# Patient Record
Sex: Female | Born: 1980 | Race: White | Hispanic: No | Smoking: Former smoker
Health system: Southern US, Community
[De-identification: ages and names within clinical notes are randomized; demographics above are authoritative.]

## PROBLEM LIST (undated history)

## (undated) DIAGNOSIS — N289 Disorder of kidney and ureter, unspecified: Secondary | ICD-10-CM

## (undated) DIAGNOSIS — J189 Pneumonia, unspecified organism: Secondary | ICD-10-CM

## (undated) DIAGNOSIS — K219 Gastro-esophageal reflux disease without esophagitis: Secondary | ICD-10-CM

## (undated) DIAGNOSIS — F329 Major depressive disorder, single episode, unspecified: Secondary | ICD-10-CM

## (undated) DIAGNOSIS — F32A Depression, unspecified: Secondary | ICD-10-CM

## (undated) DIAGNOSIS — F410 Panic disorder [episodic paroxysmal anxiety] without agoraphobia: Secondary | ICD-10-CM

## (undated) DIAGNOSIS — K5900 Constipation, unspecified: Secondary | ICD-10-CM

## (undated) DIAGNOSIS — M1990 Unspecified osteoarthritis, unspecified site: Secondary | ICD-10-CM

## (undated) DIAGNOSIS — T7840XA Allergy, unspecified, initial encounter: Secondary | ICD-10-CM

## (undated) DIAGNOSIS — Z87442 Personal history of urinary calculi: Secondary | ICD-10-CM

## (undated) DIAGNOSIS — J302 Other seasonal allergic rhinitis: Secondary | ICD-10-CM

## (undated) HISTORY — DX: Major depressive disorder, single episode, unspecified: F32.9

## (undated) HISTORY — PX: UPPER GI ENDOSCOPY: SHX6162

## (undated) HISTORY — PX: ABDOMINAL HYSTERECTOMY: SHX81

## (undated) HISTORY — PX: OTHER SURGICAL HISTORY: SHX169

## (undated) HISTORY — PX: KNEE ARTHROSCOPY: SHX127

## (undated) HISTORY — DX: Allergy, unspecified, initial encounter: T78.40XA

## (undated) HISTORY — PX: KIDNEY STONE SURGERY: SHX686

## (undated) HISTORY — PX: TONSILLECTOMY: SUR1361

## (undated) HISTORY — PX: TONSILLECTOMY AND ADENOIDECTOMY: SHX28

## (undated) HISTORY — PX: DILATION AND CURETTAGE OF UTERUS: SHX78

## (undated) HISTORY — PX: PARTIAL HYSTERECTOMY: SHX80

## (undated) HISTORY — PX: BREAST CYST EXCISION: SHX579

## (undated) HISTORY — PX: COLONOSCOPY: SHX174

## (undated) HISTORY — DX: Constipation, unspecified: K59.00

## (undated) HISTORY — DX: Gastro-esophageal reflux disease without esophagitis: K21.9

## (undated) HISTORY — DX: Depression, unspecified: F32.A

## (undated) HISTORY — PX: TUBAL LIGATION: SHX77

---
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
## 2002-09-26 ENCOUNTER — Other Ambulatory Visit: Admission: RE | Admit: 2002-09-26 | Discharge: 2002-09-26 | Payer: Self-pay | Admitting: Obstetrics and Gynecology

## 2002-10-04 ENCOUNTER — Encounter: Payer: Self-pay | Admitting: Orthopedic Surgery

## 2002-10-04 ENCOUNTER — Ambulatory Visit (HOSPITAL_COMMUNITY): Admission: RE | Admit: 2002-10-04 | Discharge: 2002-10-04 | Payer: Self-pay | Admitting: Orthopedic Surgery

## 2002-10-21 ENCOUNTER — Ambulatory Visit (HOSPITAL_COMMUNITY): Admission: RE | Admit: 2002-10-21 | Discharge: 2002-10-21 | Payer: Self-pay | Admitting: Obstetrics and Gynecology

## 2002-10-21 ENCOUNTER — Encounter: Payer: Self-pay | Admitting: Obstetrics and Gynecology

## 2002-10-24 ENCOUNTER — Ambulatory Visit (HOSPITAL_COMMUNITY): Admission: RE | Admit: 2002-10-24 | Discharge: 2002-10-24 | Payer: Self-pay | Admitting: Obstetrics and Gynecology

## 2002-10-24 ENCOUNTER — Encounter: Payer: Self-pay | Admitting: Obstetrics and Gynecology

## 2002-11-10 ENCOUNTER — Ambulatory Visit (HOSPITAL_BASED_OUTPATIENT_CLINIC_OR_DEPARTMENT_OTHER): Admission: RE | Admit: 2002-11-10 | Discharge: 2002-11-10 | Payer: Self-pay | Admitting: Urology

## 2002-11-10 ENCOUNTER — Encounter: Payer: Self-pay | Admitting: Urology

## 2003-05-05 ENCOUNTER — Inpatient Hospital Stay (HOSPITAL_COMMUNITY): Admission: AD | Admit: 2003-05-05 | Discharge: 2003-05-05 | Payer: Self-pay | Admitting: Family Medicine

## 2003-05-08 ENCOUNTER — Inpatient Hospital Stay (HOSPITAL_COMMUNITY): Admission: AD | Admit: 2003-05-08 | Discharge: 2003-05-08 | Payer: Self-pay

## 2003-05-10 ENCOUNTER — Inpatient Hospital Stay (HOSPITAL_COMMUNITY): Admission: AD | Admit: 2003-05-10 | Discharge: 2003-05-10 | Payer: Self-pay | Admitting: Obstetrics and Gynecology

## 2003-06-29 ENCOUNTER — Ambulatory Visit (HOSPITAL_COMMUNITY): Admission: RE | Admit: 2003-06-29 | Discharge: 2003-06-29 | Payer: Self-pay | Admitting: Obstetrics and Gynecology

## 2003-08-10 ENCOUNTER — Ambulatory Visit (HOSPITAL_COMMUNITY): Admission: RE | Admit: 2003-08-10 | Discharge: 2003-08-10 | Payer: Self-pay | Admitting: Obstetrics and Gynecology

## 2003-10-06 ENCOUNTER — Ambulatory Visit (HOSPITAL_COMMUNITY): Admission: RE | Admit: 2003-10-06 | Discharge: 2003-10-06 | Payer: Self-pay | Admitting: Obstetrics and Gynecology

## 2003-10-09 ENCOUNTER — Inpatient Hospital Stay (HOSPITAL_COMMUNITY): Admission: AD | Admit: 2003-10-09 | Discharge: 2003-10-09 | Payer: Self-pay | Admitting: Obstetrics and Gynecology

## 2003-10-24 ENCOUNTER — Encounter: Admission: RE | Admit: 2003-10-24 | Discharge: 2003-10-24 | Payer: Self-pay | Admitting: Obstetrics and Gynecology

## 2003-11-08 ENCOUNTER — Ambulatory Visit (HOSPITAL_COMMUNITY): Admission: RE | Admit: 2003-11-08 | Discharge: 2003-11-08 | Payer: Self-pay | Admitting: Obstetrics and Gynecology

## 2003-11-20 ENCOUNTER — Ambulatory Visit (HOSPITAL_COMMUNITY): Admission: RE | Admit: 2003-11-20 | Discharge: 2003-11-20 | Payer: Self-pay | Admitting: Obstetrics and Gynecology

## 2003-12-02 ENCOUNTER — Observation Stay (HOSPITAL_COMMUNITY): Admission: AD | Admit: 2003-12-02 | Discharge: 2003-12-03 | Payer: Self-pay | Admitting: Obstetrics and Gynecology

## 2003-12-06 ENCOUNTER — Inpatient Hospital Stay (HOSPITAL_COMMUNITY): Admission: AD | Admit: 2003-12-06 | Discharge: 2003-12-08 | Payer: Self-pay | Admitting: Obstetrics and Gynecology

## 2004-01-19 ENCOUNTER — Other Ambulatory Visit: Admission: RE | Admit: 2004-01-19 | Discharge: 2004-01-19 | Payer: Self-pay | Admitting: Obstetrics and Gynecology

## 2004-02-22 ENCOUNTER — Emergency Department (HOSPITAL_COMMUNITY): Admission: EM | Admit: 2004-02-22 | Discharge: 2004-02-22 | Payer: Self-pay | Admitting: Emergency Medicine

## 2004-03-15 ENCOUNTER — Emergency Department (HOSPITAL_COMMUNITY): Admission: EM | Admit: 2004-03-15 | Discharge: 2004-03-15 | Payer: Self-pay | Admitting: Emergency Medicine

## 2004-08-27 ENCOUNTER — Inpatient Hospital Stay (HOSPITAL_COMMUNITY): Admission: AD | Admit: 2004-08-27 | Discharge: 2004-08-27 | Payer: Self-pay | Admitting: *Deleted

## 2004-08-28 ENCOUNTER — Emergency Department (HOSPITAL_COMMUNITY): Admission: EM | Admit: 2004-08-28 | Discharge: 2004-08-28 | Payer: Self-pay | Admitting: Emergency Medicine

## 2004-09-16 ENCOUNTER — Emergency Department (HOSPITAL_COMMUNITY): Admission: EM | Admit: 2004-09-16 | Discharge: 2004-09-16 | Payer: Self-pay | Admitting: Emergency Medicine

## 2004-10-03 ENCOUNTER — Emergency Department (HOSPITAL_COMMUNITY): Admission: EM | Admit: 2004-10-03 | Discharge: 2004-10-03 | Payer: Self-pay | Admitting: *Deleted

## 2004-12-21 ENCOUNTER — Emergency Department (HOSPITAL_COMMUNITY): Admission: EM | Admit: 2004-12-21 | Discharge: 2004-12-21 | Payer: Self-pay | Admitting: Emergency Medicine

## 2005-01-28 ENCOUNTER — Ambulatory Visit: Payer: Self-pay | Admitting: Internal Medicine

## 2005-05-14 ENCOUNTER — Emergency Department (HOSPITAL_COMMUNITY): Admission: EM | Admit: 2005-05-14 | Discharge: 2005-05-14 | Payer: Self-pay | Admitting: Emergency Medicine

## 2005-09-05 ENCOUNTER — Emergency Department (HOSPITAL_COMMUNITY): Admission: EM | Admit: 2005-09-05 | Discharge: 2005-09-05 | Payer: Self-pay | Admitting: Family Medicine

## 2006-03-02 ENCOUNTER — Emergency Department (HOSPITAL_COMMUNITY): Admission: EM | Admit: 2006-03-02 | Discharge: 2006-03-02 | Payer: Self-pay | Admitting: Family Medicine

## 2006-03-07 ENCOUNTER — Emergency Department (HOSPITAL_COMMUNITY): Admission: EM | Admit: 2006-03-07 | Discharge: 2006-03-07 | Payer: Self-pay | Admitting: Family Medicine

## 2006-12-31 ENCOUNTER — Emergency Department (HOSPITAL_COMMUNITY): Admission: EM | Admit: 2006-12-31 | Discharge: 2006-12-31 | Payer: Self-pay | Admitting: Family Medicine

## 2007-08-20 ENCOUNTER — Emergency Department (HOSPITAL_COMMUNITY): Admission: EM | Admit: 2007-08-20 | Discharge: 2007-08-20 | Payer: Self-pay | Admitting: Emergency Medicine

## 2008-02-08 ENCOUNTER — Emergency Department (HOSPITAL_COMMUNITY): Admission: EM | Admit: 2008-02-08 | Discharge: 2008-02-08 | Payer: Self-pay | Admitting: Family Medicine

## 2008-06-19 ENCOUNTER — Inpatient Hospital Stay (HOSPITAL_COMMUNITY): Admission: AD | Admit: 2008-06-19 | Discharge: 2008-06-20 | Payer: Self-pay | Admitting: Obstetrics & Gynecology

## 2008-09-09 ENCOUNTER — Emergency Department (HOSPITAL_COMMUNITY): Admission: EM | Admit: 2008-09-09 | Discharge: 2008-09-10 | Payer: Self-pay | Admitting: Emergency Medicine

## 2009-08-24 ENCOUNTER — Emergency Department (HOSPITAL_COMMUNITY): Admission: EM | Admit: 2009-08-24 | Discharge: 2009-08-24 | Payer: Self-pay | Admitting: Family Medicine

## 2010-03-05 ENCOUNTER — Emergency Department (HOSPITAL_COMMUNITY): Admission: EM | Admit: 2010-03-05 | Discharge: 2010-03-05 | Payer: Self-pay | Admitting: Family Medicine

## 2010-06-07 ENCOUNTER — Other Ambulatory Visit: Admission: RE | Admit: 2010-06-07 | Discharge: 2010-06-07 | Payer: Self-pay | Admitting: Family Medicine

## 2010-09-21 ENCOUNTER — Inpatient Hospital Stay (HOSPITAL_COMMUNITY)
Admission: AD | Admit: 2010-09-21 | Discharge: 2010-09-21 | Disposition: A | Discharge status: Home / Self Care | Payer: BC Managed Care – PPO | Source: Ambulatory Visit | Attending: Obstetrics & Gynecology | Admitting: Obstetrics & Gynecology

## 2010-09-21 ENCOUNTER — Inpatient Hospital Stay (HOSPITAL_COMMUNITY): Payer: BC Managed Care – PPO

## 2010-09-21 DIAGNOSIS — R109 Unspecified abdominal pain: Secondary | ICD-10-CM | POA: Insufficient documentation

## 2010-09-21 DIAGNOSIS — O200 Threatened abortion: Secondary | ICD-10-CM

## 2010-09-21 LAB — CBC
MCH: 30.7 pg (ref 26.0–34.0)
MCV: 92.8 fL (ref 78.0–100.0)
Platelets: 214 10*3/uL (ref 150–400)
RBC: 4.33 MIL/uL (ref 3.87–5.11)
RDW: 12.2 % (ref 11.5–15.5)
WBC: 7.5 10*3/uL (ref 4.0–10.5)

## 2010-09-21 LAB — URINALYSIS, ROUTINE W REFLEX MICROSCOPIC
Bilirubin Urine: NEGATIVE
Hgb urine dipstick: NEGATIVE
Nitrite: NEGATIVE
Specific Gravity, Urine: 1.025 (ref 1.005–1.030)
Urobilinogen, UA: 0.2 mg/dL (ref 0.0–1.0)
pH: 6 (ref 5.0–8.0)

## 2010-09-21 LAB — WET PREP, GENITAL
Clue Cells Wet Prep HPF POC: NONE SEEN
Trich, Wet Prep: NONE SEEN
Yeast Wet Prep HPF POC: NONE SEEN

## 2010-09-21 LAB — ABO/RH: ABO/RH(D): A POS

## 2010-09-23 ENCOUNTER — Inpatient Hospital Stay (HOSPITAL_COMMUNITY)
Admission: AD | Admit: 2010-09-23 | Discharge: 2010-09-23 | Disposition: A | Discharge status: Home / Self Care | Payer: BC Managed Care – PPO | Source: Ambulatory Visit | Attending: Family Medicine | Admitting: Family Medicine

## 2010-09-23 DIAGNOSIS — O209 Hemorrhage in early pregnancy, unspecified: Secondary | ICD-10-CM | POA: Insufficient documentation

## 2010-09-23 LAB — HCG, QUANTITATIVE, PREGNANCY: hCG, Beta Chain, Quant, S: 124 m[IU]/mL — ABNORMAL HIGH (ref <5)

## 2010-09-25 LAB — GC/CHLAMYDIA PROBE AMP, GENITAL: Chlamydia, DNA Probe: NEGATIVE

## 2010-09-25 LAB — POCT RAPID STREP A (OFFICE): Streptococcus, Group A Screen (Direct): NEGATIVE

## 2010-09-26 ENCOUNTER — Inpatient Hospital Stay (HOSPITAL_COMMUNITY)
Admission: AD | Admit: 2010-09-26 | Discharge: 2010-09-26 | Disposition: A | Discharge status: Home / Self Care | Payer: BC Managed Care – PPO | Source: Ambulatory Visit | Attending: Obstetrics and Gynecology | Admitting: Obstetrics and Gynecology

## 2010-09-26 DIAGNOSIS — O200 Threatened abortion: Secondary | ICD-10-CM | POA: Insufficient documentation

## 2010-09-26 LAB — CBC
HCT: 38.7 % (ref 36.0–46.0)
Hemoglobin: 12.6 g/dL (ref 12.0–15.0)
MCV: 93.5 fL (ref 78.0–100.0)
RBC: 4.14 MIL/uL (ref 3.87–5.11)
WBC: 6 10*3/uL (ref 4.0–10.5)

## 2010-10-04 ENCOUNTER — Inpatient Hospital Stay (HOSPITAL_COMMUNITY)
Admission: AD | Admit: 2010-10-04 | Discharge: 2010-10-04 | Disposition: A | Discharge status: Home / Self Care | Payer: BC Managed Care – PPO | Source: Ambulatory Visit | Attending: Obstetrics & Gynecology | Admitting: Obstetrics & Gynecology

## 2010-10-04 ENCOUNTER — Other Ambulatory Visit: Payer: Self-pay | Admitting: Obstetrics and Gynecology

## 2010-10-04 DIAGNOSIS — N61 Mastitis without abscess: Secondary | ICD-10-CM

## 2010-10-04 DIAGNOSIS — Z1231 Encounter for screening mammogram for malignant neoplasm of breast: Secondary | ICD-10-CM

## 2010-10-04 DIAGNOSIS — O039 Complete or unspecified spontaneous abortion without complication: Secondary | ICD-10-CM | POA: Insufficient documentation

## 2010-10-04 DIAGNOSIS — Z803 Family history of malignant neoplasm of breast: Secondary | ICD-10-CM

## 2010-10-04 LAB — HCG, QUANTITATIVE, PREGNANCY: hCG, Beta Chain, Quant, S: 64 m[IU]/mL — ABNORMAL HIGH (ref <5)

## 2010-10-18 ENCOUNTER — Ambulatory Visit
Admission: RE | Admit: 2010-10-18 | Discharge: 2010-10-18 | Disposition: A | Discharge status: Home / Self Care | Payer: BC Managed Care – PPO | Source: Ambulatory Visit | Attending: Obstetrics and Gynecology | Admitting: Obstetrics and Gynecology

## 2010-10-18 ENCOUNTER — Other Ambulatory Visit: Payer: Self-pay | Admitting: Obstetrics and Gynecology

## 2010-10-18 DIAGNOSIS — Z803 Family history of malignant neoplasm of breast: Secondary | ICD-10-CM

## 2010-10-18 DIAGNOSIS — N61 Mastitis without abscess: Secondary | ICD-10-CM

## 2010-11-07 ENCOUNTER — Ambulatory Visit (HOSPITAL_COMMUNITY)
Admission: RE | Admit: 2010-11-07 | Discharge: 2010-11-08 | Disposition: A | Discharge status: Home / Self Care | Payer: BC Managed Care – PPO | Source: Ambulatory Visit | Attending: Obstetrics and Gynecology | Admitting: Obstetrics and Gynecology

## 2010-11-07 ENCOUNTER — Other Ambulatory Visit: Payer: Self-pay | Admitting: Obstetrics and Gynecology

## 2010-11-07 DIAGNOSIS — N920 Excessive and frequent menstruation with regular cycle: Secondary | ICD-10-CM | POA: Insufficient documentation

## 2010-11-07 DIAGNOSIS — Y9210 Unspecified residential institution as the place of occurrence of the external cause: Secondary | ICD-10-CM | POA: Insufficient documentation

## 2010-11-07 DIAGNOSIS — IMO0002 Reserved for concepts with insufficient information to code with codable children: Secondary | ICD-10-CM | POA: Insufficient documentation

## 2010-11-07 DIAGNOSIS — Z302 Encounter for sterilization: Secondary | ICD-10-CM | POA: Insufficient documentation

## 2010-11-07 LAB — CBC
HCT: 40.8 % (ref 36.0–46.0)
Hemoglobin: 13.4 g/dL (ref 12.0–15.0)
MCV: 92.7 fL (ref 78.0–100.0)
RBC: 4.4 MIL/uL (ref 3.87–5.11)
RDW: 12.1 % (ref 11.5–15.5)
WBC: 6.6 10*3/uL (ref 4.0–10.5)

## 2010-11-08 LAB — COMPREHENSIVE METABOLIC PANEL
Albumin: 2.7 g/dL — ABNORMAL LOW (ref 3.5–5.2)
Alkaline Phosphatase: 55 U/L (ref 39–117)
BUN: 9 mg/dL (ref 6–23)
CO2: 25 mEq/L (ref 19–32)
Chloride: 104 mEq/L (ref 96–112)
Creatinine, Ser: 0.71 mg/dL (ref 0.4–1.2)
GFR calc non Af Amer: >60 mL/min (ref >60)
Potassium: 4 mEq/L (ref 3.5–5.1)
Total Bilirubin: 0.4 mg/dL (ref 0.3–1.2)

## 2010-11-08 LAB — CBC
HCT: 31.8 % — ABNORMAL LOW (ref 36.0–46.0)
Hemoglobin: 10.3 g/dL — ABNORMAL LOW (ref 12.0–15.0)
MCH: 30.4 pg (ref 26.0–34.0)
MCV: 93.8 fL (ref 78.0–100.0)
Platelets: 147 10*3/uL — ABNORMAL LOW (ref 150–400)
RBC: 3.39 MIL/uL — ABNORMAL LOW (ref 3.87–5.11)
WBC: 6.8 10*3/uL (ref 4.0–10.5)

## 2010-11-14 NOTE — Op Note (Signed)
NAMEJORDYAN, HARDIMAN    ACCOUNT NO.:  [id number]  MEDICAL RECORD NO.:  [id number]           PATIENT TYPE:  O  LOCATION:  9320                          FACILITY:  [facility]  PHYSICIAN:  Patsy Baltimore, MD     DATE OF BIRTH:  [birth year]  DATE OF PROCEDURE:  11/07/2010 DATE OF DISCHARGE:                              OPERATIVE REPORT   PREOPERATIVE DIAGNOSES: 1. Satisfied parity. 2. Menorrhagia.  POSTOPERATIVE DIAGNOSES: 1. Satisfied parity. 2. Menorrhagia. 3. Uterine perforation.  PROCEDURES PERFORMED: 1. Adiana permanent contraception. 2. NovaSure endometrial ablation aborted due to perforation of uterine     fundus. 3. Diagnostic laparoscopy.  SURGEON:  Patsy Baltimore, MD  ANESTHESIA:  General.  SPECIMENS SENT:  Endometrial curettings.  [name] is a 30 year old para 4 who presents for the Adiana permanent contraception and NovaSure endometrial ablation. She has four children and confirmed that she has satisfied parity and wanted a method of permanent sterilization.  The Adiana procedure had been reviewed with her.  The risks, benefits and alternatives of the procedure had been reviewed.  The patient verbalized understanding that she would need to have a HSG done 3 months after the procedure to verify tubal occlusion.  She also accepted the risk of pain, bleeding, infection, damage to the surrounding structures including her bladder and her bowel.  We reviewed also the NovaSure endometrial ablation and all the risks involved and she gave her informed consent.  She received cefotetan for antibiotic prophylaxis.  She had TEDs and SCDs.  She is allergic to MORPHINE and MACROBID.  She was taken to the operating room with IV fluids running.  She was put under general anesthesia and we began the procedure.  Her perineum and vagina were prepped and draped in the usual sterile fashion.  A time-out was called.  A speculum was inserted into the vagina.   A single-toothed tenaculum was used to grasp the anterior lip of the cervix.  The cervix was measured at 4.5 cm and the total uterine length was 10 cm giving a cavity length of 5.5 cm. The cervix was then dilated to accommodate the 5-mm hysteroscope.  Initial inspection revealed that the endometrial cavity was quite fluffy with a lot of tissue.  There was some debris, which was removed to improve visualization of the tubal ostia.  The distending medium used at this time was 1.5% glycine.  A blunt curette was then used to remove the tissue in the endometrium and this was sent off as endometrial curettings.  The hysteroscope was then reinserted and again both tubal ostia were visualized.  Beginning on the left side, the Adiana contraceptive device was fed through the feeding channel on the hysteroscope.  The device was inserted into the left tube.  The beeps were heard to verify contact with the four quadrants.  Following this, the device was engaged and I held in still with no motion for 60 seconds.  After that, the gray buttonon the device was pressed to withdrawal the device.  The same was repeated on the right side.  Once this was done successfully, the hysteroscope was then reinserted into the uterine cavity.  The 1.5% glycine bag was  then switched out to lactated Ringer's.  This was flushed out for 1.7minutes.  Then, the hysteroscope was removed.  The cervix was further dilated to accommodate the NovaSure device.  The uterine length was set at 5.5 and the width was determined to be 4.7.  The first cavity assessment was done and the device did not pass cavity assessment, the NovaSure device was then removed and repositioned a couple of times and we still did not pass cavity.  At this point, the Novasure device was removed completely.  The hysteroscope was reinserted and it was at this point of inspection that a fundal perforation was detected.  There was no herniation of bowel or other  intra-abdominal contents through the perforation.  It was seen as a hole in the uterine lining.  At this point, a decision was made to proceed with a diagnostic laparoscopy to inspect the lower bowel segments and make sure that there was no damage done proximally. I stepped out of the room to update her pastor and grandmother in the waiting room of the situation. The patient was repositioned, prepped, and draped.  We rescrubbed and another time-out was called for this diagnostic laparoscopy.  Two Allis clamps were placed on either side of the umbilicus and the skin incision was made enough to accommodate the 11-mm scope.  The trocar was then inserted under direct visualization.  Once entry inside the peritoneal cavity was confirmed, the CO2 gas tubing was connected, the opening pressure was 4 and then we had rapid insufflation of CO2 gas.  Initial inspection of the bowel and omentum revealed that there was no bleeding or evidence of perforation on entry.  The patient was then put in Trendelenburg position.  A 5-mm trocar was placed under direct visualization at the left lower quadrant.  There was no evidence of vessel or bowel injury as the trocar was inserted under direct visualization and there was no bleeding around the trocar insertion point.  A blunt-tipped grasper was then used to elevate the uterus.  The perforation was seen posteriorly towards the fundus.  There was some blood in the cul-de-sac and this was suctioned and irrigated.  The posterior fundal uterine perforation was grasped also with the Kleppingers and cauterized briefly, it was hemostatic thereafter.  I watched it for a few minutes just to ensure that the bleeding did not persist, then irrigated.  The bowel was inspected towards the sigmoid and rectum to ensure that there was no involvement or injury.  There was no evidence of bowel injury based on this inspection.  The laparoscopy was then concluded.  The gas was  removed.  The trocar on the left lower quadrant was removed under direct visualization.  The fascia was reapproximated in the umbilicus and the skin was closed in a subcuticular manner.  Dermabond was used to close the skin completely.  The tenaculum and speculum in the vagina were then removed.  Silver nitrate was used to cauterize the tenaculum puncture site.  We had excellent hemostasis. The patient was transferred to the PACU in stable condition.  She will be kept overnight for observation.  She has been appraised of the intraoperative complication of the uterine perforation and the failed NovaSure ablation, as well as her family in the waiting area. She will be kept overnight for observation. The sponge and instrument counts were correct x2.          ______________________________ Patsy Baltimore, MD     CO/MEDQ  D:  11/07/2010  T:  11/08/2010  Job:  [id number]  Electronically Signed by Patsy Baltimore MD on 11/14/2010 11:02:51 AM

## 2010-11-22 NOTE — Op Note (Signed)
NAMETAMMARA, MASSING                         ACCOUNT NO.:  [id number]   MEDICAL RECORD NO.:  [id number]                   PATIENT TYPE:  INP   LOCATION:  9172                                 [facility]ACILITY:  [facility]   PHYSICIAN:  [name] A. Dillard, M.D.              DATE OF BIRTH:  [birth year]   DATE OF PROCEDURE:  12/06/2003  DATE OF DISCHARGE:                                 OPERATIVE REPORT   DELIVERY NOTE:  It is currently 12:40 on December 06, 2003.  I was called to see  [name] [name] secondary to the baby having deep variables to the 60s and 90s  lasting about 40 seconds with good recovery.  The patient was examined and  found to be completely dilated and at +1 station.  The patient was given  oxygen and scalp stimulation and fetal heart tones returned to a normal  baseline.  The patient was then transported to the OR.  Fetal heart tones  remained at a normal baseline.  The patient then pushed and delivered a  female infant in vertex presentation with Apgars of 7 and 9 and a weight of  4 pounds 13 ounces at 11:57 a.m.  Head delivered to perineum, nuchal cord x1  was easily reduced.  The body delivered without difficulty.  Cord was  clamped and cut and handed to the waiting pediatricians.  Cord blood was  then obtained.  An ultrasound was placed on the abdomen and baby B was found  to be in frank breech presentation with a heart rate of 130s, and the  discussion before we even attempted delivery after delivery of baby A, the  patient was told that she has an option of external cephalic version, the  risks being but not limited to bleeding, infection, rupture of membranes,  and fetal distress and also fetal injury versus a breech delivery, which  could lead to infant's morbidity and head entrapment, versus a C-section, in  which the risks of bleeding, infection, and damage to internal organs such  as bowel, bladder, or major blood vessels, need for further C-sections in  other deliveries, and  hysterectomy.  This was reviewed in detail with the  mother.  The patient stated that she wanted and external cephalic version  attempt and if unsuccessful would attempt to have a vaginal delivery because  the second twin was noted to always be smaller than the first on routine  ultrasound growth scans.  During my attempt at external cephalic version,  the infant tolerated it well.  Fetal heart tones stayed at 130; however, the  membranes spontaneously ruptured and the infant is then delivered in frank  breech presentation with normal breech maneuvers spontaneously, the head  delivered without difficulty.  There was no nuchal cord or head entrapment,  and clear fluid.  The infant delivered at 12:06 with Apgars of 2 and 8 and a  weight of 3 pounds 12 ounces.  The infant went to the NICU secondary to  weight.  Placenta spontaneously delivered intact.  The cord of baby A was  very gelatinous and almost see-through, but had three vessels.  The cord of  baby B also had three vessels.  The placenta was sent to pathology.  Estimated blood loss was 300 mL.  There were no perineal lacerations noted.  The patient went to recovery in stable condition.                                               [name] [name], M.D.    NAD/MEDQ  D:  12/06/2003  T:  12/06/2003  Job:  [id number]

## 2010-11-22 NOTE — H&P (Signed)
NAMEZAKARA, PARKEY                         ACCOUNT NO.:  [id number]   MEDICAL RECORD NO.:  [id number]                   PATIENT TYPE:  INP   LOCATION:  9172                                 [facility]ILITY:  [facility]   PHYSICIAN:  Osborn Coho, M.D.                DATE OF BIRTH:  1981/03/22   DATE OF ADMISSION:  12/06/2003  DATE OF DISCHARGE:                                HISTORY & PHYSICAL   HISTORY OF PRESENT ILLNESS:  [name] is a 30 year old gravida 3, para 2-  0-0-2 at 34-5/7 weeks by six-week ultrasound and in agreement with  ultrasound subsequently done during the pregnancy;  twin gestation.  She  presented with spontaneous rupture of membranes at approximately 6 a.m. with  clear fluid.  She reports irregular mild contractions.  She has been 2 cm,  70% over the weekend.  She was seen in maternity admissions on Dec 03, 2003  for preterm contractions and was noted to have a positive fetal fibronectin,  a negative group B strep and cultures.  Last growth ultrasound was on Nov 08, 2003 with normal fluid and growth, and a vertex-vertex presentation.   PREGNANCY RISKS:  The pregnancy has been remarkable for:  1. Twin gestation.  2. Gestational diabetes, diet controlled.  3. History of postpartum depression.  4. History of kidney stones, with lithotripsy on April 2004.  5. Endometriosis.  6. Conception on OCT's.  7. Asthma.   PRENATAL LABS:  Blood type A positive.  Rh antibody negative.  VDRL  nonreactive.  Rubella titer positive.  Hepatitis B surface antigen negative.  HIV nonreactive.  GC and chlamydia cultures were negative at the first  visit, as well as negative at 35 weeks.  Cystic fibrosis testing was  negative.  Pap was done and was normal.  Hemoglobin upon entering into  practice was 13.2; it was 10.2 at 26 weeks.  Group B strep culture and GC  and chlamydia cultures were negative at 35 weeks.  Fetal fibronectin was  positive at 35 weeks.  EDC of January 12, 2004 was  established by six-week  ultrasound and with the ultrasound subsequently done during the pregnancy.  AFP was declined.   HISTORY OF PRESENT PREGNANCY:  The patient entered care at approximately  eight weeks.  Twin gestation had been identified at six weeks.  She had  another ultrasound at 12 weeks showing a membrane seen between.  She did  have some cramping at 14 weeks, for which she was evaluated and placed on  ibuprofen.  She also has had some difficulty with constipation.  She had a  viral syndrome at 14 weeks.  She had another ultrasound at 18 weeks showing  twin A breech and twin B vertex, with normal fluid and anatomy.  Cervix was  4 cm at that time.  She was evaluated at 20 weeks for possible kidney stone  versus UTI.  She was  placed on Septra that did resolve the issue.  She had  questionable leaking at 26 weeks, with no findings of leaking.  She did have  a negative fetal fibronectin on October 09, 2003.  She was placed on  terbutaline for contractions.  She had an ultrasound on October 06, 2003  showing normal growth and development, with both fetuses in the 50-75th  percentile, and cervix 4.1 cm.  She had an elevated Glucola of 203 at 27  weeks.  She was referred for diabetic diet teaching.  Her sugars have been  in good control, per patient's report.  At 30 weeks she had another  ultrasound showing growth at the 50-75th percentile for twin A and 25-50th  percentile on twin B.  Both were vertex and both with normal fluid.  Cervix  was 3.4 cm.  She was also placed on Keflex for UTI.   The rest of her pregnancy was essentially uncomplicated, although she was  seen in maternity admissions on Dec 03, 2003 for preterm contractions.  She  was given terbutaline and was noted to have a positive fetal fibronectin but  negative other cultures.   OBSTETRICAL HISTORY:  1. Vaginal birth of female, 84; weight 6 pounds 5 ounces at 36 weeks.  She     was in labor for 7 hours.  She had epidural  anesthesia.  That child was     [place]  The patient fell on the ice and her     water broke. The baby had no difficulties.  2. Vaginal birth of female infant, 65; weight 6 pounds 4 ounces at 37     weeks.  She was in labor 7 hours.  She had epidural anesthesia.  That     child was also born in [place], [place].  After that delivery     she did have postpartum depression and was on Zoloft.   PAST MEDICAL HISTORY:  1. Conceived on [place].  2. In 2002 she had a diagnostic laparoscopy and found endometriosis.  3. At age 89 she was treated for chlamydia.  4. Reports the usual childhood illnesses.  5. History of yeast infections.  6. History of asthma and uses an inhaler occasionally.  7. History of UTIs and one kidney stone, with lithotripsy done April 2004.   PAST SURGICAL HISTORY:  1. Wisdom teeth removed in 2003.  2. Molars removed as a teenager.  3. In 2002 diagnostic laparoscopy.  4. April 2004 kidney stones with lithotripsy.   ALLERGIES:  MACROBID (causes hives and breathing problems), MORPHINE (Causes  nausea and vomiting).   FAMILY HISTORY:  Her mother has hypertension.  Maternal grandmother has  diabetes.  Father had prostate cancer.  Father's side of the family smokes.   SOCIAL HISTORY:  The patient is single; father of the baby is involved and  supportive (name, [name] [name]).  This is a new partner.  The patient is  also in the process of a divorce from her previous partner.  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  Her partner also works in [employer], as does the patient.  She has been followed by the physician  services of [address].  She denies any alcohol, drug or tobacco use  during this pregnancy.   PHYSICAL EXAMINATION:  VITAL SIGNS:  Stable.  The patient is afebrile.  HEENT:  Within normal limits.  LUNGS:  Bilateral breath sounds are clear. HEART:  Regular  rate and rhythm without murmur  or rub.  BREASTS:  Soft and nontender.  ABDOMEN:  Fundal height is approximately 39 cm.  Bedside ultrasound shows  fetuses to be vertex-vertex. The patient is noted to be leaking copious  amounts of clear fluid.  FETAL MONITORING:  Fetal heart rate is reassuring x2, with no decelerations.  Uterine contractions are every 2-3 min; mild quality.  PELVIC:  Cervical examination -- Posterior, 2 cm, 70% vertex at a minus one  station.  EXTREMITIES:  Deep tendon reflexes are 2+ without clonus.  There is a trace  edema noted.   IMPRESSION:  1. Intrauterine pregnancy at 34-5/7 weeks, with twin gestation.  2. Spontaneous rupture of membranes.  3. Gestational diabetes, diet controlled.  4. Negative group B strep.   PLAN:  1. Admit to birthing suite for consult with Dr. Su Hilt as attending     physician.  The patient is currently holding in maternity admissions.  2. Routine physician orders.  3. Physicians will follow.  4. NICU will be updated.  5. Serum glucose will be done with new OB labs, with further diabetic     management per M.D. decisions.     Renaldo Reel Emilee Hero, C.N.M.                   Osborn Coho, M.D.    VLL/MEDQ  D:  12/06/2003  T:  12/06/2003  Job:  [id number]

## 2010-11-22 NOTE — H&P (Signed)
[name], [name]                         ACCOUNT NO.:  [id number]   MEDICAL RECORD NO.:  [id number]                   PATIENT TYPE:  INP   LOCATION:  9162                                 [facility]   PHYSICIAN:  [name] [name], M.D.              DATE OF BIRTH:  [birth year]   DATE OF ADMISSION:  12/02/2003  DATE OF DISCHARGE:                                HISTORY & PHYSICAL   HISTORY OF PRESENT ILLNESS:  This is a 30 year old gravida 3, para 2-0-0-2  at 34-2/7 weeks with a twin gestation, who presents with preterm  contractions while they which have become worse this evening.  She received  2 doses of terbutaline in the Maternity Admissions Unit and continued to  contract and she is therefore being admitted for 23-hour observation and  therapeutic rest to assess for labor.  She denies leaking or bleeding and  reports positive fetal movement.  Her pregnancy has been followed by the  M.D. service and remarkable for:  #1 - Twin gestation, #2 - unsure date, #3  - asthma, #4 - endometriosis, #5 - kidney stones, #6 - history of postpartum  depression, #7 - history of 36-week delivery, #8 - gestational diabetes.   OBSTETRICAL HISTORY:  OB history is remarkable for vaginal delivery in 2000  of a female at 40 weeks' gestation weighing 6 pounds 5 ounces, remarkable for  preterm premature rupture of membranes.  She had a vaginal delivery in 2001  of a female infant at 44 weeks' gestation weighing 6 pounds 4 ounces,  unremarkable.   MEDICAL HISTORY:  Medical history is remarkable for:  1. Postpartum depression after her second delivery.  2. History of endometriosis.  3. She had a case of Chlamydia at age 27 which was treated.  4. History of yeast infections.  5. Childhood varicella.  6. History of asthma for which she uses an inhaler.  7. History of kidney stone.   FAMILY HISTORY:  Family history is remarkable for mother with hypertension,  grandmother with diabetes, grandfather  with prostate cancer.   SURGICAL HISTORY:  Surgical history is remarkable for wisdom teeth, molars  removed, diagnostic laparoscopy and lithotripsy.   GENETIC HISTORY:  Genetic history is unremarkable.   SOCIAL HISTORY:  The patient is single but involved with [name] [name],  who is involved and supportive.  She works as a Child psychotherapist.  She does not  report a religious affiliation.  She denies any alcohol, tobacco or drug  use.   PRENATAL LABORATORIES:  Hemoglobin 13.2, platelets 228,000.  Blood type A-  positive, antibody screen negative.  RPR, hepatitis and HIV all negative.  Rubella immune.  Gonorrhea and Chlamydia negative.  Cystic fibrosis  negative.   OBJECTIVE DATA:  VITAL SIGNS:  Afebrile.  HEENT:  Within normal limits.  NECK:  Thyroid normal, not enlarged.  CHEST:  Chest clear to auscultation.  HEART:  Heart rate regular rate and  rhythm.  ABDOMEN:  Abdomen gravid with twin gestation.  EFM shows reactive fetal  heart rates on both twins. Uterine contractions are 2-6 minutes with  intermittent periods of every-10-minute contractions.  PELVIC:  Cervix is 2 cm, 75% effaced, -1 station, vertex presentation, which  is unchanged from Dr. Marlowe [name]. [name] [name]'s exam earlier this week.  She had  no pooling, no nitrazine.  Fetal fibronectin, gonorrhea, Chlamydia and group  B strep were all done and are pending.  EXTREMITIES:  Extremities within normal limits.   ASSESSMENT:  1. Intrauterine pregnancy at 34-2/7 weeks.  2. Twin gestation.  3. Preterm contractions.   PLAN:  1. Admit to antenatal for 23-hour observation.  2. Bedrest with bathroom privileges.  3. Terbutaline 2.5 mg p.o. q.4 h.  4. Nubain and Ambien for sleep.  5. Clear liquids.  6. Phenergan p.r.n.  7. Continuous fetal monitoring and further orders to follow.     [name] [name], C.N.M.                 [name] [name], M.D.    MLW/MEDQ  D:  12/03/2003  T:  12/03/2003  Job:  [id number]

## 2010-11-22 NOTE — Op Note (Signed)
[name] [name], [name] [name]    ACCOUNT NO.:  [id number]   MEDICAL RECORD NO.:  [id number]          PATIENT TYPE:  EMS   LOCATION:  ED                           FACILITY:  [facility]   PHYSICIAN:  Whitney Jamaica         DATE OF BIRTH:  1980-11-09   DATE OF PROCEDURE:  DATE OF DISCHARGE:                               OPERATIVE REPORT   INDICATION FOR OPERATION:  A 30 year old gravida 3, para 2, and AB 1,  with chronic pelvic pain and taken to the operating room for diagnostic  laparoscopy to rule out pelvic adhesions and endometriosis.   PREOPERATIVE DIAGNOSIS:  Chronic pelvic pain.   POSTOPERATIVE DIAGNOSES:  1. Chronic pelvic pain.  2. Pelvic adhesions.  3. Endometriosis.   PROCEDURE PERFORMED:  1. Diagnostic laparoscopy.  2. Lysis of pelvic adhesions.  3. Ablation of endometriotic implants.   DESCRIPTION OF OPERATION:  After the patient was adequately counseled,  she was taken to the operating room where she underwent a successful  general endotracheal anesthesia.  Due to her allergy to CEPHALOSPORIN,  she had been given 100 mg of clindamycin IV.  After general endotracheal  anesthesia have been obtained, she was placed on low lithotomy position.  The abdomen, vagina, and perineum were draped in usual sterile fashion.  The patient had Mirena IUD placed in the past, so I wadded and  reinforced with sponge was placed into vagina for additional traction  via a laparoscopic procedure.  After the placement, a small stab  incision was made underneath the umbilicus and with an 11-mm Optiview  trocar inserted into the peritoneal cavity.  After securing placement at  the peritoneal cavity, the pelvic cavity was insufflated with carbon  dioxide at approximately 3 L.  The laparoscope was inserted and second  port was made 2 cm above the symphysis pubis and 5-mm trocar was  inserted.  A systematic evaluation demonstrated normal appearing liver  and gallbladder was very much seen.  The  appendix was not seen.  She had  anterior abdominal wall adhesion which was cauterized with cuff  rongeurs.  The anterior cul-de-sac with no evidence of endometriosis or  pelvic adhesion.  The cul-de-sac both right and left uterosacral  ligament region was scattered, parted, burnt with hemosiderin deposits,  significant for endometriosis and also the under surface of the right  ovarian or ovary.  Both tubes otherwise had the large fibrinated ends,  no other abnormality was noted with the Kleppinger forceps.  These  endometriotic implants were cauterized after both right and left uterus  have been identified to be further away from this area that was to be  cauterized.  Once this was completed, pictures before and after the  procedure were obtained.  The pneumoperitoneum was removed with several  block of fashion.  Incision was closed with figure-of-eight 3-0 Vicryl  suture and the subcuticular stitch with 4-0 plain Tycron was used for  infraumbilical incision.  The edges were approximated as well as  suprapubic incision with Dermabond glue and  0.25% Marcaine was infiltrated both at the incision site for a total 8  cc.  The patient was extubated and  transferred to recovery room with  stable vital signs.  Blood loss was minimal.  Fluid resuscitation  consisted of 600 mL of lactated Ringer's.      Juan H. Lily Peer, M.D.  Electronically Signed     ______________________________  Memory Dance    JHF/MEDQ  D:  10/11/2007  T:  10/11/2007  Job:  [id number]

## 2010-11-22 NOTE — Discharge Summary (Signed)
NAMETONEE, SILVERSTEIN                         ACCOUNT NO.:  [id number]   MEDICAL RECORD NO.:  [id number]                   PATIENT TYPE:  INP   LOCATION:  9129                                 FACILITY:  [facility]   PHYSICIAN:  Crist Fat. Rivard, M.D.              DATE OF BIRTH:  06/06/1981   DATE OF ADMISSION:  12/06/2003  DATE OF DISCHARGE:  12/08/2003                                 DISCHARGE SUMMARY   ADMISSION DIAGNOSES:  1. Intrauterine twin pregnancy at 34-5/7 weeks.  2. Spontaneous rupture of membranes.  3. Gestational diabetes, diet controlled.   DISCHARGE DIAGNOSES:  1. Intrauterine twin gestation.  2. Premature rupture of membranes with labor.  3. Gestational diabetes, diet controlled.   PROCEDURE:  Spontaneous vaginal delivery of twin gestation with twin A  delivered from a vertex presentation and twin B delivered via breech  extraction.   HISTORY OF PRESENT ILLNESS:  Ms. Ronalee Red is a 30 year old gravida 3, para 2-  0-0-2 at 34-5/7 weeks who was admitted on the morning of December 06, 2003 with  spontaneous rupture of membranes at approximately 6 a.m. and clear fluid  noted. She was having irregular contractions initially. Cervix had been 2 cm  with fetuses in the vertex/vertex presentation. She had had a positive fetal  fibronectin on Dec 03, 2003 with a negative group B strep culture. Pregnancy  had been remarkable for the following:  1. Twin gestation.  2. Gestational diabetes, diet controlled.  3. History of postpartum depression.  4. History of kidney stones with lithotripsy in 2004.  5. Endometriosis.  6. Conception on OCP's.  7. Asthma.   HOSPITAL COURSE:  On admission the patient was noted to be still 2 cm, 70%,  vertex and minus 1. Fetuses were still vertex/vertex by bedside ultrasound.  The patient progressed rapidly to completely dilated at which time she began  pushing and delivered a female infant in vertex presentation with Apgar's of  7 and 9, weight 4  pounds 13 ounces at 11:57 a.m. Baby B was found to be in a  breech presentation. The patient desired an external version but would  understand if a vaginal delivery of a breech presentation would need to be  done, she would prefer that versus a cesarean section if possible. During  the attempt of external version, the spontaneous rupture of membranes  occurred. The infant was delivered in a frank breech presentation without  difficulty. Infant was delivered at 12:06 with Apgar's of 2 and 8 and a  weight of 3 pounds 12 ounces. Placenta was delivered without difficulty. No  lacerations were noted. Both babies were taken to the NICU due to  prematurity. The patient tolerated the procedure well and was taken to  recovery room  in good condition. By postpartum day 1, the patient was doing  well. She was up ad lib. She wanted to use Depo-Provera initially and then  may move to  the patch for contraception. Her hemoglobin was 10.3, down from  11.1. White blood cell count was 9.3. Platelets were 165,000. Fasting blood  sugar was 110 but patient had still had some IV fluid recently, prior to  that time. Social work was consulted due to premature twins. By postpartum  day 2, the patient was doing well. Infants were also doing well in the NICU.  The patient's fasting blood sugar was 75. Her physical examination was  within normal limits. She still wanted Depo-Provera. She was deemed to have  received full benefit of her hospital stay and was discharged home.   DISCHARGE INSTRUCTIONS:  Per [facility] Gynecologic  Services handout.   DISCHARGE MEDICATIONS:  1. Motrin 600 mg p.o. q. 6 hours p.r.n. pain.  2. Tylox 1 to 2 q. 3 to 4 hours p.r.n. pain.  3. Depo-Provera 150 mg IM was given before discharge.   FOLLOW UP:  Will occur in 6 weeks at the [facility] and  Gynecologic Services.   CONDITION ON DISCHARGE:  Stable.     Renaldo Reel Emilee Hero, C.N.M.                    Crist Fat Rivard, M.D.    Leeanne Mannan  D:  12/08/2003  T:  12/09/2003  Job:  [id number]

## 2010-12-17 ENCOUNTER — Other Ambulatory Visit (HOSPITAL_COMMUNITY): Payer: Self-pay | Admitting: Obstetrics and Gynecology

## 2010-12-17 DIAGNOSIS — N971 Female infertility of tubal origin: Secondary | ICD-10-CM

## 2011-02-10 ENCOUNTER — Ambulatory Visit (HOSPITAL_COMMUNITY)
Admission: RE | Admit: 2011-02-10 | Discharge: 2011-02-10 | Disposition: A | Discharge status: Home / Self Care | Payer: BC Managed Care – PPO | Source: Ambulatory Visit | Attending: Obstetrics and Gynecology | Admitting: Obstetrics and Gynecology

## 2011-02-10 DIAGNOSIS — Z3049 Encounter for surveillance of other contraceptives: Secondary | ICD-10-CM | POA: Insufficient documentation

## 2011-02-10 DIAGNOSIS — N971 Female infertility of tubal origin: Secondary | ICD-10-CM

## 2011-02-10 MED ORDER — IOHEXOL 300 MG/ML  SOLN
2.0000 mL | Freq: Once | INTRAMUSCULAR | Status: AC | PRN
  Administered 2011-02-10: 2 mL

## 2011-03-28 LAB — URINALYSIS, ROUTINE W REFLEX MICROSCOPIC
Bilirubin Urine: NEGATIVE
Ketones, ur: NEGATIVE
Nitrite: NEGATIVE
Protein, ur: NEGATIVE
Urobilinogen, UA: 0.2

## 2011-03-28 LAB — PREGNANCY, URINE: Preg Test, Ur: NEGATIVE

## 2011-04-04 LAB — POCT URINALYSIS DIP (DEVICE)
Glucose, UA: 100 — AB
Hgb urine dipstick: NEGATIVE
Protein, ur: NEGATIVE
Specific Gravity, Urine: 1.02
Urobilinogen, UA: 1
pH: 6.5

## 2011-04-11 LAB — URINALYSIS, ROUTINE W REFLEX MICROSCOPIC
Glucose, UA: NEGATIVE mg/dL
Hgb urine dipstick: NEGATIVE
Ketones, ur: 15 mg/dL — AB
Leukocytes, UA: NEGATIVE
pH: 7 (ref 5.0–8.0)

## 2011-04-11 LAB — URINE MICROSCOPIC-ADD ON: RBC / HPF: NONE SEEN RBC/hpf (ref <3)

## 2011-04-11 LAB — WET PREP, GENITAL

## 2011-04-11 LAB — GC/CHLAMYDIA PROBE AMP, GENITAL: Chlamydia, DNA Probe: NEGATIVE

## 2011-04-23 LAB — POCT RAPID STREP A: Streptococcus, Group A Screen (Direct): NEGATIVE

## 2011-06-05 ENCOUNTER — Other Ambulatory Visit: Payer: Self-pay | Admitting: Family Medicine

## 2011-06-05 ENCOUNTER — Other Ambulatory Visit: Payer: Self-pay | Admitting: Otolaryngology

## 2011-06-05 DIAGNOSIS — Z803 Family history of malignant neoplasm of breast: Secondary | ICD-10-CM

## 2011-06-05 DIAGNOSIS — R922 Inconclusive mammogram: Secondary | ICD-10-CM

## 2011-06-05 DIAGNOSIS — Z1231 Encounter for screening mammogram for malignant neoplasm of breast: Secondary | ICD-10-CM

## 2011-07-07 ENCOUNTER — Other Ambulatory Visit: Payer: BC Managed Care – PPO

## 2011-07-07 ENCOUNTER — Ambulatory Visit
Admission: RE | Admit: 2011-07-07 | Discharge: 2011-07-07 | Disposition: A | Discharge status: Home / Self Care | Payer: No Typology Code available for payment source | Source: Ambulatory Visit | Attending: Family Medicine | Admitting: Family Medicine

## 2011-07-07 ENCOUNTER — Other Ambulatory Visit: Payer: Self-pay | Admitting: Family Medicine

## 2011-07-07 ENCOUNTER — Ambulatory Visit
Admission: RE | Admit: 2011-07-07 | Discharge: 2011-07-07 | Disposition: A | Discharge status: Home / Self Care | Payer: BC Managed Care – PPO | Source: Ambulatory Visit | Attending: Family Medicine | Admitting: Family Medicine

## 2011-07-07 DIAGNOSIS — Z803 Family history of malignant neoplasm of breast: Secondary | ICD-10-CM

## 2011-07-07 DIAGNOSIS — N63 Unspecified lump in unspecified breast: Secondary | ICD-10-CM

## 2011-07-07 DIAGNOSIS — Z1231 Encounter for screening mammogram for malignant neoplasm of breast: Secondary | ICD-10-CM

## 2011-07-07 DIAGNOSIS — R9230 Dense breasts, unspecified: Secondary | ICD-10-CM

## 2011-07-07 DIAGNOSIS — R922 Inconclusive mammogram: Secondary | ICD-10-CM

## 2011-07-14 ENCOUNTER — Other Ambulatory Visit (HOSPITAL_COMMUNITY): Payer: Self-pay | Admitting: *Deleted

## 2011-07-18 ENCOUNTER — Ambulatory Visit (HOSPITAL_COMMUNITY)
Admission: RE | Admit: 2011-07-18 | Discharge: 2011-07-18 | Disposition: A | Discharge status: Home / Self Care | Payer: BC Managed Care – PPO | Source: Ambulatory Visit | Attending: Family Medicine | Admitting: Family Medicine

## 2011-07-18 ENCOUNTER — Ambulatory Visit
Admission: RE | Admit: 2011-07-18 | Discharge: 2011-07-18 | Disposition: A | Discharge status: Home / Self Care | Payer: No Typology Code available for payment source | Source: Ambulatory Visit | Attending: Family Medicine | Admitting: Family Medicine

## 2011-07-18 DIAGNOSIS — R928 Other abnormal and inconclusive findings on diagnostic imaging of breast: Secondary | ICD-10-CM | POA: Insufficient documentation

## 2011-07-18 DIAGNOSIS — N63 Unspecified lump in unspecified breast: Secondary | ICD-10-CM

## 2011-07-18 MED ORDER — GADOBENATE DIMEGLUMINE 529 MG/ML IV SOLN
13.0000 mL | Freq: Once | INTRAVENOUS | Status: AC | PRN
  Administered 2011-07-18: 13 mL via INTRAVENOUS

## 2011-08-05 ENCOUNTER — Ambulatory Visit (INDEPENDENT_AMBULATORY_CARE_PROVIDER_SITE_OTHER): Payer: BC Managed Care – PPO | Admitting: Internal Medicine

## 2011-08-05 VITALS — BP 118/74 | HR 68 | Temp 98.5°F | Resp 18 | Ht 62.25 in | Wt 149.0 lb

## 2011-08-05 DIAGNOSIS — J069 Acute upper respiratory infection, unspecified: Secondary | ICD-10-CM

## 2011-08-05 DIAGNOSIS — J45909 Unspecified asthma, uncomplicated: Secondary | ICD-10-CM

## 2011-08-05 DIAGNOSIS — J45901 Unspecified asthma with (acute) exacerbation: Secondary | ICD-10-CM

## 2011-08-05 MED ORDER — AZITHROMYCIN 250 MG PO TABS: ORAL_TABLET | ORAL | Status: AC

## 2011-08-05 MED ORDER — HYDROCODONE-HOMATROPINE 5-1.5 MG/5ML PO SYRP: 5.0000 mL | ORAL_SOLUTION | Freq: Three times a day (TID) | ORAL | Status: AC | PRN

## 2011-08-05 MED ORDER — ALBUTEROL SULFATE HFA 108 (90 BASE) MCG/ACT IN AERS: 2.0000 | INHALATION_SPRAY | Freq: Four times a day (QID) | RESPIRATORY_TRACT | Status: DC | PRN

## 2011-08-05 NOTE — Progress Notes (Signed)
Subjective:     [name] [name] is a 31 y.o. female who presents for evaluation of chills, fever, nasal congestion, nonproductive cough and wheezing. Symptoms began 1 day ago. Symptoms have been unchanged since that time. Past history is significant for asthma.  The following portions of the patient's history were reviewed and updated as appropriate: allergies, current medications, past family history, past medical history, past social history, past surgical history and problem list.  Review of Systems Constitutional: positive for chills, fatigue, fevers and night sweats Ears, nose, mouth, throat, and face: positive for hoarseness and nasal congestion Respiratory: positive for asthma, cough and pleurisy/chest pain    Objective:    General appearance: no distress Eyes: conjunctivae/corneas clear. PERRL, EOM's intact. Fundi benign. Ears: normal TM's and external ear canals both ears Nose: Nares normal. Septum midline. Mucosa normal. No drainage or sinus tenderness. Throat: lips, mucosa, and tongue normal; teeth and gums normal Lungs: wheezes bilaterally Lymph nodes: Cervical, supraclavicular, and axillary nodes normal.    Assessment:    Acute Bronchitis, Asthma and Reactive Airway Disease    Plan:    Worsening signs and symptoms discussed. Rest, fluids, acetaminophen, and humidification. Follow up as needed for persistent, worsening cough, or appearance of new symptoms.

## 2011-08-22 ENCOUNTER — Ambulatory Visit (INDEPENDENT_AMBULATORY_CARE_PROVIDER_SITE_OTHER): Payer: BC Managed Care – PPO | Admitting: Internal Medicine

## 2011-08-22 ENCOUNTER — Telehealth: Payer: Self-pay | Admitting: Family Medicine

## 2011-08-22 DIAGNOSIS — Z209 Contact with and (suspected) exposure to unspecified communicable disease: Secondary | ICD-10-CM

## 2011-08-22 DIAGNOSIS — J45909 Unspecified asthma, uncomplicated: Secondary | ICD-10-CM

## 2011-08-22 DIAGNOSIS — R05 Cough: Secondary | ICD-10-CM

## 2011-08-22 DIAGNOSIS — N760 Acute vaginitis: Secondary | ICD-10-CM

## 2011-08-22 DIAGNOSIS — Z202 Contact with and (suspected) exposure to infections with a predominantly sexual mode of transmission: Secondary | ICD-10-CM

## 2011-08-22 DIAGNOSIS — J40 Bronchitis, not specified as acute or chronic: Secondary | ICD-10-CM

## 2011-08-22 LAB — POCT CBC
HCT, POC: 41.7 % (ref 37.7–47.9)
Lymph, poc: 2 (ref 0.6–3.4)
MCHC: 32.1 g/dL (ref 31.8–35.4)
MCV: 94.3 fL (ref 80–97)
MID (cbc): 0.5 (ref 0–0.9)
POC LYMPH PERCENT: 31.6 %L (ref 10–50)
RDW, POC: 12.5 %

## 2011-08-22 LAB — HIV ANTIBODY (ROUTINE TESTING W REFLEX): HIV: NONREACTIVE

## 2011-08-22 LAB — POCT UA - MICROSCOPIC ONLY
Casts, Ur, LPF, POC: NEGATIVE
Mucus, UA: NEGATIVE
Yeast, UA: NEGATIVE

## 2011-08-22 LAB — POCT URINALYSIS DIPSTICK
Leukocytes, UA: NEGATIVE
Nitrite, UA: NEGATIVE
Protein, UA: NEGATIVE
Urobilinogen, UA: 0.2
pH, UA: 7.5

## 2011-08-22 LAB — POCT WET PREP WITH KOH: KOH Prep POC: POSITIVE

## 2011-08-22 MED ORDER — DOXYCYCLINE HYCLATE 100 MG PO TABS: 100.0000 mg | ORAL_TABLET | Freq: Two times a day (BID) | ORAL | Status: AC

## 2011-08-22 MED ORDER — METRONIDAZOLE 500 MG PO TABS: 500.0000 mg | ORAL_TABLET | Freq: Two times a day (BID) | ORAL | Status: AC

## 2011-08-22 MED ORDER — FLUCONAZOLE 150 MG PO TABS: 150.0000 mg | ORAL_TABLET | Freq: Once | ORAL | Status: AC

## 2011-08-22 NOTE — Progress Notes (Signed)
Subjective:    Patient ID: [name] [name], female    DOB: 07-16-1980, 31 y.o.   MRN: [id number]  HPI Cough Asthma Vaginal discharge STD exposure    Review of Systems CPE 10/12    Objective:   Physical Exam  Constitutional: She appears well-developed and well-nourished. No distress.  HENT:  Nose: Nose normal.  Mouth/Throat: Oropharynx is clear and moist. No oropharyngeal exudate.  Cardiovascular: Normal heart sounds.   Pulmonary/Chest: Effort normal. No respiratory distress. She has wheezes. She exhibits tenderness.  Genitourinary: Pelvic exam was performed with patient in the knee-chest position. There is no tenderness on the right labia. There is no tenderness on the left labia. There is erythema around the vagina. Vaginal discharge found.  Lymphadenopathy:       Right: No inguinal adenopathy present.       Left: No inguinal adenopathy present.      Results for orders placed in visit on 08/22/11  POCT CBC      Component Value Range   WBC 6.4  4.6 - 10.2 (K/uL)   Lymph, poc 2.0  0.6 - 3.4    POC LYMPH PERCENT 31.6  10 - 50 (%L)   MID (cbc) 0.5  0 - 0.9    POC MID % 7.1  0 - 12 (%M)   POC Granulocyte 3.9  2 - 6.9    Granulocyte percent 61.3  37 - 80 (%G)   RBC 4.42  4.04 - 5.48 (M/uL)   Hemoglobin 13.4  12.2 - 16.2 (g/dL)   HCT, POC 40.9  81.1 - 47.9 (%)   MCV 94.3  80 - 97 (fL)   MCH, POC 30.3  27 - 31.2 (pg)   MCHC 32.1  31.8 - 35.4 (g/dL)   RDW, POC 91.4     Platelet Count, POC 246  142 - 424 (K/uL)   MPV 9.6  0 - 99.8 (fL)  POCT WET PREP WITH KOH      Component Value Range   Trichomonas, UA Negative     Clue Cells Wet Prep HPF POC 100%     Epithelial Wet Prep HPF POC 8-10     Yeast Wet Prep HPF POC negative     Bacteria Wet Prep HPF POC 1+     RBC Wet Prep HPF POC 0-1     WBC Wet Prep HPF POC 2-6     KOH Prep POC Positive    POCT URINALYSIS DIPSTICK      Component Value Range   Color, UA yellow     Clarity, UA cloudy     Glucose, UA neg     Bilirubin, UA neg     Ketones, UA ne     Spec Grav, UA 1.020     Blood, UA neg     pH, UA 7.5     Protein, UA neg     Urobilinogen, UA 0.2     Nitrite, UA neg     Leukocytes, UA Negative    POCT UA - MICROSCOPIC ONLY      Component Value Range   WBC, Ur, HPF, POC 0-1     RBC, urine, microscopic 0-1     Bacteria, U Microscopic 1+     Mucus, UA neg     Epithelial cells, urine per micros 3-6     Crystals, Ur, HPF, POC neg     Casts, Ur, LPF, POC neg     Yeast, UA neg  Assessment & Plan:   Vaginitis BV Bronchitis STD exposure   Flagyl 500 BID Doxycycline 100mg  BID Diflucan 150mg

## 2011-08-22 NOTE — Telephone Encounter (Signed)
Rxs printed instead of going to pharmacy.  Meds were called in.

## 2011-08-23 LAB — GC/CHLAMYDIA PROBE AMP, URINE: Chlamydia, Swab/Urine, PCR: NEGATIVE

## 2011-10-06 ENCOUNTER — Other Ambulatory Visit: Payer: Self-pay | Admitting: Internal Medicine

## 2011-11-25 ENCOUNTER — Other Ambulatory Visit: Payer: Self-pay | Admitting: Physician Assistant

## 2011-12-25 ENCOUNTER — Other Ambulatory Visit: Payer: Self-pay | Admitting: Physician Assistant

## 2012-01-30 ENCOUNTER — Ambulatory Visit (INDEPENDENT_AMBULATORY_CARE_PROVIDER_SITE_OTHER): Payer: BC Managed Care – PPO | Admitting: Internal Medicine

## 2012-01-30 VITALS — BP 124/72 | HR 68 | Temp 98.0°F | Resp 17 | Ht 62.5 in | Wt 124.0 lb

## 2012-01-30 DIAGNOSIS — N760 Acute vaginitis: Secondary | ICD-10-CM

## 2012-01-30 DIAGNOSIS — R300 Dysuria: Secondary | ICD-10-CM

## 2012-01-30 DIAGNOSIS — B9689 Other specified bacterial agents as the cause of diseases classified elsewhere: Secondary | ICD-10-CM

## 2012-01-30 LAB — POCT UA - MICROSCOPIC ONLY
Casts, Ur, LPF, POC: NEGATIVE
Crystals, Ur, HPF, POC: NEGATIVE
Mucus, UA: POSITIVE
Yeast, UA: NEGATIVE

## 2012-01-30 LAB — POCT URINALYSIS DIPSTICK
Blood, UA: NEGATIVE
Glucose, UA: NEGATIVE
Nitrite, UA: NEGATIVE
Protein, UA: NEGATIVE
Urobilinogen, UA: 0.2
pH, UA: 7

## 2012-01-30 LAB — POCT WET PREP WITH KOH: KOH Prep POC: NEGATIVE

## 2012-01-30 MED ORDER — METRONIDAZOLE 500 MG PO TABS: 500.0000 mg | ORAL_TABLET | Freq: Two times a day (BID) | ORAL | Status: AC

## 2012-01-30 NOTE — Patient Instructions (Addendum)
Bacterial Vaginosis Bacterial vaginosis (BV) is a vaginal infection where the normal balance of bacteria in the vagina is disrupted. The normal balance is then replaced by an overgrowth of certain bacteria. There are several different kinds of bacteria that can cause BV. BV is the most common vaginal infection in women of childbearing age. CAUSES   The cause of BV is not fully understood. BV develops when there is an increase or imbalance of harmful bacteria.   Some activities or behaviors can upset the normal balance of bacteria in the vagina and put women at increased risk including:   Having a new sex partner or multiple sex partners.   Douching.   Using an intrauterine device (IUD) for contraception.   It is not clear what role sexual activity plays in the development of BV. However, women that have never had sexual intercourse are rarely infected with BV.  Women do not get BV from toilet seats, bedding, swimming pools or from touching objects around them.  SYMPTOMS   Grey vaginal discharge.   A fish-like odor with discharge, especially after sexual intercourse.   Itching or burning of the vagina and vulva.   Burning or pain with urination.   Some women have no signs or symptoms at all.  DIAGNOSIS  Your caregiver must examine the vagina for signs of BV. Your caregiver will perform lab tests and look at the sample of vaginal fluid through a microscope. They will look for bacteria and abnormal cells (clue cells), a pH test higher than 4.5, and a positive amine test all associated with BV.  RISKS AND COMPLICATIONS   Pelvic inflammatory disease (PID).   Infections following gynecology surgery.   Developing HIV.   Developing herpes virus.  TREATMENT  Sometimes BV will clear up without treatment. However, all women with symptoms of BV should be treated to avoid complications, especially if gynecology surgery is planned. Female partners generally do not need to be treated. However,  BV may spread between female sex partners so treatment is helpful in preventing a recurrence of BV.   BV may be treated with antibiotics. The antibiotics come in either pill or vaginal cream forms. Either can be used with nonpregnant or pregnant women, but the recommended dosages differ. These antibiotics are not harmful to the baby.   BV can recur after treatment. If this happens, a second round of antibiotics will often be prescribed.   Treatment is important for pregnant women. If not treated, BV can cause a premature delivery, especially for a pregnant woman who had a premature birth in the past. All pregnant women who have symptoms of BV should be checked and treated.   For chronic reoccurrence of BV, treatment with a type of prescribed gel vaginally twice a week is helpful.  HOME CARE INSTRUCTIONS   Finish all medication as directed by your caregiver.   Do not have sex until treatment is completed.   Tell your sexual partner that you have a vaginal infection. They should see their caregiver and be treated if they have problems, such as a mild rash or itching.   Practice safe sex. Use condoms. Only have 1 sex partner.  PREVENTION  Basic prevention steps can help reduce the risk of upsetting the natural balance of bacteria in the vagina and developing BV:  Do not have sexual intercourse (be abstinent).   Do not douche.   Use all of the medicine prescribed for treatment of BV, even if the signs and symptoms go away.     Tell your sex partner if you have BV. That way, they can be treated, if needed, to prevent reoccurrence.  SEEK MEDICAL CARE IF:   Your symptoms are not improving after 3 days of treatment.   You have increased discharge, pain, or fever.  MAKE SURE YOU:   Understand these instructions.   Will watch your condition.   Will get help right away if you are not doing well or get worse.  FOR MORE INFORMATION  Division of STD Prevention (DSTDP), Centers for Disease  Control and Prevention: www.cdc.gov/std American Social Health Association (ASHA): www.ashastd.org  Document Released: 06/23/2005 Document Revised: 06/12/2011 Document Reviewed: 12/14/2008 Exit[employer] Patient Information 2012 [employer]. 

## 2012-01-30 NOTE — Progress Notes (Signed)
  Subjective:    Patient ID: [name] [name], female    DOB: 17-Oct-1980, 31 y.o.   MRN: [id number]  HPI Has itching, burning in genital area. No fever, no rash seen. May have had an exposure. Discomfort is intra and extra vaginal.   Review of Systems     Objective:   Physical Exam No rash or erythema seen Discharge is yellow with odor Mucosa is tender but normal appearing  Results for orders placed in visit on 01/30/12  POCT UA - MICROSCOPIC ONLY      Component Value Range   WBC, Ur, HPF, POC 0-3     RBC, urine, microscopic 0-1     Bacteria, U Microscopic trace     Mucus, UA positive     Epithelial cells, urine per micros 1-4     Crystals, Ur, HPF, POC neg     Casts, Ur, LPF, POC neg     Yeast, UA neg    POCT URINALYSIS DIPSTICK      Component Value Range   Color, UA yellow     Clarity, UA clear     Glucose, UA neg     Bilirubin, UA neg     Ketones, UA neg     Spec Grav, UA 1.020     Blood, UA neg     pH, UA 7.0     Protein, UA neg     Urobilinogen, UA 0.2     Nitrite, UA neg     Leukocytes, UA Negative    POCT WET PREP WITH KOH      Component Value Range   Trichomonas, UA Negative     Clue Cells Wet Prep HPF POC 100%     Epithelial Wet Prep HPF POC 10-12     Yeast Wet Prep HPF POC neg     Bacteria Wet Prep HPF POC 1+     RBC Wet Prep HPF POC 0-1     WBC Wet Prep HPF POC 0-1     KOH Prep POC Negative          Assessment & Plan:  BV Flagyl

## 2012-05-19 ENCOUNTER — Ambulatory Visit (INDEPENDENT_AMBULATORY_CARE_PROVIDER_SITE_OTHER): Payer: BC Managed Care – PPO | Admitting: Family Medicine

## 2012-05-19 VITALS — BP 105/60 | HR 77 | Temp 98.5°F | Resp 18 | Wt 130.0 lb

## 2012-05-19 DIAGNOSIS — J45909 Unspecified asthma, uncomplicated: Secondary | ICD-10-CM

## 2012-05-19 DIAGNOSIS — Z2089 Contact with and (suspected) exposure to other communicable diseases: Secondary | ICD-10-CM

## 2012-05-19 DIAGNOSIS — J069 Acute upper respiratory infection, unspecified: Secondary | ICD-10-CM

## 2012-05-19 DIAGNOSIS — Z20818 Contact with and (suspected) exposure to other bacterial communicable diseases: Secondary | ICD-10-CM

## 2012-05-19 LAB — POCT RAPID STREP A (OFFICE): Rapid Strep A Screen: NEGATIVE

## 2012-05-19 MED ORDER — ALBUTEROL SULFATE HFA 108 (90 BASE) MCG/ACT IN AERS: 2.0000 | INHALATION_SPRAY | RESPIRATORY_TRACT | Status: DC | PRN

## 2012-05-19 MED ORDER — PREDNISONE 20 MG PO TABS: ORAL_TABLET | ORAL | Status: DC

## 2012-05-19 NOTE — Patient Instructions (Addendum)
Let me know if you are not better in the next couple of days- Sooner if worse.

## 2012-05-19 NOTE — Progress Notes (Signed)
[facility] [address] [place] [address] [phone]  Date:  05/19/2012   Name:  [name] [name]   DOB:  12-16-1980   MRN:  [id number]  PCP:  No primary provider on file.    Chief Complaint: URI   History of Present Illness:  [name] [name] is a 31 y.o. very pleasant female patient who presents with the following:  She is here with "a real bad cold."  She notes chest congestion and a cough.  She has been ill for 2 days.   Her cough is non- productive, but she has a runny/ stuffy nose and PND.  She has noted a slight wheeze.  Her right ear is a little bit sore- this just started today. Her throat is slightly sore.  She has had a T and A.   Her daughter recently had strep throat as well.   No GI symptoms.  She has not noted a fever but has had some chills.  She has pressure in her face, but no other body aches.   She has been using her ventolin more than usual.   She has not taken any antipyretics today, last used her albuterol inhaler about 2 hours ago.  She also uses flovent   Patient Active Problem List  Diagnosis  . Asthma    Past Medical History  Diagnosis Date  . Asthma   . Allergy     Past Surgical History  Procedure Date  . Tonsillectomy and adenoidectomy   . Adiana   . Dnc     History  Substance Use Topics  . Smoking status: Never Smoker   . Smokeless tobacco: Not on file  . Alcohol Use: Yes     Comment: couple times per month    Family History  Problem Relation Age of Onset  . Hypertension Mother   . Diabetes Mother   . Cancer Mother     Allergies  Allergen Reactions  . Nitrofurantoin Monohyd Macro Hives    Medication list has been reviewed and updated.  Current Outpatient Prescriptions on File Prior to Visit  Medication Sig Dispense Refill  . fluticasone (FLOVENT HFA) 110 MCG/ACT inhaler Inhale 1 puff into the lungs 2 (two) times daily.      . VENTOLIN HFA 108 (90 BASE) MCG/ACT inhaler INHALE  TWO PUFFS EVERY 6 HOURS AS NEEDED FOR WHEEZING  18 g  1  . Pseudoeph-Doxylamine-DM-APAP (NYQUIL MULTI-SYMPTOM PO) Take by mouth as needed.        Review of Systems:  As per HPI- otherwise negative.   Physical Examination: Filed Vitals:   05/19/12 0831  BP: 105/60  Pulse: 77  Temp: 98.5 F (36.9 C)  Resp: 18   Filed Vitals:   05/19/12 0831  Weight: 130 lb (58.968 kg)   There is no height on file to calculate BMI. Ideal Body Weight:    GEN: WDWN, NAD, Non-toxic, A & O x 3 HEENT: Atraumatic, Normocephalic. Neck supple. No masses, No LAD.  Bilateral TM wnl, oropharynx normal.  PEERL,EOMI.   Nasal cavity is normal- no congestion Ears and Nose: No external deformity. CV: RRR, No M/G/R. No JVD. No thrill. No extra heart sounds. PULM: CTA B, no wheezes, crackles, rhonchi. No retractions. No resp. distress. No accessory muscle use. EXTR: No c/c/e NEURO Normal gait.  PSYCH: Normally interactive. Conversant. Not depressed or anxious appearing.  Calm demeanor.   Results for orders placed in visit on 05/19/12  POCT RAPID STREP A (OFFICE)  Component Value Range   Rapid Strep A Screen Negative  Negative    Assessment and Plan: 1. URI (upper respiratory infection)    2. Asthma  predniSONE (DELTASONE) 20 MG tablet, albuterol (VENTOLIN HFA) 108 (90 BASE) MCG/ACT inhaler  3. Exposure to strep throat  POCT rapid strep A   Prednisone for exacerbation of asthma symptoms due to illness   Najir Roop, MD

## 2012-05-29 ENCOUNTER — Other Ambulatory Visit: Payer: Self-pay | Admitting: Family Medicine

## 2012-06-11 ENCOUNTER — Other Ambulatory Visit: Payer: Self-pay | Admitting: *Deleted

## 2012-06-11 DIAGNOSIS — Z1231 Encounter for screening mammogram for malignant neoplasm of breast: Secondary | ICD-10-CM

## 2012-06-26 ENCOUNTER — Ambulatory Visit (INDEPENDENT_AMBULATORY_CARE_PROVIDER_SITE_OTHER): Payer: BC Managed Care – PPO | Admitting: Family Medicine

## 2012-06-26 VITALS — BP 103/71 | HR 68 | Temp 98.4°F | Resp 16 | Ht 62.75 in | Wt 129.0 lb

## 2012-06-26 DIAGNOSIS — F329 Major depressive disorder, single episode, unspecified: Secondary | ICD-10-CM

## 2012-06-26 DIAGNOSIS — J45909 Unspecified asthma, uncomplicated: Secondary | ICD-10-CM

## 2012-06-26 DIAGNOSIS — Z0000 Encounter for general adult medical examination without abnormal findings: Secondary | ICD-10-CM

## 2012-06-26 DIAGNOSIS — N898 Other specified noninflammatory disorders of vagina: Secondary | ICD-10-CM

## 2012-06-26 LAB — COMPREHENSIVE METABOLIC PANEL
ALT: 24 U/L (ref 0–35)
AST: 18 U/L (ref 0–37)
Albumin: 4.2 g/dL (ref 3.5–5.2)
Calcium: 9.1 mg/dL (ref 8.4–10.5)
Chloride: 105 mEq/L (ref 96–112)
Creat: 0.77 mg/dL (ref 0.50–1.10)
Potassium: 4.4 mEq/L (ref 3.5–5.3)
Sodium: 138 mEq/L (ref 135–145)

## 2012-06-26 LAB — POCT CBC
Lymph, poc: 2 (ref 0.6–3.4)
MCH, POC: 30.5 pg (ref 27–31.2)
MCHC: 31.5 g/dL — AB (ref 31.8–35.4)
MID (cbc): 0.4 (ref 0–0.9)
MPV: 9.6 fL (ref 0–99.8)
POC MID %: 5.7 %M (ref 0–12)
Platelet Count, POC: 226 10*3/uL (ref 142–424)
RDW, POC: 13 %
WBC: 6.5 10*3/uL (ref 4.6–10.2)

## 2012-06-26 LAB — LIPID PANEL
HDL: 42 mg/dL (ref >39)
LDL Cholesterol: 73 mg/dL (ref 0–99)

## 2012-06-26 LAB — POCT WET PREP WITH KOH
KOH Prep POC: NEGATIVE
WBC Wet Prep HPF POC: 2.4

## 2012-06-26 MED ORDER — ALBUTEROL SULFATE HFA 108 (90 BASE) MCG/ACT IN AERS: 2.0000 | INHALATION_SPRAY | RESPIRATORY_TRACT | Status: DC | PRN

## 2012-06-26 MED ORDER — METRONIDAZOLE 500 MG PO TABS: 500.0000 mg | ORAL_TABLET | Freq: Two times a day (BID) | ORAL | Status: DC

## 2012-06-26 MED ORDER — FLUTICASONE PROPIONATE HFA 110 MCG/ACT IN AERO: 2.0000 | INHALATION_SPRAY | Freq: Two times a day (BID) | RESPIRATORY_TRACT | Status: DC

## 2012-06-26 MED ORDER — DESVENLAFAXINE SUCCINATE ER 50 MG PO TB24: 50.0000 mg | ORAL_TABLET | Freq: Every day | ORAL | Status: DC

## 2012-06-26 NOTE — Progress Notes (Signed)
[facility] [address], [place] [address] [phone]  Date:  06/26/2012   Name:  [name] [name]   DOB:  [birth year]   MRN:  [id number]  PCP:  No primary provider on file.    Chief Complaint: Annual Exam   History of Present Illness:  [name] [name] is a 31 y.o. very pleasant female patient who presents with the following:  Here today for a CPE and pap Due to family history she has started having mammograms already, she is followed by the breast center.  Her most recent mammogram was normal.    She has had a procedure to block her tubes for contraception.  She has 4 children ages 77, 71, and 68 year old twins.   She also has noted some vaginal odor- she has history of frequent BV and yeast infections.    Here last pap was 2 years ago- no history of abnormal pap Flu shot done, tetanus shot UTD  Her asthma is controlled, but she needs her albuterol refilled.  She uses Flovent more when the weather changes and is using it BID right now.  She is under some stress as she is getting divorced and working on finding a new home. She has some anxiety and also feels that she is depressed. She has some sadness, but does not have any SI.  She took effexor in the past- used about 2 years ago after a miscarriage.  She also tried paxil and zoloft but these did not seem to help her much.  She would be interested in trying another medication to help her feel less sad and to improve her energy level.    Patient Active Problem List  Diagnosis  . Asthma    Past Medical History  Diagnosis Date  . Asthma   . Allergy   . Depression     Past Surgical History  Procedure Date  . Tonsillectomy and adenoidectomy   . Adiana   . Dnc   . Tubal ligation     History  Substance Use Topics  . Smoking status: Never Smoker   . Smokeless tobacco: Not on file  . Alcohol Use: Yes     Comment: couple times per month    Family History  Problem Relation  Age of Onset  . Hypertension Mother   . Diabetes Mother   . Cancer Mother     Allergies  Allergen Reactions  . Nitrofurantoin Monohyd Macro Hives    Medication list has been reviewed and updated.  Current Outpatient Prescriptions on File Prior to Visit  Medication Sig Dispense Refill  . albuterol (VENTOLIN HFA) 108 (90 BASE) MCG/ACT inhaler Inhale 2 puffs into the lungs every 4 (four) hours as needed for wheezing.  18 g  3  . FLOVENT HFA 110 MCG/ACT inhaler INHALE ONE PUFF BY MOUTH TWICE DAILY  12 g  0  . predniSONE (DELTASONE) 20 MG tablet Take 2 pills a day for 3 days  6 tablet  0  . Pseudoeph-Doxylamine-DM-APAP (NYQUIL MULTI-SYMPTOM PO) Take by mouth as needed.        Review of Systems:  As per HPI- otherwise negative.   Physical Examination: Filed Vitals:   06/26/12 0807  BP: 103/71  Pulse: 68  Temp: 98.4 F (36.9 C)  Resp: 16   Filed Vitals:   06/26/12 0807  Height: 5' 2.75" (1.594 m)  Weight: 129 lb (58.514 kg)   Body mass index is 23.03 kg/(m^2). Ideal Body Weight: Weight  in (lb) to have BMI = 25: 139.7   GEN: WDWN, NAD, Non-toxic, A & O x 3 HEENT: Atraumatic, Normocephalic. Neck supple. No masses, No LAD. Bilateral TM wnl, oropharynx normal.  PEERL,EOMI.   Ears and Nose: No external deformity. CV: RRR, No M/G/R. No JVD. No thrill. No extra heart sounds. PULM: CTA B, no wheezes, crackles, rhonchi. No retractions. No resp. distress. No accessory muscle use. ABD: S, NT, ND, +BS. No rebound. No HSM. EXTR: No c/c/e NEURO Normal gait.  PSYCH: Normally interactive. Conversant. Not depressed or anxious appearing.  Calm demeanor.  Breasts: normal, no masses GU: normal, no lesions, no CMT, no adnexal tenderness or masses  Results for orders placed in visit on 06/26/12  POCT CBC      Component Value Range   WBC 6.5  4.6 - 10.2 K/uL   Lymph, poc 2.0  0.6 - 3.4   POC LYMPH PERCENT 30.4  10 - 50 %L   MID (cbc) 0.4  0 - 0.9   POC MID % 5.7  0 - 12 %M   POC  Granulocyte 4.2  2 - 6.9   Granulocyte percent 63.9  37 - 80 %G   RBC 4.62  4.04 - 5.48 M/uL   Hemoglobin 14.1  12.2 - 16.2 g/dL   HCT, POC 16.1  09.6 - 47.9 %   MCV 96.9  80 - 97 fL   MCH, POC 30.5  27 - 31.2 pg   MCHC 31.5 (*) 31.8 - 35.4 g/dL   RDW, POC 04.5     Platelet Count, POC 226  142 - 424 K/uL   MPV 9.6  0 - 99.8 fL  POCT WET PREP WITH KOH      Component Value Range   Trichomonas, UA Negative     Clue Cells Wet Prep HPF POC neg     Epithelial Wet Prep HPF POC 0-3     Yeast Wet Prep HPF POC neg     Bacteria Wet Prep HPF POC trace     RBC Wet Prep HPF POC neg     WBC Wet Prep HPF POC 2.4     KOH Prep POC Negative       Assessment and Plan: 1. Physical exam, annual  POCT CBC, Comprehensive metabolic panel, TSH, Lipid panel, Pap IG, CT/NG w/ reflex HPV when ASC-U  2. Vaginal discharge  POCT Wet Prep with KOH, metroNIDAZOLE (FLAGYL) 500 MG tablet  3. Asthma  albuterol (VENTOLIN HFA) 108 (90 BASE) MCG/ACT inhaler, fluticasone (FLOVENT HFA) 110 MCG/ACT inhaler  4. Depression  desvenlafaxine (PRISTIQ) 50 MG 24 hr tablet   Performed a pap and age- appropriate BW today, immunizations UTD.  Gave an rx for flagyl to use if her vaginal odor does not resolve.    Will try pristiq for her depression symptoms, she will let me know if not helpful  Refilled her medications for asthma, which she is using regularly right now.    Will plan further follow- up pending labs.   Abbe Amsterdam, MD

## 2012-06-26 NOTE — Patient Instructions (Addendum)
Try the pristiq for your mood.  Let me know how you are doing in a month or so. I will be in touch with your labs in the next week.  Take care and happy holidays!

## 2012-07-01 LAB — PAP IG, CT-NG, RFX HPV ASCU: GC Probe Amp: NEGATIVE

## 2012-07-08 ENCOUNTER — Encounter: Payer: Self-pay | Admitting: Family Medicine

## 2012-07-12 ENCOUNTER — Telehealth: Payer: Self-pay

## 2012-07-12 NOTE — Telephone Encounter (Signed)
Pt would like to know what should she do about her abnormal pap smear.  Best# [phone] (work) or [phone]

## 2012-07-13 NOTE — Telephone Encounter (Signed)
Patient advised. She has not gotten the letter yet and will keep an eye out for it. Reassurance provided.

## 2012-07-13 NOTE — Telephone Encounter (Signed)
Your blood labs including CBC, metabolic profile, cholesterol and TSH look fine.  Please see you complete pap report attached.  Your pap is slightly abnormal (called atypical cells of undetermined significance or ASCUS), but your HPV testing is normal which means we can repeat your pap in one year.  Please also see attached a copy of the recommendation for follow- up of "ASCUS" pap from the [facility].  It also appears that you do indeed have BV, so if you have not already you can take the metronidazole for this.    This was sent in the letter. I left message for her to call me back so I can explain.

## 2012-07-23 ENCOUNTER — Ambulatory Visit
Admission: RE | Admit: 2012-07-23 | Discharge: 2012-07-23 | Disposition: A | Discharge status: Home / Self Care | Payer: BC Managed Care – PPO | Source: Ambulatory Visit | Attending: *Deleted | Admitting: *Deleted

## 2012-07-23 DIAGNOSIS — Z1231 Encounter for screening mammogram for malignant neoplasm of breast: Secondary | ICD-10-CM

## 2012-08-02 ENCOUNTER — Telehealth: Payer: Self-pay

## 2012-08-02 NOTE — Telephone Encounter (Signed)
PATIENTS IS CALLING TO ASK ABOUT A PRIOR AUTHORIZATION FOR HER RX FOR ANTI-DEPRESSANTS. SHE SAYS SHE CANT MISS ANYMORE DAYS ON HER MEDICATION AND SHE IS DOWN ONE PILL. CALL BACK FOR ANY QUESTIONS AT [phone]. THANKS

## 2012-08-03 NOTE — Telephone Encounter (Signed)
Called pharmacy and requested that they send information for prior auth and was told they would fax it right away. Notified pt that I have requested this from pharmacy and will work on her PA as soon as I get it. She reported that she took her last tablet last night.

## 2012-08-03 NOTE — Telephone Encounter (Signed)
Completed prior auth form and faxed back to [facility] w/note for urgent review requested.

## 2012-08-04 ENCOUNTER — Telehealth: Payer: Self-pay

## 2012-08-04 NOTE — Telephone Encounter (Signed)
Pt is calling to talk with amy about the pre authorization and the coverage of her insurance for her medication   She states she has talked to her insurance company and her authorization number is [id number]  \ Please call [phone] -she was very upset that she has not gotten a call back today about this

## 2012-08-04 NOTE — Telephone Encounter (Signed)
I called to check on prior authorization, spoke to [place] and she can not locate patient in their computer. She had supervisor check and her [employer] account is not active. I have called patient to advise.

## 2012-08-05 NOTE — Telephone Encounter (Signed)
I received notice of approval for PA this morning and contacted pt to notify her. Also faxed approval notice to pharmacy for dates 08/04/2012 - 04/30/2015.

## 2012-08-05 NOTE — Telephone Encounter (Signed)
See previous phone message. 

## 2012-08-10 ENCOUNTER — Telehealth: Payer: Self-pay

## 2012-08-10 NOTE — Telephone Encounter (Signed)
PT WOULD LIKE TO TALK TO DR COPLAND OR NURSE REGARDING HER PRESTIQUE MEDICATION PLEASE CALL [phone]

## 2012-08-11 ENCOUNTER — Telehealth: Payer: Self-pay

## 2012-08-11 MED ORDER — BUPROPION HCL ER (SR) 150 MG PO TB12: 150.0000 mg | ORAL_TABLET | Freq: Two times a day (BID) | ORAL | Status: DC

## 2012-08-11 NOTE — Telephone Encounter (Signed)
Please see 06/26/12 office visit, she has tried multiple antidepressants without relief, please advise what is next step.

## 2012-08-11 NOTE — Telephone Encounter (Signed)
PATIENT WOULD LIKE FOR DR. Patsy Lager OR HER NURSE TO GIVE HER A CALL BACK.  SHE WAS PRESCRIBED MEDICATION FOR DEPRESSION AND IT IS "NOT WORKING FOR HER".  THE PATIENT STATES IT KEEPS HER FROM EATING, HAS DECREASED HER SEX DRIVE, AND SHE IS STILL HAVING CRYING EPISODES.  PLEASE CALL HER AT [phone].

## 2012-08-11 NOTE — Telephone Encounter (Addendum)
Called her back- the pristiq has not been a good fit, she is still tearful and now has problems with lack of sex drive which is straining her relationship iwht her BP.  No SI or other danger signs.  She would like to try something else and has been off of the pristiq for at least one week.  Will try wellbutrin, and she will let me know how she is doing in the next few weeks. No change of pregnancy- she has a BTL

## 2012-08-15 ENCOUNTER — Ambulatory Visit (INDEPENDENT_AMBULATORY_CARE_PROVIDER_SITE_OTHER): Payer: BC Managed Care – PPO | Admitting: Physician Assistant

## 2012-08-15 VITALS — BP 107/71 | HR 90 | Temp 98.0°F | Resp 16 | Ht 62.5 in | Wt 135.0 lb

## 2012-08-15 DIAGNOSIS — H5711 Ocular pain, right eye: Secondary | ICD-10-CM

## 2012-08-15 DIAGNOSIS — L0291 Cutaneous abscess, unspecified: Secondary | ICD-10-CM

## 2012-08-15 DIAGNOSIS — L0390 Cellulitis, unspecified: Secondary | ICD-10-CM

## 2012-08-15 DIAGNOSIS — H5710 Ocular pain, unspecified eye: Secondary | ICD-10-CM

## 2012-08-15 LAB — POCT CBC
Granulocyte percent: 65.7 %G (ref 37–80)
HCT, POC: 39 % (ref 37.7–47.9)
Hemoglobin: 13 g/dL (ref 12.2–16.2)
Lymph, poc: 1.8 (ref 0.6–3.4)
MCH, POC: 31.6 pg — AB (ref 27–31.2)
MCHC: 33.3 g/dL (ref 31.8–35.4)
MCV: 94.6 fL (ref 80–97)
MID (cbc): 0.4 (ref 0–0.9)
MPV: 9.8 fL (ref 0–99.8)
POC Granulocyte: 4.2 (ref 2–6.9)
POC LYMPH PERCENT: 27.9 %L (ref 10–50)
POC MID %: 6.4 %M (ref 0–12)
Platelet Count, POC: 213 10*3/uL (ref 142–424)
RBC: 4.12 M/uL (ref 4.04–5.48)
RDW, POC: 13.1 %
WBC: 6.4 10*3/uL (ref 4.6–10.2)

## 2012-08-15 MED ORDER — HYDROCODONE-ACETAMINOPHEN 5-325 MG PO TABS: 1.0000 | ORAL_TABLET | Freq: Four times a day (QID) | ORAL | Status: DC | PRN

## 2012-08-15 MED ORDER — DOXYCYCLINE HYCLATE 100 MG PO CAPS: 100.0000 mg | ORAL_CAPSULE | Freq: Two times a day (BID) | ORAL | Status: DC

## 2012-08-15 NOTE — Progress Notes (Signed)
Subjective:    Patient ID: [name] [name], female    DOB: 06-Nov-1980, 32 y.o.   MRN: [id number]  HPI 32 year old female presents with 3 day history of painful bump above right eye.  States symptoms started suddenly with no history of trauma.  She noticed a small bump 3 days ago that has since progressed to a larger, scabbed area with surrounding swelling and tenderness to touch.  Denies any vision changes, pain with ROM of eye, fever, chills, nausea, vomiting, URI sx's, or otalgia.  She does have pain around her eye associated with this, as well as pain to the touch.  She feels well with only known medical problem asthma. States it is fairly well controlled with her albuterol inhaler but she has a rx for flovent that she was unable to get filled due to cost. Is interested in another rx if possible that is cheaper since her asthma does seem to be better controlled with it.     Review of Systems  Constitutional: Negative for fever and chills.  HENT: Negative for ear pain, congestion, sore throat and rhinorrhea.   Eyes: Positive for pain (surrounding eye). Negative for photophobia, discharge, redness, itching and visual disturbance.  Respiratory: Negative for cough.   Gastrointestinal: Negative for nausea and vomiting.  Neurological: Positive for headaches (on right side ).  All other systems reviewed and are negative.       Objective:   Physical Exam  Constitutional: She is oriented to person, place, and time. She appears well-developed and well-nourished.  HENT:  Head: Normocephalic and atraumatic.  Right Ear: Hearing, tympanic membrane, external ear and ear canal normal.  Left Ear: Hearing, tympanic membrane, external ear and ear canal normal.  Mouth/Throat: Uvula is midline, oropharynx is clear and moist and mucous membranes are normal. No oropharyngeal exudate.  Eyes: Conjunctivae and EOM are normal. Pupils are equal, round, and reactive to light. Right eye exhibits no  discharge and no hordeolum. Left eye exhibits no discharge and no hordeolum.  Patient has full ROM of both eyes with NO pain during ROM. Right eyelid is swollen and tender to touch surrounding eye. There is a scabbed area with slight induration just below eyebrow. No fluctuance or spontaneous drainage. No warmth.   Neck: Normal range of motion.  Cardiovascular: Normal rate, regular rhythm and normal heart sounds.   Pulmonary/Chest: Effort normal and breath sounds normal.  Lymphadenopathy:    She has no cervical adenopathy.  Neurological: She is alert and oriented to person, place, and time.  Psychiatric: She has a normal mood and affect. Her behavior is normal. Judgment and thought content normal.      Results for orders placed in visit on 08/15/12  POCT CBC      Result Value Range   WBC 6.4  4.6 - 10.2 K/uL   Lymph, poc 1.8  0.6 - 3.4   POC LYMPH PERCENT 27.9  10 - 50 %L   MID (cbc) 0.4  0 - 0.9   POC MID % 6.4  0 - 12 %M   POC Granulocyte 4.2  2 - 6.9   Granulocyte percent 65.7  37 - 80 %G   RBC 4.12  4.04 - 5.48 M/uL   Hemoglobin 13.0  12.2 - 16.2 g/dL   HCT, POC 52.8  41.3 - 47.9 %   MCV 94.6  80 - 97 fL   MCH, POC 31.6 (*) 27 - 31.2 pg   MCHC 33.3  31.8 - 35.4 g/dL  RDW, POC 13.1     Platelet Count, POC 213  142 - 424 K/uL   MPV 9.8  0 - 99.8 fL       Assessment & Plan:   1. Cellulitis of skin  POCT CBC   POCT CBC   doxycycline (VIBRAMYCIN) 100 MG capsule  2. Pain around eye, right  HYDROcodone-acetaminophen (NORCO/VICODIN) 5-325 MG per tablet   HYDROcodone-acetaminophen (NORCO/VICODIN) 5-325 MG per tablet   Will treat as early abscess/cellulitis. This does not appear to be a periorbital cellulitis, but I have cautioned patient to return if she develops any fever, chills, nausea, vomiting, or pain with ROM of her eyes.  Start doxycycline 100 mg bid x 10 days Warm compresses 3-4 times daily  Follow up if symptoms have not improved in 24-48 hours, sooner if  worse.

## 2012-08-21 ENCOUNTER — Other Ambulatory Visit: Payer: Self-pay

## 2012-08-24 ENCOUNTER — Ambulatory Visit: Payer: BC Managed Care – PPO

## 2012-08-24 ENCOUNTER — Ambulatory Visit (INDEPENDENT_AMBULATORY_CARE_PROVIDER_SITE_OTHER): Payer: BC Managed Care – PPO | Admitting: Family Medicine

## 2012-08-24 VITALS — BP 112/74 | HR 85 | Temp 98.4°F | Resp 18 | Ht 62.5 in | Wt 137.2 lb

## 2012-08-24 DIAGNOSIS — M25569 Pain in unspecified knee: Secondary | ICD-10-CM

## 2012-08-24 MED ORDER — MELOXICAM 15 MG PO TABS: 15.0000 mg | ORAL_TABLET | Freq: Every day | ORAL | Status: DC

## 2012-08-24 NOTE — Progress Notes (Addendum)
[name] [name] is a 32 y.o. female who presents to [facility] today for right knee swelling. Started yesterday at work. Patient denies any specific injury but notes lots of squatting yesterday. She notes worsening onset of swelling associated with pain with squatting and climbing stairs. She denies any significant locking or catching. She denies any radiating pain fevers or chills. She's tried some over-the-counter pain medications which have been marginally helpful. She feels well otherwise.   PMH: Reviewed history of knee injury at the seventh grade. History  Substance Use Topics  . Smoking status: Never Smoker   . Smokeless tobacco: Not on file  . Alcohol Use: Yes     Comment: couple times per month   ROS as above  Medications reviewed. Current Outpatient Prescriptions  Medication Sig Dispense Refill  . albuterol (VENTOLIN HFA) 108 (90 BASE) MCG/ACT inhaler Inhale 2 puffs into the lungs every 4 (four) hours as needed for wheezing.  18 g  11  . buPROPion (WELLBUTRIN SR) 150 MG 12 hr tablet Take 1 tablet (150 mg total) by mouth 2 (two) times daily. For the first week take just once a day  60 tablet  6  . doxycycline (VIBRAMYCIN) 100 MG capsule Take 1 capsule (100 mg total) by mouth 2 (two) times daily.  20 capsule  0  . fluticasone (FLOVENT HFA) 110 MCG/ACT inhaler Inhale 2 puffs into the lungs 2 (two) times daily.  12 g  11  . HYDROcodone-acetaminophen (NORCO/VICODIN) 5-325 MG per tablet Take 1 tablet by mouth every 6 (six) hours as needed for pain.  30 tablet  0   No current facility-administered medications for this visit.    Exam:  BP 112/74  Pulse 85  Temp(Src) 98.4 F (36.9 C) (Oral)  Resp 18  Ht 5' 2.5" (1.588 m)  Wt 137 lb 3.2 oz (62.234 kg)  BMI 24.68 kg/m2  SpO2 100%  LMP 08/03/2012 Gen: Well NAD Right knee: Moderate effusion Range of motion zero 120 Tender palpation medial joint line Positive McMurray's negative Lachman's valgus stress As is capillary refill  and sensation are intact distally  4 view right knee preliminary results: Joint space narrowing of the medial compartment otherwise normal no fractures normal alignment.  No results found for this or any previous visit (from the past 72 hour(s)).  Assessment and Plan: 32 y.o. female with right knee pain and effusion.  Suspicious for medial meniscus injury. Discussed options. Offered aspiration and injection tonight. Patient declined. Will treat with meloxicam and refer to orthopedics as tolerated. Patient expresses understanding and agreement.  08/25/12 Reviewed and agree with above plan. Tle

## 2012-08-24 NOTE — Patient Instructions (Addendum)
Thank you for coming in today. Please make an appointment with either [facility] orthopedics or [facility] for [facility] orthopedic. Take the meloxicam daily as needed for pain Work as tolerated.

## 2012-09-13 ENCOUNTER — Ambulatory Visit (INDEPENDENT_AMBULATORY_CARE_PROVIDER_SITE_OTHER): Payer: BC Managed Care – PPO | Admitting: Physician Assistant

## 2012-09-13 VITALS — BP 106/64 | HR 88 | Temp 98.3°F | Resp 16 | Ht 64.0 in | Wt 137.0 lb

## 2012-09-13 DIAGNOSIS — J019 Acute sinusitis, unspecified: Secondary | ICD-10-CM

## 2012-09-13 DIAGNOSIS — R05 Cough: Secondary | ICD-10-CM

## 2012-09-13 DIAGNOSIS — N760 Acute vaginitis: Secondary | ICD-10-CM

## 2012-09-13 LAB — POCT WET PREP WITH KOH: RBC Wet Prep HPF POC: NEGATIVE

## 2012-09-13 MED ORDER — AMOXICILLIN 500 MG PO CAPS: ORAL_CAPSULE | ORAL | Status: DC

## 2012-09-13 MED ORDER — FLUCONAZOLE 150 MG PO TABS: ORAL_TABLET | ORAL | Status: DC

## 2012-09-13 MED ORDER — FLUTICASONE PROPIONATE 50 MCG/ACT NA SUSP: 2.0000 | Freq: Every day | NASAL | Status: DC

## 2012-09-13 MED ORDER — PROMETHAZINE-DM 6.25-15 MG/5ML PO SYRP: 5.0000 mL | ORAL_SOLUTION | Freq: Four times a day (QID) | ORAL | Status: DC | PRN

## 2012-09-13 NOTE — Progress Notes (Signed)
  Subjective:    Patient ID: [name] [name], female    DOB: 02/19/1981, 32 y.o.   MRN: [id number]  HPI 1 week h/o sinus congestion, sneezing, runny nose initially.  Now she is having sinus pain and pressure. The mucus she is blowing out is greenish/yellow. No f/c. She is also having a cough and wheezing that is worse at night.  Also ~1 week h/o vaginal discharge.  She is having itching and burning. She denies STD risk factors. Her last pap was in December and was ASCUS.  Review of Systems  All other systems reviewed and are negative.       Objective:   Physical Exam  Nursing note and vitals reviewed. Constitutional: She is oriented to person, place, and time. She appears well-developed and well-nourished.  HENT:  Head: Normocephalic and atraumatic.  Mouth/Throat: No oropharyngeal exudate.  B TM congested and with fluid.  Turbinates red and inflamed. Maxillary sinuses dull with illumination and TTP.  Neck: Normal range of motion. Neck supple.  Cardiovascular: Normal rate, regular rhythm and normal heart sounds.   Pulmonary/Chest: Effort normal and breath sounds normal.  Lymphadenopathy:    She has no cervical adenopathy.  Neurological: She is alert and oriented to person, place, and time.  Skin: Skin is warm and dry.  Psychiatric: She has a normal mood and affect. Her behavior is normal.   Results for orders placed in visit on 09/13/12  POCT WET PREP WITH KOH      Result Value Range   Trichomonas, UA Negative     Clue Cells Wet Prep HPF POC 0-2     Epithelial Wet Prep HPF POC 5-8     Yeast Wet Prep HPF POC neg     Bacteria Wet Prep HPF POC 1+     RBC Wet Prep HPF POC neg     WBC Wet Prep HPF POC 0-2     KOH Prep POC Negative          Assessment & Plan:  Sinusitis and cough from drainage. Vaginal irritation with essentially normal wet prep-consider semen allergy.  Discussed some things she can try to see if she is having a reaction to semen. Meds ordered this  encounter  Medications  . fluticasone (FLONASE) 50 MCG/ACT nasal spray    Sig: Place 2 sprays into the nose daily.    Dispense:  16 g    Refill:  6    Order Specific Question:  Supervising Provider    Answer:  DOOLITTLE, ROBERT P [[id number]]  . amoxicillin (AMOXIL) 500 MG capsule    Sig: Take 2 bid    Dispense:  40 capsule    Refill:  0    Order Specific Question:  Supervising Provider    Answer:  DOOLITTLE, ROBERT P [[id number]]  . promethazine-dextromethorphan (PROMETHAZINE-DM) 6.25-15 MG/5ML syrup    Sig: Take 5 mLs by mouth 4 (four) times daily as needed for cough.    Dispense:  118 mL    Refill:  0    Order Specific Question:  Supervising Provider    Answer:  DOOLITTLE, ROBERT P [[id number]]  . fluconazole (DIFLUCAN) 150 MG tablet    Sig: 1 tab po if needed for yeast infection    Dispense:  1 tablet    Refill:  0    Order Specific Question:  Supervising Provider    Answer:  DOOLITTLE, ROBERT P [[id number]]

## 2013-02-04 DIAGNOSIS — Z0271 Encounter for disability determination: Secondary | ICD-10-CM

## 2013-05-12 ENCOUNTER — Other Ambulatory Visit: Payer: Self-pay

## 2013-05-19 ENCOUNTER — Encounter (HOSPITAL_COMMUNITY): Payer: Self-pay | Admitting: Pharmacist

## 2013-05-23 ENCOUNTER — Encounter (HOSPITAL_COMMUNITY): Payer: Self-pay

## 2013-05-23 ENCOUNTER — Encounter (HOSPITAL_COMMUNITY)
Admission: RE | Admit: 2013-05-23 | Discharge: 2013-05-23 | Disposition: A | Discharge status: Home / Self Care | Payer: Medicaid Other | Source: Ambulatory Visit | Attending: Obstetrics and Gynecology | Admitting: Obstetrics and Gynecology

## 2013-05-23 DIAGNOSIS — Z01812 Encounter for preprocedural laboratory examination: Secondary | ICD-10-CM | POA: Insufficient documentation

## 2013-05-23 DIAGNOSIS — Z01818 Encounter for other preprocedural examination: Secondary | ICD-10-CM | POA: Insufficient documentation

## 2013-05-23 LAB — CBC
HCT: 37.9 % (ref 36.0–46.0)
MCH: 31.3 pg (ref 26.0–34.0)
MCHC: 34 g/dL (ref 30.0–36.0)
MCV: 92 fL (ref 78.0–100.0)
Platelets: 218 10*3/uL (ref 150–400)
RDW: 12.3 % (ref 11.5–15.5)
WBC: 7.1 10*3/uL (ref 4.0–10.5)

## 2013-05-23 NOTE — Patient Instructions (Signed)
Your procedure is scheduled on:05/31/13  Enter through the Main Entrance at :1030 am Pick up desk phone and dial [phone] and inform us of your arrival.  Please call [phone] if you have any problems the morning of surgery.  Remember: Do not eat food after midnight: Monday Clear liquids are ok until:8am on Tuesday   You may brush your teeth the morning of surgery.  Take these meds the morning of surgery with a sip of water: Bupropion, use Advair  DO NOT wear jewelry, eye make-up, lipstick,body lotion, or dark fingernail polish.  (Polished toes are ok) You may wear deodorant.  If you are to be admitted after surgery, leave suitcase in car until your room has been assigned. Patients discharged on the day of surgery will not be allowed to drive home. Wear loose fitting, comfortable clothes for your ride home.

## 2013-05-30 NOTE — H&P (Signed)
[name] [name] is an 32 y.o. female. 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  At that time she c/o regular, heavy, painful menses as well as dyspareunia.  She has a h/o endometriosis by laparoscopy.  Medical and surgical options were discussed, she wants definitive surgical therapy.  Pertinent Gynecological History: Last pap: abnormal: LGSIL Date: 04/2013 OB History: G4, P2114  SVD at term x 3, preterm delivery x 1, one twin delivery  Menstrual History: No LMP recorded.    Past Medical History  Diagnosis Date  . Asthma   . Allergy   . Depression     Past Surgical History  Procedure Laterality Date  . Tonsillectomy and adenoidectomy    . Adiana    . Dnc    . Tubal ligation    . Dilation and curettage of uterus      Family History  Problem Relation Age of Onset  . Hypertension Mother   . Diabetes Mother   . Cancer Mother     Social History:  reports that she has never smoked. She does not have any smokeless tobacco history on file. She reports that she drinks alcohol. She reports that she does not use illicit drugs.  Allergies:  Allergies  Allergen Reactions  . Morphine And Related Nausea And Vomiting  . Nitrofurantoin Monohyd Macro Hives    No prescriptions prior to admission    Review of Systems  Respiratory: Negative.   Cardiovascular: Negative.   Gastrointestinal: Negative.   Genitourinary: Negative.     Height 5\' 2"  (1.575 m), weight 67.132 kg (148 lb). Physical Exam  Constitutional: She appears well-developed and well-nourished.  Cardiovascular: Normal rate, regular rhythm and normal heart sounds.   No murmur heard. Respiratory: Effort normal. No respiratory distress. She has no wheezes.  GI: Soft. She exhibits no distension and no mass. There is no tenderness.  Genitourinary:  EGBUS normal Vagina normal but tender in all fornices Uterus normal size, NT No adnexal mass, mild tenderness bilaterally    No  results found for this or any previous visit (from the past 24 hour(s)).  No results found.  Assessment/Plan: Menorrhagia, dysmenorrhea, dyspareunia with h/o endometriosis.  She also has CIN I.  All medical and surgical options were discussed, she wants to proceed with definitive surgical therapy.  Hysterectomy procedure, risks, alternatives, chances of relieving menstrual symptoms and dyspareunia have all been discussed.  Will admit and proceed with LAVH/bilateral salpingectomy and treat any visible endometriosis.  [name] [name] D 05/30/2013, 6:32 PM

## 2013-05-31 ENCOUNTER — Encounter (HOSPITAL_COMMUNITY): Payer: Self-pay

## 2013-05-31 ENCOUNTER — Encounter (HOSPITAL_COMMUNITY): Payer: Medicaid Other | Admitting: Anesthesiology

## 2013-05-31 ENCOUNTER — Ambulatory Visit (HOSPITAL_COMMUNITY)
Admission: RE | Admit: 2013-05-31 | Discharge: 2013-06-01 | Disposition: A | Discharge status: Home / Self Care | Payer: Medicaid Other | Source: Ambulatory Visit | Attending: Obstetrics and Gynecology | Admitting: Obstetrics and Gynecology

## 2013-05-31 ENCOUNTER — Encounter (HOSPITAL_COMMUNITY)
Admission: RE | Disposition: A | Discharge status: Home / Self Care | Payer: Self-pay | Source: Ambulatory Visit | Attending: Obstetrics and Gynecology

## 2013-05-31 ENCOUNTER — Ambulatory Visit (HOSPITAL_COMMUNITY): Payer: Medicaid Other | Admitting: Anesthesiology

## 2013-05-31 DIAGNOSIS — N801 Endometriosis of ovary: Secondary | ICD-10-CM | POA: Insufficient documentation

## 2013-05-31 DIAGNOSIS — N803 Endometriosis of pelvic peritoneum, unspecified: Secondary | ICD-10-CM | POA: Insufficient documentation

## 2013-05-31 DIAGNOSIS — B977 Papillomavirus as the cause of diseases classified elsewhere: Secondary | ICD-10-CM | POA: Insufficient documentation

## 2013-05-31 DIAGNOSIS — N920 Excessive and frequent menstruation with regular cycle: Secondary | ICD-10-CM | POA: Diagnosis present

## 2013-05-31 DIAGNOSIS — N80109 Endometriosis of ovary, unspecified side, unspecified depth: Secondary | ICD-10-CM | POA: Insufficient documentation

## 2013-05-31 DIAGNOSIS — IMO0002 Reserved for concepts with insufficient information to code with codable children: Secondary | ICD-10-CM | POA: Diagnosis present

## 2013-05-31 DIAGNOSIS — N946 Dysmenorrhea, unspecified: Secondary | ICD-10-CM | POA: Diagnosis present

## 2013-05-31 DIAGNOSIS — N8000 Endometriosis of the uterus, unspecified: Secondary | ICD-10-CM | POA: Insufficient documentation

## 2013-05-31 HISTORY — PX: BILATERAL SALPINGECTOMY: SHX5743

## 2013-05-31 HISTORY — PX: LAPAROSCOPIC ASSISTED VAGINAL HYSTERECTOMY: SHX5398

## 2013-05-31 SURGERY — HYSTERECTOMY, VAGINAL, LAPAROSCOPY-ASSISTED
Anesthesia: General | Site: Vagina | Wound class: Clean Contaminated

## 2013-05-31 MED ORDER — ACETAMINOPHEN 160 MG/5ML PO SOLN
ORAL | Status: AC
  Filled 2013-05-31: qty 40.6

## 2013-05-31 MED ORDER — ONDANSETRON HCL 4 MG/2ML IJ SOLN
INTRAMUSCULAR | Status: DC | PRN
  Administered 2013-05-31: 4 mg via INTRAVENOUS

## 2013-05-31 MED ORDER — BUPIVACAINE HCL (PF) 0.25 % IJ SOLN
INTRAMUSCULAR | Status: DC | PRN
  Administered 2013-05-31: 10 mL

## 2013-05-31 MED ORDER — ONDANSETRON HCL 4 MG/2ML IJ SOLN
INTRAMUSCULAR | Status: AC
  Filled 2013-05-31: qty 2

## 2013-05-31 MED ORDER — SODIUM CHLORIDE 0.9 % IJ SOLN: 9.0000 mL | INTRAMUSCULAR | Status: DC | PRN

## 2013-05-31 MED ORDER — LACTATED RINGERS IR SOLN
Status: DC | PRN
  Administered 2013-05-31: 3000 mL

## 2013-05-31 MED ORDER — PHENYLEPHRINE HCL 10 MG/ML IJ SOLN
INTRAMUSCULAR | Status: DC | PRN
  Administered 2013-05-31: 80 ug via INTRAVENOUS

## 2013-05-31 MED ORDER — PHENYLEPHRINE 40 MCG/ML (10ML) SYRINGE FOR IV PUSH (FOR BLOOD PRESSURE SUPPORT)
PREFILLED_SYRINGE | INTRAVENOUS | Status: AC
  Filled 2013-05-31: qty 5

## 2013-05-31 MED ORDER — DEXAMETHASONE SODIUM PHOSPHATE 10 MG/ML IJ SOLN
INTRAMUSCULAR | Status: AC
  Filled 2013-05-31: qty 1

## 2013-05-31 MED ORDER — ALUM & MAG HYDROXIDE-SIMETH 200-200-20 MG/5ML PO SUSP: 30.0000 mL | ORAL | Status: DC | PRN

## 2013-05-31 MED ORDER — ACETAMINOPHEN 160 MG/5ML PO SOLN
975.0000 mg | Freq: Once | ORAL | Status: AC
  Administered 2013-05-31: 975 mg via ORAL

## 2013-05-31 MED ORDER — SODIUM CHLORIDE 0.9 % IJ SOLN
INTRAMUSCULAR | Status: DC | PRN
  Administered 2013-05-31: 10 mL

## 2013-05-31 MED ORDER — MEPERIDINE HCL 25 MG/ML IJ SOLN: 6.2500 mg | INTRAMUSCULAR | Status: DC | PRN

## 2013-05-31 MED ORDER — DEXAMETHASONE SODIUM PHOSPHATE 10 MG/ML IJ SOLN
INTRAMUSCULAR | Status: DC | PRN
  Administered 2013-05-31: 10 mg via INTRAVENOUS

## 2013-05-31 MED ORDER — VASOPRESSIN 20 UNIT/ML IJ SOLN
INTRAMUSCULAR | Status: AC
  Filled 2013-05-31: qty 1

## 2013-05-31 MED ORDER — MENTHOL 3 MG MT LOZG: 1.0000 | LOZENGE | OROMUCOSAL | Status: DC | PRN

## 2013-05-31 MED ORDER — FENTANYL CITRATE 0.05 MG/ML IJ SOLN
INTRAMUSCULAR | Status: AC
  Filled 2013-05-31: qty 5

## 2013-05-31 MED ORDER — HEPARIN SODIUM (PORCINE) 5000 UNIT/ML IJ SOLN
INTRAMUSCULAR | Status: AC
  Filled 2013-05-31: qty 1

## 2013-05-31 MED ORDER — LACTATED RINGERS IV SOLN: INTRAVENOUS | Status: DC

## 2013-05-31 MED ORDER — PNEUMOCOCCAL VAC POLYVALENT 25 MCG/0.5ML IJ INJ
0.5000 mL | INJECTION | INTRAMUSCULAR | Status: DC
  Filled 2013-05-31: qty 0.5

## 2013-05-31 MED ORDER — INFLUENZA VAC SPLIT QUAD 0.5 ML IM SUSP
0.5000 mL | INTRAMUSCULAR | Status: AC
  Administered 2013-06-01: 0.5 mL via INTRAMUSCULAR

## 2013-05-31 MED ORDER — CEFAZOLIN SODIUM-DEXTROSE 2-3 GM-% IV SOLR
2.0000 g | INTRAVENOUS | Status: AC
  Administered 2013-05-31: 2 g via INTRAVENOUS

## 2013-05-31 MED ORDER — ROCURONIUM BROMIDE 100 MG/10ML IV SOLN
INTRAVENOUS | Status: DC | PRN
  Administered 2013-05-31: 50 mg via INTRAVENOUS

## 2013-05-31 MED ORDER — FENTANYL CITRATE 0.05 MG/ML IJ SOLN
INTRAMUSCULAR | Status: DC | PRN
  Administered 2013-05-31 (×5): 50 ug via INTRAVENOUS

## 2013-05-31 MED ORDER — VASOPRESSIN 20 UNIT/ML IJ SOLN
INTRAVENOUS | Status: DC | PRN
  Administered 2013-05-31: 14:00:00 via INTRAMUSCULAR

## 2013-05-31 MED ORDER — HYDROMORPHONE HCL PF 1 MG/ML IJ SOLN
INTRAMUSCULAR | Status: AC
  Filled 2013-05-31: qty 1

## 2013-05-31 MED ORDER — OXYCODONE-ACETAMINOPHEN 5-325 MG PO TABS
1.0000 | ORAL_TABLET | ORAL | Status: DC | PRN
  Administered 2013-06-01 (×2): 1 via ORAL
  Filled 2013-05-31 (×2): qty 1

## 2013-05-31 MED ORDER — CEFAZOLIN SODIUM-DEXTROSE 2-3 GM-% IV SOLR
INTRAVENOUS | Status: AC
  Filled 2013-05-31: qty 50

## 2013-05-31 MED ORDER — GLYCOPYRROLATE 0.2 MG/ML IJ SOLN
INTRAMUSCULAR | Status: DC | PRN
  Administered 2013-05-31: .45 mg via INTRAVENOUS

## 2013-05-31 MED ORDER — ZOLPIDEM TARTRATE 5 MG PO TABS: 5.0000 mg | ORAL_TABLET | Freq: Every evening | ORAL | Status: DC | PRN

## 2013-05-31 MED ORDER — MIDAZOLAM HCL 2 MG/2ML IJ SOLN
INTRAMUSCULAR | Status: AC
  Filled 2013-05-31: qty 2

## 2013-05-31 MED ORDER — KETOROLAC TROMETHAMINE 30 MG/ML IJ SOLN: 15.0000 mg | Freq: Once | INTRAMUSCULAR | Status: DC | PRN

## 2013-05-31 MED ORDER — DIPHENHYDRAMINE HCL 12.5 MG/5ML PO ELIX: 12.5000 mg | ORAL_SOLUTION | Freq: Four times a day (QID) | ORAL | Status: DC | PRN

## 2013-05-31 MED ORDER — HYDROMORPHONE 0.3 MG/ML IV SOLN
INTRAVENOUS | Status: DC
  Administered 2013-05-31: 18:00:00 via INTRAVENOUS
  Administered 2013-05-31: 1.79 mg via INTRAVENOUS
  Administered 2013-06-01: 0.799 mg via INTRAVENOUS
  Administered 2013-06-01: 1.39 mg via INTRAVENOUS
  Filled 2013-05-31: qty 25

## 2013-05-31 MED ORDER — KETOROLAC TROMETHAMINE 30 MG/ML IJ SOLN
INTRAMUSCULAR | Status: DC | PRN
  Administered 2013-05-31: 30 mg via INTRAVENOUS

## 2013-05-31 MED ORDER — LACTATED RINGERS IV SOLN
INTRAVENOUS | Status: DC
  Administered 2013-05-31: 13:00:00 via INTRAVENOUS
  Administered 2013-05-31: 50 mL/h via INTRAVENOUS

## 2013-05-31 MED ORDER — BUPROPION HCL ER (SR) 150 MG PO TB12
150.0000 mg | ORAL_TABLET | Freq: Two times a day (BID) | ORAL | Status: DC
  Administered 2013-06-01: 150 mg via ORAL
  Filled 2013-05-31 (×2): qty 1

## 2013-05-31 MED ORDER — SCOPOLAMINE 1 MG/3DAYS TD PT72
1.0000 | MEDICATED_PATCH | TRANSDERMAL | Status: DC
Start: 2013-05-31 — End: 2013-05-31
  Administered 2013-05-31: 1.5 mg via TRANSDERMAL

## 2013-05-31 MED ORDER — GLYCOPYRROLATE 0.2 MG/ML IJ SOLN
INTRAMUSCULAR | Status: AC
  Filled 2013-05-31: qty 3

## 2013-05-31 MED ORDER — MOMETASONE FURO-FORMOTEROL FUM 100-5 MCG/ACT IN AERO
2.0000 | INHALATION_SPRAY | Freq: Two times a day (BID) | RESPIRATORY_TRACT | Status: DC
  Administered 2013-05-31 – 2013-06-01 (×2): 2 via RESPIRATORY_TRACT
  Filled 2013-05-31: qty 8.8

## 2013-05-31 MED ORDER — DEXTROSE-NACL 5-0.45 % IV SOLN
INTRAVENOUS | Status: DC
  Administered 2013-05-31 – 2013-06-01 (×2): via INTRAVENOUS

## 2013-05-31 MED ORDER — ROCURONIUM BROMIDE 100 MG/10ML IV SOLN
INTRAVENOUS | Status: AC
  Filled 2013-05-31: qty 1

## 2013-05-31 MED ORDER — SODIUM CHLORIDE 0.9 % IJ SOLN
INTRAMUSCULAR | Status: AC
  Filled 2013-05-31: qty 10

## 2013-05-31 MED ORDER — ONDANSETRON HCL 4 MG/2ML IJ SOLN
4.0000 mg | Freq: Four times a day (QID) | INTRAMUSCULAR | Status: DC | PRN
  Administered 2013-05-31 – 2013-06-01 (×2): 4 mg via INTRAVENOUS
  Filled 2013-05-31 (×2): qty 2

## 2013-05-31 MED ORDER — METOCLOPRAMIDE HCL 5 MG/ML IJ SOLN: 10.0000 mg | Freq: Once | INTRAMUSCULAR | Status: DC | PRN

## 2013-05-31 MED ORDER — NEOSTIGMINE METHYLSULFATE 1 MG/ML IJ SOLN
INTRAMUSCULAR | Status: AC
  Filled 2013-05-31: qty 1

## 2013-05-31 MED ORDER — NEOSTIGMINE METHYLSULFATE 1 MG/ML IJ SOLN
INTRAMUSCULAR | Status: DC | PRN
  Administered 2013-05-31: 3 mg via INTRAVENOUS

## 2013-05-31 MED ORDER — BUPIVACAINE HCL (PF) 0.25 % IJ SOLN
INTRAMUSCULAR | Status: AC
  Filled 2013-05-31: qty 30

## 2013-05-31 MED ORDER — SCOPOLAMINE 1 MG/3DAYS TD PT72
MEDICATED_PATCH | TRANSDERMAL | Status: AC
  Administered 2013-05-31: 1.5 mg via TRANSDERMAL
  Filled 2013-05-31: qty 1

## 2013-05-31 MED ORDER — LIDOCAINE HCL (CARDIAC) 20 MG/ML IV SOLN
INTRAVENOUS | Status: AC
  Filled 2013-05-31: qty 5

## 2013-05-31 MED ORDER — KETOROLAC TROMETHAMINE 30 MG/ML IJ SOLN: 30.0000 mg | Freq: Once | INTRAMUSCULAR | Status: DC

## 2013-05-31 MED ORDER — MIDAZOLAM HCL 2 MG/2ML IJ SOLN
INTRAMUSCULAR | Status: DC | PRN
  Administered 2013-05-31: 2 mg via INTRAVENOUS

## 2013-05-31 MED ORDER — SIMETHICONE 80 MG PO CHEW: 80.0000 mg | CHEWABLE_TABLET | Freq: Four times a day (QID) | ORAL | Status: DC | PRN

## 2013-05-31 MED ORDER — HYDROMORPHONE HCL PF 1 MG/ML IJ SOLN
0.2500 mg | INTRAMUSCULAR | Status: DC | PRN
  Administered 2013-05-31 (×6): 0.5 mg via INTRAVENOUS

## 2013-05-31 MED ORDER — DIPHENHYDRAMINE HCL 50 MG/ML IJ SOLN
12.5000 mg | Freq: Four times a day (QID) | INTRAMUSCULAR | Status: DC | PRN
  Administered 2013-05-31 – 2013-06-01 (×2): 12.5 mg via INTRAVENOUS
  Filled 2013-05-31 (×2): qty 1

## 2013-05-31 MED ORDER — PROPOFOL 10 MG/ML IV BOLUS
INTRAVENOUS | Status: DC | PRN
  Administered 2013-05-31: 170 mg via INTRAVENOUS

## 2013-05-31 MED ORDER — LIDOCAINE HCL (CARDIAC) 20 MG/ML IV SOLN
INTRAVENOUS | Status: DC | PRN
  Administered 2013-05-31: 30 mg via INTRAVENOUS
  Administered 2013-05-31: 70 mg via INTRAVENOUS

## 2013-05-31 MED ORDER — PROPOFOL 10 MG/ML IV EMUL
INTRAVENOUS | Status: AC
  Filled 2013-05-31: qty 20

## 2013-05-31 MED ORDER — SODIUM CHLORIDE 0.9 % IJ SOLN
INTRAMUSCULAR | Status: AC
  Filled 2013-05-31: qty 50

## 2013-05-31 MED ORDER — KETOROLAC TROMETHAMINE 30 MG/ML IJ SOLN
INTRAMUSCULAR | Status: AC
  Filled 2013-05-31: qty 1

## 2013-05-31 MED ORDER — NALOXONE HCL 0.4 MG/ML IJ SOLN: 0.4000 mg | INTRAMUSCULAR | Status: DC | PRN

## 2013-05-31 SURGICAL SUPPLY — 36 items
CANISTER SUCT 3000ML (MISCELLANEOUS) ×4 IMPLANT
CATH ROBINSON RED A/P 16FR (CATHETERS) ×4 IMPLANT
CHLORAPREP W/TINT 26ML (MISCELLANEOUS) ×4 IMPLANT
CLOTH BEACON ORANGE TIMEOUT ST (SAFETY) ×4 IMPLANT
CONT PATH 16OZ SNAP LID 3702 (MISCELLANEOUS) ×4 IMPLANT
COVER TABLE BACK 60X90 (DRAPES) ×4 IMPLANT
DECANTER SPIKE VIAL GLASS SM (MISCELLANEOUS) ×12 IMPLANT
DERMABOND ADVANCED (GAUZE/BANDAGES/DRESSINGS) ×1
DERMABOND ADVANCED .7 DNX12 (GAUZE/BANDAGES/DRESSINGS) ×3 IMPLANT
DRAPE HYSTEROSCOPY (DRAPE) ×4 IMPLANT
ELECT REM PT RETURN 9FT ADLT (ELECTROSURGICAL) ×4
ELECTRODE REM PT RTRN 9FT ADLT (ELECTROSURGICAL) ×3 IMPLANT
GLOVE BIO SURGEON STRL SZ8 (GLOVE) ×8 IMPLANT
GLOVE BIOGEL PI IND STRL 6.5 (GLOVE) ×9 IMPLANT
GLOVE BIOGEL PI INDICATOR 6.5 (GLOVE) ×3
GLOVE ORTHO TXT STRL SZ7.5 (GLOVE) ×12 IMPLANT
GOWN PREVENTION PLUS LG XLONG (DISPOSABLE) ×4 IMPLANT
GOWN STRL REIN XL XLG (GOWN DISPOSABLE) ×16 IMPLANT
NEEDLE INSUFFLATION 120MM (ENDOMECHANICALS) ×4 IMPLANT
NS IRRIG 1000ML POUR BTL (IV SOLUTION) ×4 IMPLANT
PACK LAVH (CUSTOM PROCEDURE TRAY) ×4 IMPLANT
PACK VAGINAL MINOR WOMEN LF (CUSTOM PROCEDURE TRAY) IMPLANT
PROTECTOR NERVE ULNAR (MISCELLANEOUS) ×8 IMPLANT
SCALPEL HARMONIC ACE (MISCELLANEOUS) ×4 IMPLANT
SET CYSTO W/LG BORE CLAMP LF (SET/KITS/TRAYS/PACK) IMPLANT
SET IRRIG TUBING LAPAROSCOPIC (IRRIGATION / IRRIGATOR) ×4 IMPLANT
SUT CHROMIC 1MO 4 18 CR8 (SUTURE) ×8 IMPLANT
SUT CHROMIC GUT AB #0 18 (SUTURE) ×4 IMPLANT
SUT SILK 2 0 SH (SUTURE) ×4 IMPLANT
SUT VIC AB 2-0 CT1 (SUTURE) ×4 IMPLANT
SUT VICRYL 4-0 PS2 18IN ABS (SUTURE) ×8 IMPLANT
TOWEL OR 17X24 6PK STRL BLUE (TOWEL DISPOSABLE) ×8 IMPLANT
TRAY FOLEY CATH 14FR (SET/KITS/TRAYS/PACK) ×4 IMPLANT
TROCAR XCEL NON-BLD 5MMX100MML (ENDOMECHANICALS) ×12 IMPLANT
WARMER LAPAROSCOPE (MISCELLANEOUS) ×4 IMPLANT
WATER STERILE IRR 1000ML POUR (IV SOLUTION) IMPLANT

## 2013-05-31 NOTE — Transfer of Care (Signed)
Immediate Anesthesia Transfer of Care Note  Patient: [name] [name]  Procedure(s) Performed: Procedure(s): LAPAROSCOPIC ASSISTED VAGINAL HYSTERECTOMY (N/A) BILATERAL SALPINGECTOMY (Bilateral)  Patient Location: PACU  Anesthesia Type:General  Level of Consciousness: awake, oriented and patient cooperative  Airway & Oxygen Therapy: Patient Spontanous Breathing and Patient connected to nasal cannula oxygen  Post-op Assessment: Report given to PACU RN and Post -op Vital signs reviewed and stable  Post vital signs: Reviewed and stable  Complications: No apparent anesthesia complications

## 2013-05-31 NOTE — Op Note (Signed)
Preoperative diagnosis: Menorrhagia, dysmenorrhea, dyspareunia Postoperative diagnosis:  Same and endometriosis  Procedure: Laparoscopic-assisted vaginal hysterectomy, bilateral salpingectomy, fulguration of endometriosis Surgeon: Lavina Hamman M.D. Assistant: Sherron Monday, MD Anesthesia: Gen. Endotracheal tube Findings: She had a normal upper abdomen. She had a normal uterus, tubes and ovaries. There was endometriosis on both uterosacral ligaments and the left ovarian fossa over the ureter. Estimated blood loss: 200 cc Specimens: Uterus sent for routine pathology Complications: None  Procedure in detail: The patient was taken to the operating room and placed in the dorsosupine position. General anesthesia was induced and legs were placed in mobile stirrups and arms tucked to her sides. Abdomen and perineum were then prepped and draped in usual sterile fashion, bladder drained with a red Robinson catheter, Hulka tenaculum applied to the cervix for uterine manipulation. Infraumbilical skin was then infiltrated with quarter percent Marcaine and a 1 cm vertical incision was made. Veress needle was inserted into the peritoneal cavity and placement confirmed by the water drop test an opening pressure of 6 mm mercury. CO2 was insufflated to a pressure of 13 mm mercury and the Veress needle was removed. A 5 mm trocar was introduced with direct visualization. A 5 mm port was then placed on the left side also under direct visualization. Inspection revealed the above-mentioned findings. A third 5 mm port was placed on the right side also under direct visualization. The Harmonic scalpel Ace was used to fulgurate the areas of endometriosis except for the area that was overlying the left ureter. The distal right uterine fallopian tube was grasped with a 5 mm grasper. The Harmonic Scalpel ACE was then used to take down the right mesosalpinx to isolate the right tube.  The uterine cornu was then grasped with a 5 mm  tenaculum, and we came through the right utero-ovarian ligament, round ligament, right broad ligament and incised the anterior peritoneum across the anterior lower part of the uterus. A similar procedure was then performed on the left side taking down the mesosalpinx, utero-ovarian pedicle, round ligament, broad ligament. There was some bleeding from the left uterine artery on this side which was controlled to Harmonic Scalpel ACE. Anterior peritoneum was incised across the anterior part of the uterus to meet the incision coming from the right side. At this point the uterus was fairly free and there is adequate hemostasis to proceed vaginally.  The legs were elevated in stirrups. A weighted speculum was inserted in the vagina. The cervix was grasped with Christella Hartigan tenaculums. A dilute solution of Pitressin was infiltrated around the cervicovaginal junction which was then incised circumferentially with electrocautery. Sharp dissection was then used to further free the vagina from the cervix. Anterior peritoneum was identified and entered sharply. A Deaver retractor was then to retract the bladder anterior. Posterior cul-de-sac was identified and entered sharply. A Bonnano speculum was placed into the posterior cul-de-sac. Uterosacral ligaments were clamped transected and ligated with #1 chromic and tagged for later use. Cardinal ligaments and uterine arteries were likewise clamped transected and ligated with #1 chromic. The remaining pedicles were clamped transected and ligated and the uterus was removed.  Bleeding from each side was controlled with #1 Chromic for adequate hemostasis. The uterosacral ligaments were plicated in the midline with 0 silk and the previously tagged uterosacral pedicles were also tied in the midline. The vaginal cuff was then closed in a vertical fashion with running locking 2-0 Vicryl with adequate closure and adequate hemostasis. A Foley catheter was then placed.  Attention was  turned  back to the abdomen. Dr. Ellyn Hack and the scrub tech and myself all changed gloves. The abdomen was reinsufflated. Laparoscope was reinserted and good visualization was achieved. The pelvis was copiously irrigated, no significant bleeding was identified. Both ureters were identified and found to be below incision lines. The 5 mm ports on the each side were removed under direct visualization. The laparoscope was then removed and all gas was allowed to deflate from the abdomen. The umbilical trocar was then removed. Skin incisions were closed with interrupted subcuticular sutures of 4-0 Vicryl followed by Dermabond. The patient was awakened in the operating room and taken to the recovery room in stable condition after tolerating the procedure well. Counts were correct x2, she received Ancef 2 g IV the beginning of the procedure, she had PAS hose on throughout the procedure.

## 2013-05-31 NOTE — Anesthesia Preprocedure Evaluation (Signed)
Anesthesia Evaluation  Patient identified by MRN, date of birth, ID band Patient awake    Reviewed: Allergy & Precautions, H&P , NPO status , Patient's Chart, lab work & pertinent test results, reviewed documented beta blocker date and time   History of Anesthesia Complications Negative for: history of anesthetic complications  Airway Mallampati: I TM Distance: >3 FB Neck ROM: full    Dental  (+) Teeth Intact   Pulmonary asthma (last rescue inhaler use 2 weeks ago wtih a cold) ,  breath sounds clear to auscultation  Pulmonary exam normal       Cardiovascular negative cardio ROS  Rhythm:regular Rate:Normal     Neuro/Psych PSYCHIATRIC DISORDERS (depression, PTSD, panic d/o) negative neurological ROS     GI/Hepatic Neg liver ROS, GERD- (zantac)  Medicated,  Endo/Other  negative endocrine ROS  Renal/GU Renal disease (kidney stones)  Female GU complaint     Musculoskeletal   Abdominal   Peds  Hematology negative hematology ROS (+)   Anesthesia Other Findings   Reproductive/Obstetrics negative OB ROS                           Anesthesia Physical Anesthesia Plan  ASA: II  Anesthesia Plan: General ETT   Post-op Pain Management:    Induction:   Airway Management Planned:   Additional Equipment:   Intra-op Plan:   Post-operative Plan:   Informed Consent: I have reviewed the patients History and Physical, chart, labs and discussed the procedure including the risks, benefits and alternatives for the proposed anesthesia with the patient or authorized representative who has indicated his/her understanding and acceptance.   Dental Advisory Given  Plan Discussed with: CRNA and Surgeon  Anesthesia Plan Comments:         Anesthesia Quick Evaluation

## 2013-05-31 NOTE — Interval H&P Note (Signed)
History and Physical Interval Note:  05/31/2013 12:06 PM  [name] [name]  has presented today for surgery, with the diagnosis of Dysmenorrhea and Menorrhagia  The various methods of treatment have been discussed with the patient and family. After consideration of risks, benefits and other options for treatment, the patient has consented to  Procedure(s): LAPAROSCOPIC ASSISTED VAGINAL HYSTERECTOMY (N/A) BILATERAL SALPINGECTOMY (Bilateral) CYSTOSCOPY (N/A) as a surgical intervention .  The patient's history has been reviewed, patient examined, no change in status, stable for surgery.  I have reviewed the patient's chart and labs.  Questions were answered to the patient's satisfaction.     Clyde Zarrella D

## 2013-05-31 NOTE — Anesthesia Postprocedure Evaluation (Signed)
  Anesthesia Post Note  Patient: [name] [name]  Procedure(s) Performed: Procedure(s) (LRB): LAPAROSCOPIC ASSISTED VAGINAL HYSTERECTOMY (N/A) BILATERAL SALPINGECTOMY (Bilateral)  Anesthesia type: GA  Patient location: PACU  Post pain: Pain level controlled  Post assessment: Post-op Vital signs reviewed  Last Vitals:  Filed Vitals:   05/31/13 1445  BP: 114/68  Pulse: 97  Temp:   Resp: 14    Post vital signs: Reviewed  Level of consciousness: sedated  Complications: No apparent anesthesia complications

## 2013-05-31 NOTE — Progress Notes (Signed)
Patient ID: [name] [name], female   DOB: 01-03-1981, 32 y.o.   MRN: [id number] Pt is awake and alert. VS are stable. Urine is yellow and clear but amount is low. Output 90 cc's over the last 100 minutes.

## 2013-06-01 ENCOUNTER — Encounter (HOSPITAL_COMMUNITY): Payer: Self-pay | Admitting: Obstetrics and Gynecology

## 2013-06-01 LAB — CBC
HCT: 31.3 % — ABNORMAL LOW (ref 36.0–46.0)
Hemoglobin: 10.6 g/dL — ABNORMAL LOW (ref 12.0–15.0)
MCH: 30.7 pg (ref 26.0–34.0)
MCHC: 33.9 g/dL (ref 30.0–36.0)
MCV: 90.7 fL (ref 78.0–100.0)
Platelets: 169 10*3/uL (ref 150–400)
RDW: 12.2 % (ref 11.5–15.5)
WBC: 10.5 10*3/uL (ref 4.0–10.5)

## 2013-06-01 MED ORDER — HYDROXYZINE HCL 10 MG PO TABS: 10.0000 mg | ORAL_TABLET | Freq: Three times a day (TID) | ORAL | Status: DC | PRN

## 2013-06-01 MED ORDER — OXYCODONE-ACETAMINOPHEN 5-325 MG PO TABS: 1.0000 | ORAL_TABLET | ORAL | Status: DC | PRN

## 2013-06-01 MED ORDER — HYDROXYZINE HCL 10 MG PO TABS
10.0000 mg | ORAL_TABLET | Freq: Three times a day (TID) | ORAL | Status: DC | PRN
  Administered 2013-06-01: 10 mg via ORAL
  Filled 2013-06-01: qty 1

## 2013-06-01 NOTE — Discharge Summary (Signed)
Physician Discharge Summary  Patient ID: [name] [name] MRN: [id number] DOB/AGE: Oct 04, 1980 32 y.o.  Admit date: 05/31/2013 Discharge date: 06/01/2013  Admission Diagnoses:  Menorrhagia, dysmenorrhea, dyspareunia  Discharge Diagnoses:  Same and endometriosis Active Problems:   Menorrhagia   Dysmenorrhea   Dyspareunia   Discharged Condition: good  Hospital Course: Admitted for LAVH/bilateral salpingectomy, endometriosis found and fulgurated as well.  No complications after surgery.  Treatments: surgery: LAVH/bil salpingectomy, fulguration of endometriosis  Discharge Exam: Blood pressure 95/51, pulse 88, temperature 98.9 F (37.2 C), temperature source Oral, resp. rate 18, height 5\' 2"  (1.575 m), weight 68.04 kg (150 lb), SpO2 100.00%. General appearance: alert  Disposition: 01-Home or Self Care  Discharge Orders   Future Orders Complete By Expires   Diet - low sodium heart healthy  As directed    Increase activity slowly  As directed    Lifting restrictions  As directed    Comments:     10 lbs   Sexual Activity Restrictions  As directed    Comments:     Pelvic rest       Medication List         buPROPion 150 MG 12 hr tablet  Commonly known as:  WELLBUTRIN SR  Take 1 tablet (150 mg total) by mouth 2 (two) times daily. For the first week take just once a day     DAYQUIL PO  Take 2 capsules by mouth every morning.     Fluticasone-Salmeterol 250-50 MCG/DOSE Aepb  Commonly known as:  ADVAIR  Inhale 2 puffs into the lungs 2 (two) times daily.     hydrOXYzine 10 MG tablet  Commonly known as:  ATARAX/VISTARIL  Take 1 tablet (10 mg total) by mouth 3 (three) times daily as needed for itching.     NYQUIL PO  Take 2 capsules by mouth at bedtime.     oxyCODONE-acetaminophen 5-325 MG per tablet  Commonly known as:  PERCOCET/ROXICET  Take 1-2 tablets by mouth every 4 (four) hours as needed for severe pain (moderate to severe pain (when tolerating fluids)).            Follow-up Information   Follow up with Kalliopi Coupland D, MD. Schedule an appointment as soon as possible for a visit in 2 weeks.   Specialty:  Obstetrics and Gynecology   Contact information:   [address] [place] [address] [phone]       Signed: Zenaida Niece 06/01/2013, 8:37 AM

## 2013-06-01 NOTE — Anesthesia Postprocedure Evaluation (Signed)
Anesthesia Post Note  Patient: [name] [name]  Procedure(s) Performed: Procedure(s) (LRB): LAPAROSCOPIC ASSISTED VAGINAL HYSTERECTOMY (N/A) BILATERAL SALPINGECTOMY (Bilateral)  Anesthesia type: General  Patient location: [facility]  Post pain: Pain level controlled  Post assessment: Post-op Vital signs reviewed  Last Vitals:  Filed Vitals:   06/01/13 0523  BP: 95/51  Pulse: 88  Temp: 37.2 C  Resp: 18    Post vital signs: Reviewed  Level of consciousness: sedated  Complications: No apparent anesthesia complications

## 2013-06-01 NOTE — Progress Notes (Signed)
POD #1 Doing well, pain ok, no n/v, foley is out Afeb, VSS Abd- soft, incisions intact Appropriate drop in Hgb Ambulate, d/c home later today if no problems

## 2013-07-07 HISTORY — PX: BREAST CYST ASPIRATION: SHX578

## 2013-09-26 ENCOUNTER — Other Ambulatory Visit: Payer: Self-pay

## 2013-09-26 DIAGNOSIS — Z1231 Encounter for screening mammogram for malignant neoplasm of breast: Secondary | ICD-10-CM

## 2013-09-26 DIAGNOSIS — Z803 Family history of malignant neoplasm of breast: Secondary | ICD-10-CM

## 2013-10-03 ENCOUNTER — Ambulatory Visit: Payer: BC Managed Care – PPO

## 2013-10-03 ENCOUNTER — Ambulatory Visit
Admission: RE | Admit: 2013-10-03 | Discharge: 2013-10-03 | Disposition: A | Discharge status: Home / Self Care | Payer: Medicaid Other | Source: Ambulatory Visit

## 2013-10-03 DIAGNOSIS — Z1231 Encounter for screening mammogram for malignant neoplasm of breast: Secondary | ICD-10-CM

## 2013-10-03 DIAGNOSIS — Z803 Family history of malignant neoplasm of breast: Secondary | ICD-10-CM

## 2013-10-06 ENCOUNTER — Other Ambulatory Visit: Payer: Self-pay | Admitting: Physician Assistant

## 2013-10-06 DIAGNOSIS — R928 Other abnormal and inconclusive findings on diagnostic imaging of breast: Secondary | ICD-10-CM

## 2013-10-14 ENCOUNTER — Ambulatory Visit
Admission: RE | Admit: 2013-10-14 | Discharge: 2013-10-14 | Disposition: A | Discharge status: Home / Self Care | Payer: Medicaid Other | Source: Ambulatory Visit | Attending: Physician Assistant | Admitting: Physician Assistant

## 2013-10-14 ENCOUNTER — Other Ambulatory Visit: Payer: Self-pay | Admitting: Physician Assistant

## 2013-10-14 DIAGNOSIS — R928 Other abnormal and inconclusive findings on diagnostic imaging of breast: Secondary | ICD-10-CM

## 2013-10-14 DIAGNOSIS — N6001 Solitary cyst of right breast: Secondary | ICD-10-CM

## 2013-10-21 ENCOUNTER — Ambulatory Visit
Admission: RE | Admit: 2013-10-21 | Discharge: 2013-10-21 | Disposition: A | Discharge status: Home / Self Care | Payer: Medicaid Other | Source: Ambulatory Visit | Attending: Physician Assistant | Admitting: Physician Assistant

## 2013-10-21 DIAGNOSIS — N6001 Solitary cyst of right breast: Secondary | ICD-10-CM

## 2013-12-11 ENCOUNTER — Emergency Department (HOSPITAL_COMMUNITY)
Admission: EM | Admit: 2013-12-11 | Discharge: 2013-12-11 | Disposition: A | Discharge status: Home / Self Care | Payer: Medicaid Other | Source: Home / Self Care | Attending: Family Medicine | Admitting: Family Medicine

## 2013-12-11 ENCOUNTER — Encounter (HOSPITAL_COMMUNITY): Payer: Self-pay | Admitting: Emergency Medicine

## 2013-12-11 DIAGNOSIS — R05 Cough: Secondary | ICD-10-CM

## 2013-12-11 DIAGNOSIS — J45901 Unspecified asthma with (acute) exacerbation: Secondary | ICD-10-CM

## 2013-12-11 DIAGNOSIS — R21 Rash and other nonspecific skin eruption: Secondary | ICD-10-CM

## 2013-12-11 DIAGNOSIS — R059 Cough, unspecified: Secondary | ICD-10-CM

## 2013-12-11 DIAGNOSIS — J988 Other specified respiratory disorders: Secondary | ICD-10-CM

## 2013-12-11 HISTORY — DX: Panic disorder (episodic paroxysmal anxiety): F41.0

## 2013-12-11 MED ORDER — IPRATROPIUM-ALBUTEROL 0.5-2.5 (3) MG/3ML IN SOLN
3.0000 mL | Freq: Once | RESPIRATORY_TRACT | Status: AC
  Administered 2013-12-11: 3 mL via RESPIRATORY_TRACT

## 2013-12-11 MED ORDER — METHYLPREDNISOLONE ACETATE 80 MG/ML IJ SUSP: 80.0000 mg | Freq: Once | INTRAMUSCULAR | Status: DC

## 2013-12-11 MED ORDER — ALBUTEROL SULFATE HFA 108 (90 BASE) MCG/ACT IN AERS: 1.0000 | INHALATION_SPRAY | Freq: Four times a day (QID) | RESPIRATORY_TRACT | Status: DC | PRN

## 2013-12-11 MED ORDER — HYDROCODONE-HOMATROPINE 5-1.5 MG/5ML PO SYRP: 5.0000 mL | ORAL_SOLUTION | Freq: Four times a day (QID) | ORAL | Status: DC | PRN

## 2013-12-11 MED ORDER — METHYLPREDNISOLONE ACETATE 80 MG/ML IJ SUSP
80.0000 mg | Freq: Once | INTRAMUSCULAR | Status: AC
  Administered 2013-12-11: 80 mg via INTRAMUSCULAR

## 2013-12-11 MED ORDER — METHYLPREDNISOLONE ACETATE 80 MG/ML IJ SUSP
INTRAMUSCULAR | Status: AC
  Filled 2013-12-11: qty 1

## 2013-12-11 MED ORDER — AZITHROMYCIN 250 MG PO TABS: 250.0000 mg | ORAL_TABLET | Freq: Every day | ORAL | Status: DC

## 2013-12-11 MED ORDER — IPRATROPIUM-ALBUTEROL 0.5-2.5 (3) MG/3ML IN SOLN
RESPIRATORY_TRACT | Status: AC
  Filled 2013-12-11: qty 3

## 2013-12-11 MED ORDER — SODIUM CHLORIDE 0.9 % IN NEBU
INHALATION_SOLUTION | RESPIRATORY_TRACT | Status: AC
  Filled 2013-12-11: qty 3

## 2013-12-11 NOTE — ED Provider Notes (Signed)
Medical screening examination/treatment/procedure(s) were performed by a resident physician or non-physician practitioner and as the supervising physician I was immediately available for consultation/collaboration.  Evan Corey, MD    Evan S Corey, MD 12/11/13 1848 

## 2013-12-11 NOTE — ED Notes (Signed)
C/O cold sxs x 7 days; nasal congestion, runny nose, sore throat, post-nasal drainage, bilat eye pain, bilat ear popping, SOB, difficulty sleeping, intermittent cough.  Has been using Advair, Claritin, Flonase without relief.  2 days ago started with pruritic rash localized to left inner elbow with bug-bite-like lesions.  Has been taking Benadryl, applying hydrocortisone and Calamine lotion without relief.  BBS clear.

## 2013-12-11 NOTE — ED Provider Notes (Signed)
CSN: [id number]     Arrival date & time 12/11/13  1028 History   First MD Initiated Contact with Patient 12/11/13 1123     Chief Complaint  Patient presents with  . URI  . Rash   (Consider location/radiation/quality/duration/timing/severity/associated sxs/prior Treatment) HPI Comments: Patient is a 33 yo Hispanic female with a history of asthma who presents with mild sob, wheezing, coughing (non-productive), and now malaise for the last 2 days. This began over 7 days without noted improvement. She has been using Adv air without good relief. Also noted to have a pruritic rash to left upper arm. No known exposures.   Patient is a 33 y.o. female presenting with URI and rash. The history is provided by the patient.  URI Presenting symptoms: congestion, fever and sore throat   Presenting symptoms: no cough and no fatigue   Associated symptoms: wheezing   Rash Associated symptoms: fever, shortness of breath, sore throat and wheezing   Associated symptoms: no fatigue     Past Medical History  Diagnosis Date  . Asthma   . Allergy   . Depression   . Panic disorder   . Kidney stone    Past Surgical History  Procedure Laterality Date  . Tonsillectomy and adenoidectomy    . Adiana    . Dnc    . Tubal ligation    . Dilation and curettage of uterus    . Laparoscopic assisted vaginal hysterectomy N/A 05/31/2013    Procedure: LAPAROSCOPIC ASSISTED VAGINAL HYSTERECTOMY;  Surgeon: Cheri Fowler, MD;  Location: [facility];  Service: Gynecology;  Laterality: N/A;  . Bilateral salpingectomy Bilateral 05/31/2013    Procedure: BILATERAL SALPINGECTOMY;  Surgeon: Cheri Fowler, MD;  Location: [facility];  Service: Gynecology;  Laterality: Bilateral;  . Partial hysterectomy    . Kidney stone surgery     Family History  Problem Relation Age of Onset  . Hypertension Mother   . Diabetes Mother   . Cancer Mother    History  Substance Use Topics  . Smoking status: Never Smoker   . Smokeless tobacco:  Not on file  . Alcohol Use: Yes     Comment: couple times per month   OB History   Grav Para Term Preterm Abortions TAB SAB Ect Mult Living                 Review of Systems  Constitutional: Positive for fever. Negative for chills and fatigue.  HENT: Positive for congestion and sore throat.   Eyes: Positive for discharge and itching.  Respiratory: Positive for shortness of breath and wheezing. Negative for cough.   Genitourinary: Negative.   Skin: Positive for rash.  Allergic/Immunologic: Positive for environmental allergies.  Neurological: Negative.   Psychiatric/Behavioral: Negative.     Allergies  Morphine and related and Nitrofurantoin monohyd macro  Home Medications   Prior to Admission medications   Medication Sig Start Date End Date Taking? Authorizing Provider  Fluticasone-Salmeterol (ADVAIR) 250-50 MCG/DOSE AEPB Inhale 2 puffs into the lungs 2 (two) times daily.   Yes Historical Provider, MD  Loratadine (CLARITIN PO) Take 10 mg by mouth daily.   Yes Historical Provider, MD  oxyCODONE-acetaminophen (PERCOCET/ROXICET) 5-325 MG per tablet Take 1-2 tablets by mouth every 4 (four) hours as needed for severe pain (moderate to severe pain (when tolerating fluids)). 06/01/13  Yes Cheri Fowler, MD  albuterol (PROVENTIL HFA;VENTOLIN HFA) 108 (90 BASE) MCG/ACT inhaler Inhale 1-2 puffs into the lungs every 6 (six) hours as needed for wheezing or  shortness of breath. 12/11/13   Bjorn Pippin, PA-C  azithromycin (ZITHROMAX) 250 MG tablet Take 1 tablet (250 mg total) by mouth daily. Take first 2 tablets together, then 1 every day until finished. 12/11/13   Bjorn Pippin, PA-C  buPROPion (WELLBUTRIN SR) 150 MG 12 hr tablet Take 1 tablet (150 mg total) by mouth 2 (two) times daily. For the first week take just once a day 08/11/12   Darreld Mclean, MD  HYDROcodone-homatropine (HYCODAN) 5-1.5 MG/5ML syrup Take 5 mLs by mouth every 6 (six) hours as needed for cough. 12/11/13   Bjorn Pippin, PA-C  hydrOXYzine (ATARAX/VISTARIL) 10 MG tablet Take 1 tablet (10 mg total) by mouth 3 (three) times daily as needed for itching. 06/01/13   Cheri Fowler, MD  Pseudoeph-Doxylamine-DM-APAP (NYQUIL PO) Take 2 capsules by mouth at bedtime.    Historical Provider, MD  Pseudoephedrine-APAP-DM (DAYQUIL PO) Take 2 capsules by mouth every morning.    Historical Provider, MD   BP 113/73  Pulse 67  Temp(Src) 98.9 F (37.2 C) (Oral)  Resp 18  SpO2 100%  LMP 05/09/2013 Physical Exam  Nursing note and vitals reviewed. Constitutional: She is oriented to person, place, and time. She appears well-developed and well-nourished. No distress.  HENT:  Head: Normocephalic and atraumatic.  Eyes: Pupils are equal, round, and reactive to light. Right eye exhibits no discharge. Left eye exhibits no discharge.  Neck: Normal range of motion. Neck supple.  Cardiovascular: Normal rate, regular rhythm and normal heart sounds.   Pulmonary/Chest: Effort normal. No respiratory distress. She has wheezes. She exhibits no tenderness.  Expiratory wheeze and course BS bilateral bases. No definite crackles  Lymphadenopathy:    She has no cervical adenopathy.  Neurological: She is alert and oriented to person, place, and time. A cranial nerve deficit is present.  Skin: Skin is warm and dry. Rash noted. She is not diaphoretic.  Papular rash to left lateral elbow    ED Course  Procedures (including critical care time) Labs Review Labs Reviewed - No data to display  Imaging Review No results found.   MDM   1. Respiratory infection   2. Asthma flare   3. Cough   4. Rash and nonspecific skin eruption    Given duration cover with abx. Treat symptomatically with Depo injection, Duo neb here. Refill MDI as well. Continue asthma preventative medications. Rash should improved with Depo. F/U if worsens.     Bjorn Pippin, PA-C 12/11/13 1152

## 2013-12-11 NOTE — Discharge Instructions (Signed)

## 2014-04-23 ENCOUNTER — Encounter (HOSPITAL_COMMUNITY): Payer: Self-pay | Admitting: Emergency Medicine

## 2014-04-23 ENCOUNTER — Emergency Department (HOSPITAL_COMMUNITY)
Admission: EM | Admit: 2014-04-23 | Discharge: 2014-04-23 | Disposition: A | Discharge status: Home / Self Care | Payer: Medicaid Other | Source: Home / Self Care | Attending: Family Medicine | Admitting: Family Medicine

## 2014-04-23 DIAGNOSIS — J45901 Unspecified asthma with (acute) exacerbation: Secondary | ICD-10-CM

## 2014-04-23 MED ORDER — IPRATROPIUM-ALBUTEROL 0.5-2.5 (3) MG/3ML IN SOLN
RESPIRATORY_TRACT | Status: AC
  Filled 2014-04-23: qty 3

## 2014-04-23 MED ORDER — SODIUM CHLORIDE 0.9 % IJ SOLN
INTRAMUSCULAR | Status: AC
  Filled 2014-04-23: qty 3

## 2014-04-23 MED ORDER — FLUTICASONE-SALMETEROL 250-50 MCG/DOSE IN AEPB: 2.0000 | INHALATION_SPRAY | Freq: Two times a day (BID) | RESPIRATORY_TRACT | Status: DC

## 2014-04-23 MED ORDER — TRAMADOL HCL 50 MG PO TABS: 50.0000 mg | ORAL_TABLET | Freq: Every evening | ORAL | Status: DC | PRN

## 2014-04-23 MED ORDER — PREDNISONE 50 MG PO TABS: 50.0000 mg | ORAL_TABLET | Freq: Every day | ORAL | Status: DC

## 2014-04-23 MED ORDER — IPRATROPIUM-ALBUTEROL 0.5-2.5 (3) MG/3ML IN SOLN
3.0000 mL | Freq: Once | RESPIRATORY_TRACT | Status: AC
  Administered 2014-04-23: 3 mL via RESPIRATORY_TRACT

## 2014-04-23 NOTE — Discharge Instructions (Signed)
Thank you for coming in today. Use albuterol as needed. Take prednisone daily for 5 days. Use tramadol for cough as needed. Restart Advair. Call or go to the emergency room if you get worse, have trouble breathing, have chest pains, or palpitations.    Asthma, Acute Bronchospasm Acute bronchospasm caused by asthma is also referred to as an asthma attack. Bronchospasm means your air passages become narrowed. The narrowing is caused by inflammation and tightening of the muscles in the air tubes (bronchi) in your lungs. This can make it hard to breathe or cause you to wheeze and cough. CAUSES Possible triggers are:  Animal dander from the skin, hair, or feathers of animals.  Dust mites contained in house dust.  Cockroaches.  Pollen from trees or grass.  Mold.  Cigarette or tobacco smoke.  Air pollutants such as dust, household cleaners, hair sprays, aerosol sprays, paint fumes, strong chemicals, or strong odors.  Cold air or weather changes. Cold air may trigger inflammation. Winds increase molds and pollens in the air.  Strong emotions such as crying or laughing hard.  Stress.  Certain medicines such as aspirin or beta-blockers.  Sulfites in foods and drinks, such as dried fruits and wine.  Infections or inflammatory conditions, such as a flu, cold, or inflammation of the nasal membranes (rhinitis).  Gastroesophageal reflux disease (GERD). GERD is a condition where stomach acid backs up into your esophagus.  Exercise or strenuous activity. SIGNS AND SYMPTOMS   Wheezing.  Excessive coughing, particularly at night.  Chest tightness.  Shortness of breath. DIAGNOSIS  Your health care provider will ask you about your medical history and perform a physical exam. A chest X-ray or blood testing may be performed to look for other causes of your symptoms or other conditions that may have triggered your asthma attack. TREATMENT  Treatment is aimed at reducing inflammation  and opening up the airways in your lungs. Most asthma attacks are treated with inhaled medicines. These include quick relief or rescue medicines (such as bronchodilators) and controller medicines (such as inhaled corticosteroids). These medicines are sometimes given through an inhaler or a nebulizer. Systemic steroid medicine taken by mouth or given through an IV tube also can be used to reduce the inflammation when an attack is moderate or severe. Antibiotic medicines are only used if a bacterial infection is present.  HOME CARE INSTRUCTIONS   Rest.  Drink plenty of liquids. This helps the mucus to remain thin and be easily coughed up. Only use caffeine in moderation and do not use alcohol until you have recovered from your illness.  Do not smoke. Avoid being exposed to secondhand smoke.  You play a critical role in keeping yourself in good health. Avoid exposure to things that cause you to wheeze or to have breathing problems.  Keep your medicines up-to-date and available. Carefully follow your health care provider's treatment plan.  Take your medicine exactly as prescribed.  When pollen or pollution is bad, keep windows closed and use an air conditioner or go to places with air conditioning.  Asthma requires careful medical care. See your health care provider for a follow-up as advised. If you are more than [redacted] weeks pregnant and you were prescribed any new medicines, let your obstetrician know about the visit and how you are doing. Follow up with your health care provider as directed.  After you have recovered from your asthma attack, make an appointment with your outpatient doctor to talk about ways to reduce the likelihood of  future attacks. If you do not have a doctor who manages your asthma, make an appointment with a primary care doctor to discuss your asthma. SEEK IMMEDIATE MEDICAL CARE IF:   You are getting worse.  You have trouble breathing. If severe, call your local emergency  services (911 in the [place]  You develop chest pain or discomfort.  You are vomiting.  You are not able to keep fluids down.  You are coughing up yellow, green, brown, or bloody sputum.  You have a fever and your symptoms suddenly get worse.  You have trouble swallowing. MAKE SURE YOU:   Understand these instructions.  Will watch your condition.  Will get help right away if you are not doing well or get worse. Document Released: 10/08/2006 Document Revised: 06/28/2013 Document Reviewed: 12/29/2012 [facility] Patient Information 2015 [place], [place]. This information is not intended to replace advice given to you by your health care provider. Make sure you discuss any questions you have with your health care provider.

## 2014-04-23 NOTE — ED Provider Notes (Signed)
[name] [name] is a 33 y.o. female who presents to Urgent Care today for cough. Patient is a one week history of cough associated with chest tightness and shortness of breath. She does 2 weeks of running nose and congestion. She additionally has a mild sore throat. Her symptoms are consistent with previous episodes of asthma exacerbation. She is using her albuterol which has not worked very well. She ran out of Advair sometime ago. No nausea vomiting or diarrhea.   Past Medical History  Diagnosis Date  . Asthma   . Allergy   . Depression   . Panic disorder   . Kidney stone    History  Substance Use Topics  . Smoking status: Never Smoker   . Smokeless tobacco: Not on file  . Alcohol Use: Yes     Comment: couple times per month   ROS as above Medications: No current facility-administered medications for this encounter.   Current Outpatient Prescriptions  Medication Sig Dispense Refill  . albuterol (PROVENTIL HFA;VENTOLIN HFA) 108 (90 BASE) MCG/ACT inhaler Inhale 1-2 puffs into the lungs every 6 (six) hours as needed for wheezing or shortness of breath.  1 Inhaler  0  . buPROPion (WELLBUTRIN SR) 150 MG 12 hr tablet Take 1 tablet (150 mg total) by mouth 2 (two) times daily. For the first week take just once a day  60 tablet  6  . Fluticasone-Salmeterol (ADVAIR) 250-50 MCG/DOSE AEPB Inhale 2 puffs into the lungs 2 (two) times daily.  60 each  1  . hydrOXYzine (ATARAX/VISTARIL) 10 MG tablet Take 1 tablet (10 mg total) by mouth 3 (three) times daily as needed for itching.  30 tablet  0  . Loratadine (CLARITIN PO) Take 10 mg by mouth daily.      . predniSONE (DELTASONE) 50 MG tablet Take 1 tablet (50 mg total) by mouth daily.  5 tablet  0  . traMADol (ULTRAM) 50 MG tablet Take 1 tablet (50 mg total) by mouth at bedtime as needed (cough).  10 tablet  0    Exam:  BP 117/75  Pulse 88  Temp(Src) 98.3 F (36.8 C) (Oral)  Resp 16  SpO2 97%  LMP 05/09/2013 Gen: Well NAD HEENT:  EOMI,  MMM posterior pharynx with cobblestoning. Normal tympanic membranes bilaterally. Clear nasal discharge. Lungs: Normal work of breathing. Prolonged expiratory phase. Heart: RRR no MRG Abd: NABS, Soft. Nondistended, Nontender Exts: Brisk capillary refill, warm and well perfused.   Patient was given a 2.5/0.5 mg DuoNeb nebulizer treatment and felt that her breathing improved.  No results found for this or any previous visit (from the past 24 hour(s)). No results found.  Assessment and Plan: 33 y.o. female with asthma exacerbation likely caused by viral URI. Treatment with prednisone albuterol tramadol for cough suppression and refill of Advair. Followup with PCP.  Discussed warning signs or symptoms. Please see discharge instructions. Patient expresses understanding.     Gregor Hams, MD 04/23/14 [phone]

## 2014-04-23 NOTE — ED Notes (Signed)
Patient c/o cold sx including cough, runny nose and chest congestion x 2 weeks. Patient reports she has used Mucinex DM, Nyquil and Advair Inhaler with no relief. Patient reports cough exacerbates her asthma. Patient is alert and oriented and in no acute distress. Patient reports she also needs a refill on her Advair inhaler.

## 2014-04-29 ENCOUNTER — Encounter (HOSPITAL_COMMUNITY): Payer: Self-pay | Admitting: Emergency Medicine

## 2014-04-29 ENCOUNTER — Emergency Department (INDEPENDENT_AMBULATORY_CARE_PROVIDER_SITE_OTHER)
Admission: EM | Admit: 2014-04-29 | Discharge: 2014-04-29 | Disposition: A | Discharge status: Home / Self Care | Payer: Medicaid Other | Source: Home / Self Care | Attending: Family Medicine | Admitting: Family Medicine

## 2014-04-29 DIAGNOSIS — J019 Acute sinusitis, unspecified: Secondary | ICD-10-CM

## 2014-04-29 MED ORDER — HYDROCOD POLST-CHLORPHEN POLST 10-8 MG/5ML PO LQCR: 5.0000 mL | Freq: Two times a day (BID) | ORAL | Status: DC | PRN

## 2014-04-29 MED ORDER — FLUTICASONE PROPIONATE 50 MCG/ACT NA SUSP: 2.0000 | Freq: Two times a day (BID) | NASAL | Status: DC

## 2014-04-29 MED ORDER — PREDNISONE 10 MG PO TABS: ORAL_TABLET | ORAL | Status: DC

## 2014-04-29 MED ORDER — DOXYCYCLINE HYCLATE 100 MG PO CAPS: 100.0000 mg | ORAL_CAPSULE | Freq: Two times a day (BID) | ORAL | Status: DC

## 2014-04-29 NOTE — Discharge Instructions (Signed)

## 2014-04-29 NOTE — ED Notes (Signed)
C/o persistent cold sx onset 3 weeks; seen here last week Given prednisone and tramadol; no relief Sx today include: productive cough,congestion, swelling of bilateral eyes, SOB, wheezing Denies f/v/n/d Alert, no signs of acute distress.

## 2014-04-29 NOTE — ED Provider Notes (Signed)
Medical screening examination/treatment/procedure(s) were performed by a resident physician or non-physician practitioner and as the supervising physician I was immediately available for consultation/collaboration.  Lynne Leader, MD    Gregor Hams, MD 04/29/14 [phone]

## 2014-04-29 NOTE — ED Provider Notes (Signed)
CSN: [id number]     Arrival date & time 04/29/14  0902 History   First MD Initiated Contact with Patient 04/29/14 [phone]     Chief Complaint  Patient presents with  . URI   (Consider location/radiation/quality/duration/timing/severity/associated sxs/prior Treatment) HPI        33 year old [name] presents for evaluation of being sick for the past 3 weeks with cough, congestion, chest tightness with coughing, sinus pressure, drainage from her eyes, purulent nasal drainage, popping in her ears. This started out as a mild cold that caused her asthma to flare up. She was seen here after that started and was prescribed prednisone which helped for a while but then her symptoms started to worsen again. She is worried that she may be developing pneumonia because she has had that before, most recently about 4 months ago. She denies fever, chills, NVD, shortness of breath. She is taking over-the-counter medications now without relief.  Past Medical History  Diagnosis Date  . Asthma   . Allergy   . Depression   . Panic disorder   . Kidney stone    Past Surgical History  Procedure Laterality Date  . Tonsillectomy and adenoidectomy    . Adiana    . Dnc    . Tubal ligation    . Dilation and curettage of uterus    . Laparoscopic assisted vaginal hysterectomy N/A 05/31/2013    Procedure: LAPAROSCOPIC ASSISTED VAGINAL HYSTERECTOMY;  Surgeon: Cheri Fowler, MD;  Location: [facility];  Service: Gynecology;  Laterality: N/A;  . Bilateral salpingectomy Bilateral 05/31/2013    Procedure: BILATERAL SALPINGECTOMY;  Surgeon: Cheri Fowler, MD;  Location: [facility];  Service: Gynecology;  Laterality: Bilateral;  . Partial hysterectomy    . Kidney stone surgery     Family History  Problem Relation Age of Onset  . Hypertension Mother   . Diabetes Mother   . Cancer Mother    History  Substance Use Topics  . Smoking status: Never Smoker   . Smokeless tobacco: Not on file  . Alcohol Use: Yes     Comment: couple  times per month   OB History   Grav Para Term Preterm Abortions TAB SAB Ect Mult Living                 Review of Systems  Constitutional: Positive for fatigue. Negative for fever and chills.  HENT: Positive for congestion, ear pain, postnasal drip, rhinorrhea, sinus pressure and sore throat. Negative for ear discharge and nosebleeds.   Respiratory: Positive for cough, chest tightness and wheezing. Negative for shortness of breath.   Cardiovascular: Negative for chest pain and leg swelling.  Gastrointestinal: Negative for nausea, vomiting, diarrhea and constipation.  Endocrine: Negative for polydipsia and polyuria.  Genitourinary: Negative for dysuria, urgency, frequency and flank pain.  Skin: Negative for rash.  Neurological: Positive for headaches. Negative for dizziness, weakness and light-headedness.  All other systems reviewed and are negative.   Allergies  Morphine and related and Nitrofurantoin monohyd macro  Home Medications   Prior to Admission medications   Medication Sig Start Date End Date Taking? Authorizing Provider  albuterol (PROVENTIL HFA;VENTOLIN HFA) 108 (90 BASE) MCG/ACT inhaler Inhale 1-2 puffs into the lungs every 6 (six) hours as needed for wheezing or shortness of breath. 12/11/13  Yes Bjorn Pippin, PA-C  Fluticasone-Salmeterol (ADVAIR) 250-50 MCG/DOSE AEPB Inhale 2 puffs into the lungs 2 (two) times daily. 04/23/14  Yes Gregor Hams, MD  predniSONE (DELTASONE) 50 MG tablet Take 1 tablet (  50 mg total) by mouth daily. 04/23/14  Yes Gregor Hams, MD  traMADol (ULTRAM) 50 MG tablet Take 1 tablet (50 mg total) by mouth at bedtime as needed (cough). 04/23/14  Yes Gregor Hams, MD  buPROPion (WELLBUTRIN SR) 150 MG 12 hr tablet Take 1 tablet (150 mg total) by mouth 2 (two) times daily. For the first week take just once a day 08/11/12   Darreld Mclean, MD  chlorpheniramine-HYDROcodone (TUSSIONEX PENNKINETIC ER) 10-8 MG/5ML LQCR Take 5 mLs by mouth every 12 (twelve)  hours as needed for cough. 04/29/14   Liam Graham, PA-C  doxycycline (VIBRAMYCIN) 100 MG capsule Take 1 capsule (100 mg total) by mouth 2 (two) times daily. 04/29/14   Freeman Caldron Filemon Breton, PA-C  fluticasone (FLONASE) 50 MCG/ACT nasal spray Place 2 sprays into both nostrils 2 (two) times daily. Decrease to 2 sprays/nostril daily after 5 days 04/29/14   Liam Graham, PA-C  hydrOXYzine (ATARAX/VISTARIL) 10 MG tablet Take 1 tablet (10 mg total) by mouth 3 (three) times daily as needed for itching. 06/01/13   Cheri Fowler, MD  Loratadine (CLARITIN PO) Take 10 mg by mouth daily.    Historical Provider, MD  predniSONE (DELTASONE) 10 MG tablet 4 tabs PO QD for 4 days; 3 tabs PO QD for 3 days; 2 tabs PO QD for 2 days; 1 tab PO QD for 1 day 04/29/14   Liam Graham, PA-C   BP 130/80  Pulse 78  Temp(Src) 98.6 F (37 C) (Oral)  Resp 18  SpO2 97%  LMP 05/09/2013 Physical Exam  Nursing note and vitals reviewed. Constitutional: She is oriented to person, place, and time. Vital signs are normal. She appears well-developed and well-nourished. No distress.  HENT:  Head: Normocephalic and atraumatic.  Right Ear: Tympanic membrane, external ear and ear canal normal.  Left Ear: Tympanic membrane, external ear and ear canal normal.  Nose: Mucosal edema and rhinorrhea present. Right sinus exhibits maxillary sinus tenderness and frontal sinus tenderness. Left sinus exhibits maxillary sinus tenderness and frontal sinus tenderness.  Mouth/Throat: Uvula is midline, oropharynx is clear and moist and mucous membranes are normal.  Eyes: Conjunctivae are normal. Right eye exhibits no discharge. Left eye exhibits no discharge.  Neck: Normal range of motion. Neck supple.  Cardiovascular: Normal rate, regular rhythm and normal heart sounds.   Pulmonary/Chest: Effort normal. No respiratory distress. She has wheezes (slight, scattered, diffuse).  Abdominal: Soft. There is no tenderness.  Lymphadenopathy:    She has  no cervical adenopathy.  Neurological: She is alert and oriented to person, place, and time. She has normal strength. Coordination normal.  Skin: Skin is warm and dry. No rash noted. She is not diaphoretic.  Psychiatric: She has a normal mood and affect. Judgment normal.    ED Course  Procedures (including critical care time) Labs Review Labs Reviewed - No data to display  Imaging Review No results found.   MDM   1. Acute sinusitis, recurrence not specified, unspecified location    Given duration of illness, treat with antibiotics as well as symptomatic management. Advil sinus as needed for symptoms. Followup when necessary   Meds ordered this encounter  Medications  . doxycycline (VIBRAMYCIN) 100 MG capsule    Sig: Take 1 capsule (100 mg total) by mouth 2 (two) times daily.    Dispense:  20 capsule    Refill:  0    Order Specific Question:  Supervising Provider    Answer:  Lynne Leader,  S [[id number]]  . predniSONE (DELTASONE) 10 MG tablet    Sig: 4 tabs PO QD for 4 days; 3 tabs PO QD for 3 days; 2 tabs PO QD for 2 days; 1 tab PO QD for 1 day    Dispense:  30 tablet    Refill:  0    Order Specific Question:  Supervising Provider    Answer:  Lynne Leader, [place]  . fluticasone (FLONASE) 50 MCG/ACT nasal spray    Sig: Place 2 sprays into both nostrils 2 (two) times daily. Decrease to 2 sprays/nostril daily after 5 days    Dispense:  16 g    Refill:  2    Order Specific Question:  Supervising Provider    Answer:  Lynne Leader, [place]  . chlorpheniramine-HYDROcodone (TUSSIONEX PENNKINETIC ER) 10-8 MG/5ML LQCR    Sig: Take 5 mLs by mouth every 12 (twelve) hours as needed for cough.    Dispense:  115 mL    Refill:  0    Order Specific Question:  Supervising Provider    Answer:  Lynne Leader, [place]       Liam Graham, PA-C 04/29/14 [phone]

## 2014-07-12 ENCOUNTER — Other Ambulatory Visit: Payer: Self-pay | Admitting: Orthopaedic Surgery

## 2014-07-12 DIAGNOSIS — M25561 Pain in right knee: Secondary | ICD-10-CM

## 2014-07-13 ENCOUNTER — Ambulatory Visit
Admission: RE | Admit: 2014-07-13 | Discharge: 2014-07-13 | Disposition: A | Discharge status: Home / Self Care | Payer: Medicaid Other | Source: Ambulatory Visit | Attending: Orthopaedic Surgery | Admitting: Orthopaedic Surgery

## 2014-07-13 DIAGNOSIS — M25561 Pain in right knee: Secondary | ICD-10-CM

## 2014-07-19 ENCOUNTER — Encounter (HOSPITAL_BASED_OUTPATIENT_CLINIC_OR_DEPARTMENT_OTHER): Payer: Self-pay | Admitting: *Deleted

## 2014-07-20 ENCOUNTER — Other Ambulatory Visit (HOSPITAL_BASED_OUTPATIENT_CLINIC_OR_DEPARTMENT_OTHER): Payer: Self-pay | Admitting: Orthopaedic Surgery

## 2014-07-26 ENCOUNTER — Encounter (HOSPITAL_BASED_OUTPATIENT_CLINIC_OR_DEPARTMENT_OTHER): Payer: Self-pay | Admitting: *Deleted

## 2014-07-26 ENCOUNTER — Encounter (HOSPITAL_BASED_OUTPATIENT_CLINIC_OR_DEPARTMENT_OTHER)
Admission: RE | Disposition: A | Discharge status: Home / Self Care | Payer: Self-pay | Source: Ambulatory Visit | Attending: Orthopaedic Surgery

## 2014-07-26 ENCOUNTER — Ambulatory Visit (HOSPITAL_BASED_OUTPATIENT_CLINIC_OR_DEPARTMENT_OTHER)
Admission: RE | Admit: 2014-07-26 | Discharge: 2014-07-26 | Disposition: A | Discharge status: Home / Self Care | Payer: Medicaid Other | Source: Ambulatory Visit | Attending: Orthopaedic Surgery | Admitting: Orthopaedic Surgery

## 2014-07-26 ENCOUNTER — Ambulatory Visit (HOSPITAL_BASED_OUTPATIENT_CLINIC_OR_DEPARTMENT_OTHER): Payer: Medicaid Other | Admitting: Anesthesiology

## 2014-07-26 DIAGNOSIS — Y999 Unspecified external cause status: Secondary | ICD-10-CM | POA: Insufficient documentation

## 2014-07-26 DIAGNOSIS — M659 Synovitis and tenosynovitis, unspecified: Secondary | ICD-10-CM | POA: Diagnosis not present

## 2014-07-26 DIAGNOSIS — Y939 Activity, unspecified: Secondary | ICD-10-CM | POA: Diagnosis not present

## 2014-07-26 DIAGNOSIS — S83241A Other tear of medial meniscus, current injury, right knee, initial encounter: Secondary | ICD-10-CM | POA: Insufficient documentation

## 2014-07-26 DIAGNOSIS — Z79899 Other long term (current) drug therapy: Secondary | ICD-10-CM | POA: Diagnosis not present

## 2014-07-26 DIAGNOSIS — Z87442 Personal history of urinary calculi: Secondary | ICD-10-CM | POA: Diagnosis not present

## 2014-07-26 DIAGNOSIS — M94261 Chondromalacia, right knee: Secondary | ICD-10-CM | POA: Diagnosis present

## 2014-07-26 DIAGNOSIS — Y929 Unspecified place or not applicable: Secondary | ICD-10-CM | POA: Diagnosis not present

## 2014-07-26 DIAGNOSIS — J45909 Unspecified asthma, uncomplicated: Secondary | ICD-10-CM | POA: Insufficient documentation

## 2014-07-26 DIAGNOSIS — X58XXXA Exposure to other specified factors, initial encounter: Secondary | ICD-10-CM | POA: Insufficient documentation

## 2014-07-26 DIAGNOSIS — F329 Major depressive disorder, single episode, unspecified: Secondary | ICD-10-CM | POA: Diagnosis not present

## 2014-07-26 DIAGNOSIS — M25561 Pain in right knee: Secondary | ICD-10-CM

## 2014-07-26 HISTORY — PX: KNEE ARTHROSCOPY WITH DRILLING/MICROFRACTURE: SHX6425

## 2014-07-26 HISTORY — PX: KNEE ARTHROSCOPY WITH MEDIAL MENISECTOMY: SHX5651

## 2014-07-26 LAB — POCT HEMOGLOBIN-HEMACUE: HEMOGLOBIN: 12.9 g/dL (ref 12.0–15.0)

## 2014-07-26 SURGERY — ARTHROSCOPY, KNEE, WITH ABRASION ARTHROPLASTY OR MICROFRACTURE
Anesthesia: General | Site: Knee | Laterality: Right

## 2014-07-26 MED ORDER — CEFAZOLIN SODIUM-DEXTROSE 2-3 GM-% IV SOLR
2.0000 g | INTRAVENOUS | Status: AC
Start: 2014-07-26 — End: 2014-07-26
  Administered 2014-07-26: 2 g via INTRAVENOUS

## 2014-07-26 MED ORDER — FENTANYL CITRATE 0.05 MG/ML IJ SOLN
INTRAMUSCULAR | Status: AC
  Filled 2014-07-26: qty 4

## 2014-07-26 MED ORDER — MIDAZOLAM HCL 5 MG/5ML IJ SOLN
INTRAMUSCULAR | Status: DC | PRN
  Administered 2014-07-26: 2 mg via INTRAVENOUS

## 2014-07-26 MED ORDER — OXYCODONE HCL 5 MG PO TABS
ORAL_TABLET | ORAL | Status: AC
  Filled 2014-07-26: qty 1

## 2014-07-26 MED ORDER — FENTANYL CITRATE 0.05 MG/ML IJ SOLN: 50.0000 ug | INTRAMUSCULAR | Status: DC | PRN

## 2014-07-26 MED ORDER — SCOPOLAMINE 1 MG/3DAYS TD PT72
MEDICATED_PATCH | TRANSDERMAL | Status: AC
  Filled 2014-07-26: qty 1

## 2014-07-26 MED ORDER — ONDANSETRON HCL 4 MG/2ML IJ SOLN: 4.0000 mg | Freq: Once | INTRAMUSCULAR | Status: DC | PRN

## 2014-07-26 MED ORDER — MIDAZOLAM HCL 2 MG/2ML IJ SOLN: 1.0000 mg | INTRAMUSCULAR | Status: DC | PRN

## 2014-07-26 MED ORDER — OXYCODONE HCL 5 MG PO TABS
5.0000 mg | ORAL_TABLET | Freq: Once | ORAL | Status: AC | PRN
  Administered 2014-07-26: 5 mg via ORAL

## 2014-07-26 MED ORDER — KETOROLAC TROMETHAMINE 30 MG/ML IJ SOLN
INTRAMUSCULAR | Status: DC | PRN
  Administered 2014-07-26: 30 mg via INTRAVENOUS

## 2014-07-26 MED ORDER — PROPOFOL 10 MG/ML IV BOLUS
INTRAVENOUS | Status: DC | PRN
  Administered 2014-07-26: 200 mg via INTRAVENOUS

## 2014-07-26 MED ORDER — DEXAMETHASONE SODIUM PHOSPHATE 10 MG/ML IJ SOLN
INTRAMUSCULAR | Status: DC | PRN
  Administered 2014-07-26: 10 mg via INTRAVENOUS

## 2014-07-26 MED ORDER — BUPIVACAINE HCL (PF) 0.25 % IJ SOLN
INTRAMUSCULAR | Status: AC
  Filled 2014-07-26: qty 30

## 2014-07-26 MED ORDER — PROPOFOL 10 MG/ML IV BOLUS
INTRAVENOUS | Status: AC
  Filled 2014-07-26: qty 20

## 2014-07-26 MED ORDER — MIDAZOLAM HCL 2 MG/2ML IJ SOLN
INTRAMUSCULAR | Status: AC
  Filled 2014-07-26: qty 2

## 2014-07-26 MED ORDER — CEFAZOLIN SODIUM-DEXTROSE 2-3 GM-% IV SOLR
INTRAVENOUS | Status: AC
  Filled 2014-07-26: qty 50

## 2014-07-26 MED ORDER — ONDANSETRON HCL 4 MG/2ML IJ SOLN
INTRAMUSCULAR | Status: DC | PRN
  Administered 2014-07-26: 4 mg via INTRAVENOUS

## 2014-07-26 MED ORDER — SODIUM CHLORIDE 0.9 % IR SOLN
Status: DC | PRN
Start: 2014-07-26 — End: 2014-07-26
  Administered 2014-07-26: 5000 mL

## 2014-07-26 MED ORDER — HYDROMORPHONE HCL 1 MG/ML IJ SOLN
0.2500 mg | INTRAMUSCULAR | Status: DC | PRN
  Administered 2014-07-26 (×2): 0.5 mg via INTRAVENOUS

## 2014-07-26 MED ORDER — BUPIVACAINE HCL (PF) 0.25 % IJ SOLN
INTRAMUSCULAR | Status: DC | PRN
  Administered 2014-07-26: 8 mL via INTRA_ARTICULAR

## 2014-07-26 MED ORDER — OXYCODONE HCL 5 MG/5ML PO SOLN: 5.0000 mg | Freq: Once | ORAL | Status: AC | PRN

## 2014-07-26 MED ORDER — LACTATED RINGERS IV SOLN
INTRAVENOUS | Status: DC
  Administered 2014-07-26 (×3): via INTRAVENOUS

## 2014-07-26 MED ORDER — SCOPOLAMINE 1 MG/3DAYS TD PT72
1.0000 | MEDICATED_PATCH | TRANSDERMAL | Status: DC
  Administered 2014-07-26 (×2): 1.5 mg via TRANSDERMAL

## 2014-07-26 MED ORDER — HYDROMORPHONE HCL 1 MG/ML IJ SOLN
INTRAMUSCULAR | Status: AC
  Filled 2014-07-26: qty 1

## 2014-07-26 MED ORDER — OXYCODONE HCL 5 MG PO TABS: 5.0000 mg | ORAL_TABLET | ORAL | Status: DC | PRN

## 2014-07-26 MED ORDER — LIDOCAINE HCL (CARDIAC) 20 MG/ML IV SOLN
INTRAVENOUS | Status: DC | PRN
Start: 2014-07-26 — End: 2014-07-26
  Administered 2014-07-26: 80 mg via INTRAVENOUS

## 2014-07-26 MED ORDER — FENTANYL CITRATE 0.05 MG/ML IJ SOLN
INTRAMUSCULAR | Status: DC | PRN
  Administered 2014-07-26: 100 ug via INTRAVENOUS
  Administered 2014-07-26 (×2): 50 ug via INTRAVENOUS

## 2014-07-26 SURGICAL SUPPLY — 46 items
BANDAGE ELASTIC 6 VELCRO ST LF (GAUZE/BANDAGES/DRESSINGS) ×3 IMPLANT
BANDAGE ESMARK 6X9 LF (GAUZE/BANDAGES/DRESSINGS) ×1 IMPLANT
BLADE 4.2CUDA (BLADE) ×3 IMPLANT
BLADE CUDA GRT WHITE 3.5 (BLADE) IMPLANT
BLADE CUDA SHAVER 3.5 (BLADE) IMPLANT
BLADE CUTTER GATOR 3.5 (BLADE) IMPLANT
BNDG ESMARK 6X9 LF (GAUZE/BANDAGES/DRESSINGS) ×3
CANISTER SUCT 3000ML (MISCELLANEOUS) IMPLANT
CUFF TOURNIQUET SINGLE 24IN (TOURNIQUET CUFF) ×3 IMPLANT
CUFF TOURNIQUET SINGLE 34IN LL (TOURNIQUET CUFF) ×3 IMPLANT
DRAPE ARTHROSCOPY W/POUCH 90 (DRAPES) ×3 IMPLANT
DRAPE U-SHAPE 47X51 STRL (DRAPES) ×3 IMPLANT
DURAPREP 26ML APPLICATOR (WOUND CARE) ×3 IMPLANT
ELECT MENISCUS 165MM 90D (ELECTRODE) IMPLANT
ELECT REM PT RETURN 9FT ADLT (ELECTROSURGICAL)
ELECTRODE REM PT RTRN 9FT ADLT (ELECTROSURGICAL) IMPLANT
GAUZE SPONGE 4X4 12PLY STRL (GAUZE/BANDAGES/DRESSINGS) ×6 IMPLANT
GAUZE XEROFORM 1X8 LF (GAUZE/BANDAGES/DRESSINGS) ×3 IMPLANT
GLOVE BIOGEL PI IND STRL 7.0 (GLOVE) ×1 IMPLANT
GLOVE BIOGEL PI INDICATOR 7.0 (GLOVE) ×2
GLOVE ECLIPSE 6.5 STRL STRAW (GLOVE) ×3 IMPLANT
GLOVE EXAM NITRILE MD LF STRL (GLOVE) ×3 IMPLANT
GLOVE EXAM NITRILE PF MED BLUE (GLOVE) ×3 IMPLANT
GLOVE NEODERM STRL 7.5 LF PF (GLOVE) ×1 IMPLANT
GLOVE SURG NEODERM 7.5  LF PF (GLOVE) ×2
GLOVE SURG SYN 7.5  E (GLOVE) ×2
GLOVE SURG SYN 7.5 E (GLOVE) ×1 IMPLANT
GOWN STRL REIN XL XLG (GOWN DISPOSABLE) ×3 IMPLANT
GOWN STRL REUS W/ TWL LRG LVL3 (GOWN DISPOSABLE) ×1 IMPLANT
GOWN STRL REUS W/TWL LRG LVL3 (GOWN DISPOSABLE) ×2
KNEE WRAP E Z 3 GEL PACK (MISCELLANEOUS) ×3 IMPLANT
MANIFOLD NEPTUNE II (INSTRUMENTS) ×3 IMPLANT
PACK ARTHROSCOPY DSU (CUSTOM PROCEDURE TRAY) ×3 IMPLANT
PACK BASIN DAY SURGERY FS (CUSTOM PROCEDURE TRAY) ×3 IMPLANT
SET ARTHROSCOPY TUBING (MISCELLANEOUS) ×2
SET ARTHROSCOPY TUBING LN (MISCELLANEOUS) ×1 IMPLANT
SLEEVE SCD COMPRESS KNEE MED (MISCELLANEOUS) ×3 IMPLANT
SUT ETHILON 2 0 FS 18 (SUTURE) ×3 IMPLANT
SUT ETHILON 3 0 PS 1 (SUTURE) IMPLANT
SUT ETHILON 4 0 PS 2 18 (SUTURE) IMPLANT
SUT VIC AB 0 CT1 27 (SUTURE)
SUT VIC AB 0 CT1 27XBRD ANBCTR (SUTURE) IMPLANT
TOWEL OR 17X24 6PK STRL BLUE (TOWEL DISPOSABLE) ×3 IMPLANT
TOWEL OR NON WOVEN STRL DISP B (DISPOSABLE) ×3 IMPLANT
WATER STERILE IRR 1000ML POUR (IV SOLUTION) ×3 IMPLANT
YANKAUER SUCT BULB TIP NO VENT (SUCTIONS) IMPLANT

## 2014-07-26 NOTE — Transfer of Care (Signed)
Immediate Anesthesia Transfer of Care Note  Patient: [name] [name]  Procedure(s) Performed: Procedure(s): RIGHT KNEE ARTHROSCOPY WITH CHONDROPLASTY, MICROFRACTURE (Right) KNEE ARTHROSCOPY WITH MEDIAL MENISECTOMY (Right)  Patient Location: PACU  Anesthesia Type:General  Level of Consciousness: awake, alert  and oriented  Airway & Oxygen Therapy: Patient Spontanous Breathing and Patient connected to face mask oxygen  Post-op Assessment: Report given to PACU RN and Post -op Vital signs reviewed and stable  Post vital signs: Reviewed and stable  Complications: No apparent anesthesia complications

## 2014-07-26 NOTE — Anesthesia Postprocedure Evaluation (Signed)
  Anesthesia Post-op Note  Patient: [name] [name]  Procedure(s) Performed: Procedure(s): RIGHT KNEE ARTHROSCOPY WITH CHONDROPLASTY, MICROFRACTURE (Right) KNEE ARTHROSCOPY WITH MEDIAL MENISECTOMY (Right)  Patient Location: PACU  Anesthesia Type: General   Level of Consciousness: awake, alert  and oriented  Airway and Oxygen Therapy: Patient Spontanous Breathing  Post-op Pain: moderate  Post-op Assessment: Post-op Vital signs reviewed  Post-op Vital Signs: Reviewed  Last Vitals:  Filed Vitals:   07/26/14 1115  BP: 121/76  Pulse: 85  Temp:   Resp: 17    Complications: No apparent anesthesia complications

## 2014-07-26 NOTE — Discharge Instructions (Signed)
1. Remove surgical dressings in 2 days and place band aids on the incision. 2. Increase activity slowly and as tolerated 3. Ice the knee as much as possible  Post Anesthesia Home Care Instructions  Activity: Get plenty of rest for the remainder of the day. A responsible adult should stay with you for 24 hours following the procedure.  For the next 24 hours, DO NOT: -Drive a car -Paediatric nurse -Drink alcoholic beverages -Take any medication unless instructed by your physician -Make any legal decisions or sign important papers.  Meals: Start with liquid foods such as gelatin or soup. Progress to regular foods as tolerated. Avoid greasy, spicy, heavy foods. If nausea and/or vomiting occur, drink only clear liquids until the nausea and/or vomiting subsides. Call your physician if vomiting continues.  Special Instructions/Symptoms: Your throat may feel dry or sore from the anesthesia or the breathing tube placed in your throat during surgery. If this causes discomfort, gargle with warm salt water. The discomfort should disappear within 24 hours.

## 2014-07-26 NOTE — Anesthesia Preprocedure Evaluation (Signed)
Anesthesia Evaluation  Patient identified by MRN, date of birth, ID band Patient awake    Reviewed: Allergy & Precautions, NPO status , Patient's Chart, lab work & pertinent test results  Airway Mallampati: I  TM Distance: >3 FB Neck ROM: Full    Dental  (+) Teeth Intact, Dental Advisory Given   Pulmonary  breath sounds clear to auscultation        Cardiovascular Rhythm:Regular Rate:Normal     Neuro/Psych    GI/Hepatic   Endo/Other    Renal/GU Renal diseaseStones     Musculoskeletal   Abdominal   Peds  Hematology   Anesthesia Other Findings   Reproductive/Obstetrics                             Anesthesia Physical Anesthesia Plan  ASA: II  Anesthesia Plan: General   Post-op Pain Management:    Induction: Intravenous  Airway Management Planned: LMA  Additional Equipment:   Intra-op Plan:   Post-operative Plan: Extubation in OR  Informed Consent: I have reviewed the patients History and Physical, chart, labs and discussed the procedure including the risks, benefits and alternatives for the proposed anesthesia with the patient or authorized representative who has indicated his/her understanding and acceptance.   Dental advisory given  Plan Discussed with: CRNA, Anesthesiologist and Surgeon  Anesthesia Plan Comments:         Anesthesia Quick Evaluation

## 2014-07-26 NOTE — H&P (Signed)
PREOPERATIVE H&P  Chief Complaint: Right knee chondromalacia  HPI: [name] [name] is a 34 y.o. female who presents for surgical treatment of Right knee chondromalacia.  She denies any changes in medical history.  Past Medical History  Diagnosis Date  . Asthma   . Allergy   . Depression   . Panic disorder   . Kidney stone    Past Surgical History  Procedure Laterality Date  . Tonsillectomy and adenoidectomy    . Adiana    . Dnc    . Tubal ligation    . Dilation and curettage of uterus    . Laparoscopic assisted vaginal hysterectomy N/A 05/31/2013    Procedure: LAPAROSCOPIC ASSISTED VAGINAL HYSTERECTOMY;  Surgeon: Cheri Fowler, MD;  Location: [facility];  Service: Gynecology;  Laterality: N/A;  . Bilateral salpingectomy Bilateral 05/31/2013    Procedure: BILATERAL SALPINGECTOMY;  Surgeon: Cheri Fowler, MD;  Location: [facility];  Service: Gynecology;  Laterality: Bilateral;  . Partial hysterectomy    . Kidney stone surgery    . Tonsillectomy    . Abdominal hysterectomy    . Knee arthroscopy Right    History   Social History  . Marital Status: Married    Spouse [name]: N/A    Number of Children: N/A  . Years of Education: N/A   Social History Main Topics  . Smoking status: Never Smoker   . Smokeless tobacco: None  . Alcohol Use: Yes     Comment: couple times per month  . Drug Use: No  . Sexual Activity: None   Other Topics Concern  . None   Social History Narrative   Family History  Problem Relation Age of Onset  . Hypertension Mother   . Diabetes Mother   . Cancer Mother    Allergies  Allergen Reactions  . Morphine And Related Nausea And Vomiting  . Nitrofurantoin Monohyd Macro Hives   Prior to Admission medications   Medication Sig Start Date End Date Taking? Authorizing Provider  albuterol (PROVENTIL HFA;VENTOLIN HFA) 108 (90 BASE) MCG/ACT inhaler Inhale 1-2 puffs into the lungs every 6 (six) hours as needed for wheezing or shortness of breath.  12/11/13  Yes Bjorn Pippin, PA-C  busPIRone (BUSPAR) 5 MG tablet Take 5 mg by mouth 3 (three) times daily.   Yes Historical Provider, MD  fluticasone (FLONASE) 50 MCG/ACT nasal spray Place 2 sprays into both nostrils 2 (two) times daily. Decrease to 2 sprays/nostril daily after 5 days 04/29/14  Yes Liam Graham, PA-C  Fluticasone-Salmeterol (ADVAIR) 250-50 MCG/DOSE AEPB Inhale 2 puffs into the lungs 2 (two) times daily. 04/23/14  Yes Gregor Hams, MD  oxyCODONE-acetaminophen (PERCOCET/ROXICET) 5-325 MG per tablet Take by mouth every 4 (four) hours as needed for severe pain.   Yes Historical Provider, MD  traMADol (ULTRAM) 50 MG tablet Take 1 tablet (50 mg total) by mouth at bedtime as needed (cough). 04/23/14  Yes Gregor Hams, MD     Positive ROS: All other systems have been reviewed and were otherwise negative with the exception of those mentioned in the HPI and as above.  Physical Exam: General: Alert, no acute distress Cardiovascular: No pedal edema Respiratory: No cyanosis, no use of accessory musculature GI: abdomen soft Skin: No lesions in the area of chief complaint Neurologic: Sensation intact distally Psychiatric: Patient is competent for consent with normal mood and affect Lymphatic: no lymphedema  MUSCULOSKELETAL: exam stable  Assessment: Right knee chondromalacia  Plan: Plan for Procedure(s): RIGHT KNEE ARTHROSCOPY WITH CHONDROPLASTY,  MICROFRACTURE  The risks benefits and alternatives were discussed with the patient including but not limited to the risks of nonoperative treatment, versus surgical intervention including infection, bleeding, nerve injury,  blood clots, cardiopulmonary complications, morbidity, mortality, among others, and they were willing to proceed.   Marianna Payment, MD   07/26/2014 7:30 AM

## 2014-07-26 NOTE — Anesthesia Procedure Notes (Signed)
Procedure Name: LMA Insertion Date/Time: 07/26/2014 9:48 AM Performed by: Maryella Shivers Pre-anesthesia Checklist: Patient identified, Emergency Drugs available, Suction available and Patient being monitored Patient Re-evaluated:Patient Re-evaluated prior to inductionOxygen Delivery Method: Circle System Utilized Preoxygenation: Pre-oxygenation with 100% oxygen Intubation Type: IV induction Ventilation: Mask ventilation without difficulty LMA: LMA inserted LMA Size: 4.0 Number of attempts: 1 Airway Equipment and Method: Bite block Placement Confirmation: positive ETCO2 Tube secured with: Tape Dental Injury: Teeth and Oropharynx as per pre-operative assessment

## 2014-07-26 NOTE — Op Note (Signed)
   Surgery Date: 07/26/2014  Surgeon(s): Clatie Kessen Eduard Roux, MD  ANESTHESIA:  general  FLUIDS: Per anesthesia record.   ESTIMATED BLOOD LOSS: minimal  PREOPERATIVE DIAGNOSES:  1. Right knee medial meniscus tear 2. Right knee synovitis 3. Right knee trochlear chondromalacia  POSTOPERATIVE DIAGNOSES:  1. Right knee medial meniscus tear 2. Right knee synovitis 3. 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 Right knee arthroscopy with extensive synovectomy 2. Right knee arthroscopy with arthroscopic partial medial meniscectomy 3. Right knee arthroscopy with arthroscopic chondroplasty medial femoral condyle and medial tibial plateau. 4. Right knee arthroscopy with microfracture of femoral trochlea  DESCRIPTION OF PROCEDURE: [name] is a 34 y.o.-year-old female with Right knee medial meniscus tear and trochlear and medial compartment chondromalacia. Plans are to proceed with partial medial meniscectomy, microfracture and diagnostic arthroscopy with debridement as indicated. Full discussion held regarding risks benefits alternatives and complications related surgical intervention. Conservative care options reviewed. All questions answered.  The patient was identified in the preoperative holding area and the operative extremity was marked. The patient was brought to the operating room and transferred to operating table in a supine position. Satisfactory general anesthesia was induced by Anesthesiology. The patient's Right knee was examined under anesthesia. Range of motion without limitation symmetric to contralateral side. Lachman exam, grade 1,  grade 1 pivot shift. No significant varus or valgus instability at 0 and 30 degrees. Negative posterior drawer, negative posterolateral drawer.   Standard anterolateral, anteromedial arthroscopy  portals were obtained. The anteromedial portal was obtained with a spinal needle for localization under direct visualization with subsequent diagnostic findings.   Anteromedial and anterolateral chambers: moderate synovitis. The synovitis was debrided with a 4.5 mm full radius shaver through both the anteromedial and lateral portals.   Suprapatellar pouch and gutters: moderate synovitis or debris. Patella chondral surface: Grade 0 Trochlear chondral surface: Grade 4 Patellofemoral tracking: good Medial meniscus: degenerative tearing of body and posterior horn.  Medial femoral condyle flexion bearing surface: Grade 3 Medial femoral condyle extension bearing surface: Grade 3 Medial tibial plateau: Grade 3 Anterior cruciate ligament:stable Posterior cruciate ligament:stable Lateral meniscus: stable.   Lateral femoral condyle flexion bearing surface: Grade 0 Lateral femoral condyle extension bearing surface: Grade 0 Lateral tibial plateau: Grade 0   After debridement of the chondromalacia of the femoral trochlea and the partial medial meniscectomy the decision was made to perform microfracture of the femoral trochlear. There was grade 4 changes with  Exposed bone measuring approximately 2 cm wide and 1 cm long. A  Microfracture pick was used to performed procedure and we placed 4 holes in the area of the defect. This exhibited good bleeding once the tourniquet was deflated.  After completion of synovectomy, diagnostic exam, and debridements as described, all compartments were checked and no residual debris remained. The portals were approximated with interrupted nylon suture. All excess fluid was expressed from the joint. The portals were approximated with interrupted nylon suture. Xeroform sterile gauze dressings were applied followed by Ace bandage and ice pack.   DISPOSITION: The patient was awakened from general anesthetic, extubated, taken to the recovery room in medically stable condition,  no apparent complications. The patient may be weightbearing as tolerated in Right Right lower extremity.  Range of motion of right knee as tolerated.  Azucena Cecil, MD [place] 10:46 AM

## 2014-07-27 ENCOUNTER — Encounter (HOSPITAL_BASED_OUTPATIENT_CLINIC_OR_DEPARTMENT_OTHER): Payer: Self-pay | Admitting: Orthopaedic Surgery

## 2014-08-10 ENCOUNTER — Other Ambulatory Visit: Payer: Self-pay | Admitting: Physician Assistant

## 2014-08-10 DIAGNOSIS — N644 Mastodynia: Secondary | ICD-10-CM

## 2014-09-20 ENCOUNTER — Other Ambulatory Visit: Payer: Self-pay | Admitting: Specialist

## 2014-09-20 DIAGNOSIS — N6001 Solitary cyst of right breast: Secondary | ICD-10-CM

## 2014-09-26 ENCOUNTER — Ambulatory Visit
Admission: RE | Admit: 2014-09-26 | Discharge: 2014-09-26 | Disposition: A | Discharge status: Home / Self Care | Payer: Medicaid Other | Source: Ambulatory Visit | Attending: Specialist | Admitting: Specialist

## 2014-09-26 DIAGNOSIS — N6001 Solitary cyst of right breast: Secondary | ICD-10-CM

## 2014-10-09 ENCOUNTER — Other Ambulatory Visit: Payer: Self-pay | Admitting: Specialist

## 2014-10-09 DIAGNOSIS — Z803 Family history of malignant neoplasm of breast: Secondary | ICD-10-CM

## 2014-10-25 ENCOUNTER — Other Ambulatory Visit: Payer: Medicaid Other

## 2015-04-29 ENCOUNTER — Encounter (HOSPITAL_COMMUNITY): Payer: Self-pay | Admitting: Emergency Medicine

## 2015-04-29 ENCOUNTER — Emergency Department (HOSPITAL_COMMUNITY)
Admission: EM | Admit: 2015-04-29 | Discharge: 2015-04-29 | Disposition: A | Discharge status: Home / Self Care | Payer: Medicaid Other | Source: Home / Self Care | Attending: Family Medicine | Admitting: Family Medicine

## 2015-04-29 DIAGNOSIS — J40 Bronchitis, not specified as acute or chronic: Secondary | ICD-10-CM

## 2015-04-29 MED ORDER — AZITHROMYCIN 250 MG PO TABS: 250.0000 mg | ORAL_TABLET | Freq: Every day | ORAL | Status: DC

## 2015-04-29 NOTE — ED Provider Notes (Signed)
CSN: [id number]     Arrival date & time 04/29/15  1434 History   First MD Initiated Contact with Patient 04/29/15 1626     Chief Complaint  Patient presents with  . Nasal Congestion  . Asthma   (Consider location/radiation/quality/duration/timing/severity/associated sxs/prior Treatment) Patient is a 34 y.o. female presenting with URI. The history is provided by the patient. No language interpreter was used.  URI Presenting symptoms: congestion, cough, ear pain, rhinorrhea and sore throat   Presenting symptoms: no fever   Severity:  Moderate Onset quality:  Gradual Duration:  2 weeks Timing:  Constant Progression:  Worsening Chronicity:  New Relieved by:  Rest (Breathing better when she lay down and take her bra off) Worsened by:  Certain positions Ineffective treatments: Nyquil, dayquil. Associated symptoms: headaches, sneezing and wheezing   Patient has been using her inhaler medicine more often in the last few weeks with no improvement with her cough as well.  Past Medical History  Diagnosis Date  . Asthma   . Allergy   . Depression   . Panic disorder   . Kidney stone    Past Surgical History  Procedure Laterality Date  . Tonsillectomy and adenoidectomy    . Adiana    . Dnc    . Tubal ligation    . Dilation and curettage of uterus    . Laparoscopic assisted vaginal hysterectomy N/A 05/31/2013    Procedure: LAPAROSCOPIC ASSISTED VAGINAL HYSTERECTOMY;  Surgeon: Cheri Fowler, MD;  Location: [facility];  Service: Gynecology;  Laterality: N/A;  . Bilateral salpingectomy Bilateral 05/31/2013    Procedure: BILATERAL SALPINGECTOMY;  Surgeon: Cheri Fowler, MD;  Location: [facility];  Service: Gynecology;  Laterality: Bilateral;  . Partial hysterectomy    . Kidney stone surgery    . Tonsillectomy    . Abdominal hysterectomy    . Knee arthroscopy Right   . Knee arthroscopy with drilling/microfracture Right 07/26/2014    Procedure: RIGHT KNEE ARTHROSCOPY WITH CHONDROPLASTY,  MICROFRACTURE;  Surgeon: Marianna Payment, MD;  Location: [place];  Service: Orthopedics;  Laterality: Right;  . Knee arthroscopy with medial menisectomy Right 07/26/2014    Procedure: KNEE ARTHROSCOPY WITH MEDIAL MENISECTOMY;  Surgeon: Marianna Payment, MD;  Location: [place];  Service: Orthopedics;  Laterality: Right;   Family History  Problem Relation Age of Onset  . Hypertension Mother   . Diabetes Mother   . Cancer Mother    Social History  Substance Use Topics  . Smoking status: Never Smoker   . Smokeless tobacco: None  . Alcohol Use: Yes     Comment: couple times per month   OB History    No data available     Review of Systems  Constitutional: Negative for fever.  HENT: Positive for congestion, ear pain, rhinorrhea, sneezing and sore throat.   Respiratory: Positive for cough and wheezing.   Cardiovascular: Negative.   Gastrointestinal: Negative.   Genitourinary: Negative.   Neurological: Positive for headaches.  All other systems reviewed and are negative.   Allergies  Morphine and related and Nitrofurantoin monohyd macro  Home Medications   Prior to Admission medications   Medication Sig Start Date End Date Taking? Authorizing Provider  albuterol (PROVENTIL HFA;VENTOLIN HFA) 108 (90 BASE) MCG/ACT inhaler Inhale 1-2 puffs into the lungs every 6 (six) hours as needed for wheezing or shortness of breath. 12/11/13   Bjorn Pippin, PA-C  busPIRone (BUSPAR) 5 MG tablet Take 5 mg by mouth 3 (three) times  daily.    Historical Provider, MD  fluticasone (FLONASE) 50 MCG/ACT nasal spray Place 2 sprays into both nostrils 2 (two) times daily. Decrease to 2 sprays/nostril daily after 5 days 04/29/14   Liam Graham, PA-C  Fluticasone-Salmeterol (ADVAIR) 250-50 MCG/DOSE AEPB Inhale 2 puffs into the lungs 2 (two) times daily. 04/23/14   Gregor Hams, MD  oxyCODONE (OXY IR/ROXICODONE) 5 MG immediate release tablet Take 1-3 tablets (5-15 mg  total) by mouth every 4 (four) hours as needed. 07/26/14   Leandrew Koyanagi, MD  oxyCODONE-acetaminophen (PERCOCET/ROXICET) 5-325 MG per tablet Take by mouth every 4 (four) hours as needed for severe pain.    Historical Provider, MD  traMADol (ULTRAM) 50 MG tablet Take 1 tablet (50 mg total) by mouth at bedtime as needed (cough). 04/23/14   Gregor Hams, MD   Meds Ordered and Administered this Visit  Medications - No data to display  BP 110/75 mmHg  Pulse 72  Temp(Src) 99 F (37.2 C) (Oral)  Resp 16  SpO2 100%  LMP 05/09/2013 No data found.   Physical Exam  Constitutional: She is oriented to person, place, and time. She appears well-developed. No distress.  HENT:  Head: Normocephalic and atraumatic.  Right Ear: Tympanic membrane, external ear and ear canal normal.  Left Ear: Tympanic membrane, external ear and ear canal normal.  Eyes: Conjunctivae are normal. Pupils are equal, round, and reactive to light. Right eye exhibits no discharge. Left eye exhibits no discharge.  Neck: Neck supple.  Cardiovascular: Normal rate, regular rhythm and normal heart sounds.   No murmur heard. Pulmonary/Chest: Effort normal and breath sounds normal. No respiratory distress. She has no wheezes. She has no rales.  Musculoskeletal: Normal range of motion.  Lymphadenopathy:    She has no cervical adenopathy.  Neurological: She is alert and oriented to person, place, and time.  Nursing note and vitals reviewed.   ED Course  Procedures (including critical care time)  Labs Review Labs Reviewed - No data to display  Imaging Review No results found.   Visual Acuity Review  Right Eye Distance:   Left Eye Distance:   Bilateral Distance:    Right Eye Near:   Left Eye Near:    Bilateral Near:         MDM  No diagnosis found.  Cough: Bronchitis  Her respiratory exam completely benign today with no wheezing and O2 sat is normal as well. This has been on going for about 3 wks. Patient  reassured this is likely due to viral illness. May continue OTC cough syrup. Patient was given Zpak script. She may pick it up if no improvement of her symptoms in few days.  Return precaution discussed.   Kinnie Feil, MD 04/29/15 1640

## 2015-04-29 NOTE — ED Notes (Signed)
The patient presented to the [facility] with a complaint of nasal congestion, chest congestion, headache that has been going on for 2 weeks. The patient stated that she has tried OTC meds with no relief and has started to use her rescue inhaler for asthma relief more frequently.

## 2015-04-29 NOTE — Discharge Instructions (Signed)

## 2015-05-22 ENCOUNTER — Emergency Department (HOSPITAL_COMMUNITY): Payer: Medicaid Other

## 2015-05-22 ENCOUNTER — Emergency Department (HOSPITAL_COMMUNITY)
Admission: EM | Admit: 2015-05-22 | Discharge: 2015-05-22 | Disposition: A | Discharge status: Home / Self Care | Payer: Medicaid Other | Attending: Emergency Medicine | Admitting: Emergency Medicine

## 2015-05-22 ENCOUNTER — Encounter (HOSPITAL_COMMUNITY): Payer: Self-pay | Admitting: Emergency Medicine

## 2015-05-22 DIAGNOSIS — F329 Major depressive disorder, single episode, unspecified: Secondary | ICD-10-CM | POA: Diagnosis not present

## 2015-05-22 DIAGNOSIS — Z7951 Long term (current) use of inhaled steroids: Secondary | ICD-10-CM | POA: Insufficient documentation

## 2015-05-22 DIAGNOSIS — Z87442 Personal history of urinary calculi: Secondary | ICD-10-CM | POA: Insufficient documentation

## 2015-05-22 DIAGNOSIS — J45909 Unspecified asthma, uncomplicated: Secondary | ICD-10-CM | POA: Diagnosis not present

## 2015-05-22 DIAGNOSIS — M545 Low back pain: Secondary | ICD-10-CM | POA: Insufficient documentation

## 2015-05-22 DIAGNOSIS — Z792 Long term (current) use of antibiotics: Secondary | ICD-10-CM | POA: Diagnosis not present

## 2015-05-22 DIAGNOSIS — F410 Panic disorder [episodic paroxysmal anxiety] without agoraphobia: Secondary | ICD-10-CM | POA: Diagnosis not present

## 2015-05-22 DIAGNOSIS — R000 Tachycardia, unspecified: Secondary | ICD-10-CM | POA: Insufficient documentation

## 2015-05-22 DIAGNOSIS — Z79899 Other long term (current) drug therapy: Secondary | ICD-10-CM | POA: Insufficient documentation

## 2015-05-22 DIAGNOSIS — K2970 Gastritis, unspecified, without bleeding: Secondary | ICD-10-CM | POA: Diagnosis not present

## 2015-05-22 DIAGNOSIS — R110 Nausea: Secondary | ICD-10-CM

## 2015-05-22 DIAGNOSIS — R1010 Upper abdominal pain, unspecified: Secondary | ICD-10-CM

## 2015-05-22 DIAGNOSIS — R1031 Right lower quadrant pain: Secondary | ICD-10-CM | POA: Diagnosis present

## 2015-05-22 DIAGNOSIS — R109 Unspecified abdominal pain: Secondary | ICD-10-CM

## 2015-05-22 LAB — CBC
HCT: 40 % (ref 36.0–46.0)
HEMOGLOBIN: 13.3 g/dL (ref 12.0–15.0)
MCH: 31.1 pg (ref 26.0–34.0)
MCHC: 33.3 g/dL (ref 30.0–36.0)
MCV: 93.5 fL (ref 78.0–100.0)
Platelets: 243 10*3/uL (ref 150–400)
RBC: 4.28 MIL/uL (ref 3.87–5.11)
RDW: 12.4 % (ref 11.5–15.5)
WBC: 11.6 10*3/uL — ABNORMAL HIGH (ref 4.0–10.5)

## 2015-05-22 LAB — URINALYSIS, ROUTINE W REFLEX MICROSCOPIC
BILIRUBIN URINE: NEGATIVE
Glucose, UA: NEGATIVE mg/dL
HGB URINE DIPSTICK: NEGATIVE
Ketones, ur: 15 mg/dL — AB
Leukocytes, UA: NEGATIVE
Nitrite: NEGATIVE
Protein, ur: NEGATIVE mg/dL
SPECIFIC GRAVITY, URINE: 1.028 (ref 1.005–1.030)
pH: 6 (ref 5.0–8.0)

## 2015-05-22 LAB — I-STAT TROPONIN, ED: Troponin i, poc: 0 ng/mL (ref 0.00–0.08)

## 2015-05-22 LAB — COMPREHENSIVE METABOLIC PANEL
ALK PHOS: 56 U/L (ref 38–126)
ALT: 22 U/L (ref 14–54)
ANION GAP: 11 (ref 5–15)
AST: 23 U/L (ref 15–41)
Albumin: 4.2 g/dL (ref 3.5–5.0)
BILIRUBIN TOTAL: 0.5 mg/dL (ref 0.3–1.2)
BUN: 17 mg/dL (ref 6–20)
CALCIUM: 9.7 mg/dL (ref 8.9–10.3)
CO2: 22 mmol/L (ref 22–32)
CREATININE: 0.78 mg/dL (ref 0.44–1.00)
Chloride: 105 mmol/L (ref 101–111)
Glucose, Bld: 127 mg/dL — ABNORMAL HIGH (ref 65–99)
Potassium: 4.4 mmol/L (ref 3.5–5.1)
Sodium: 138 mmol/L (ref 135–145)
Total Protein: 6.6 g/dL (ref 6.5–8.1)

## 2015-05-22 LAB — I-STAT BETA HCG BLOOD, ED (MC, WL, AP ONLY)

## 2015-05-22 LAB — LIPASE, BLOOD: Lipase: 26 U/L (ref 11–51)

## 2015-05-22 MED ORDER — ONDANSETRON HCL 8 MG PO TABS: 8.0000 mg | ORAL_TABLET | Freq: Three times a day (TID) | ORAL | Status: DC | PRN

## 2015-05-22 MED ORDER — GI COCKTAIL ~~LOC~~
30.0000 mL | Freq: Once | ORAL | Status: AC
  Administered 2015-05-22: 30 mL via ORAL
  Filled 2015-05-22: qty 30

## 2015-05-22 MED ORDER — HYDROMORPHONE HCL 1 MG/ML IJ SOLN
0.5000 mg | Freq: Once | INTRAMUSCULAR | Status: AC
  Administered 2015-05-22: 0.5 mg via INTRAVENOUS
  Filled 2015-05-22: qty 1

## 2015-05-22 MED ORDER — PANTOPRAZOLE SODIUM 40 MG IV SOLR
40.0000 mg | Freq: Once | INTRAVENOUS | Status: AC
  Administered 2015-05-22: 40 mg via INTRAVENOUS
  Filled 2015-05-22: qty 40

## 2015-05-22 MED ORDER — ONDANSETRON HCL 4 MG/2ML IJ SOLN
4.0000 mg | Freq: Once | INTRAMUSCULAR | Status: AC
  Administered 2015-05-22: 4 mg via INTRAVENOUS
  Filled 2015-05-22: qty 2

## 2015-05-22 MED ORDER — SODIUM CHLORIDE 0.9 % IV BOLUS (SEPSIS)
1000.0000 mL | Freq: Once | INTRAVENOUS | Status: AC
  Administered 2015-05-22: 1000 mL via INTRAVENOUS

## 2015-05-22 MED ORDER — OMEPRAZOLE 20 MG PO CPDR: 20.0000 mg | DELAYED_RELEASE_CAPSULE | Freq: Every day | ORAL | Status: DC

## 2015-05-22 NOTE — Discharge Instructions (Signed)
Your abdominal pain is likely from gastritis or an ulcer. You will need to take prilosec as directed, and avoid spicy/fatty/acidic foods. Avoid laying down flat within 30 minutes of eating. Avoid NSAIDs like ibuprofen on an empty stomach. Use zofran as needed for nausea. Use your home norco as needed for pain but don't drive or operate machinery while taking this medication. Follow up with the gastroenterologist in one week for ongoing evaluation of your abdominal pain. Return to the ER for changes or worsening symptoms.  Abdominal (belly) pain can be caused by many things. Your caregiver performed an examination and possibly ordered blood/urine tests and imaging (CT scan, x-rays, ultrasound). Many cases can be observed and treated at home after initial evaluation in the emergency department. Even though you are being discharged home, abdominal pain can be unpredictable. Therefore, you need a repeated exam if your pain does not resolve, returns, or worsens. Most patients with abdominal pain don't have to be admitted to the hospital or have surgery, but serious problems like appendicitis and gallbladder attacks can start out as nonspecific pain. Many abdominal conditions cannot be diagnosed in one visit, so follow-up evaluations are very important. SEEK IMMEDIATE MEDICAL ATTENTION IF YOU DEVELOP ANY OF THE FOLLOWING SYMPTOMS:  The pain does not go away or becomes severe.   A temperature above 101 develops.   Repeated vomiting occurs (multiple episodes).   The pain becomes localized to portions of the abdomen. The right side could possibly be appendicitis. In an adult, the left lower portion of the abdomen could be colitis or diverticulitis.   Blood is being passed in stools or vomit (bright red or black tarry stools).   Return also if you develop chest pain, difficulty breathing, dizziness or fainting, or become confused, poorly responsive, or inconsolable (young children).  The constipation stays  for more than 4 days.   There is belly (abdominal) or rectal pain.   You do not seem to be getting better.      Abdominal Pain, Adult Many things can cause belly (abdominal) pain. Most times, the belly pain is not dangerous. Many cases of belly pain can be watched and treated at home. HOME CARE   Do not take medicines that help you go poop (laxatives) unless told to by your doctor.  Only take medicine as told by your doctor.  Eat or drink as told by your doctor. Your doctor will tell you if you should be on a special diet. GET HELP IF:  You do not know what is causing your belly pain.  You have belly pain while you are sick to your stomach (nauseous) or have runny poop (diarrhea).  You have pain while you pee or poop.  Your belly pain wakes you up at night.  You have belly pain that gets worse or better when you eat.  You have belly pain that gets worse when you eat fatty foods.  You have a fever. GET HELP RIGHT AWAY IF:   The pain does not go away within 2 hours.  You keep throwing up (vomiting).  The pain changes and is only in the right or left part of the belly.  You have bloody or tarry looking poop. MAKE SURE YOU:   Understand these instructions.  Will watch your condition.  Will get help right away if you are not doing well or get worse.   This information is not intended to replace advice given to you by your health care provider. Make sure you  discuss any questions you have with your health care provider.   Document Released: 12/10/2007 Document Revised: 07/14/2014 Document Reviewed: 03/02/2013 Elsevier Interactive Patient Education 2016 [employer]  Gastritis, Adult Gastritis is soreness and puffiness (inflammation) of the lining of the stomach. If you do not get help, gastritis can cause bleeding and sores (ulcers) in the stomach. HOME CARE   Only take medicine as told by your doctor.  If you were given antibiotic medicines, take them as told.  Finish the medicines even if you start to feel better.  Drink enough fluids to keep your pee (urine) clear or pale yellow.  Avoid foods and drinks that make your problems worse. Foods you may want to avoid include:  Caffeine or alcohol.  Chocolate.  Mint.  Garlic and onions.  Spicy foods.  Citrus fruits, including oranges, lemons, or limes.  Food containing tomatoes, including sauce, chili, salsa, and pizza.  Fried and fatty foods.  Eat small meals throughout the day instead of large meals. GET HELP RIGHT AWAY IF:   You have black or dark red poop (stools).  You throw up (vomit) blood. It may look like coffee grounds.  You cannot keep fluids down.  Your belly (abdominal) pain gets worse.  You have a fever.  You do not feel better after 1 week.  You have any other questions or concerns. MAKE SURE YOU:   Understand these instructions.  Will watch your condition.  Will get help right away if you are not doing well or get worse.   This information is not intended to replace advice given to you by your health care provider. Make sure you discuss any questions you have with your health care provider.   Document Released: 12/10/2007 Document Revised: 09/15/2011 Document Reviewed: 08/06/2011 Elsevier Interactive Patient Education 2016 Else[employer] Inc.  Nausea, Adult Nausea means you feel sick to your stomach or need to throw up (vomit). It may be a sign of a more serious problem. If nausea gets worse, you may throw up. If you throw up a lot, you may lose too much body fluid (dehydration). HOME CARE   Get plenty of rest.  Ask your doctor how to replace body fluid losses (rehydrate).  Eat small amounts of food. Sip liquids more often.  Take all medicines as told by your doctor. GET HELP RIGHT AWAY IF:  You have a fever.  You pass out (faint).  You keep throwing up or have blood in your throw up.  You are very weak, have dry lips or a dry mouth, or you are  very thirsty (dehydrated).  You have dark or bloody poop (stool).  You have very bad chest or belly (abdominal) pain.  You do not get better after 2 days, or you get worse.  You have a headache. MAKE SURE YOU:  Understand these instructions.  Will watch your condition.  Will get help right away if you are not doing well or get worse.   This information is not intended to replace advice given to you by your health care provider. Make sure you discuss any questions you have with your health care provider.   Document Released: 06/12/2011 Document Revised: 09/15/2011 Document Reviewed: 06/12/2011 Elsevier Interactive Patient Education [employer].

## 2015-05-22 NOTE — ED Notes (Signed)
Patient transported to Ultrasound with transporter  

## 2015-05-22 NOTE — ED Provider Notes (Signed)
CSN: [id number]     Arrival date & time 05/22/15  1708 History   First MD Initiated Contact with Patient 05/22/15 1757     Chief Complaint  Patient presents with  . Abdominal Pain  . Back Pain     (Consider location/radiation/quality/duration/timing/severity/associated sxs/prior Treatment) HPI Comments: [name] [name] is a 34 y.o. female with a PMHx of asthma, depression, panic disorder, and nephrolithiasis with a PSHx of complete hysterectomy and prior kidney stone surgery, who presents to the ED with complaints of right lower back/flank pain with gradual onset 2 days ago. Patient reports her pain is 10/10 constant stabbing right flank/lower back pain radiating to the epigastrium and right upper quadrant, worse with sitting and eating, and unrelieved with Gas-X, laxatives at home, and Toradol given by her primary care doctor yesterday. Associated symptoms include heartburn and nausea. She states this is different than when she has had any stones in the past. Initially she thought it was because of constipation, and after the laxative she had some loose stool, but this has resolved. She initially had vomiting, but this also resolved. She denies any fevers, chills, chest pain, shortness breath, ongoing vomiting, ongoing diarrhea, constipation, obstipation, melena, hematochezia, dysuria, hematuria, vaginal bleeding or discharge, numbness, tingling, weakness, cauda equina symptoms, or incontinence of urine or stool. No recent injury or heavy lifting.  Patient is a 34 y.o. female presenting with abdominal pain, back pain, and flank pain. The history is provided by the patient. No language interpreter was used.  Abdominal Pain Pain quality: stabbing   Associated symptoms: nausea   Associated symptoms: no chest pain, no chills, no constipation, no diarrhea, no dysuria, no fever, no hematuria, no shortness of breath, no vaginal bleeding, no vaginal discharge and no vomiting (none ongoing)     Back Pain Location:  Lumbar spine Quality:  Stabbing Radiates to: epigastrum/RUQ. Pain severity:  Severe Pain is:  Same all the time Onset quality:  Gradual Duration:  2 days Timing:  Constant Progression:  Unchanged Chronicity:  New Context: not lifting heavy objects and not recent injury   Relieved by:  Nothing Worsened by:  Sitting (and eating) Ineffective treatments:  Bowel activity, OTC medications and NSAIDs Associated symptoms: abdominal pain   Associated symptoms: no chest pain, no dysuria, no fever, no numbness and no weakness   Flank Pain This is a new problem. The current episode started in the past 7 days. The problem occurs constantly. The problem has been unchanged. Associated symptoms include abdominal pain and nausea. Pertinent negatives include no arthralgias, chest pain, chills, fever, myalgias, numbness, urinary symptoms, vomiting (none ongoing) or weakness. Exacerbated by: sitting or eating. Treatments tried: toradol shot, laxatives, and gas X. The treatment provided no relief.    Past Medical History  Diagnosis Date  . Asthma   . Allergy   . Depression   . Panic disorder   . Kidney stone    Past Surgical History  Procedure Laterality Date  . Tonsillectomy and adenoidectomy    . Adiana    . Dnc    . Tubal ligation    . Dilation and curettage of uterus    . Laparoscopic assisted vaginal hysterectomy N/A 05/31/2013    Procedure: LAPAROSCOPIC ASSISTED VAGINAL HYSTERECTOMY;  Surgeon: Cheri Fowler, MD;  Location: [facility];  Service: Gynecology;  Laterality: N/A;  . Bilateral salpingectomy Bilateral 05/31/2013    Procedure: BILATERAL SALPINGECTOMY;  Surgeon: Cheri Fowler, MD;  Location: [facility];  Service: Gynecology;  Laterality: Bilateral;  . Partial  hysterectomy    . Kidney stone surgery    . Tonsillectomy    . Abdominal hysterectomy    . Knee arthroscopy Right   . Knee arthroscopy with drilling/microfracture Right 07/26/2014    Procedure: RIGHT KNEE  ARTHROSCOPY WITH CHONDROPLASTY, MICROFRACTURE;  Surgeon: Marianna Payment, MD;  Location: [facility];  Service: Orthopedics;  Laterality: Right;  . Knee arthroscopy with medial menisectomy Right 07/26/2014    Procedure: KNEE ARTHROSCOPY WITH MEDIAL MENISECTOMY;  Surgeon: Marianna Payment, MD;  Location: [facility];  Service: Orthopedics;  Laterality: Right;   Family History  Problem Relation Age of Onset  . Hypertension Mother   . Diabetes Mother   . Cancer Mother    Social History  Substance Use Topics  . Smoking status: Never Smoker   . Smokeless tobacco: None  . Alcohol Use: Yes     Comment: couple times per month   OB History    No data available     Review of Systems  Constitutional: Negative for fever and chills.  Respiratory: Negative for shortness of breath.   Cardiovascular: Negative for chest pain.  Gastrointestinal: Positive for nausea and abdominal pain. Negative for vomiting (none ongoing), diarrhea, constipation and blood in stool.  Genitourinary: Positive for flank pain. Negative for dysuria, hematuria, vaginal bleeding and vaginal discharge.  Musculoskeletal: Positive for back pain. Negative for myalgias and arthralgias.  Skin: Negative for color change.  Allergic/Immunologic: Negative for immunocompromised state.  Neurological: Negative for weakness and numbness.  Psychiatric/Behavioral: Negative for confusion.   10 Systems reviewed and are negative for acute change except as noted in the HPI.    Allergies  Morphine and related and Nitrofurantoin monohyd macro  Home Medications   Prior to Admission medications   Medication Sig Start Date End Date Taking? Authorizing Provider  albuterol (PROVENTIL HFA;VENTOLIN HFA) 108 (90 BASE) MCG/ACT inhaler Inhale 1-2 puffs into the lungs every 6 (six) hours as needed for wheezing or shortness of breath. 12/11/13   Bjorn Pippin, PA-C  azithromycin (ZITHROMAX) 250 MG tablet Take 1  tablet (250 mg total) by mouth daily. Take first 2 tablets together, then 1 every day until finished.  Start if cough does not get better in few more days 04/29/15   Kinnie Feil, MD  busPIRone (BUSPAR) 5 MG tablet Take 5 mg by mouth 3 (three) times daily.    Historical Provider, MD  fluticasone (FLONASE) 50 MCG/ACT nasal spray Place 2 sprays into both nostrils 2 (two) times daily. Decrease to 2 sprays/nostril daily after 5 days 04/29/14   Liam Graham, PA-C  Fluticasone-Salmeterol (ADVAIR) 250-50 MCG/DOSE AEPB Inhale 2 puffs into the lungs 2 (two) times daily. 04/23/14   Gregor Hams, MD  oxyCODONE (OXY IR/ROXICODONE) 5 MG immediate release tablet Take 1-3 tablets (5-15 mg total) by mouth every 4 (four) hours as needed. 07/26/14   Leandrew Koyanagi, MD  oxyCODONE-acetaminophen (PERCOCET/ROXICET) 5-325 MG per tablet Take by mouth every 4 (four) hours as needed for severe pain.    Historical Provider, MD  traMADol (ULTRAM) 50 MG tablet Take 1 tablet (50 mg total) by mouth at bedtime as needed (cough). 04/23/14   Gregor Hams, MD   BP 130/86 mmHg  Pulse 101  Temp(Src) 98.4 F (36.9 C) (Oral)  Resp 18  SpO2 98%  LMP 05/09/2013 Physical Exam  Constitutional: She is oriented to person, place, and time. Vital signs are normal. She appears well-developed and well-nourished.  Non-toxic  appearance. No distress.  Afebrile, nontoxic, appears uncomfortable but in NAD  HENT:  Head: Normocephalic and atraumatic.  Mouth/Throat: Oropharynx is clear and moist and mucous membranes are normal.  Eyes: Conjunctivae and EOM are normal. Right eye exhibits no discharge. Left eye exhibits no discharge.  Neck: Normal range of motion. Neck supple.  Cardiovascular: Normal rate, regular rhythm, normal heart sounds and intact distal pulses.  Exam reveals no gallop and no friction rub.   No murmur heard. Tachycardic initially which resolved during exam  Pulmonary/Chest: Effort normal and breath sounds normal. No  respiratory distress. She has no decreased breath sounds. She has no wheezes. She has no rhonchi. She has no rales.  Abdominal: Soft. Normal appearance and bowel sounds are normal. She exhibits no distension. There is tenderness in the right upper quadrant and epigastric area. There is CVA tenderness (R sided) and positive Murphy's sign. There is no rigidity, no rebound, no guarding and no tenderness at McBurney's point.    Soft, nondistended, +BS throughout, with RUQ/epigastric TTP, no r/g/r, +murphy's, neg mcburney's, +R sided CVA TTP   Musculoskeletal: Normal range of motion.       Lumbar back: She exhibits tenderness. She exhibits normal range of motion, no bony tenderness and no spasm.       Back:  Lumbar spine with FROM intact without spinous process TTP, no bony stepoffs or deformities, mild b/l paraspinous muscle TTP without muscle spasms. Strength 5/5 in all extremities, sensation grossly intact in all extremities, gait steady and nonantalgic. No overlying skin changes.   Neurological: She is alert and oriented to person, place, and time. She has normal strength. No sensory deficit.  Skin: Skin is warm, dry and intact. No rash noted.  Psychiatric: She has a normal mood and affect.  Nursing note and vitals reviewed.   ED Course  Procedures (including critical care time) Labs Review Labs Reviewed  COMPREHENSIVE METABOLIC PANEL - Abnormal; Notable for the following:    Glucose, Bld 127 (*)    All other components within normal limits  CBC - Abnormal; Notable for the following:    WBC 11.6 (*)    All other components within normal limits  URINALYSIS, ROUTINE W REFLEX MICROSCOPIC (NOT AT [facility]) - Abnormal; Notable for the following:    Ketones, ur 15 (*)    All other components within normal limits  LIPASE, BLOOD  I-STAT BETA HCG BLOOD, ED ([facility], [facility], [facility] ONLY)  I-STAT TROPOININ, ED    Imaging Review US Abdomen Complete  05/22/2015  CLINICAL DATA:  Acute onset of right flank and  right upper quadrant abdominal tenderness. Nausea and vomiting. Initial encounter. EXAM: ULTRASOUND ABDOMEN COMPLETE COMPARISON:  CT of the abdomen and pelvis performed 08/28/2004 FINDINGS: Gallbladder: Mildly contracted and grossly unremarkable in appearance. No gallbladder wall thickening or pericholecystic fluid is seen. No ultrasonographic Murphy's sign is elicited. Common bile duct: Diameter: 0.2 cm, within normal limits in caliber. Liver: No focal lesion identified. Within normal limits in parenchymal echogenicity. IVC: No abnormality visualized. Pancreas: Visualized portion unremarkable. Spleen: Size and appearance within normal limits. Right Kidney: Length: 10.6 cm. Echogenicity within normal limits. No mass or hydronephrosis visualized. Left Kidney: Length: 11.1 cm. Echogenicity within normal limits. There is apparent minimal chronic fullness of the renal calyces at the lower pole of the left kidney, thought to remain within normal limits. No mass or significant hydronephrosis visualized. Abdominal aorta: No aneurysm visualized. Other findings: None. IMPRESSION: Unremarkable abdominal ultrasound. Electronically Signed   By: Jacqulynn Cadet  Chang M.D.   On: 05/22/2015 20:06   I have personally reviewed and evaluated these images and lab results as part of my medical decision-making.   EKG Interpretation None      MDM   Final diagnoses:  Flank pain  Pain of upper abdomen  Gastritis  Nausea    34 y.o. female here with right flank/low back pain radiating to the epigastric along with nausea and heartburn 2 days. She was seen by her regular doctor who gave her shot of Toradol which did not help. On exam, extreme tenderness in the right upper quadrant and epigastrium, no lower abdominal tenderness, patient has had a hysterectomy and has no vaginal complaints, doubt need for a pelvic exam at this time. Will obtain lab work and abdominal ultrasound, give fluids, Dilaudid, Zofran, and Protonix, and then  reassess.  8:37 PM HCG and trop neg. CMP WNL. CBC with mildly elevated WBC but could be from hemoconcentration since H/H slightly up from prior. U/A with ketones but otherwise WNL. U/S unremarkable. Pain initially improved but now worsened after U/S. Nausea improved. I believe this is likely gastritis. Will give GI cocktail and reassess.  10:00 PM Somewhat improved after GI cocktail, tolerating PO well. Will start on PPI and give zofran. Discussed diet modifications for GERD/PUD/gastritis. PRN tums/maalox discussed. Home pain meds for pain, will not rx any narcotics since she has chronic narcotics already. Heat to her back discussed. Will have her f/up with GI in 1 wk. I explained the diagnosis and have given explicit precautions to return to the ER including for any other new or worsening symptoms. The patient understands and accepts the medical plan as it's been dictated and I have answered their questions. Discharge instructions concerning home care and prescriptions have been given. The patient is STABLE and is discharged to home in good condition.  BP 117/68 mmHg  Pulse 78  Temp(Src) 98.4 F (36.9 C) (Oral)  Resp 16  SpO2 100%  LMP 05/09/2013  Meds ordered this encounter  Medications  . sodium chloride 0.9 % bolus 1,000 mL    Sig:   . ondansetron (ZOFRAN) injection 4 mg    Sig:   . HYDROmorphone (DILAUDID) injection 0.5 mg    Sig:   . pantoprazole (PROTONIX) injection 40 mg    Sig:   . gi cocktail (Maalox,Lidocaine,Donnatal)    Sig:   . omeprazole (PRILOSEC) 20 MG capsule    Sig: Take 1 capsule (20 mg total) by mouth daily.    Dispense:  30 capsule    Refill:  0    Order Specific Question:  Supervising Provider    Answer:  MILLER, BRIAN [[id number]]  . ondansetron (ZOFRAN) 8 MG tablet    Sig: Take 1 tablet (8 mg total) by mouth every 8 (eight) hours as needed for nausea or vomiting.    Dispense:  10 tablet    Refill:  0    Order Specific Question:  Supervising Provider     Answer:  Noemi Chapel [[id number]]     [name] Lavalle Camprubi-Soms, PA-C 05/22/15 [id number]  Nat Christen, MD 05/25/15 [phone]

## 2015-05-22 NOTE — ED Notes (Signed)
Pt c/o back pain around to right flank pain and abd pain x 2 days

## 2015-05-23 ENCOUNTER — Encounter: Payer: Self-pay | Admitting: Gastroenterology

## 2015-05-23 ENCOUNTER — Telehealth: Payer: Self-pay | Admitting: Gastroenterology

## 2015-05-23 ENCOUNTER — Telehealth (HOSPITAL_BASED_OUTPATIENT_CLINIC_OR_DEPARTMENT_OTHER): Payer: Self-pay | Admitting: Emergency Medicine

## 2015-05-23 NOTE — Telephone Encounter (Signed)
Pt scheduled to see Amy Esterwood PA 05/28/15@2pm . Pt aware of appt.

## 2015-05-28 ENCOUNTER — Ambulatory Visit: Payer: Medicaid Other | Admitting: Physician Assistant

## 2015-05-28 ENCOUNTER — Telehealth: Payer: Self-pay | Admitting: Gastroenterology

## 2015-05-29 NOTE — Telephone Encounter (Signed)
Dr Ardis Hughs please review, I spoke with the pt and she is upset that she was not seen and insisted I ask you about a direct endo colon.  She has abd pain and says she knows what she needs

## 2015-05-30 NOTE — Telephone Encounter (Signed)
I don't think it is a good idea that she have procedures for abdominal pain without office visit first.  I have an opening today in the office or am willing to double book sometime next week in the office if she prefers.  If not, perhaps an appt with an extender.

## 2015-05-30 NOTE — Telephone Encounter (Signed)
Left message for pt to call back.  Pt cannot come in today. She is aware of Dr. Ardis Hughs recommendations and scheduled to see Dr. Ardis Hughs 06/05/15@2 :45pm. Pt aware of appt.

## 2015-06-05 ENCOUNTER — Ambulatory Visit (INDEPENDENT_AMBULATORY_CARE_PROVIDER_SITE_OTHER): Payer: Medicaid Other | Admitting: Gastroenterology

## 2015-06-05 ENCOUNTER — Encounter: Payer: Self-pay | Admitting: Gastroenterology

## 2015-06-05 VITALS — BP 105/70 | HR 82 | Ht 62.0 in | Wt 132.0 lb

## 2015-06-05 DIAGNOSIS — R1084 Generalized abdominal pain: Secondary | ICD-10-CM | POA: Diagnosis not present

## 2015-06-05 DIAGNOSIS — K5909 Other constipation: Secondary | ICD-10-CM

## 2015-06-05 MED ORDER — LINACLOTIDE 145 MCG PO CAPS: 145.0000 ug | ORAL_CAPSULE | Freq: Every day | ORAL | Status: DC

## 2015-06-05 MED ORDER — NA SULFATE-K SULFATE-MG SULF 17.5-3.13-1.6 GM/177ML PO SOLN: ORAL | Status: DC

## 2015-06-05 NOTE — Progress Notes (Signed)
HPI: This is a    very pleasant 34 year old woman   who was referred to me by [facility]  to evaluate  abdominal pain, constipation .    Chief complaint is abdominal pain, constipation   Abdominal US 05/2015; normal. Labs 05/2015 WBC 11.6 otherwise cbc was normal, CMET normal, lipase normal.  abd pains, back pains, epigastric pains.  The pains in her back.  Constipation for 14 years.  Will go once a week, lots of pushing and straining.  Sometimes not for 2-3 weeks.  Takes sennakot PRN, likes to take it every day.  Can go two weeks. Has rescued with enemas at times.  Tried fiber supplement daily for many days without improvement.  Takes miralax periodically.  Has noticed blood at times.  No colon cancer in the family.  Overall her weight has been stable.  Has very foul smelling gas.  Can drink sodas but water makes her nauseas.  Has epigastric pains intermittently.  Radiating to her back.  Hydrocodone 5mg  tid, for her knees and back.  Has been on these for 1-2 years.  Has been taking omeprazole for 2 weeks,    Review of systems: Pertinent positive and negative review of systems were noted in the above HPI section. Complete review of systems was performed and was otherwise normal.   Past Medical History  Diagnosis Date  . Asthma   . Allergy   . Depression   . Panic disorder   . Kidney stone     Past Surgical History  Procedure Laterality Date  . Tonsillectomy and adenoidectomy    . Adiana    . Dnc    . Tubal ligation    . Dilation and curettage of uterus    . Laparoscopic assisted vaginal hysterectomy N/A 05/31/2013    Procedure: LAPAROSCOPIC ASSISTED VAGINAL HYSTERECTOMY;  Surgeon: Cheri Fowler, MD;  Location: [facility];  Service: Gynecology;  Laterality: N/A;  . Bilateral salpingectomy Bilateral 05/31/2013    Procedure: BILATERAL SALPINGECTOMY;  Surgeon: Cheri Fowler, MD;  Location: [facility];  Service: Gynecology;  Laterality: Bilateral;  .  Partial hysterectomy    . Kidney stone surgery    . Tonsillectomy    . Abdominal hysterectomy    . Knee arthroscopy Right   . Knee arthroscopy with drilling/microfracture Right 07/26/2014    Procedure: RIGHT KNEE ARTHROSCOPY WITH CHONDROPLASTY, MICROFRACTURE;  Surgeon: Marianna Payment, MD;  Location: [facility];  Service: Orthopedics;  Laterality: Right;  . Knee arthroscopy with medial menisectomy Right 07/26/2014    Procedure: KNEE ARTHROSCOPY WITH MEDIAL MENISECTOMY;  Surgeon: Marianna Payment, MD;  Location: [facility];  Service: Orthopedics;  Laterality: Right;    Current Outpatient Prescriptions  Medication Sig Dispense Refill  . albuterol (PROVENTIL HFA;VENTOLIN HFA) 108 (90 BASE) MCG/ACT inhaler Inhale 1-2 puffs into the lungs every 6 (six) hours as needed for wheezing or shortness of breath. 1 Inhaler 0  . busPIRone (BUSPAR) 5 MG tablet Take 5 mg by mouth 3 (three) times daily.    . Fluticasone-Salmeterol (ADVAIR) 250-50 MCG/DOSE AEPB Inhale 2 puffs into the lungs 2 (two) times daily. 60 each 1  . HYDROcodone-acetaminophen (NORCO/VICODIN) 5-325 MG tablet Take 1 tablet by mouth every 6 (six) hours as needed for moderate pain.    Marland Kitchen ibuprofen (ADVIL,MOTRIN) 200 MG tablet Take 400 mg by mouth every 6 (six) hours as needed for fever.    Marland Kitchen omeprazole (PRILOSEC) 20 MG capsule Take 1 capsule (20 mg total) by  mouth daily. 30 capsule 0  . ondansetron (ZOFRAN) 8 MG tablet Take 1 tablet (8 mg total) by mouth every 8 (eight) hours as needed for nausea or vomiting. 10 tablet 0  . predniSONE (DELTASONE) 10 MG tablet Take 20 mg by mouth daily with breakfast.     No current facility-administered medications for this visit.    Allergies as of 06/05/2015 - Review Complete 06/05/2015  Allergen Reaction Noted  . Morphine and related Nausea And Vomiting 05/19/2013  . Nitrofurantoin monohyd macro Hives 07/18/2011    Family History  Problem Relation Age of Onset  .  Hypertension Mother   . Diabetes Mother   . Cancer Mother     Social History   Social History  . Marital Status: Divorced    Spouse Name: N/A  . Number of Children: N/A  . Years of Education: N/A   Occupational History  . Not on file.   Social History Main Topics  . Smoking status: Never Smoker   . Smokeless tobacco: Not on file  . Alcohol Use: Yes     Comment: couple times per month  . Drug Use: No  . Sexual Activity: Not on file   Other Topics Concern  . Not on file   Social History Narrative     Physical Exam: Ht 5\' 2"  (1.575 m)  Wt 132 lb (59.875 kg)  BMI 24.14 kg/m2  LMP 05/09/2013 Constitutional: generally well-appearing Psychiatric: alert and oriented x3 Eyes: extraocular movements intact Mouth: oral pharynx moist, no lesions Neck: supple no lymphadenopathy Cardiovascular: heart regular rate and rhythm Lungs: clear to auscultation bilaterally Abdomen: soft, nontender, nondistended, no obvious ascites, no peritoneal signs, normal bowel sounds Extremities: no lower extremity edema bilaterally Skin: no lesions on visible extremities   Assessment and plan: 34 y.o. female with  abdominal pains, chronic constipation, intermittent nausea  I think her significant constipation is her driving problem here. She has a BM once a week at most and only with the aid of laxatives. Chronic narcotic pain medicines certainly play a role but might not be the only factor. I recommended colonoscopy check for anatomic issues. She wondered if endometriosis is causing this and I explained to her that that would be pretty severe and an uncommon situation for endometriosis. I recommended she try to cut back on her pain medicines which she has already decided to do after visiting with her orthopedic doctor today. She is going to start taking MiraLAX on a daily basis instead of just once in a while. I have given her prescription for linzess   to be taken once daily. I also recommended she  change the way she is taking her proton pump inhibitor a bit.    Owens Loffler, MD [facility] 06/05/2015, 1:57 PM  Cc: [facility]

## 2015-06-05 NOTE — Patient Instructions (Signed)
You will be set up for a colonoscopy for chronic constipation (in 3-4 weeks) You should change the way you are taking your antiacid medicine (omeprazole) so that you are taking it 20-30 minutes prior to a decent meal as that is the way the pill is designed to work most effectively. Change the miralax, take one dose every single day. Start linzess, one pill once daily. Cutting back on pain meds will help your constipation.

## 2015-06-15 ENCOUNTER — Ambulatory Visit: Payer: Medicaid Other | Attending: Orthopaedic Surgery | Admitting: Physical Therapy

## 2015-06-15 DIAGNOSIS — M62830 Muscle spasm of back: Secondary | ICD-10-CM | POA: Insufficient documentation

## 2015-06-15 DIAGNOSIS — M545 Low back pain, unspecified: Secondary | ICD-10-CM

## 2015-06-15 DIAGNOSIS — R293 Abnormal posture: Secondary | ICD-10-CM | POA: Insufficient documentation

## 2015-06-15 NOTE — Patient Instructions (Signed)

## 2015-06-15 NOTE — Therapy (Signed)
[place], [place], [address] Phone: [phone]   Fax:  [phone]  Physical Therapy Evaluation  Patient Details  Name: [name] [name] MRN: [id number] Date of Birth: 09-Aug-1980 Referring Provider: Rodell Perna Md  Encounter Date: 06/15/2015      PT End of Session - 06/15/15 1219    Visit Number 1   Number of Visits 1   Date for PT Re-Evaluation 06/16/15   Authorization Type Medicaid   PT Start Time 0930   PT Stop Time [id number]   PT Time Calculation (min) 45 min   Activity Tolerance Patient tolerated treatment well   Behavior During Therapy [facility] for tasks assessed/performed      Past Medical History  Diagnosis Date  . Asthma   . Allergy   . Depression   . Panic disorder   . Kidney stone     Past Surgical History  Procedure Laterality Date  . Tonsillectomy and adenoidectomy    . Adiana    . Dnc    . Tubal ligation    . Dilation and curettage of uterus    . Laparoscopic assisted vaginal hysterectomy N/A 05/31/2013    Procedure: LAPAROSCOPIC ASSISTED VAGINAL HYSTERECTOMY;  Surgeon: Cheri Fowler, MD;  Location: [facility];  Service: Gynecology;  Laterality: N/A;  . Bilateral salpingectomy Bilateral 05/31/2013    Procedure: BILATERAL SALPINGECTOMY;  Surgeon: Cheri Fowler, MD;  Location: [facility];  Service: Gynecology;  Laterality: Bilateral;  . Partial hysterectomy    . Kidney stone surgery    . Tonsillectomy    . Abdominal hysterectomy    . Knee arthroscopy Right   . Knee arthroscopy with drilling/microfracture Right 07/26/2014    Procedure: RIGHT KNEE ARTHROSCOPY WITH CHONDROPLASTY, MICROFRACTURE;  Surgeon: Marianna Payment, MD;  Location: [facility];  Service: Orthopedics;  Laterality: Right;  . Knee arthroscopy with medial menisectomy Right 07/26/2014    Procedure: KNEE ARTHROSCOPY WITH MEDIAL MENISECTOMY;  Surgeon: Marianna Payment, MD;  Location: [facility];  Service: Orthopedics;  Laterality: Right;    There were no vitals filed for this visit.  Visit Diagnosis:  Bilateral low back pain without sciatica - Plan: PT plan of care cert/re-cert  Abnormal posture - Plan: PT plan of care cert/re-cert  Back muscle spasm - Plan: PT plan of care cert/re-cert      Subjective Assessment - 06/15/15 0934    Subjective pt is a 34 y.o F with CC low back pain that has been present for over 2 years which she reports from where she used to work. she report sthe pain getting worse over the last month. She reports intermittent N&T down to the toes of the RLE. she denies any hx of low back pain.    Limitations Lifting;Standing;Walking;House hold activities;Sitting   How long can you sit comfortably? 5-10 min   How long can you stand comfortably? 20 min   How long can you walk comfortably? 20 min   Diagnostic tests MRI per pt report lumbar L4 bulging    Patient Stated Goals to get the back stronger, decreaed the pain and spasm    Currently in Pain? Yes   Pain Score 4    Pain Location Back   Pain Orientation Mid;Lower   Pain Descriptors / Indicators Aching;Dull   Pain Type Chronic pain   Pain Onset More than a month ago   Pain Frequency Intermittent   Aggravating Factors  prolonged sitting, laying down propped up,  twisting.    Pain Relieving Factors heating pad, stretch            [facility] PT Assessment - 06/15/15 0942    Assessment   Medical Diagnosis low back pain   Referring Provider Rodell Perna Md   Onset Date/Surgical Date --  over 2 years   Hand Dominance Right   Next MD Visit January 2017   Prior Therapy yes bil knees   Precautions   Precautions None   Restrictions   Weight Bearing Restrictions No   Balance Screen   Has the patient fallen in the past 6 months No   Has the patient had a decrease in activity level because of a fear of falling?  No   Is the patient reluctant to leave their home because of a fear of falling?  No    Home Environment   Living Environment Private residence   Living Arrangements Children;Parent   Available Help at Discharge Available PRN/intermittently;Available 24 hours/day   Type of Home House   Home Access Stairs to enter   Entrance Stairs-Number of Steps 7   Entrance Stairs-Rails Can reach both   Home Layout One level   Prior Function   Level of Independence Independent;Independent with basic ADLs   Vocation Unemployed   Leisure sports, reading   Cognition   Overall Cognitive Status Within Functional Limits for tasks assessed   Posture/Postural Control   Posture/Postural Control Postural limitations   Postural Limitations Rounded Shoulders;Forward head;Anterior pelvic tilt;Increased lumbar lordosis   ROM / Strength   AROM / PROM / Strength AROM;Strength   AROM   AROM Assessment Site Lumbar   Lumbar Flexion 30   Lumbar Extension 8   Lumbar - Right Side Bend 30   Lumbar - Left Side Bend 20   Lumbar - Right Rotation 30%   Lumbar - Left Rotation 30%   Strength   Overall Strength Within functional limits for tasks performed   Palpation   Spinal mobility passive spinal intervertebral mobility limited at L1-L5 with increased tenderness located at L3/L4/L5   Palpation comment tendnerness located at bil parapsinals with R>L   Special Tests    Special Tests Lumbar   Lumbar Tests Slump Test;Prone Knee Bend Test;Straight Leg Raise   Slump test   Findings Negative   Prone Knee Bend Test   Findings Positive   Side --  bil for hip flexor tightness   Straight Leg Raise   Findings Negative                           PT Education - 06/15/15 1211    Education provided Yes   Education Details evaluation findings, HEP, lumbar spine anatomy and disc anatomy   Person(s) Educated Patient   Methods Explanation   Comprehension Verbalized understanding                    Plan - 06/15/15 1222    Clinical Impression Statement [name] [name] repors to OPPT with  CC of low back pain that has been present for the over 2 years with no traumatic injury. She demonstrates limited trunk mobility in all planes with tightness and pain noted during and at end ROM. LE strength WFL. she demonstrated increased lumbar lordosis with anterior pelvic. palpation revealed tendernes noted at the paraspinals.  Went over her HEP and answered all questions and provided handout for [facility] [place].    Pt will benefit from  skilled therapeutic intervention in order to improve on the following deficits Pain;Improper body mechanics;Postural dysfunction;Hypomobility;Decreased mobility;Decreased range of motion;Decreased endurance;Decreased activity tolerance   PT Frequency 1x / week  only 1 visit.    PT Next Visit Plan 1 visit only Medicaid   PT Home Exercise Plan see HEP handout   Consulted and Agree with Plan of Care Patient         Problem List Patient Active Problem List   Diagnosis Date Noted  . Menorrhagia 05/31/2013  . Dysmenorrhea 05/31/2013  . Dyspareunia 05/31/2013  . Knee pain 08/24/2012  . Asthma 08/05/2011   Starr Lake PT, DPT, LAT, ATC  06/15/2015  12:40 PM     [facility] [address] [place], [place], [address] Phone: [phone]   Fax:  [phone]  Name: [name] [name] MRN: [id number] Date of Birth: 1980/08/19

## 2015-07-10 ENCOUNTER — Ambulatory Visit (AMBULATORY_SURGERY_CENTER): Payer: Medicaid Other | Admitting: Gastroenterology

## 2015-07-10 ENCOUNTER — Encounter: Payer: Self-pay | Admitting: Gastroenterology

## 2015-07-10 VITALS — BP 99/51 | HR 70 | Temp 98.0°F | Resp 23 | Ht 62.0 in | Wt 132.0 lb

## 2015-07-10 DIAGNOSIS — K635 Polyp of colon: Secondary | ICD-10-CM

## 2015-07-10 DIAGNOSIS — D127 Benign neoplasm of rectosigmoid junction: Secondary | ICD-10-CM

## 2015-07-10 DIAGNOSIS — K5900 Constipation, unspecified: Secondary | ICD-10-CM | POA: Diagnosis not present

## 2015-07-10 MED ORDER — LUBIPROSTONE 8 MCG PO CAPS: 8.0000 ug | ORAL_CAPSULE | Freq: Every day | ORAL | Status: DC

## 2015-07-10 MED ORDER — OMEPRAZOLE 20 MG PO CPDR: 20.0000 mg | DELAYED_RELEASE_CAPSULE | Freq: Every day | ORAL | Status: DC

## 2015-07-10 MED ORDER — SODIUM CHLORIDE 0.9 % IV SOLN: 500.0000 mL | INTRAVENOUS | Status: DC

## 2015-07-10 NOTE — Progress Notes (Signed)
Called to room to assist during endoscopic procedure.  Patient ID and intended procedure confirmed with present staff. Received instructions for my participation in the procedure from the performing physician.  

## 2015-07-10 NOTE — Op Note (Signed)
[place]  [address] [place], [address]   COLONOSCOPY PROCEDURE REPORT  PATIENT: [name], [name]  MR#: [id number] BIRTHDATE: [birth year] , 34  yrs. old GENDER: female ENDOSCOPIST: Milus Banister, MD PROCEDURE DATE:  07/10/2015 PROCEDURE:   Colonoscopy, diagnostic and Colonoscopy with biopsy First Screening Colonoscopy - Avg.  risk and is 50 yrs.  old or older - No.  Prior Negative Screening - Now for repeat screening. N/A  History of Adenoma - Now for follow-up colonoscopy & has been > or = to 3 yrs.  N/A  Polyps removed today? Yes ASA CLASS:   Class II INDICATIONS:Chronic constipation. MEDICATIONS: Monitored anesthesia care and Propofol 200 mg IV  DESCRIPTION OF PROCEDURE:   After the risks benefits and alternatives of the procedure were thoroughly explained, informed consent was obtained.  The digital rectal exam revealed no abnormalities of the rectum.   The LB [id number]  endoscope was introduced through the anus and advanced to the cecum, which was identified by both the appendix and ileocecal valve. No adverse events experienced.   The quality of the prep was good.  The instrument was then slowly withdrawn as the colon was fully examined. Estimated blood loss is zero unless otherwise noted in this procedure report.    COLON FINDINGS: There were a few small hyperplastic appearing recto-sigmoid polyps (sessile, 3-56mm across).  These were sampled with biopsy and sent to pathology.  The examination was otherwise normal.  Retroflexed views revealed no abnormalities. The time to cecum = 3.4 Withdrawal time = 8.1   The scope was withdrawn and the procedure completed. COMPLICATIONS: There were no immediate complications.  ENDOSCOPIC IMPRESSION: There were a few small hyperplastic appearing recto-sigmoid polyps (sessile, 3-47mm across).  These were sampled with biopsy and sent to pathology.  The examination was otherwise  normal  RECOMMENDATIONS: Await pathology results Please stop linzess and try [place] (called in today) for your constipation. Call in 3-4 weeks to report on your response. My office will get in touch to schedule upper endoscopy for your nausea that did not improve with treatment of constipation.  eSigned:  Milus Banister, MD 07/10/2015 9:52 AM

## 2015-07-10 NOTE — Patient Instructions (Addendum)
YOU HAD AN ENDOSCOPIC PROCEDURE TODAY AT [facility]:   Refer to the procedure report that was given to you for any specific questions about what was found during the examination.  If the procedure report does not answer your questions, please call your gastroenterologist to clarify.  If you requested that your care partner not be given the details of your procedure findings, then the procedure report has been included in a sealed envelope for you to review at your convenience later.  YOU SHOULD EXPECT: Some feelings of bloating in the abdomen. Passage of more gas than usual.  Walking can help get rid of the air that was put into your GI tract during the procedure and reduce the bloating. If you had a lower endoscopy (such as a colonoscopy or flexible sigmoidoscopy) you may notice spotting of blood in your stool or on the toilet paper. If you underwent a bowel prep for your procedure, you may not have a normal bowel movement for a few days.  Please Note:  You might notice some irritation and congestion in your nose or some drainage.  This is from the oxygen used during your procedure.  There is no need for concern and it should clear up in a day or so.  SYMPTOMS TO REPORT IMMEDIATELY:   Following lower endoscopy (colonoscopy or flexible sigmoidoscopy):  Excessive amounts of blood in the stool  Significant tenderness or worsening of abdominal pains  Swelling of the abdomen that is new, acute  Fever of 100F or higher  For urgent or emergent issues, a gastroenterologist can be reached at any hour by calling [phone].   DIET: Your first meal following the procedure should be a small meal and then it is ok to progress to your normal diet. Heavy or fried foods are harder to digest and may make you feel nauseous or bloated.  Likewise, meals heavy in dairy and vegetables can increase bloating.  Drink plenty of fluids but you should avoid alcoholic beverages for 24  hours.  ACTIVITY:  You should plan to take it easy for the rest of today and you should NOT DRIVE or use heavy machinery until tomorrow (because of the sedation medicines used during the test).    FOLLOW UP: Our staff will call the number listed on your records the next business day following your procedure to check on you and address any questions or concerns that you may have regarding the information given to you following your procedure. If we do not reach you, we will leave a message.  However, if you are feeling well and you are not experiencing any problems, there is no need to return our call.  We will assume that you have returned to your regular daily activities without incident.  If any biopsies were taken you will be contacted by phone or by letter within the next 1-3 weeks.  Please call us at [phone] if you have not heard about the biopsies in 3 weeks.    SIGNATURES/CONFIDENTIALITY: You and/or your care partner have signed paperwork which will be entered into your electronic medical record.  These signatures attest to the fact that that the information above on your After Visit Summary has been reviewed and is understood.  Full responsibility of the confidentiality of this discharge information lies with you and/or your care-partner.  Polyp information given.  Reduce amitiza to once daily.   Call in 3-4 weeks to let Dr. Ardis Hughs know how you are doing.  Dr.Jacob's office to schedule upper endoscopy.

## 2015-07-10 NOTE — Progress Notes (Signed)
To recovery, report to Scott, RN, VSS 

## 2015-07-11 ENCOUNTER — Telehealth: Payer: Self-pay | Admitting: *Deleted

## 2015-07-11 NOTE — Telephone Encounter (Signed)
No answer, left message to call if questions or concerns. 

## 2015-07-17 ENCOUNTER — Encounter: Payer: Self-pay | Admitting: Gastroenterology

## 2015-07-17 ENCOUNTER — Telehealth: Payer: Self-pay

## 2015-07-17 NOTE — Telephone Encounter (Signed)
My office will get in touch to schedule upper endoscopy for your nausea that did not improve with treatment of constipation.

## 2015-07-18 NOTE — Telephone Encounter (Signed)
Left message on machine to call back  

## 2015-07-18 NOTE — Telephone Encounter (Signed)
Pt has been scheduled for previsit and EGD

## 2015-07-23 ENCOUNTER — Ambulatory Visit: Payer: Medicaid Other | Admitting: Gastroenterology

## 2015-08-31 ENCOUNTER — Ambulatory Visit (AMBULATORY_SURGERY_CENTER): Payer: Self-pay | Admitting: *Deleted

## 2015-08-31 VITALS — Ht 62.0 in | Wt 130.8 lb

## 2015-08-31 DIAGNOSIS — R110 Nausea: Secondary | ICD-10-CM

## 2015-08-31 NOTE — Progress Notes (Signed)
No egg or soy allergy known to patient  No issues with past sedation with any surgeries  or procedures, no intubation problems -had propofol with colon did fine  No diet pills per patient No home 02 use per patient  No blood thinners per patient

## 2015-09-07 ENCOUNTER — Encounter: Payer: Self-pay | Admitting: Gastroenterology

## 2015-09-07 ENCOUNTER — Ambulatory Visit (AMBULATORY_SURGERY_CENTER): Payer: Medicaid Other | Admitting: Gastroenterology

## 2015-09-07 VITALS — BP 115/75 | HR 71 | Temp 98.0°F | Resp 29 | Ht 62.0 in | Wt 130.0 lb

## 2015-09-07 DIAGNOSIS — B9681 Helicobacter pylori [H. pylori] as the cause of diseases classified elsewhere: Secondary | ICD-10-CM | POA: Diagnosis not present

## 2015-09-07 DIAGNOSIS — K2990 Gastroduodenitis, unspecified, without bleeding: Secondary | ICD-10-CM | POA: Diagnosis not present

## 2015-09-07 DIAGNOSIS — K2970 Gastritis, unspecified, without bleeding: Secondary | ICD-10-CM

## 2015-09-07 DIAGNOSIS — R110 Nausea: Secondary | ICD-10-CM

## 2015-09-07 DIAGNOSIS — R112 Nausea with vomiting, unspecified: Secondary | ICD-10-CM

## 2015-09-07 DIAGNOSIS — K2950 Unspecified chronic gastritis without bleeding: Secondary | ICD-10-CM | POA: Diagnosis not present

## 2015-09-07 MED ORDER — ONDANSETRON HCL 4 MG PO TABS: 4.0000 mg | ORAL_TABLET | Freq: Three times a day (TID) | ORAL | Status: DC | PRN

## 2015-09-07 MED ORDER — SODIUM CHLORIDE 0.9 % IV SOLN: 500.0000 mL | INTRAVENOUS | Status: DC

## 2015-09-07 MED ORDER — OMEPRAZOLE 40 MG PO CPDR: 40.0000 mg | DELAYED_RELEASE_CAPSULE | Freq: Every day | ORAL | Status: DC

## 2015-09-07 NOTE — Progress Notes (Signed)
Report to PACU, RN, vss, BBS= Clear.  

## 2015-09-07 NOTE — Progress Notes (Signed)
Called to room to assist during endoscopic procedure.  Patient ID and intended procedure confirmed with present staff. Received instructions for my participation in the procedure from the performing physician.  

## 2015-09-07 NOTE — Patient Instructions (Signed)
Discharge instructions given. Biopsies taken. Resume previous medications. YOU HAD AN ENDOSCOPIC PROCEDURE TODAY AT [facility]:   Refer to the procedure report that was given to you for any specific questions about what was found during the examination.  If the procedure report does not answer your questions, please call your gastroenterologist to clarify.  If you requested that your care partner not be given the details of your procedure findings, then the procedure report has been included in a sealed envelope for you to review at your convenience later.  YOU SHOULD EXPECT: Some feelings of bloating in the abdomen. Passage of more gas than usual.  Walking can help get rid of the air that was put into your GI tract during the procedure and reduce the bloating. If you had a lower endoscopy (such as a colonoscopy or flexible sigmoidoscopy) you may notice spotting of blood in your stool or on the toilet paper. If you underwent a bowel prep for your procedure, you may not have a normal bowel movement for a few days.  Please Note:  You might notice some irritation and congestion in your nose or some drainage.  This is from the oxygen used during your procedure.  There is no need for concern and it should clear up in a day or so.  SYMPTOMS TO REPORT IMMEDIATELY:   Following upper endoscopy (EGD)  Vomiting of blood or coffee ground material  New chest pain or pain under the shoulder blades  Painful or persistently difficult swallowing  New shortness of breath  Fever of 100F or higher  Black, tarry-looking stools  For urgent or emergent issues, a gastroenterologist can be reached at any hour by calling [phone].   DIET: Your first meal following the procedure should be a small meal and then it is ok to progress to your normal diet. Heavy or fried foods are harder to digest and may make you feel nauseous or bloated.  Likewise, meals heavy in dairy and vegetables can increase  bloating.  Drink plenty of fluids but you should avoid alcoholic beverages for 24 hours.  ACTIVITY:  You should plan to take it easy for the rest of today and you should NOT DRIVE or use heavy machinery until tomorrow (because of the sedation medicines used during the test).    FOLLOW UP: Our staff will call the number listed on your records the next business day following your procedure to check on you and address any questions or concerns that you may have regarding the information given to you following your procedure. If we do not reach you, we will leave a message.  However, if you are feeling well and you are not experiencing any problems, there is no need to return our call.  We will assume that you have returned to your regular daily activities without incident.  If any biopsies were taken you will be contacted by phone or by letter within the next 1-3 weeks.  Please call us at [phone] if you have not heard about the biopsies in 3 weeks.    SIGNATURES/CONFIDENTIALITY: You and/or your care partner have signed paperwork which will be entered into your electronic medical record.  These signatures attest to the fact that that the information above on your After Visit Summary has been reviewed and is understood.  Full responsibility of the confidentiality of this discharge information lies with you and/or your care-partner. 

## 2015-09-07 NOTE — Op Note (Signed)
[place]  [address] [place], [address]   ENDOSCOPY PROCEDURE REPORT  PATIENT: [name] [name], [name] [name]  MR#: [id number] BIRTHDATE: May 27, 1981 , 34  yrs. old GENDER: female ENDOSCOPIST: Milus Banister, MD PROCEDURE DATE:  09/07/2015 PROCEDURE:  EGD w/ biopsy ASA CLASS:     Class II INDICATIONS:  chronic nausea, GERD like symptoms. MEDICATIONS: Monitored anesthesia care and Propofol 140 mg IV TOPICAL ANESTHETIC: none  DESCRIPTION OF PROCEDURE: After the risks benefits and alternatives of the procedure were thoroughly explained, informed consent was obtained.  The LB [id number] endoscope was introduced through the mouth and advanced to the second portion of the duodenum , Without limitations.  The instrument was slowly withdrawn as the mucosa was fully examined.  There was mild to moderate non-specific pangastritis.  The stomach was biopsied (antrum and body) and sent to pathology.  The examination was otherwise normal.  Retroflexed views revealed no abnormalities.     The scope was then withdrawn from the patient and the procedure completed. COMPLICATIONS: There were no immediate complications.  ENDOSCOPIC IMPRESSION: There was mild to moderate non-specific pangastritis.  The stomach was biopsied (antrum and body) and sent to pathology.  The examination was otherwise normal  RECOMMENDATIONS: Await biopsy results For now, will call in increased strength antiacid medicine (omeprazole 40mg  once daily) and empiric anti-nausea medicine (zofran).  eSigned:  Milus Banister, MD 09/07/2015 9:21 AM

## 2015-09-10 ENCOUNTER — Telehealth: Payer: Self-pay | Admitting: *Deleted

## 2015-09-10 NOTE — Telephone Encounter (Signed)
  Follow up Call-  Call back number 09/07/2015 07/10/2015  Post procedure Call Back phone  # [phone]  Permission to leave phone message Yes Yes     Patient questions:  Do you have a fever, pain , or abdominal swelling? No. Pain Score  0 *  Have you tolerated food without any problems? Yes.    Have you been able to return to your normal activities? Yes.    Do you have any questions about your discharge instructions: Diet   No. Medications  No. Follow up visit  No.  Do you have questions or concerns about your Care? No.  Actions: * If pain score is 4 or above: No action needed, pain <4.

## 2015-09-17 ENCOUNTER — Other Ambulatory Visit: Payer: Self-pay | Admitting: Obstetrics and Gynecology

## 2015-09-17 DIAGNOSIS — N6001 Solitary cyst of right breast: Secondary | ICD-10-CM

## 2015-09-17 DIAGNOSIS — Z803 Family history of malignant neoplasm of breast: Secondary | ICD-10-CM

## 2015-09-18 ENCOUNTER — Other Ambulatory Visit: Payer: Self-pay

## 2015-09-18 MED ORDER — BIS SUBCIT-METRONID-TETRACYC 140-125-125 MG PO CAPS: 3.0000 | ORAL_CAPSULE | Freq: Three times a day (TID) | ORAL | Status: DC

## 2015-09-18 NOTE — Telephone Encounter (Signed)
Pt has been notified and allergies reviewed.

## 2015-09-19 ENCOUNTER — Telehealth: Payer: Self-pay | Admitting: Gastroenterology

## 2015-09-20 NOTE — Telephone Encounter (Signed)
Left message on machine to call back  

## 2015-09-20 NOTE — Telephone Encounter (Signed)
Pt wanted to confirm that her PPI is to be taken twice daily while on Pylera.  She was advised that it is twice daily and will call with any further concerns

## 2015-09-28 ENCOUNTER — Ambulatory Visit
Admission: RE | Admit: 2015-09-28 | Discharge: 2015-09-28 | Disposition: A | Discharge status: Home / Self Care | Payer: Medicaid Other | Source: Ambulatory Visit | Attending: Obstetrics and Gynecology | Admitting: Obstetrics and Gynecology

## 2015-09-28 DIAGNOSIS — Z803 Family history of malignant neoplasm of breast: Secondary | ICD-10-CM

## 2015-09-28 DIAGNOSIS — N6001 Solitary cyst of right breast: Secondary | ICD-10-CM

## 2015-11-07 ENCOUNTER — Encounter: Payer: Self-pay | Admitting: Genetic Counselor

## 2015-11-21 ENCOUNTER — Encounter (HOSPITAL_COMMUNITY): Payer: Self-pay | Admitting: Nurse Practitioner

## 2015-11-21 ENCOUNTER — Ambulatory Visit (HOSPITAL_COMMUNITY)
Admission: EM | Admit: 2015-11-21 | Discharge: 2015-11-21 | Disposition: A | Discharge status: Home / Self Care | Payer: Medicaid Other | Attending: Family Medicine | Admitting: Family Medicine

## 2015-11-21 DIAGNOSIS — J4530 Mild persistent asthma, uncomplicated: Secondary | ICD-10-CM

## 2015-11-21 DIAGNOSIS — J302 Other seasonal allergic rhinitis: Secondary | ICD-10-CM

## 2015-11-21 MED ORDER — PREDNISONE 20 MG PO TABS: ORAL_TABLET | ORAL | Status: DC

## 2015-11-21 MED ORDER — TRIAMCINOLONE ACETONIDE 40 MG/ML IJ SUSP
INTRAMUSCULAR | Status: AC
  Filled 2015-11-21: qty 1

## 2015-11-21 MED ORDER — IPRATROPIUM-ALBUTEROL 0.5-2.5 (3) MG/3ML IN SOLN
RESPIRATORY_TRACT | Status: AC
  Filled 2015-11-21: qty 3

## 2015-11-21 MED ORDER — IPRATROPIUM BROMIDE 0.06 % NA SOLN: 2.0000 | Freq: Four times a day (QID) | NASAL | Status: DC

## 2015-11-21 MED ORDER — ALBUTEROL SULFATE HFA 108 (90 BASE) MCG/ACT IN AERS
2.0000 | INHALATION_SPRAY | Freq: Once | RESPIRATORY_TRACT | Status: AC
  Administered 2015-11-21: 2 via RESPIRATORY_TRACT

## 2015-11-21 MED ORDER — TRIAMCINOLONE ACETONIDE 40 MG/ML IJ SUSP
40.0000 mg | Freq: Once | INTRAMUSCULAR | Status: AC
  Administered 2015-11-21: 40 mg via INTRAMUSCULAR

## 2015-11-21 MED ORDER — IPRATROPIUM-ALBUTEROL 0.5-2.5 (3) MG/3ML IN SOLN
3.0000 mL | Freq: Once | RESPIRATORY_TRACT | Status: AC
  Administered 2015-11-21: 3 mL via RESPIRATORY_TRACT

## 2015-11-21 MED ORDER — ALBUTEROL SULFATE HFA 108 (90 BASE) MCG/ACT IN AERS
INHALATION_SPRAY | RESPIRATORY_TRACT | Status: AC
  Filled 2015-11-21: qty 6.7

## 2015-11-21 NOTE — ED Notes (Signed)
Pt c/o 1 day history of cough, congestion, body aches, headaches. She reports a fever this morning. She tried over the counter cold medications and her asthma inhalers but continues to feel worse. She is alert and able to breathe easily.

## 2015-11-21 NOTE — Discharge Instructions (Signed)
Allergic Rhinitis For nasal and head congestion may take Sudafed PE 10 mg every 4 hours as needed. Saline nasal spray used frequently. For drainage may use Allegra, Claritin or Zyrtec. If you need stronger medicine to stop drainage may take Chlor-Trimeton 2-4 mg every 4 hours. This may cause drowsiness. Ibuprofen 600 mg every 6 hours as needed for pain, discomfort or fever. Drink plenty of fluids and stay well-hydrated. May also use Rhinocort Flonase nasal spray to help with nasal stuffiness and allergies. Allergic rhinitis is when the mucous membranes in the nose respond to allergens. Allergens are particles in the air that cause your body to have an allergic reaction. This causes you to release allergic antibodies. Through a chain of events, these eventually cause you to release histamine into the blood stream. Although meant to protect the body, it is this release of histamine that causes your discomfort, such as frequent sneezing, congestion, and an itchy, runny nose.  CAUSES Seasonal allergic rhinitis (hay fever) is caused by pollen allergens that may come from grasses, trees, and weeds. Year-round allergic rhinitis (perennial allergic rhinitis) is caused by allergens such as house dust mites, pet dander, and mold spores. SYMPTOMS  Nasal stuffiness (congestion).  Itchy, runny nose with sneezing and tearing of the eyes. DIAGNOSIS Your health care provider can help you determine the allergen or allergens that trigger your symptoms. If you and your health care provider are unable to determine the allergen, skin or blood testing may be used. Your health care provider will diagnose your condition after taking your health history and performing a physical exam. Your health care provider may assess you for other related conditions, such as asthma, pink eye, or an ear infection. TREATMENT Allergic rhinitis does not have a cure, but it can be controlled by:  Medicines that block allergy symptoms.  These may include allergy shots, nasal sprays, and oral antihistamines.  Avoiding the allergen. Hay fever may often be treated with antihistamines in pill or nasal spray forms. Antihistamines block the effects of histamine. There are over-the-counter medicines that may help with nasal congestion and swelling around the eyes. Check with your health care provider before taking or giving this medicine. If avoiding the allergen or the medicine prescribed do not work, there are many new medicines your health care provider can prescribe. Stronger medicine may be used if initial measures are ineffective. Desensitizing injections can be used if medicine and avoidance does not work. Desensitization is when a patient is given ongoing shots until the body becomes less sensitive to the allergen. Make sure you follow up with your health care provider if problems continue. HOME CARE INSTRUCTIONS It is not possible to completely avoid allergens, but you can reduce your symptoms by taking steps to limit your exposure to them. It helps to know exactly what you are allergic to so that you can avoid your specific triggers. SEEK MEDICAL CARE IF:  You have a fever.  You develop a cough that does not stop easily (persistent).  You have shortness of breath.  You start wheezing.  Symptoms interfere with normal daily activities.   This information is not intended to replace advice given to you by your health care provider. Make sure you discuss any questions you have with your health care provider.   Document Released: 03/18/2001 Document Revised: 07/14/2014 Document Reviewed: 02/28/2013 Elsevier Interactive Patient Education 2016 [place].  Asthma, Acute Bronchospasm Acute bronchospasm caused by asthma is also referred to as an asthma attack. Bronchospasm means your  air passages become narrowed. The narrowing is caused by inflammation and tightening of the muscles in the air tubes (bronchi) in your lungs. This  can make it hard to breathe or cause you to wheeze and cough. CAUSES Possible triggers are:  Animal dander from the skin, hair, or feathers of animals.  Dust mites contained in house dust.  Cockroaches.  Pollen from trees or grass.  Mold.  Cigarette or tobacco smoke.  Air pollutants such as dust, household cleaners, hair sprays, aerosol sprays, paint fumes, strong chemicals, or strong odors.  Cold air or weather changes. Cold air may trigger inflammation. Winds increase molds and pollens in the air.  Strong emotions such as crying or laughing hard.  Stress.  Certain medicines such as aspirin or beta-blockers.  Sulfites in foods and drinks, such as dried fruits and wine.  Infections or inflammatory conditions, such as a flu, cold, or inflammation of the nasal membranes (rhinitis).  Gastroesophageal reflux disease (GERD). GERD is a condition where stomach acid backs up into your esophagus.  Exercise or strenuous activity. SIGNS AND SYMPTOMS   Wheezing.  Excessive coughing, particularly at night.  Chest tightness.  Shortness of breath. DIAGNOSIS  Your health care provider will ask you about your medical history and perform a physical exam. A chest X-ray or blood testing may be performed to look for other causes of your symptoms or other conditions that may have triggered your asthma attack. TREATMENT  Treatment is aimed at reducing inflammation and opening up the airways in your lungs. Most asthma attacks are treated with inhaled medicines. These include quick relief or rescue medicines (such as bronchodilators) and controller medicines (such as inhaled corticosteroids). These medicines are sometimes given through an inhaler or a nebulizer. Systemic steroid medicine taken by mouth or given through an IV tube also can be used to reduce the inflammation when an attack is moderate or severe. Antibiotic medicines are only used if a bacterial infection is present.  HOME CARE  INSTRUCTIONS   Rest.  Drink plenty of liquids. This helps the mucus to remain thin and be easily coughed up. Only use caffeine in moderation and do not use alcohol until you have recovered from your illness.  Do not smoke. Avoid being exposed to secondhand smoke.  You play a critical role in keeping yourself in good health. Avoid exposure to things that cause you to wheeze or to have breathing problems.  Keep your medicines up-to-date and available. Carefully follow your health care provider's treatment plan.  Take your medicine exactly as prescribed.  When pollen or pollution is bad, keep windows closed and use an air conditioner or go to places with air conditioning.  Asthma requires careful medical care. See your health care provider for a follow-up as advised. If you are more than [redacted] weeks pregnant and you were prescribed any new medicines, let your obstetrician know about the visit and how you are doing. Follow up with your health care provider as directed.  After you have recovered from your asthma attack, make an appointment with your outpatient doctor to talk about ways to reduce the likelihood of future attacks. If you do not have a doctor who manages your asthma, make an appointment with a primary care doctor to discuss your asthma. SEEK IMMEDIATE MEDICAL CARE IF:   You are getting worse.  You have trouble breathing. If severe, call your local emergency services (911 in the [place]  You develop chest pain or discomfort.  You are vomiting.  You are not able to keep fluids down.  You are coughing up yellow, green, brown, or bloody sputum.  You have a fever and your symptoms suddenly get worse.  You have trouble swallowing. MAKE SURE YOU:   Understand these instructions.  Will watch your condition.  Will get help right away if you are not doing well or get worse.   This information is not intended to replace advice given to you by your health care provider. Make sure  you discuss any questions you have with your health care provider.   Document Released: 10/08/2006 Document Revised: 06/28/2013 Document Reviewed: 12/29/2012 Elsevier Interactive Patient Education [employer].

## 2015-11-21 NOTE — ED Provider Notes (Signed)
CSN: [id number]     Arrival date & time 11/21/15  1255 History   First MD Initiated Contact with Patient 11/21/15 1305     Chief Complaint  Patient presents with  . URI   (Consider location/radiation/quality/duration/timing/severity/associated sxs/prior Treatment) HPI Comments: 35 year old female with history of asthma is complaining of increased cough, tightness in the upper chest, chest pain associated with cough and taking a deep breath, upper respiratory congestion, nasal congestion and facial pain for 2 days. Denies fever or chills. She also complains of pain to the right side of her neck where there is tenderness over the SCM. Her current medications are albuterol HFA and Advair twice a day   Past Medical History  Diagnosis Date  . Asthma   . Allergy   . Depression   . Panic disorder   . Kidney stone   . GERD (gastroesophageal reflux disease)   . Constipation    Past Surgical History  Procedure Laterality Date  . Tonsillectomy and adenoidectomy    . Adiana    . Dnc    . Tubal ligation    . Dilation and curettage of uterus    . Laparoscopic assisted vaginal hysterectomy N/A 05/31/2013    Procedure: LAPAROSCOPIC ASSISTED VAGINAL HYSTERECTOMY;  Surgeon: Cheri Fowler, MD;  Location: [facility];  Service: Gynecology;  Laterality: N/A;  . Bilateral salpingectomy Bilateral 05/31/2013    Procedure: BILATERAL SALPINGECTOMY;  Surgeon: Cheri Fowler, MD;  Location: [facility];  Service: Gynecology;  Laterality: Bilateral;  . Partial hysterectomy    . Kidney stone surgery    . Tonsillectomy    . Abdominal hysterectomy    . Knee arthroscopy Right   . Knee arthroscopy with drilling/microfracture Right 07/26/2014    Procedure: RIGHT KNEE ARTHROSCOPY WITH CHONDROPLASTY, MICROFRACTURE;  Surgeon: Marianna Payment, MD;  Location: [facility];  Service: Orthopedics;  Laterality: Right;  . Knee arthroscopy with medial menisectomy Right 07/26/2014    Procedure: KNEE ARTHROSCOPY WITH  MEDIAL MENISECTOMY;  Surgeon: Marianna Payment, MD;  Location: [facility];  Service: Orthopedics;  Laterality: Right;  . Colonoscopy     Family History  Problem Relation Age of Onset  . Hypertension Mother   . Diabetes Mother   . Cancer Mother   . Breast cancer Mother   . Cancer Paternal Grandfather   . Prostate cancer Paternal Grandfather   . Colon cancer Neg Hx   . Colon polyps Neg Hx   . Rectal cancer Neg Hx   . Stomach cancer Neg Hx   . Esophageal cancer Neg Hx    Social History  Substance Use Topics  . Smoking status: Never Smoker   . Smokeless tobacco: Never Used  . Alcohol Use: 0.0 oz/week    0 Standard drinks or equivalent per week     Comment: couple times per month   OB History    No data available     Review of Systems  Constitutional: Positive for activity change. Negative for fever, chills, appetite change and fatigue.  HENT: Positive for congestion, postnasal drip, rhinorrhea, sinus pressure and sore throat. Negative for facial swelling.   Eyes: Negative.   Respiratory: Positive for cough and chest tightness.   Cardiovascular: Positive for chest pain. Negative for palpitations.  Gastrointestinal: Negative.   Musculoskeletal: Negative for neck pain and neck stiffness.  Skin: Negative for pallor and rash.  Neurological: Negative.   Psychiatric/Behavioral: Negative.     Allergies  Morphine and related and Nitrofurantoin monohyd macro  Home Medications   Prior to Admission medications   Medication Sig Start Date End Date Taking? Authorizing Provider  albuterol (PROVENTIL HFA;VENTOLIN HFA) 108 (90 BASE) MCG/ACT inhaler Inhale 1-2 puffs into the lungs every 6 (six) hours as needed for wheezing or shortness of breath. 12/11/13   Bjorn Pippin, PA-C  Fluticasone-Salmeterol (ADVAIR) 250-50 MCG/DOSE AEPB Inhale 2 puffs into the lungs 2 (two) times daily. 04/23/14   Gregor Hams, MD  ipratropium (ATROVENT) 0.06 % nasal spray Place 2 sprays into  both nostrils 4 (four) times daily. 11/21/15   Janne Napoleon, NP  predniSONE (DELTASONE) 20 MG tablet 3 Tabs PO Days 1-3, then 2 tabs PO Days 4-6, then 1 tab PO Day 7-9, then Half Tab PO Day 10-12 11/21/15   Janne Napoleon, NP   Meds Ordered and Administered this Visit   Medications  ipratropium-albuterol (DUONEB) 0.5-2.5 (3) MG/3ML nebulizer solution 3 mL (3 mLs Nebulization Given 11/21/15 1347)  albuterol (PROVENTIL HFA;VENTOLIN HFA) 108 (90 Base) MCG/ACT inhaler 2 puff (2 puffs Inhalation Given 11/21/15 1348)  triamcinolone acetonide (KENALOG-40) injection 40 mg (40 mg Intramuscular Given 11/21/15 1348)    BP 111/72 mmHg  Pulse 89  Temp(Src) 98.5 F (36.9 C) (Oral)  Resp 16  SpO2 98%  LMP 05/09/2013 No data found.   Physical Exam  Constitutional: She is oriented to person, place, and time. She appears well-developed and well-nourished. No distress.  HENT:  Mouth/Throat: No oropharyngeal exudate.  Bilateral TMs are clear with minor retraction.  Oropharynx with erythema, clear PND. No exudates. No swelling.  Red boggy turbinates  Eyes: Conjunctivae and EOM are normal.  Neck: Normal range of motion. Neck supple.  Cardiovascular: Normal rate, regular rhythm and normal heart sounds.   Pulmonary/Chest: Effort normal. No respiratory distress. She has wheezes. She has no rales.  Mildly prolonged expiratory phase. Collateral.use but mild wheezing. Forced cough produces coarseness and wheezing across the upper posterior chest. Forced deep inspiration produces call spasms.  Musculoskeletal: Normal range of motion. She exhibits no edema.  Lymphadenopathy:    She has no cervical adenopathy.  Neurological: She is alert and oriented to person, place, and time.  Skin: Skin is warm and dry. No rash noted.  Psychiatric: She has a normal mood and affect.  Nursing note and vitals reviewed.   ED Course  Procedures (including critical care time)  Labs Review Labs Reviewed - No data to  display  Imaging Review No results found.   Visual Acuity Review  Right Eye Distance:   Left Eye Distance:   Bilateral Distance:    Right Eye Near:   Left Eye Near:    Bilateral Near:         MDM   1. Other seasonal allergic rhinitis   2. Asthma, mild persistent, uncomplicated    Allergic Rhinitis For nasal and head congestion may take Sudafed PE 10 mg every 4 hours as needed. Saline nasal spray used frequently. For drainage may use Allegra, Claritin or Zyrtec. If you need stronger medicine to stop drainage may take Chlor-Trimeton 2-4 mg every 4 hours. This may cause drowsiness. Ibuprofen 600 mg every 6 hours as needed for pain, discomfort or fever. Drink plenty of fluids and stay well-hydrated. May also use Rhinocort Flonase nasal spray to help with nasal stuffiness and allergies. Meds ordered this encounter  Medications  . ipratropium-albuterol (DUONEB) 0.5-2.5 (3) MG/3ML nebulizer solution 3 mL    Sig:   . albuterol (PROVENTIL HFA;VENTOLIN HFA) 108 (90 Base) MCG/ACT inhaler  2 puff    Sig:   . triamcinolone acetonide (KENALOG-40) injection 40 mg    Sig:   . predniSONE (DELTASONE) 20 MG tablet    Sig: 3 Tabs PO Days 1-3, then 2 tabs PO Days 4-6, then 1 tab PO Day 7-9, then Half Tab PO Day 10-12    Dispense:  20 tablet    Refill:  0    Order Specific Question:  Supervising Provider    Answer:  Melony Overly [id number]  . ipratropium (ATROVENT) 0.06 % nasal spray    Sig: Place 2 sprays into both nostrils 4 (four) times daily.    Dispense:  15 mL    Refill:  12    Order Specific Question:  Supervising Provider    Answer:  Melony Overly [id number]   Post DuoNeb the patient states she feels a little better. Auscultation reveals the absence of expiratory wheeze, improved air movement. With cough there is minor and improved coarseness. Follow-up with her primary care doctor as needed.    Janne Napoleon, NP 11/21/15 1423

## 2015-12-06 ENCOUNTER — Encounter: Payer: Self-pay | Admitting: Genetic Counselor

## 2015-12-06 ENCOUNTER — Other Ambulatory Visit: Payer: Medicaid Other

## 2015-12-06 ENCOUNTER — Ambulatory Visit (HOSPITAL_BASED_OUTPATIENT_CLINIC_OR_DEPARTMENT_OTHER): Payer: Medicaid Other | Admitting: Genetic Counselor

## 2015-12-06 DIAGNOSIS — Z803 Family history of malignant neoplasm of breast: Secondary | ICD-10-CM | POA: Diagnosis not present

## 2015-12-06 DIAGNOSIS — Z8042 Family history of malignant neoplasm of prostate: Secondary | ICD-10-CM | POA: Diagnosis not present

## 2015-12-06 DIAGNOSIS — Z808 Family history of malignant neoplasm of other organs or systems: Secondary | ICD-10-CM | POA: Diagnosis not present

## 2015-12-06 DIAGNOSIS — Z800 Family history of malignant neoplasm of digestive organs: Secondary | ICD-10-CM

## 2015-12-06 DIAGNOSIS — Z809 Family history of malignant neoplasm, unspecified: Secondary | ICD-10-CM

## 2015-12-06 DIAGNOSIS — Z315 Encounter for genetic counseling: Secondary | ICD-10-CM

## 2015-12-06 NOTE — Progress Notes (Signed)
REFERRING PROVIDER: MEISINGER, TODD, MD  PRIMARY PROVIDER:  [facility]  PRIMARY REASON FOR VISIT:  1. Family history of breast cancer in mother   2. Family history of prostate cancer   3. Family history of pancreatic cancer   4. Family history of thyroid cancer   5. Family history of cancer      HISTORY OF PRESENT ILLNESS:   [name] [name], a 34 y.o. female, was seen for a [facility] cancer genetics consultation at the request of Dr. Meisinger due to a family history of breast, prostate, and other cancers.  [name] [name] presents to clinic today with her mother, [name] [name], to discuss the possibility of a hereditary predisposition to cancer, genetic testing, and to further clarify her future cancer risks, as well as potential cancer risks for family members.    [name] [name] is a 34 y.o. female with no personal history of cancer.    HORMONAL RISK FACTORS:  Menarche was at age 11.  First live birth at age 17.  OCP use for approximately 5 years.  Ovaries intact: yes.  Hysterectomy: yes, hysterectomy and bilateral salpingectomy in 2014 for endometriosis.  Menopausal status: unknown.  HRT use: 0 years. Colonoscopy: yes; first was in January 2017 - found 3 hyperplastic colon polyps. Mammogram within the last year: yes; reports issues with dense breast tissue Number of breast biopsies: 0, but reports history of cyst aspiration Up to date with pelvic exams:  yes. 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 Asthma   . Allergy   . Depression   . Panic disorder   . Kidney stone   . GERD (gastroesophageal reflux disease)   . Constipation     Past Surgical History  Procedure Laterality Date  . Tonsillectomy and adenoidectomy    . Adiana    .  Dnc    . Tubal ligation    . Dilation and curettage of uterus    . Laparoscopic assisted vaginal hysterectomy N/A 05/31/2013    Procedure: LAPAROSCOPIC ASSISTED VAGINAL HYSTERECTOMY;  Surgeon: Todd Meisinger, MD;  Location: [facility];  Service: Gynecology;  Laterality: N/A;  . Bilateral salpingectomy Bilateral 05/31/2013    Procedure: BILATERAL SALPINGECTOMY;  Surgeon: Todd Meisinger, MD;  Location: [facility];  Service: Gynecology;  Laterality: Bilateral;  . Partial hysterectomy    . Kidney stone surgery    . Tonsillectomy    . Abdominal hysterectomy    . Knee arthroscopy Right   . Knee arthroscopy with drilling/microfracture Right 07/26/2014    Procedure: RIGHT KNEE ARTHROSCOPY WITH CHONDROPLASTY, MICROFRACTURE;  Surgeon: Naiping Michael Xu, MD;  Location: [facility];  Service: Orthopedics;  Laterality: Right;  . Knee arthroscopy with medial menisectomy Right 07/26/2014    Procedure: KNEE ARTHROSCOPY WITH MEDIAL MENISECTOMY;  Surgeon: Naiping Michael Xu, MD;  Location: [facility];  Service: Orthopedics;  Laterality: Right;  . Colonoscopy      Social History   Social History  . Marital Status: Divorced    Spouse Name: N/A  . Number of Children: N/A  . Years of Education: N/A   Social History Main Topics  . Smoking status: Never Smoker   . Smokeless tobacco: Never Used  . Alcohol Use: 0.0 oz/week    0 Standard drinks or equivalent   per week     Comment: couple times per month  . Drug Use: No  . Sexual Activity: Not on file   Other Topics Concern  . Not on file   Social History Narrative     FAMILY HISTORY:  We obtained a detailed, 4-generation family history.  Significant diagnoses are listed below: Family History  Problem Relation Age of Onset  . Hypertension Mother   . Diabetes Mother   . Breast cancer Mother 43    IDC; s/p lumpectomy, radiation, and arimidex  . Prostate cancer Paternal Grandfather     dx. 50s-60s  . Colon cancer Neg Hx   .  Rectal cancer Neg Hx   . Stomach cancer Neg Hx   . Esophageal cancer Neg Hx   . Other Sister     both full sisters also have dense breast tissue  . Breast cancer Maternal Grandmother 83    IDC  . Colon polyps Maternal Grandmother     approx 4-5 colon polyps  . Pancreatic cancer Other     maternal great aunt (MGF's sister) d. pancreatic cancer  . Cancer Other     maternal great aunt (MGF's sister) dx NOS cancer  . Cancer Other     maternal great grandmother (MGF's mother) d. NOS cancer in her 41s  . Cancer Cousin 25    paternal 1st cousin dx. w/ either throat or thyroid cancer; not a smoker  . Breast cancer Other     paternal great aunt (PGF's sister) dx. breast cancer, 100s    [name] [name] has one son and three daughers, ages 45-17.  She has two full sisters, both of whom are in their early 20s.  These sisters also have high breast density.  One of [name]'s nieces has [place] syndrome.  [name] also has one maternal half-sister and one maternal half-brother, ages 84 and 36, respectively.    [name] mother is currently 14 and was diagnosed with breast cancer at age 45.  She had genetic testing in December 2016 and allowed [place] permission to review her test result.  She had genetic testing via the Breast/Ovarian Cancer Panel through [employer].  Her result was negative, save for two variants of uncertain significance (VUSes)--one of which was found in one copy of the ATM gene, the other of which was found in one copy of the MSH6 gene.  [name] mother has one full brother and one full sister.  Her sister died at the age of 31 from an unspecified cause, but which may have been an accidental overdose.  Her brother is 65 and is cancer-free.  [name] and her mother report no cancer history for any of the maternal first cousins.  [name] maternal grandmother was diagnosed with breast cancer at 50  and died shortly after.  She had a history of approximately 4-5 colon polyps.  She had two full sisters who are in their 52s-80s and have never had cancer.  [name] maternal grandfather is currently 53 and has never had cancer.  He had five sisters and four brothers.  One sister died of pancreatic cancer; another died of an unspecified type of cancer.  His mother died of an unspecified type of cancer in her 23s.    [name] father is currently 47 and cancer-free.  He has two full brothers, two full sisters, and one paternal half-brother and half-sister.  None of his siblings have ever been diagnosed with cancer; all are  still living.  One older brother has two daughters and one son, and one of these daughters was diagnosed with either a throat or thyroid cancer at the age of 25 (she was not a smoker).  [name] [name]'s paternal grandmother is in her late 80s and cancer-free.  Her grandfather was diagnosed with prostate cancer in his 50s-60s; he is currently in his 80s.  He had approximately 16 siblings (10 sisters and 6 brothers).  One sister was diagnosed with breast cancer in her 50s.  [name] [name] also reports that several other of these great aunts/uncles also had cancer, but she is not sure what type or at what ages they were diagnosed.    [name] [name] and her mother are unaware of any additional family history of genetic testing for hereditary cancer.  Patient's maternal ancestors are of Native American - [place] and Caucasian/Irish descent, and paternal ancestors are of Caucasian descent. There is not reported Ashkenazi Jewish ancestry. There is no known consanguinity.  GENETIC COUNSELING ASSESSMENT: [name] [name] is a 34 y.o. female with a family history of breast and other cancers. We, therefore, discussed and recommended the following at today's visit.   DISCUSSION: We reviewed the characteristics, features and inheritance  patterns of hereditary cancer syndromes. We also discussed genetic testing, including the appropriate family members to test.  We discussed [name] [name]'s mother's previous negative genetic test result.  She was the best person in the family to have genetic testing, since she was diagnosed with breast cancer younger than age 50.  She recently had genetic testing in December 2016, and the testing ordered was a comprehensive-type breast panel.  No new breast-cancer related genes have been added since the time of her testing.  She had a negative genetic test other than two uncertain changes or variants of uncertain significance (VUSes) found in one copy each of the ATM and MSH6 genes.  We discussed that most of these get reclassified to benign changes once they are finally updated by the lab.  I will call [facility], however, to see if there have been updates to these.  Discussed that, since her mom has had an essentially negative genetic test (we do not test relatives for uncertain changes) and there is no additional paternal family history of cancer that would qualify her for genetic testing for hereditary cancer, that there is likely no reason to test [name] [name] at this time.  I also discussed with her that self-pay genetic testing is still available to her (upfront cost of $260 paid with credit card), as she does not need to meet any specific criteria for this test and that it would look at the same genes.  She is not interested in self-pay testing at this time.  Thus, we did not recommend any genetic testing, at this time, and recommended [name] [name] continue to follow the cancer screening guidelines given by her primary healthcare provider.  [name] [name] still has a family history of breast cancer in her mother and her grandmother that has not yet been explained.  Based on the patient's personal and family history, statistical models (Tyrer-Cuzick/IBIS and Gail models)  and  literature data were used to estimate her risk of developing breast cancer. These estimate her lifetime risk of developing breast cancer to be approximately 18.2% to 19.3%. This estimation does not take into account any breast density information.  The patient's lifetime breast cancer risk is a preliminary estimate based on available information using one of several models endorsed by the   American Cancer Society (ACS). The ACS recommends consideration of breast MRI screening as an adjunct to mammography for patients at high risk (defined as 20% or greater lifetime risk). A more detailed breast cancer risk assessment can be considered, if clinically indicated.  Once factoring in information on high breast density, it is likely that this risk will be greater than 20% and breast MRIs should be considered as an option.  PLAN: There was no indication to perform additional genetic testing for [name] [name], since her mother has already had negative genetic testing recently and it is highly unlikely we would get a positive test result for her (since her mother has already tested negative).  We discussed the option for self-pay genetic testing, but [name] [name] did not wish to pursue this genetic testing at today's visit. We understand this decision, and remain available to coordinate genetic testing at any time in the future. We, therefore, recommend [name] [name] continue to follow the cancer screening guidelines given by her primary healthcare provider.  Thank you for the referral and allowing us to share in the care of your patient.   Kayla Boggs, MS, CGC Certified Genetic Counselor [email or web address] Phone: [phone]  The patient was seen for a total of 60 minutes in face-to-face genetic counseling.  This patient was discussed with Drs. Magrinat, Gudena and/or Feng who agrees with the above.    _______________________________________________________________________ For  Office Staff:  Number of people involved in session: 2 Was an Intern/ student involved with case: no    

## 2016-01-14 ENCOUNTER — Emergency Department (HOSPITAL_COMMUNITY)
Admission: EM | Admit: 2016-01-14 | Discharge: 2016-01-14 | Disposition: A | Discharge status: Home / Self Care | Payer: Medicaid Other | Attending: Emergency Medicine | Admitting: Emergency Medicine

## 2016-01-14 ENCOUNTER — Emergency Department (HOSPITAL_COMMUNITY): Payer: Medicaid Other

## 2016-01-14 ENCOUNTER — Encounter (HOSPITAL_COMMUNITY): Payer: Self-pay

## 2016-01-14 ENCOUNTER — Other Ambulatory Visit: Payer: Self-pay

## 2016-01-14 DIAGNOSIS — R0789 Other chest pain: Secondary | ICD-10-CM | POA: Insufficient documentation

## 2016-01-14 DIAGNOSIS — F329 Major depressive disorder, single episode, unspecified: Secondary | ICD-10-CM | POA: Insufficient documentation

## 2016-01-14 DIAGNOSIS — J45909 Unspecified asthma, uncomplicated: Secondary | ICD-10-CM | POA: Diagnosis not present

## 2016-01-14 LAB — BASIC METABOLIC PANEL
Anion gap: 7 (ref 5–15)
BUN: 13 mg/dL (ref 6–20)
CALCIUM: 9.3 mg/dL (ref 8.9–10.3)
CHLORIDE: 109 mmol/L (ref 101–111)
CO2: 23 mmol/L (ref 22–32)
Creatinine, Ser: 0.89 mg/dL (ref 0.44–1.00)
GLUCOSE: 96 mg/dL (ref 65–99)
POTASSIUM: 4 mmol/L (ref 3.5–5.1)
SODIUM: 139 mmol/L (ref 135–145)

## 2016-01-14 LAB — I-STAT TROPONIN, ED: TROPONIN I, POC: 0 ng/mL (ref 0.00–0.08)

## 2016-01-14 LAB — CBC
HCT: 42.3 % (ref 36.0–46.0)
HEMOGLOBIN: 13.9 g/dL (ref 12.0–15.0)
MCH: 31.6 pg (ref 26.0–34.0)
MCHC: 32.9 g/dL (ref 30.0–36.0)
MCV: 96.1 fL (ref 78.0–100.0)
Platelets: 196 10*3/uL (ref 150–400)
RBC: 4.4 MIL/uL (ref 3.87–5.11)
RDW: 12.2 % (ref 11.5–15.5)
WBC: 5.4 10*3/uL (ref 4.0–10.5)

## 2016-01-14 LAB — I-STAT BETA HCG BLOOD, ED (MC, WL, AP ONLY): I-stat hCG, quantitative: <5 m[IU]/mL (ref <5)

## 2016-01-14 LAB — D-DIMER, QUANTITATIVE (NOT AT ARMC): D DIMER QUANT: 0.29 ug{FEU}/mL (ref 0.00–0.50)

## 2016-01-14 MED ORDER — KETOROLAC TROMETHAMINE 60 MG/2ML IM SOLN
30.0000 mg | Freq: Once | INTRAMUSCULAR | Status: AC
  Administered 2016-01-14: 30 mg via INTRAMUSCULAR
  Filled 2016-01-14: qty 2

## 2016-01-14 NOTE — ED Notes (Signed)
Pt verbalized understanding of d/c instructions and follow-up care. No further questions/concerns, VSS, ambulatory w/ steady gait (refused wheelchair) 

## 2016-01-14 NOTE — ED Notes (Signed)
Gave Pt [place] sandwich per [employer]

## 2016-01-14 NOTE — ED Notes (Signed)
Pt ambulatory w/ steady gait to restroom. 

## 2016-01-14 NOTE — ED Notes (Signed)
Patient complains of sharp/tingling chest pain and neck pain x 1 day. Not sure if her asthma or panic attack. Took her anxiety meds with no relief. Appears anxious. NAD

## 2016-01-14 NOTE — ED Provider Notes (Signed)
CSN: [id number]     Arrival date & time 01/14/16  [id number] History   First MD Initiated Contact with Patient 01/14/16 [id number]     Chief Complaint  Patient presents with  . Chest Pain     Patient is a 35 y.o. female presenting with chest pain. The history is provided by the patient. No language interpreter was used.  Chest Pain  [name] [name] is a 35 y.o. female who presents to the Emergency Department complaining of chest pain. She reports 1 day of constant chest pain. Pain is described as a tightness with intermittent sharp sensation across her central and left chest. She also has pain in her left upper back. Pain is worse with deep breaths, movement. She has associated shortness of breath. She was sweating last night. No fever, cough, abdominal pain, leg swelling or pain. She has a history of panic attacks but this feels different than her prior panic attacks. No hx/o DVT/PE. She does not take any oral contraceptives.  Past Medical History  Diagnosis Date  . Asthma   . Allergy   . Depression   . Panic disorder   . Kidney stone   . GERD (gastroesophageal reflux disease)   . Constipation    Past Surgical History  Procedure Laterality Date  . Tonsillectomy and adenoidectomy    . Adiana    . Dnc    . Tubal ligation    . Dilation and curettage of uterus    . Laparoscopic assisted vaginal hysterectomy N/A 05/31/2013    Procedure: LAPAROSCOPIC ASSISTED VAGINAL HYSTERECTOMY;  Surgeon: Cheri Fowler, MD;  Location: [facility];  Service: Gynecology;  Laterality: N/A;  . Bilateral salpingectomy Bilateral 05/31/2013    Procedure: BILATERAL SALPINGECTOMY;  Surgeon: Cheri Fowler, MD;  Location: [facility];  Service: Gynecology;  Laterality: Bilateral;  . Partial hysterectomy    . Kidney stone surgery    . Tonsillectomy    . Abdominal hysterectomy    . Knee arthroscopy Right   . Knee arthroscopy with drilling/microfracture Right 07/26/2014    Procedure: RIGHT KNEE ARTHROSCOPY WITH  CHONDROPLASTY, MICROFRACTURE;  Surgeon: Marianna Payment, MD;  Location: [facility];  Service: Orthopedics;  Laterality: Right;  . Knee arthroscopy with medial menisectomy Right 07/26/2014    Procedure: KNEE ARTHROSCOPY WITH MEDIAL MENISECTOMY;  Surgeon: Marianna Payment, MD;  Location: [facility];  Service: Orthopedics;  Laterality: Right;  . Colonoscopy     Family History  Problem Relation Age of Onset  . Hypertension Mother   . Diabetes Mother   . Breast cancer Mother 38    IDC; s/p lumpectomy, radiation, and arimidex  . Prostate cancer Paternal Grandfather     dx. 50s-60s  . Colon cancer Neg Hx   . Rectal cancer Neg Hx   . Stomach cancer Neg Hx   . Esophageal cancer Neg Hx   . Other Sister     both full sisters also have dense breast tissue  . Breast cancer Maternal Grandmother 83    IDC  . Colon polyps Maternal Grandmother     approx 4-5 colon polyps  . Pancreatic cancer Other     maternal great aunt (MGF's sister) d. pancreatic cancer  . Cancer Other     maternal great aunt (MGF's sister) dx NOS cancer  . Cancer Other     maternal great grandmother (MGF's mother) d. NOS cancer in her 1s  . Cancer Cousin 25    paternal 1st cousin dx. w/ either  throat or thyroid cancer; not a smoker  . Breast cancer Other     paternal great aunt (PGF's sister) dx. breast cancer, 37s   Social History  Substance Use Topics  . Smoking status: Never Smoker   . Smokeless tobacco: Never Used  . Alcohol Use: 0.0 oz/week    0 Standard drinks or equivalent per week     Comment: couple times per month   OB History    No data available     Review of Systems  Cardiovascular: Positive for chest pain.  All other systems reviewed and are negative.     Allergies  Morphine and related and Nitrofurantoin monohyd macro  Home Medications   Prior to Admission medications   Medication Sig Start Date End Date Taking? Authorizing Provider  albuterol (PROVENTIL  HFA;VENTOLIN HFA) 108 (90 BASE) MCG/ACT inhaler Inhale 1-2 puffs into the lungs every 6 (six) hours as needed for wheezing or shortness of breath. 12/11/13  Yes Bjorn Pippin, PA-C  busPIRone (BUSPAR) 5 MG tablet Take 5 mg by mouth 3 (three) times daily.   Yes Historical Provider, MD  clonazePAM (KLONOPIN) 0.5 MG tablet Take 0.5 mg by mouth 2 (two) times daily as needed for anxiety.   Yes Historical Provider, MD  Fluticasone-Salmeterol (ADVAIR) 250-50 MCG/DOSE AEPB Inhale 2 puffs into the lungs 2 (two) times daily. 04/23/14  Yes Gregor Hams, MD  HYDROcodone-acetaminophen (NORCO/VICODIN) 5-325 MG tablet Take 1 tablet by mouth 3 (three) times daily as needed for moderate pain.   Yes Historical Provider, MD  loratadine (CLARITIN) 10 MG tablet Take 10 mg by mouth daily.   Yes Historical Provider, MD  ipratropium (ATROVENT) 0.06 % nasal spray Place 2 sprays into both nostrils 4 (four) times daily. Patient not taking: Reported on 01/14/2016 11/21/15   Janne Napoleon, NP   BP 112/82 mmHg  Pulse 62  Temp(Src) 98.4 F (36.9 C) (Oral)  Resp 15  Ht 5\' 3"  (1.6 m)  Wt 135 lb (61.236 kg)  BMI 23.92 kg/m2  SpO2 98%  LMP 05/09/2013 Physical Exam  Constitutional: She is oriented to person, place, and time. She appears well-developed and well-nourished.  HENT:  Head: Normocephalic and atraumatic.  Cardiovascular: Normal rate and regular rhythm.   No murmur heard. Pulmonary/Chest: Effort normal and breath sounds normal. No respiratory distress. She exhibits no tenderness.  Abdominal: Soft. There is no tenderness. There is no rebound and no guarding.  Musculoskeletal: She exhibits no edema or tenderness.  Neurological: She is alert and oriented to person, place, and time.  Skin: Skin is warm and dry.  Psychiatric: She has a normal mood and affect. Her behavior is normal.  Nursing note and vitals reviewed.   ED Course  Procedures (including critical care time) Labs Review Labs Reviewed  BASIC METABOLIC  PANEL  CBC  D-DIMER, QUANTITATIVE (NOT AT [facility])  I-STAT TROPOININ, ED  I-STAT BETA HCG BLOOD, ED ([facility], [facility], [facility] ONLY)    Imaging Review Dg Chest 2 View  01/14/2016  CLINICAL DATA:  Sharp mid chest pain and neck pain for 1 day. EXAM: CHEST  2 VIEW COMPARISON:  09/09/2008 FINDINGS: The heart size and mediastinal contours are within normal limits. Both lungs are clear. The visualized skeletal structures are unremarkable. IMPRESSION: No active cardiopulmonary disease. Electronically Signed   By: Rolm Baptise M.D.   On: 01/14/2016 09:04   I have personally reviewed and evaluated these images and lab results as part of my medical decision-making.   EKG Interpretation  Date/Time:  Monday January 14 2016 07:40:41 EDT Ventricular Rate:  83 PR Interval:  112 QRS Duration: 82 QT Interval:  370 QTC Calculation: 434 R Axis:   87 Text Interpretation:  Normal sinus rhythm Normal ECG Confirmed by Hazle Coca [phone]) on 01/14/2016 10:44:27 AM      MDM   Final diagnoses:  Chest wall pain    Patient here for evaluation of chest pain that is pleuritic in nature and worse with movement. Presentation is not consistent with ACS, dissection, pneumonia. She is low risk for PE and d-dimer is negative. Treating for musculoskeletal chest pain with Toradol. Discussed home care, continue NSAIDs, outpatient follow-up, return precautions.    Quintella Reichert, MD 01/14/16 1045

## 2016-01-14 NOTE — Discharge Instructions (Signed)

## 2016-04-29 IMAGING — MR MR KNEE*R* W/O CM
4 of 6 series · 18 of 40 positions shown · non-contrast
Comparison: 09/11/2012

CLINICAL DATA: Right medial posterior knee pain for 5 days. Medial
meniscal tear surgery September 2011.

EXAM:
MRI OF THE RIGHT KNEE WITHOUT CONTRAST
TECHNIQUE: Multiplanar, multisequence MR imaging of the knee was performed. No
intravenous contrast was administered.

[Series 3: PD fat-sat · axial · 4.0mm · 0.27mm/px · z∈[-62,+48]mm · 7 of 23 slices shown (1 of 4)]
[im 1/23]
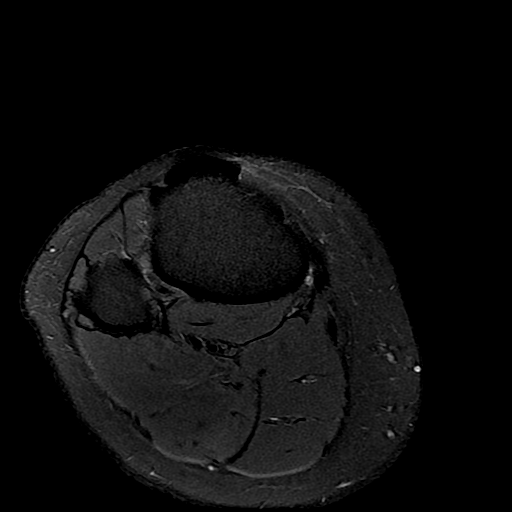
[im 4/23]
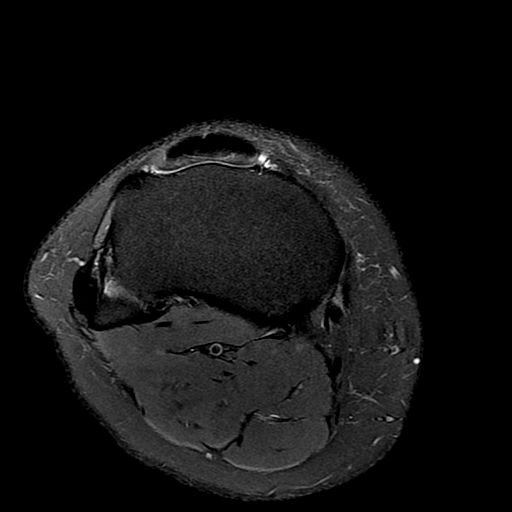
[im 8/23]
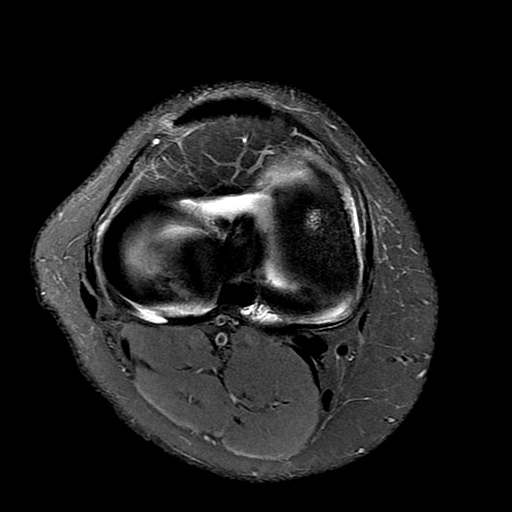
[im 12/23]
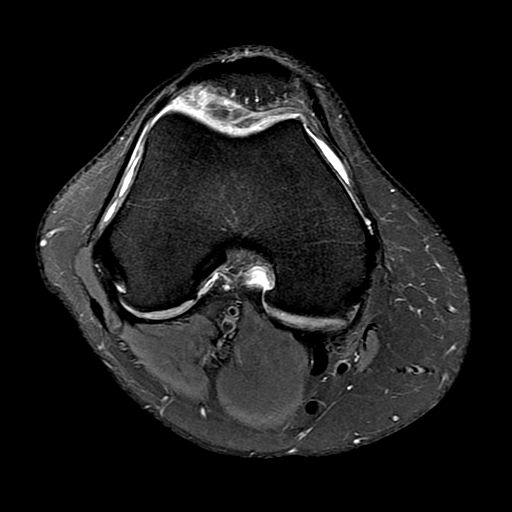
[im 15/23]
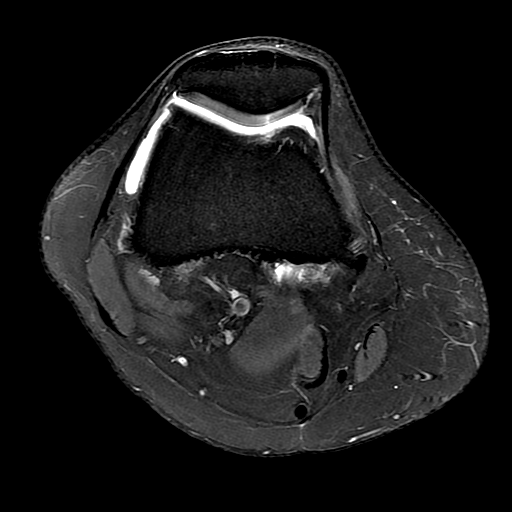
[im 19/23]
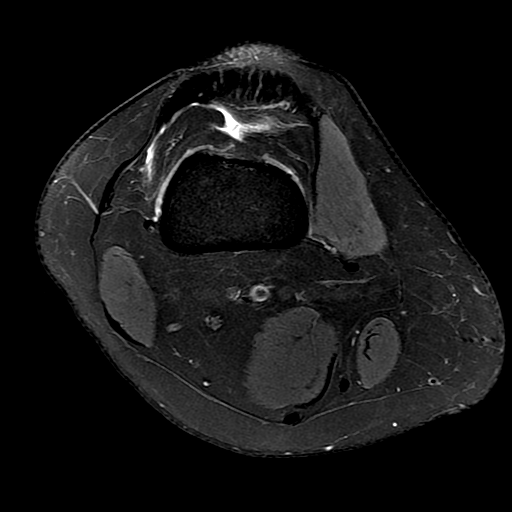
[im 23/23]
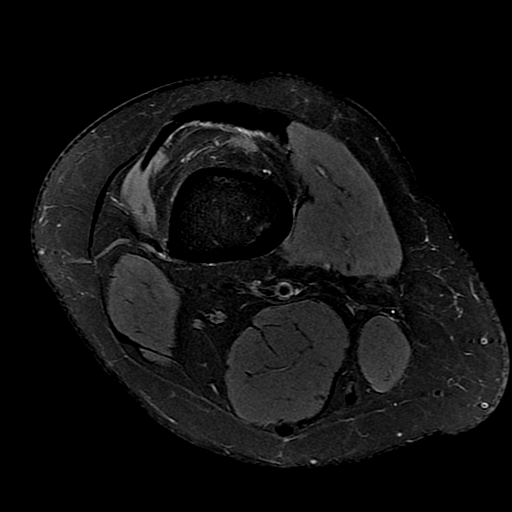

[Series 4: PD fat-sat · sagittal · 3.0mm · 0.27mm/px · 5 of 25 slices shown (2 of 4)]
[im 1/25]
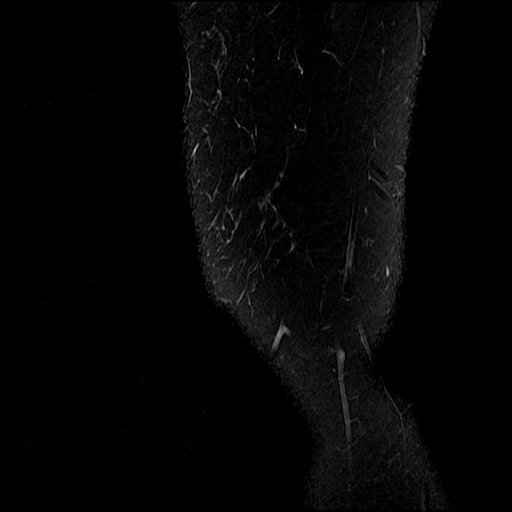
[im 4/25]
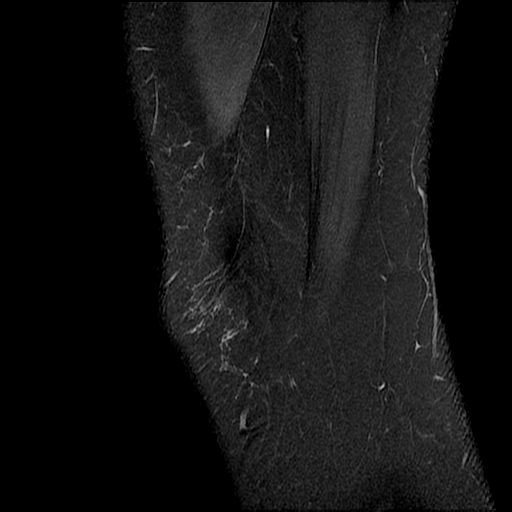
[im 7/25]
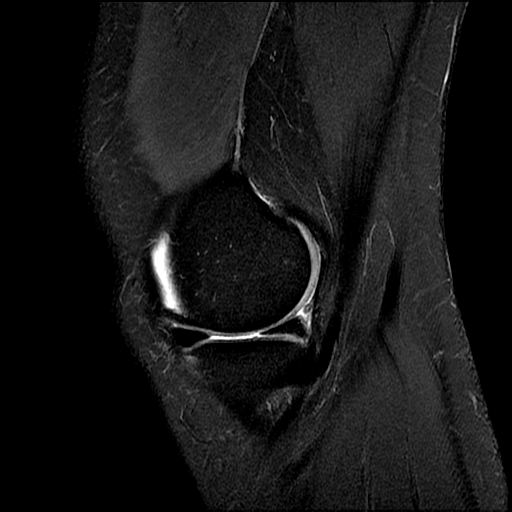
[im 14/25]
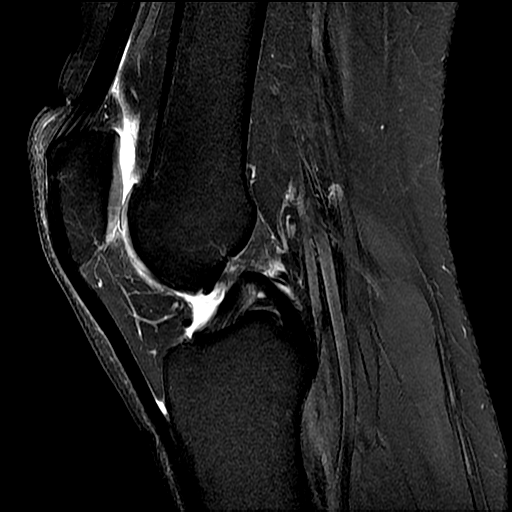
[im 21/25]
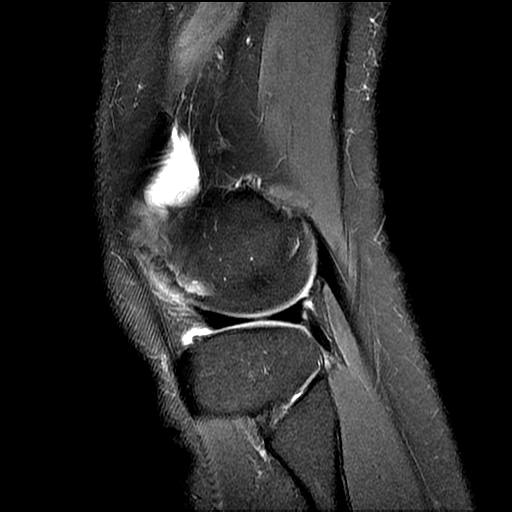

[Series 7: PD fat-sat · coronal · 4.0mm · 0.31mm/px · 3 of 21 slices shown (3 of 4)]
[im 4/21]
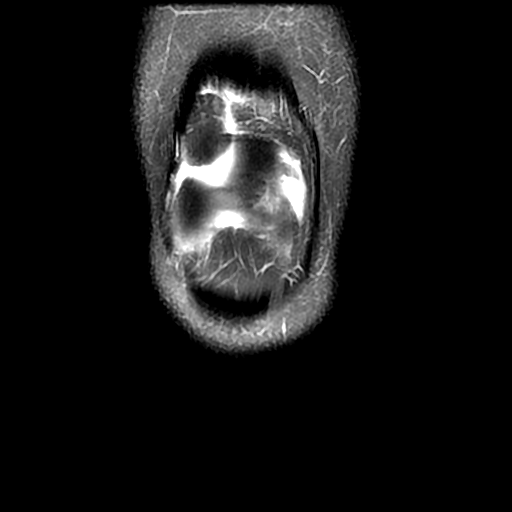
[im 11/21]
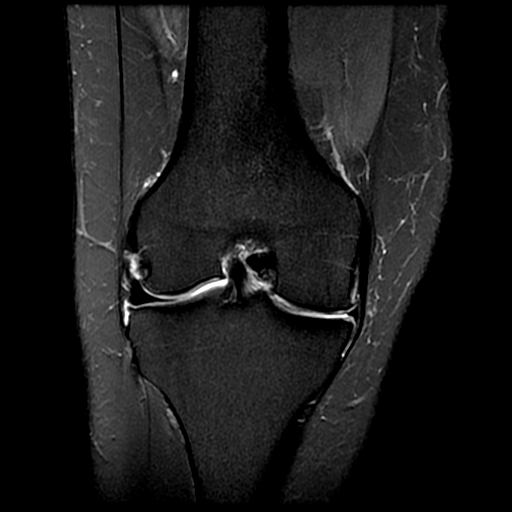
[im 17/21]
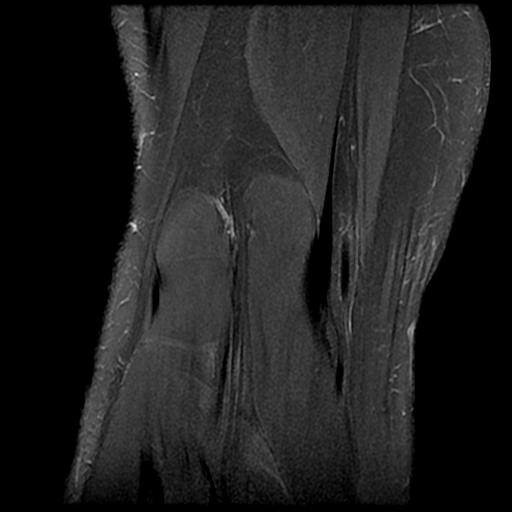

[Series 8: PD fat-sat · coronal · 2.0mm · 0.31mm/px · 3 of 13 slices shown (4 of 4)]
[im 1/13]
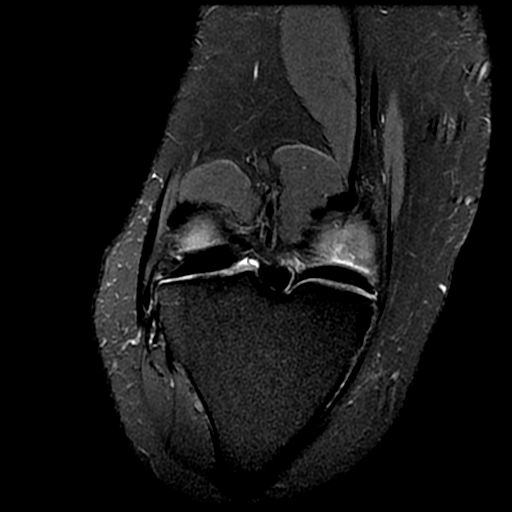
[im 9/13]
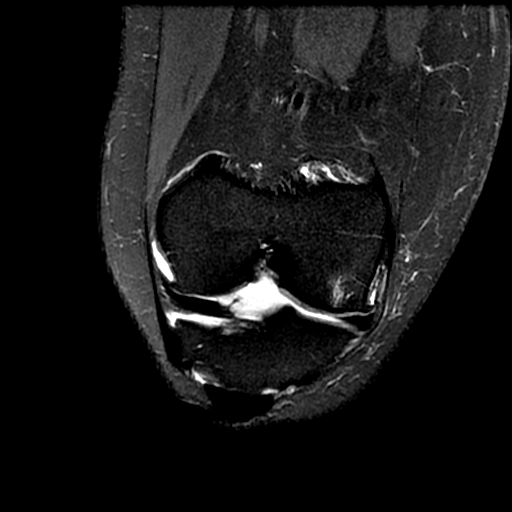
[im 13/13]
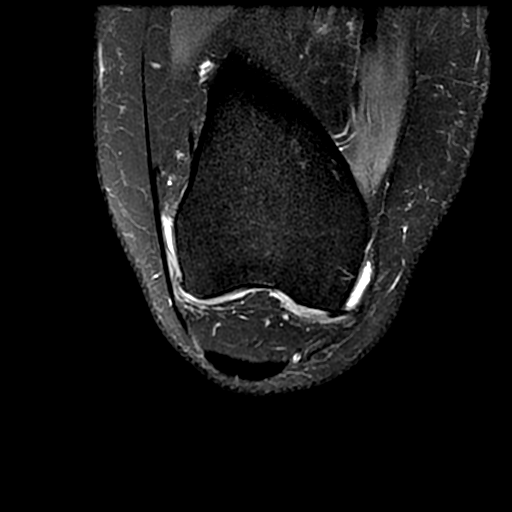

[18 of 40 positions shown; findings below may reference images not displayed]

FINDINGS: MENISCI

Medial meniscus: There is severe attenuation of the body of the
medial meniscus which likely reflects prior meniscectomy. There is
irregularity along the inferior surface of the posterior horn-body
junction of the medial meniscus which may be related to prior
debridement. Mild blunting of the free edge of the posterior horn of
the medial meniscus likely reflecting a small radial tear.

Lateral meniscus:  Intact.

LIGAMENTS

Cruciates:  Intact ACL and PCL.

Collaterals: Medial collateral ligament is intact. Lateral
collateral ligament complex is intact.

CARTILAGE

Patellofemoral: Small chondral defect involving the patellar apex
measuring 4 mm with mild delamination of the medial patellar facet
cartilage measuring 3 mm.

Medial: High-grade partial thickness cartilage loss of the medial
femoral condyle and medial tibial plateau with subchondral reactive
marrow edema in the medial femoral condyle.

Lateral: Small focal full-thickness cartilage defect measuring 2 mm
3 mm involving the lateral femoral condyle.

Joint: Edema in superolateral Hoffa's fat. No significant joint
effusion. No plical thickening.

Popliteal Fossa:  Intact popliteus tendon.  No Baker cyst.

Extensor Mechanism:  Intact.

Bones: No other focal marrow signal abnormality. No fracture or
dislocation.
IMPRESSION: 1. Severe attenuation of the body of the medial meniscus which
likely reflects prior meniscectomy. Irregularity along the inferior
surface of the posterior horn-body junction of the medial meniscus
which may be related to prior debridement. Mild blunting of the free
edge of the posterior horn of the medial meniscus likely reflecting
a small radial tear.
2. Tricompartmental cartilage abnormalities as described above.
3. Edema in superolateral Hoffa's fat as can be seen in
patellofemoral impingement syndrome.

## 2016-09-19 ENCOUNTER — Other Ambulatory Visit: Payer: Self-pay | Admitting: Internal Medicine

## 2016-09-19 DIAGNOSIS — Z1231 Encounter for screening mammogram for malignant neoplasm of breast: Secondary | ICD-10-CM

## 2016-09-22 ENCOUNTER — Encounter (INDEPENDENT_AMBULATORY_CARE_PROVIDER_SITE_OTHER): Payer: Self-pay | Admitting: Orthopaedic Surgery

## 2016-09-22 ENCOUNTER — Ambulatory Visit (INDEPENDENT_AMBULATORY_CARE_PROVIDER_SITE_OTHER): Payer: Medicaid Other | Admitting: Orthopaedic Surgery

## 2016-09-22 DIAGNOSIS — M25561 Pain in right knee: Secondary | ICD-10-CM | POA: Diagnosis not present

## 2016-09-22 DIAGNOSIS — M25562 Pain in left knee: Secondary | ICD-10-CM

## 2016-09-22 DIAGNOSIS — G8929 Other chronic pain: Secondary | ICD-10-CM

## 2016-09-22 MED ORDER — BUPIVACAINE HCL 0.5 % IJ SOLN
2.0000 mL | INTRAMUSCULAR | Status: AC | PRN
  Administered 2016-09-22: 2 mL via INTRA_ARTICULAR

## 2016-09-22 MED ORDER — METHYLPREDNISOLONE ACETATE 40 MG/ML IJ SUSP
40.0000 mg | INTRAMUSCULAR | Status: AC | PRN
  Administered 2016-09-22: 40 mg via INTRA_ARTICULAR

## 2016-09-22 MED ORDER — LIDOCAINE HCL 1 % IJ SOLN
2.0000 mL | INTRAMUSCULAR | Status: AC | PRN
  Administered 2016-09-22: 2 mL

## 2016-09-22 NOTE — Progress Notes (Signed)
Office Visit Note   Patient: [name] [name]           Date of Birth: 07/17/1980           MRN: [id number] Visit Date: 09/22/2016              Requested by: [facility] [address] [place], [place] [address] PCP: [facility]   Assessment & Plan: Visit Diagnoses:  1. Chronic pain of both knees     Plan: Bilateral knee injections were performed today without immediate complications. Follow-up as needed.  Follow-Up Instructions: Return if symptoms worsen or fail to improve.   Orders:  No orders of the defined types were placed in this encounter.  No orders of the defined types were placed in this encounter.     Procedures: Large Joint Inj Date/Time: 09/22/2016 4:49 PM Performed by: Leandrew Koyanagi Authorized by: Leandrew Koyanagi   Consent Given by:  Patient Timeout: prior to procedure the correct patient, procedure, and site was verified   Indications:  Pain Location:  Knee Site:  R knee Prep: patient was prepped and draped in usual sterile fashion   Needle Size:  22 G Ultrasound Guidance: No   Fluoroscopic Guidance: No   Arthrogram: No   Patient tolerance:  Patient tolerated the procedure well with no immediate complications Large Joint Inj Date/Time: 09/22/2016 4:49 PM Performed by: Leandrew Koyanagi Authorized by: Leandrew Koyanagi   Consent Given by:  Patient Timeout: prior to procedure the correct patient, procedure, and site was verified   Indications:  Pain Location:  Knee Site:  R knee Prep: patient was prepped and draped in usual sterile fashion   Needle Size:  22 G Ultrasound Guidance: No   Fluoroscopic Guidance: No   Arthrogram: No   Medications:  2 mL lidocaine 1 %; 2 mL bupivacaine 0.5 %; 40 mg methylPREDNISolone acetate 40 MG/ML Patient tolerance:  Patient tolerated the procedure well with no immediate complications     Clinical Data: No additional findings.   Subjective: Chief Complaint  Patient presents with  .  Right Knee - Pain  . Left Knee - Pain    [name] comes in today for bilateral knee pain requesting injections. She is also complaining of bilateral knee effusions. She denies any numbness or tingling.    Review of Systems  Constitutional: Negative.   HENT: Negative.   Eyes: Negative.   Respiratory: Negative.   Cardiovascular: Negative.   Endocrine: Negative.   Musculoskeletal: Negative.   Neurological: Negative.   Hematological: Negative.   Psychiatric/Behavioral: Negative.   All other systems reviewed and are negative.    Objective: Vital Signs: LMP 05/09/2013   Physical Exam  Constitutional: She is oriented to person, place, and time. She appears well-developed and well-nourished.  Pulmonary/Chest: Effort normal.  Neurological: She is alert and oriented to person, place, and time.  Skin: Skin is warm. Capillary refill takes less than 2 seconds.  Psychiatric: She has a normal mood and affect. Her behavior is normal. Judgment and thought content normal.  Nursing note and vitals reviewed.   Ortho Exam Bilateral knee exam shows small effusions. She has painful range of motion. Otherwise exam is stable. Specialty Comments:  No specialty comments available.  Imaging: No results found.   PMFS History: Patient Active Problem List   Diagnosis Date Noted  . Family history of breast cancer in mother 12/06/2015  . Menorrhagia 05/31/2013  . Dysmenorrhea 05/31/2013  . Dyspareunia 05/31/2013  .  Knee pain 08/24/2012  . Asthma 08/05/2011   Past Medical History:  Diagnosis Date  . Allergy   . Asthma   . Constipation   . Depression   . GERD (gastroesophageal reflux disease)   . Kidney stone   . Panic disorder     Family History  Problem Relation Age of Onset  . Hypertension Mother   . Diabetes Mother   . Breast cancer Mother 58    IDC; s/p lumpectomy, radiation, and arimidex  . Prostate cancer Paternal Grandfather     dx. 50s-60s  . Colon cancer Neg Hx   .  Rectal cancer Neg Hx   . Stomach cancer Neg Hx   . Esophageal cancer Neg Hx   . Other Sister     both full sisters also have dense breast tissue  . Breast cancer Maternal Grandmother 83    IDC  . Colon polyps Maternal Grandmother     approx 4-5 colon polyps  . Pancreatic cancer Other     maternal great aunt (MGF's sister) d. pancreatic cancer  . Cancer Other     maternal great aunt (MGF's sister) dx NOS cancer  . Cancer Other     maternal great grandmother (MGF's mother) d. NOS cancer in her 38s  . Cancer Cousin 25    paternal 1st cousin dx. w/ either throat or thyroid cancer; not a smoker  . Breast cancer Other     paternal great aunt (PGF's sister) dx. breast cancer, 75s    Past Surgical History:  Procedure Laterality Date  . ABDOMINAL HYSTERECTOMY    . adiana    . BILATERAL SALPINGECTOMY Bilateral 05/31/2013   Procedure: BILATERAL SALPINGECTOMY;  Surgeon: Cheri Fowler, MD;  Location: [facility];  Service: Gynecology;  Laterality: Bilateral;  . COLONOSCOPY    . DILATION AND CURETTAGE OF UTERUS    . DNC    . KIDNEY STONE SURGERY    . KNEE ARTHROSCOPY Right   . KNEE ARTHROSCOPY WITH DRILLING/MICROFRACTURE Right 07/26/2014   Procedure: RIGHT KNEE ARTHROSCOPY WITH CHONDROPLASTY, MICROFRACTURE;  Surgeon: Marianna Payment, MD;  Location: [facility];  Service: Orthopedics;  Laterality: Right;  . KNEE ARTHROSCOPY WITH MEDIAL MENISECTOMY Right 07/26/2014   Procedure: KNEE ARTHROSCOPY WITH MEDIAL MENISECTOMY;  Surgeon: Marianna Payment, MD;  Location: [facility];  Service: Orthopedics;  Laterality: Right;  . LAPAROSCOPIC ASSISTED VAGINAL HYSTERECTOMY N/A 05/31/2013   Procedure: LAPAROSCOPIC ASSISTED VAGINAL HYSTERECTOMY;  Surgeon: Cheri Fowler, MD;  Location: [facility];  Service: Gynecology;  Laterality: N/A;  . PARTIAL HYSTERECTOMY    . TONSILLECTOMY    . TONSILLECTOMY AND ADENOIDECTOMY    . TUBAL LIGATION     Social History   Occupational History  .  Not on file.   Social History Main Topics  . Smoking status: Never Smoker  . Smokeless tobacco: Never Used  . Alcohol use 0.0 oz/week     Comment: couple times per month  . Drug use: No  . Sexual activity: Not on file

## 2016-10-13 ENCOUNTER — Ambulatory Visit
Admission: RE | Admit: 2016-10-13 | Discharge: 2016-10-13 | Disposition: A | Discharge status: Home / Self Care | Payer: Medicaid Other | Source: Ambulatory Visit | Attending: Internal Medicine | Admitting: Internal Medicine

## 2016-10-13 DIAGNOSIS — Z1231 Encounter for screening mammogram for malignant neoplasm of breast: Secondary | ICD-10-CM

## 2016-10-15 ENCOUNTER — Other Ambulatory Visit: Payer: Self-pay | Admitting: Internal Medicine

## 2016-10-15 DIAGNOSIS — R928 Other abnormal and inconclusive findings on diagnostic imaging of breast: Secondary | ICD-10-CM

## 2016-10-20 ENCOUNTER — Other Ambulatory Visit: Payer: Self-pay | Admitting: Internal Medicine

## 2016-10-20 ENCOUNTER — Ambulatory Visit
Admission: RE | Admit: 2016-10-20 | Discharge: 2016-10-20 | Disposition: A | Discharge status: Home / Self Care | Payer: Medicaid Other | Source: Ambulatory Visit | Attending: Internal Medicine | Admitting: Internal Medicine

## 2016-10-20 DIAGNOSIS — R928 Other abnormal and inconclusive findings on diagnostic imaging of breast: Secondary | ICD-10-CM

## 2017-06-09 ENCOUNTER — Ambulatory Visit (INDEPENDENT_AMBULATORY_CARE_PROVIDER_SITE_OTHER): Payer: Medicaid Other | Admitting: Orthopaedic Surgery

## 2017-06-09 ENCOUNTER — Encounter (INDEPENDENT_AMBULATORY_CARE_PROVIDER_SITE_OTHER): Payer: Self-pay | Admitting: Orthopaedic Surgery

## 2017-06-09 VITALS — Ht 63.0 in | Wt 135.0 lb

## 2017-06-09 DIAGNOSIS — M1712 Unilateral primary osteoarthritis, left knee: Secondary | ICD-10-CM | POA: Diagnosis not present

## 2017-06-09 DIAGNOSIS — M1711 Unilateral primary osteoarthritis, right knee: Secondary | ICD-10-CM | POA: Diagnosis not present

## 2017-06-09 MED ORDER — LIDOCAINE HCL 1 % IJ SOLN
2.0000 mL | INTRAMUSCULAR | Status: AC | PRN
  Administered 2017-06-09: 2 mL

## 2017-06-09 MED ORDER — BUPIVACAINE HCL 0.5 % IJ SOLN
2.0000 mL | INTRAMUSCULAR | Status: AC | PRN
  Administered 2017-06-09: 2 mL via INTRA_ARTICULAR

## 2017-06-09 MED ORDER — METHYLPREDNISOLONE ACETATE 40 MG/ML IJ SUSP
40.0000 mg | INTRAMUSCULAR | Status: AC | PRN
  Administered 2017-06-09: 40 mg via INTRA_ARTICULAR

## 2017-06-09 NOTE — Progress Notes (Signed)
Office Visit Note   Patient: [name] [name]           Date of Birth: Sep 15, 1980           MRN: [id number] Visit Date: 06/09/2017              Requested by: [facility], [place] [address], [place] [address] PCP: Clinic, General Medical   Assessment & Plan: Visit Diagnoses:  1. Unilateral primary osteoarthritis, right knee   2. Unilateral primary osteoarthritis, left knee     Plan: Impression is stable bilateral knee degenerative joint disease.  Bilateral knee injections were performed today.  May need to consider x-rays or MRI if patient does not get as much relief from these injections to monitor for progression of her disease.  Follow-Up Instructions: Return if symptoms worsen or fail to improve.   Orders:  No orders of the defined types were placed in this encounter.  No orders of the defined types were placed in this encounter.     Procedures: Large Joint Inj: bilateral knee on 06/09/2017 8:03 PM Indications: pain Details: 22 G needle  Arthrogram: No  Medications (Right): 2 mL lidocaine 1 %; 2 mL bupivacaine 0.5 %; 40 mg methylPREDNISolone acetate 40 MG/ML Medications (Left): 2 mL lidocaine 1 %; 2 mL bupivacaine 0.5 %; 40 mg methylPREDNISolone acetate 40 MG/ML Outcome: tolerated well, no immediate complications Patient was prepped and draped in the usual sterile fashion.       Clinical Data: No additional findings.   Subjective: Chief Complaint  Patient presents with  . Right Knee - Pain  . Left Knee - Pain    Patient comes in today for bilateral knee pain.  She has age advanced degenerative joint disease of her knees based on MRI.  She is requesting another injection.  Her last injection was in March.    Review of Systems   Objective: Vital Signs: Ht 5\' 3"  (1.6 m)   Wt 135 lb (61.2 kg)   LMP 05/09/2013   BMI 23.91 kg/m   Physical Exam  Ortho Exam Bilateral knee exam shows no joint effusion.  Stable exam. Specialty Comments:    No specialty comments available.  Imaging: No results found.   PMFS History: Patient Active Problem List   Diagnosis Date Noted  . Unilateral primary osteoarthritis, right knee 06/09/2017  . Unilateral primary osteoarthritis, left knee 06/09/2017  . Family history of breast cancer in mother 12/06/2015  . Menorrhagia 05/31/2013  . Dysmenorrhea 05/31/2013  . Dyspareunia 05/31/2013  . Knee pain 08/24/2012  . Asthma 08/05/2011   Past Medical History:  Diagnosis Date  . Allergy   . Asthma   . Constipation   . Depression   . GERD (gastroesophageal reflux disease)   . Kidney stone   . Panic disorder     Family History  Problem Relation Age of Onset  . Hypertension Mother   . Diabetes Mother   . Breast cancer Mother 88       IDC; s/p lumpectomy, radiation, and arimidex  . Prostate cancer Paternal Grandfather        dx. 50s-60s  . Other Sister        both full sisters also have dense breast tissue  . Breast cancer Maternal Grandmother 83       IDC  . Colon polyps Maternal Grandmother        approx 4-5 colon polyps  . Pancreatic cancer Other        maternal  great aunt (MGF's sister) d. pancreatic cancer  . Cancer Other        maternal great aunt (MGF's sister) dx NOS cancer  . Cancer Other        maternal great grandmother (MGF's mother) d. NOS cancer in her 6s  . Cancer Cousin 25       paternal 1st cousin dx. w/ either throat or thyroid cancer; not a smoker  . Breast cancer Other        paternal great aunt (PGF's sister) dx. breast cancer, 78s  . Colon cancer Neg Hx   . Rectal cancer Neg Hx   . Stomach cancer Neg Hx   . Esophageal cancer Neg Hx     Past Surgical History:  Procedure Laterality Date  . ABDOMINAL HYSTERECTOMY    . adiana    . BILATERAL SALPINGECTOMY Bilateral 05/31/2013   Procedure: BILATERAL SALPINGECTOMY;  Surgeon: Cheri Fowler, MD;  Location: [facility];  Service: Gynecology;  Laterality: Bilateral;  . BREAST CYST ASPIRATION  2015  .  COLONOSCOPY    . DILATION AND CURETTAGE OF UTERUS    . DNC    . KIDNEY STONE SURGERY    . KNEE ARTHROSCOPY Right   . KNEE ARTHROSCOPY WITH DRILLING/MICROFRACTURE Right 07/26/2014   Procedure: RIGHT KNEE ARTHROSCOPY WITH CHONDROPLASTY, MICROFRACTURE;  Surgeon: Marianna Payment, MD;  Location: [facility];  Service: Orthopedics;  Laterality: Right;  . KNEE ARTHROSCOPY WITH MEDIAL MENISECTOMY Right 07/26/2014   Procedure: KNEE ARTHROSCOPY WITH MEDIAL MENISECTOMY;  Surgeon: Marianna Payment, MD;  Location: [facility];  Service: Orthopedics;  Laterality: Right;  . LAPAROSCOPIC ASSISTED VAGINAL HYSTERECTOMY N/A 05/31/2013   Procedure: LAPAROSCOPIC ASSISTED VAGINAL HYSTERECTOMY;  Surgeon: Cheri Fowler, MD;  Location: [facility];  Service: Gynecology;  Laterality: N/A;  . PARTIAL HYSTERECTOMY    . TONSILLECTOMY    . TONSILLECTOMY AND ADENOIDECTOMY    . TUBAL LIGATION     Social History   Occupational History  . Not on file  Tobacco Use  . Smoking status: Never Smoker  . Smokeless tobacco: Never Used  Substance and Sexual Activity  . Alcohol use: Yes    Alcohol/week: 0.0 oz    Comment: couple times per month  . Drug use: No  . Sexual activity: Not on file

## 2017-08-13 ENCOUNTER — Encounter (HOSPITAL_COMMUNITY): Payer: Self-pay | Admitting: *Deleted

## 2017-08-13 ENCOUNTER — Other Ambulatory Visit: Payer: Self-pay

## 2017-08-25 NOTE — H&P (Signed)
[name] [name] is an 37 y.o. female. s/p LAVH/bilateral salpingectomy/FOE in 2014 for pain and endometriosis. Having increased pelvic pressure and pain. Has had pain with coitus as well. No F/C, some nausea but having GI issues-had recent endoscopy.  Normal urination, no change in BM. Pelvic ultrasound recently is normal with CL on right.  She wants to avoid any hormonal treatment if possible due to FH breast cancer.  Pertinent Gynecological History: OB History: G4, P2114   Menstrual History: Patient's last menstrual period was 05/09/2013.    Past Medical History:  Diagnosis Date  . Allergy   . Arthritis    right knee  . Asthma   . Constipation   . Depression   . GERD (gastroesophageal reflux disease)   . History of kidney stones   . Panic disorder   . Seasonal allergies   . SVD (spontaneous vaginal delivery)    x 3    Past Surgical History:  Procedure Laterality Date  . ABDOMINAL HYSTERECTOMY    . adiana    . BILATERAL SALPINGECTOMY Bilateral 05/31/2013   Procedure: BILATERAL SALPINGECTOMY;  Surgeon: [name] Fowler, MD;  Location: [facility];  Service: Gynecology;  Laterality: Bilateral;  . BREAST CYST ASPIRATION Right 2015  . COLONOSCOPY    . DILATION AND CURETTAGE OF UTERUS    . DNC    . KIDNEY STONE SURGERY    . KNEE ARTHROSCOPY Right   . KNEE ARTHROSCOPY WITH DRILLING/MICROFRACTURE Right 07/26/2014   Procedure: RIGHT KNEE ARTHROSCOPY WITH CHONDROPLASTY, MICROFRACTURE;  Surgeon: [name] Payment, MD;  Location: [facility];  Service: Orthopedics;  Laterality: Right;  . KNEE ARTHROSCOPY WITH MEDIAL MENISECTOMY Right 07/26/2014   Procedure: KNEE ARTHROSCOPY WITH MEDIAL MENISECTOMY;  Surgeon: [name] Payment, MD;  Location: [facility];  Service: Orthopedics;  Laterality: Right;  . LAPAROSCOPIC ASSISTED VAGINAL HYSTERECTOMY N/A 05/31/2013   Procedure: LAPAROSCOPIC ASSISTED VAGINAL HYSTERECTOMY;  Surgeon: [name] Fowler, MD;  Location: [facility];   Service: Gynecology;  Laterality: N/A;  . PARTIAL HYSTERECTOMY    . TONSILLECTOMY    . TONSILLECTOMY AND ADENOIDECTOMY    . TUBAL LIGATION    . UPPER GI ENDOSCOPY     ulcer    Family History  Problem Relation Age of Onset  . Hypertension Mother   . Diabetes Mother   . Breast cancer Mother 46       IDC; s/p lumpectomy, radiation, and arimidex  . Prostate cancer Paternal Grandfather        dx. 50s-60s  . Other Sister        both full sisters also have dense breast tissue  . Breast cancer Maternal Grandmother 83       IDC  . Colon polyps Maternal Grandmother        approx 4-5 colon polyps  . Pancreatic cancer Other        maternal great aunt (MGF's sister) d. pancreatic cancer  . Cancer Other        maternal great aunt (MGF's sister) dx NOS cancer  . Cancer Other        maternal great grandmother (MGF's mother) d. NOS cancer in her 32s  . Cancer Cousin 25       paternal 1st cousin dx. w/ either throat or thyroid cancer; not a smoker  . Breast cancer Other        paternal great aunt (PGF's sister) dx. breast cancer, 27s  . Colon cancer Neg Hx   . Rectal cancer Neg Hx   .  Stomach cancer Neg Hx   . Esophageal cancer Neg Hx     Social History:  reports that  has never smoked. she has never used smokeless tobacco. She reports that she does not drink alcohol or use drugs.  Allergies:  Allergies  Allergen Reactions  . Morphine And Related Nausea And Vomiting  . Nitrofurantoin Monohyd Macro Hives and Other (See Comments)    Respiratory distress    No medications prior to admission.    Review of Systems  Respiratory: Negative.   Cardiovascular: Negative.     Height 5\' 2"  (1.575 m), weight 143 lb (64.9 kg), last menstrual period 05/09/2013. Physical Exam  Constitutional: She appears well-developed and well-nourished.  Neck: Normal range of motion. Neck supple. No thyromegaly present.  Cardiovascular: Normal rate, regular rhythm and normal heart sounds.  No murmur  heard. Respiratory: Breath sounds normal. No respiratory distress. She has no wheezes.  GI: Soft. She exhibits no distension and no mass. There is no tenderness. There is no rebound.  Genitourinary: Vagina normal and uterus normal.  Genitourinary Comments: Mild bilateral adnexal tenderness, no mass    No results found for this or any previous visit (from the past 24 hour(s)).  No results found.  Assessment/Plan: Chronic pelvic pain, no obvious source on exam or ultrasound.  Discussed medical and surgical options, she desires laparoscopy.  Surgical procedure and risks discussed, as well as chances of relieving her pain /normal findings.  Will admit for diagnostic/possible operative laparoscopy.  [name] 08/25/2017, 8:42 AM

## 2017-08-26 ENCOUNTER — Ambulatory Visit (HOSPITAL_COMMUNITY)
Admission: RE | Admit: 2017-08-26 | Discharge: 2017-08-26 | Disposition: A | Discharge status: Home / Self Care | Payer: Medicaid Other | Source: Ambulatory Visit | Attending: Obstetrics and Gynecology | Admitting: Obstetrics and Gynecology

## 2017-08-26 ENCOUNTER — Ambulatory Visit (HOSPITAL_COMMUNITY): Payer: Medicaid Other | Admitting: Anesthesiology

## 2017-08-26 ENCOUNTER — Other Ambulatory Visit: Payer: Self-pay

## 2017-08-26 ENCOUNTER — Encounter (HOSPITAL_COMMUNITY): Payer: Self-pay | Admitting: *Deleted

## 2017-08-26 ENCOUNTER — Encounter (HOSPITAL_COMMUNITY)
Admission: RE | Disposition: A | Discharge status: Home / Self Care | Payer: Self-pay | Source: Ambulatory Visit | Attending: Obstetrics and Gynecology

## 2017-08-26 DIAGNOSIS — N83202 Unspecified ovarian cyst, left side: Secondary | ICD-10-CM | POA: Insufficient documentation

## 2017-08-26 DIAGNOSIS — R102 Pelvic and perineal pain: Secondary | ICD-10-CM | POA: Insufficient documentation

## 2017-08-26 DIAGNOSIS — F329 Major depressive disorder, single episode, unspecified: Secondary | ICD-10-CM | POA: Insufficient documentation

## 2017-08-26 DIAGNOSIS — IMO0002 Reserved for concepts with insufficient information to code with codable children: Secondary | ICD-10-CM | POA: Diagnosis present

## 2017-08-26 DIAGNOSIS — N736 Female pelvic peritoneal adhesions (postinfective): Secondary | ICD-10-CM | POA: Diagnosis present

## 2017-08-26 DIAGNOSIS — F410 Panic disorder [episodic paroxysmal anxiety] without agoraphobia: Secondary | ICD-10-CM | POA: Insufficient documentation

## 2017-08-26 DIAGNOSIS — M1711 Unilateral primary osteoarthritis, right knee: Secondary | ICD-10-CM | POA: Diagnosis not present

## 2017-08-26 DIAGNOSIS — J45909 Unspecified asthma, uncomplicated: Secondary | ICD-10-CM | POA: Insufficient documentation

## 2017-08-26 DIAGNOSIS — Z885 Allergy status to narcotic agent status: Secondary | ICD-10-CM | POA: Diagnosis not present

## 2017-08-26 DIAGNOSIS — Z79899 Other long term (current) drug therapy: Secondary | ICD-10-CM | POA: Insufficient documentation

## 2017-08-26 DIAGNOSIS — K219 Gastro-esophageal reflux disease without esophagitis: Secondary | ICD-10-CM | POA: Insufficient documentation

## 2017-08-26 DIAGNOSIS — Z881 Allergy status to other antibiotic agents status: Secondary | ICD-10-CM | POA: Diagnosis not present

## 2017-08-26 DIAGNOSIS — Z803 Family history of malignant neoplasm of breast: Secondary | ICD-10-CM | POA: Diagnosis not present

## 2017-08-26 DIAGNOSIS — G8929 Other chronic pain: Secondary | ICD-10-CM | POA: Insufficient documentation

## 2017-08-26 DIAGNOSIS — N803 Endometriosis of pelvic peritoneum: Secondary | ICD-10-CM | POA: Diagnosis not present

## 2017-08-26 HISTORY — DX: Other seasonal allergic rhinitis: J30.2

## 2017-08-26 HISTORY — PX: LAPAROSCOPY: SHX197

## 2017-08-26 HISTORY — DX: Unspecified osteoarthritis, unspecified site: M19.90

## 2017-08-26 HISTORY — DX: Personal history of urinary calculi: Z87.442

## 2017-08-26 SURGERY — LAPAROSCOPY OPERATIVE
Anesthesia: General | Site: Abdomen

## 2017-08-26 MED ORDER — SUGAMMADEX SODIUM 200 MG/2ML IV SOLN
INTRAVENOUS | Status: AC
  Filled 2017-08-26: qty 2

## 2017-08-26 MED ORDER — GLYCOPYRROLATE 0.2 MG/ML IJ SOLN
INTRAMUSCULAR | Status: AC
  Filled 2017-08-26: qty 1

## 2017-08-26 MED ORDER — LACTATED RINGERS IV SOLN: INTRAVENOUS | Status: DC

## 2017-08-26 MED ORDER — BUPIVACAINE HCL (PF) 0.25 % IJ SOLN
INTRAMUSCULAR | Status: DC | PRN
  Administered 2017-08-26: 10 mL

## 2017-08-26 MED ORDER — ONDANSETRON HCL 4 MG/2ML IJ SOLN
INTRAMUSCULAR | Status: AC
  Filled 2017-08-26: qty 2

## 2017-08-26 MED ORDER — SUGAMMADEX SODIUM 200 MG/2ML IV SOLN
INTRAVENOUS | Status: DC | PRN
  Administered 2017-08-26: 125 mg via INTRAVENOUS

## 2017-08-26 MED ORDER — MIDAZOLAM HCL 2 MG/2ML IJ SOLN
INTRAMUSCULAR | Status: DC | PRN
  Administered 2017-08-26: 2 mg via INTRAVENOUS

## 2017-08-26 MED ORDER — SCOPOLAMINE 1 MG/3DAYS TD PT72
MEDICATED_PATCH | TRANSDERMAL | Status: AC
  Filled 2017-08-26: qty 1

## 2017-08-26 MED ORDER — FENTANYL CITRATE (PF) 250 MCG/5ML IJ SOLN
INTRAMUSCULAR | Status: AC
  Filled 2017-08-26: qty 5

## 2017-08-26 MED ORDER — LACTATED RINGERS IV SOLN
INTRAVENOUS | Status: DC
  Administered 2017-08-26: 13:00:00 via INTRAVENOUS

## 2017-08-26 MED ORDER — SODIUM CHLORIDE 0.9 % IJ SOLN
INTRAMUSCULAR | Status: DC | PRN
  Administered 2017-08-26: 10 mL

## 2017-08-26 MED ORDER — PROPOFOL 10 MG/ML IV BOLUS
INTRAVENOUS | Status: DC | PRN
  Administered 2017-08-26: 200 mg via INTRAVENOUS

## 2017-08-26 MED ORDER — HYDROMORPHONE HCL 1 MG/ML IJ SOLN
INTRAMUSCULAR | Status: AC
  Filled 2017-08-26: qty 0.5

## 2017-08-26 MED ORDER — ONDANSETRON HCL 4 MG/2ML IJ SOLN
INTRAMUSCULAR | Status: DC | PRN
  Administered 2017-08-26: 4 mg via INTRAVENOUS

## 2017-08-26 MED ORDER — MEPERIDINE HCL 25 MG/ML IJ SOLN
INTRAMUSCULAR | Status: AC
  Administered 2017-08-26: 12.5 mg via INTRAVENOUS
  Filled 2017-08-26: qty 1

## 2017-08-26 MED ORDER — HYDROCODONE-ACETAMINOPHEN 5-325 MG PO TABS
1.0000 | ORAL_TABLET | Freq: Four times a day (QID) | ORAL | 0 refills | Status: DC | PRN
Start: 2017-08-26 — End: 2018-05-09

## 2017-08-26 MED ORDER — DEXAMETHASONE SODIUM PHOSPHATE 4 MG/ML IJ SOLN
INTRAMUSCULAR | Status: AC
  Filled 2017-08-26: qty 1

## 2017-08-26 MED ORDER — ROCURONIUM BROMIDE 100 MG/10ML IV SOLN
INTRAVENOUS | Status: DC | PRN
  Administered 2017-08-26: 30 mg via INTRAVENOUS

## 2017-08-26 MED ORDER — LACTATED RINGERS IV SOLN
INTRAVENOUS | Status: DC | PRN
  Administered 2017-08-26 (×2): via INTRAVENOUS

## 2017-08-26 MED ORDER — FENTANYL CITRATE (PF) 100 MCG/2ML IJ SOLN
INTRAMUSCULAR | Status: DC | PRN
  Administered 2017-08-26: 50 ug via INTRAVENOUS
  Administered 2017-08-26: 100 ug via INTRAVENOUS
  Administered 2017-08-26 (×2): 50 ug via INTRAVENOUS

## 2017-08-26 MED ORDER — MIDAZOLAM HCL 2 MG/2ML IJ SOLN
INTRAMUSCULAR | Status: AC
  Filled 2017-08-26: qty 2

## 2017-08-26 MED ORDER — MEPERIDINE HCL 25 MG/ML IJ SOLN
12.5000 mg | Freq: Once | INTRAMUSCULAR | Status: AC
  Administered 2017-08-26: 12.5 mg via INTRAVENOUS

## 2017-08-26 MED ORDER — HYDROMORPHONE HCL 1 MG/ML IJ SOLN
0.2500 mg | INTRAMUSCULAR | Status: DC | PRN
  Administered 2017-08-26 (×2): 0.25 mg via INTRAVENOUS

## 2017-08-26 MED ORDER — SODIUM CHLORIDE 0.9% FLUSH
INTRAVENOUS | Status: AC
  Filled 2017-08-26: qty 3

## 2017-08-26 MED ORDER — KETOROLAC TROMETHAMINE 30 MG/ML IJ SOLN
INTRAMUSCULAR | Status: AC
Start: 2017-08-26 — End: 2017-08-26
  Filled 2017-08-26: qty 1

## 2017-08-26 MED ORDER — LIDOCAINE HCL (CARDIAC) 20 MG/ML IV SOLN
INTRAVENOUS | Status: DC | PRN
  Administered 2017-08-26: 50 mg via INTRAVENOUS

## 2017-08-26 MED ORDER — PROPOFOL 10 MG/ML IV BOLUS
INTRAVENOUS | Status: AC
  Filled 2017-08-26: qty 20

## 2017-08-26 MED ORDER — KETOROLAC TROMETHAMINE 30 MG/ML IJ SOLN
INTRAMUSCULAR | Status: DC | PRN
  Administered 2017-08-26: 30 mg via INTRAVENOUS

## 2017-08-26 MED ORDER — SODIUM CHLORIDE 0.9 % IJ SOLN
INTRAMUSCULAR | Status: AC
  Filled 2017-08-26: qty 10

## 2017-08-26 MED ORDER — HYDROMORPHONE HCL 1 MG/ML IJ SOLN
INTRAMUSCULAR | Status: AC
  Administered 2017-08-26: 0.25 mg via INTRAVENOUS
  Filled 2017-08-26: qty 0.5

## 2017-08-26 MED ORDER — DEXAMETHASONE SODIUM PHOSPHATE 10 MG/ML IJ SOLN
INTRAMUSCULAR | Status: DC | PRN
  Administered 2017-08-26: 4 mg via INTRAVENOUS

## 2017-08-26 MED ORDER — PROMETHAZINE HCL 25 MG/ML IJ SOLN
INTRAMUSCULAR | Status: AC
  Administered 2017-08-26: 6.25 mg via INTRAVENOUS
  Filled 2017-08-26: qty 1

## 2017-08-26 MED ORDER — BUPIVACAINE HCL (PF) 0.25 % IJ SOLN
INTRAMUSCULAR | Status: AC
  Filled 2017-08-26: qty 30

## 2017-08-26 MED ORDER — SCOPOLAMINE 1 MG/3DAYS TD PT72
1.0000 | MEDICATED_PATCH | Freq: Once | TRANSDERMAL | Status: DC
  Administered 2017-08-26: 1.5 mg via TRANSDERMAL

## 2017-08-26 MED ORDER — PROMETHAZINE HCL 25 MG/ML IJ SOLN
6.2500 mg | INTRAMUSCULAR | Status: DC | PRN
  Administered 2017-08-26: 6.25 mg via INTRAVENOUS

## 2017-08-26 MED ORDER — LIDOCAINE HCL (PF) 1 % IJ SOLN
INTRAMUSCULAR | Status: AC
  Filled 2017-08-26: qty 5

## 2017-08-26 MED ORDER — GLYCOPYRROLATE 0.2 MG/ML IJ SOLN
INTRAMUSCULAR | Status: DC | PRN
  Administered 2017-08-26: 0.1 mg via INTRAVENOUS

## 2017-08-26 SURGICAL SUPPLY — 35 items
CABLE HIGH FREQUENCY MONO STRZ (ELECTRODE) IMPLANT
CATH ROBINSON RED A/P 16FR (CATHETERS) IMPLANT
DECANTER SPIKE VIAL GLASS SM (MISCELLANEOUS) ×4 IMPLANT
DERMABOND ADVANCED (GAUZE/BANDAGES/DRESSINGS) ×2
DERMABOND ADVANCED .7 DNX12 (GAUZE/BANDAGES/DRESSINGS) ×2 IMPLANT
DRSG OPSITE POSTOP 3X4 (GAUZE/BANDAGES/DRESSINGS) IMPLANT
DURAPREP 26ML APPLICATOR (WOUND CARE) ×4 IMPLANT
GLOVE BIO SURGEON STRL SZ8 (GLOVE) ×4 IMPLANT
GLOVE BIOGEL PI IND STRL 7.0 (GLOVE) ×4 IMPLANT
GLOVE BIOGEL PI INDICATOR 7.0 (GLOVE) ×4
GLOVE ORTHO TXT STRL SZ7.5 (GLOVE) ×4 IMPLANT
GOWN STRL REUS W/TWL 2XL LVL3 (GOWN DISPOSABLE) ×4 IMPLANT
GOWN STRL REUS W/TWL LRG LVL3 (GOWN DISPOSABLE) ×8 IMPLANT
NEEDLE EPID 17G 5 ECHO TUOHY (NEEDLE) IMPLANT
NEEDLE INSUFFLATION 120MM (ENDOMECHANICALS) ×4 IMPLANT
NS IRRIG 1000ML POUR BTL (IV SOLUTION) ×4 IMPLANT
PACK LAPAROSCOPY BASIN (CUSTOM PROCEDURE TRAY) ×4 IMPLANT
PACK TRENDGUARD 450 HYBRID PRO (MISCELLANEOUS) ×2 IMPLANT
PACK TRENDGUARD 600 HYBRD PROC (MISCELLANEOUS) IMPLANT
POUCH SPECIMEN RETRIEVAL 10MM (ENDOMECHANICALS) IMPLANT
PROTECTOR NERVE ULNAR (MISCELLANEOUS) ×8 IMPLANT
SET IRRIG TUBING LAPAROSCOPIC (IRRIGATION / IRRIGATOR) ×4 IMPLANT
SHEARS HARMONIC ACE PLUS 36CM (ENDOMECHANICALS) ×4 IMPLANT
SLEEVE XCEL OPT CAN 5 100 (ENDOMECHANICALS) ×8 IMPLANT
SOLUTION ELECTROLUBE (MISCELLANEOUS) IMPLANT
SUT VICRYL 0 UR6 27IN ABS (SUTURE) IMPLANT
SUT VICRYL 4-0 PS2 18IN ABS (SUTURE) ×4 IMPLANT
TOWEL OR 17X24 6PK STRL BLUE (TOWEL DISPOSABLE) ×8 IMPLANT
TRAY FOLEY CATH SILVER 14FR (SET/KITS/TRAYS/PACK) ×4 IMPLANT
TRENDGUARD 450 HYBRID PRO PACK (MISCELLANEOUS) ×4
TRENDGUARD 600 HYBRID PROC PK (MISCELLANEOUS)
TROCAR BALLN 12MMX100 BLUNT (TROCAR) IMPLANT
TROCAR XCEL NON-BLD 11X100MML (ENDOMECHANICALS) IMPLANT
TROCAR XCEL NON-BLD 5MMX100MML (ENDOMECHANICALS) ×4 IMPLANT
WARMER LAPAROSCOPE (MISCELLANEOUS) ×4 IMPLANT

## 2017-08-26 NOTE — Transfer of Care (Signed)
Immediate Anesthesia Transfer of Care Note  Patient: [name] [name]  Procedure(s) Performed: LAPAROSCOPY OPERATIVE WITH PERIOTONEAL BIOPSY AND FULGERATION OF ENDOMETRIOSIS (N/A Abdomen)  Patient Location: PACU  Anesthesia Type:General  Level of Consciousness: awake, alert  and oriented  Airway & Oxygen Therapy: Patient Spontanous Breathing and Patient connected to nasal cannula oxygen  Post-op Assessment: Report given to RN and Post -op Vital signs reviewed and stable  Post vital signs: Reviewed and stable  Last Vitals:  Vitals:   08/26/17 0927  BP: 121/77  Pulse: 66  Temp: 36.8 C  SpO2: 100%    Last Pain:  Vitals:   08/26/17 0927  TempSrc: Oral  PainSc: 5       Patients Stated Pain Goal: 3 (21/30/86 5784)  Complications: No apparent anesthesia complications

## 2017-08-26 NOTE — Anesthesia Postprocedure Evaluation (Signed)
Anesthesia Post Note  Patient: [name] [name]  Procedure(s) Performed: LAPAROSCOPY OPERATIVE WITH PERIOTONEAL BIOPSY AND FULGERATION OF ENDOMETRIOSIS (N/A Abdomen)     Patient location during evaluation: PACU Anesthesia Type: General Level of consciousness: awake and alert Pain management: pain level controlled Vital Signs Assessment: post-procedure vital signs reviewed and stable Respiratory status: spontaneous breathing, nonlabored ventilation, respiratory function stable and patient connected to nasal cannula oxygen Cardiovascular status: blood pressure returned to baseline and stable Postop Assessment: no apparent nausea or vomiting Anesthetic complications: no    Last Vitals:  Vitals:   08/26/17 1245 08/26/17 1322  BP:  122/79  Pulse:  92  Resp:  16  Temp:    SpO2: 98% 99%    Last Pain:  Vitals:   08/26/17 1322  TempSrc:   PainSc: 3    Pain Goal: Patients Stated Pain Goal: 3 (08/26/17 1215)               Thurmond Butts P Ellwood Steidle

## 2017-08-26 NOTE — Anesthesia Procedure Notes (Signed)
Procedure Name: Intubation Date/Time: 08/26/2017 9:57 AM Performed by: Bufford Spikes, CRNA Pre-anesthesia Checklist: Patient identified, Emergency Drugs available, Suction available and Patient being monitored Patient Re-evaluated:Patient Re-evaluated prior to induction Oxygen Delivery Method: Circle system utilized Preoxygenation: Pre-oxygenation with 100% oxygen Induction Type: IV induction Ventilation: Mask ventilation without difficulty Laryngoscope Size: Miller and 2 Tube type: Oral Tube size: 7.0 mm Number of attempts: 1 Airway Equipment and Method: Stylet Placement Confirmation: ETT inserted through vocal cords under direct vision,  positive ETCO2 and breath sounds checked- equal and bilateral Secured at: 21 cm Tube secured with: Tape Dental Injury: Teeth and Oropharynx as per pre-operative assessment

## 2017-08-26 NOTE — Op Note (Signed)
Preoperative diagnosis: Pelvic pain  Postoperative diagnosis: Pelvic pain, pelvic adhesions, pelvic endometriosis Procedure: Laparoscopy with adhesiolysis, peritoneal biopsy, fulguration on endometriosis Surgeon: Cheri Fowler M.D.  Anesthesia: Gen. Endotracheal tube  Findings: She had a normal upper abdomen, previous hysterectomy, right ovary adherent to the right pelvic sidewall.  Left ovary had a large hemorrhagic cyst that was drained.  They were dark implants at the vaginal cuff and left pelvis consistent with endometriosis.  There were adhesions of the bowel also to the vaginal cuff Specimens: Peritoneal biopsy Estimated blood loss: Minimal  Complications: None  Procedure in detail:  The patient was taken to the operating room and placed in the dorsosupine position. General anesthesia was induced. Her legs were placed in mobile stirrups and her left arm was tucked to her side. Abdomen perineum and vagina were then prepped and draped in the usual sterile fashion, bladder drained with a Foley catheter. Infraumbilical skin was then infiltrated with quarter percent Marcaine and a 1 cm vertical incision was made. The veress needle was inserted into the peritoneal cavity and placement confirmed by the water drop test and an opening pressure of 2 mm of mercury. CO2 was insufflated to a pressure of 12 mm of mercury and the veress needle was removed. A 59mm disposable trocar was then introduced with direct visualization with the laparoscope. A 5 mm port was then placed on each side also under direct visualization. Careful and thorough inspection revealed the above-mentioned findings.  The adhesions of the bowel to the vaginal cuff were able to be taken down with scissors.  One area that bled was made hemostatic with Kleppingers.  This freed the bowel enough for better inspection.  The right ovary was significantly adherent to the right pelvic sidewall.  This was taken down carefully with Harmonic scalpel.   The ovary was completely freed.  Inferior to where the ovary was stuck there were areas of dark implants consistent with endometriosis.  This area was grasped and elevated.  The harmonic scalpel was used to undermine a 2-3 cm area peritoneum and this was removed with the harmonic scalpel.  This removed this entire area of endometriosis.  This appeared to be inferior to the ureter.  3 or 4 other small areas that appeared to be endometriosis were fulgurated with bipolar cautery.  The pelvis was irrigated and all areas were found to be hemostatic.  No other areas of endometriosis or significant adhesions remained.  Both lateral trochars were removed under direct visualization.  All gas was allowed to deflate from the abdomen and the umbilical trocar was removed. Skin incisions were then closed with interrupted subcuticular sutures of 4-0 Vicryl followed by Dermabond. The Foley catheter was removed in the operating room.  The patient was taken down from stirrups. She was awakened in the operating room and taken to the recovery room in stable condition after tolerating the procedure well. Counts were correct and she had PAS hose on throughout the procedure.

## 2017-08-26 NOTE — Discharge Instructions (Signed)
DISCHARGE INSTRUCTIONS: Laparoscopy  The following instructions have been prepared to help you care for yourself upon your return home today.  Wound care:  Do not get the incision wet for the first 24 hours. The incision should be kept clean and dry.  The Band-Aids or dressings may be removed the day after surgery.  Should the incision become sore, red, and swollen after the first week, check with your doctor.  Personal hygiene:  Shower the day after your procedure.  Activity and limitations:  Do NOT drive or operate any equipment today.  Do NOT lift anything more than 15 pounds for 2-3 weeks after surgery.  Do NOT rest in bed all day.  Walking is encouraged. Walk each day, starting slowly with 5-minute walks 3 or 4 times a day. Slowly increase the length of your walks.  Walk up and down stairs slowly.  Do NOT do strenuous activities, such as golfing, playing tennis, bowling, running, biking, weight lifting, gardening, mowing, or vacuuming for 2-4 weeks. Ask your doctor when it is okay to start.  Diet: Eat a light meal as desired this evening. You may resume your usual diet tomorrow.  Return to work: This is dependent on the type of work you do. For the most part you can return to a desk job within a week of surgery. If you are more active at work, please discuss this with your doctor.  What to expect after your surgery: You may have a slight burning sensation when you urinate on the first day. You may have a very small amount of blood in the urine. Expect to have a small amount of vaginal discharge/light bleeding for 1-2 weeks. It is not unusual to have abdominal soreness and bruising for up to 2 weeks. You may be tired and need more rest for about 1 week. You may experience shoulder pain for 24-72 hours. Lying flat in bed may relieve it.  Call your doctor for any of the following:  Develop a fever of 100.4 or greater  Inability to urinate 6 hours after discharge from  hospital  Severe pain not relieved by pain medications  Persistent of heavy bleeding at incision site  Redness or swelling around incision site after a week  Increasing nausea or vomiting  Special Instructions/Symptoms: Your throat may feel dry or sore from the anesthesia or the breathing tube placed in your throat during surgery. If this causes discomfort, gargle with warm salt water. The discomfort should disappear within 24 hours.  If you had a scopolamine patch placed behind your ear for the management of post- operative nausea and/or vomiting:  1. The medication in the patch is effective for 72 hours, after which it should be removed.  Wrap patch in a tissue and discard in the trash. Wash hands thoroughly with soap and water. 2. You may remove the patch earlier than 72 hours if you experience unpleasant side effects which may include dry mouth, dizziness or visual disturbances. 3. Avoid touching the patch. Wash your hands with soap and water after contact with the patch.

## 2017-08-26 NOTE — Interval H&P Note (Signed)
History and Physical Interval Note:  08/26/2017 9:40 AM  [name] [name]  has presented today for surgery, with the diagnosis of pelvic pain  The various methods of treatment have been discussed with the patient and family. After consideration of risks, benefits and other options for treatment, the patient has consented to  Procedure(s): LAPAROSCOPY DIAGNOSTIC (N/A) LAPAROSCOPY OPERATIVE (N/A) as a surgical intervention .  The patient's history has been reviewed, patient examined, no change in status, stable for surgery.  I have reviewed the patient's chart and labs.  Questions were answered to the patient's satisfaction.     Blane Ohara Latifa Noble

## 2017-08-26 NOTE — Anesthesia Preprocedure Evaluation (Addendum)
Anesthesia Evaluation  Patient identified by MRN, date of birth, ID band Patient awake    Reviewed: Allergy & Precautions, NPO status , Patient's Chart, lab work & pertinent test results  Airway Mallampati: I  TM Distance: >3 FB Neck ROM: Full    Dental  (+) Teeth Intact, Dental Advisory Given   Pulmonary asthma (mild, controlled) ,    breath sounds clear to auscultation       Cardiovascular negative cardio ROS   Rhythm:Regular Rate:Normal     Neuro/Psych PSYCHIATRIC DISORDERS Anxiety Depression negative neurological ROS     GI/Hepatic Neg liver ROS, GERD  Medicated and Controlled,  Endo/Other  negative endocrine ROS  Renal/GU Renal diseaseStones     Musculoskeletal   Abdominal   Peds  Hematology   Anesthesia Other Findings pelvic pain  Reproductive/Obstetrics S/p TUBAL LIGATION                            Anesthesia Physical  Anesthesia Plan  ASA: II  Anesthesia Plan: General   Post-op Pain Management:    Induction: Intravenous  PONV Risk Score and Plan: 4 or greater and Scopolamine patch - Pre-op, Dexamethasone, Ondansetron and Treatment may vary due to age or medical condition  Airway Management Planned: Oral ETT  Additional Equipment:   Intra-op Plan:   Post-operative Plan: Extubation in OR  Informed Consent: I have reviewed the patients History and Physical, chart, labs and discussed the procedure including the risks, benefits and alternatives for the proposed anesthesia with the patient or authorized representative who has indicated his/her understanding and acceptance.   Dental advisory given  Plan Discussed with: CRNA  Anesthesia Plan Comments:         Anesthesia Quick Evaluation

## 2017-08-27 ENCOUNTER — Encounter (HOSPITAL_COMMUNITY): Payer: Self-pay | Admitting: Obstetrics and Gynecology

## 2017-09-03 ENCOUNTER — Other Ambulatory Visit (HOSPITAL_COMMUNITY): Payer: Self-pay | Admitting: Gastroenterology

## 2017-09-03 DIAGNOSIS — R1084 Generalized abdominal pain: Secondary | ICD-10-CM

## 2017-09-04 ENCOUNTER — Ambulatory Visit (HOSPITAL_COMMUNITY)
Admission: RE | Admit: 2017-09-04 | Discharge: 2017-09-04 | Disposition: A | Discharge status: Home / Self Care | Payer: Medicaid Other | Source: Ambulatory Visit | Attending: Gastroenterology | Admitting: Gastroenterology

## 2017-09-04 DIAGNOSIS — R1084 Generalized abdominal pain: Secondary | ICD-10-CM

## 2017-09-04 MED ORDER — TECHNETIUM TC 99M MEBROFENIN IV KIT
5.2000 | PACK | Freq: Once | INTRAVENOUS | Status: AC | PRN
  Administered 2017-09-04: 5.2 via INTRAVENOUS

## 2017-10-29 ENCOUNTER — Ambulatory Visit (INDEPENDENT_AMBULATORY_CARE_PROVIDER_SITE_OTHER): Payer: Medicaid Other

## 2017-10-29 ENCOUNTER — Telehealth (INDEPENDENT_AMBULATORY_CARE_PROVIDER_SITE_OTHER): Payer: Self-pay

## 2017-10-29 ENCOUNTER — Ambulatory Visit (INDEPENDENT_AMBULATORY_CARE_PROVIDER_SITE_OTHER): Payer: Medicaid Other | Admitting: Physician Assistant

## 2017-10-29 ENCOUNTER — Encounter (INDEPENDENT_AMBULATORY_CARE_PROVIDER_SITE_OTHER): Payer: Self-pay | Admitting: Physician Assistant

## 2017-10-29 DIAGNOSIS — M25562 Pain in left knee: Secondary | ICD-10-CM

## 2017-10-29 DIAGNOSIS — M25561 Pain in right knee: Secondary | ICD-10-CM | POA: Diagnosis not present

## 2017-10-29 DIAGNOSIS — G8929 Other chronic pain: Secondary | ICD-10-CM | POA: Diagnosis not present

## 2017-10-29 MED ORDER — BUPIVACAINE HCL 0.25 % IJ SOLN
0.6600 mL | INTRAMUSCULAR | Status: AC | PRN
  Administered 2017-10-29: .66 mL via INTRA_ARTICULAR

## 2017-10-29 MED ORDER — DICLOFENAC SODIUM 1 % TD GEL: 2.0000 g | Freq: Four times a day (QID) | TRANSDERMAL | Status: DC | PRN

## 2017-10-29 MED ORDER — LIDOCAINE HCL 1 % IJ SOLN
3.0000 mL | INTRAMUSCULAR | Status: AC | PRN
  Administered 2017-10-29: 3 mL

## 2017-10-29 MED ORDER — METHYLPREDNISOLONE ACETATE 40 MG/ML IJ SUSP
13.3300 mg | INTRAMUSCULAR | Status: AC | PRN
  Administered 2017-10-29: 13.33 mg via INTRA_ARTICULAR

## 2017-10-29 NOTE — Progress Notes (Signed)
Office Visit Note   Patient: [name] [name]           Date of Birth: [birth year]           MRN: [id number] Visit Date: 10/29/2017              Requested by: [facility] [address] [place], [place] [address] PCP: [place]: Visit Diagnoses:  1. Chronic pain of both knees     Plan: Impression is left knee medial meniscus tear with underlying osteoarthritis.  Right knee osteoarthritis.  I would really like to get an MRI of the left knee to assess her medial meniscus as well as proceed with right knee Visco supplementation injections however due to her insurance and lack of financial support she would like to proceed with bilateral cortisone injections.  We will try and get financial support to proceed with a right knee Visco supplementation injection.  She will follow-up with [place] on an as-needed basis.  Follow-Up Instructions: Return if symptoms worsen or fail to improve.   Orders:  Orders Placed This Encounter  Procedures  . XR KNEE 3 VIEW RIGHT  . XR KNEE 3 VIEW LEFT   No orders of the defined types were placed in this encounter.     Procedures: Large Joint Inj: bilateral knee on 10/29/2017 2:56 PM Indications: pain Details: 22 G needle, anterolateral approach Medications (Right): 0.66 mL bupivacaine 0.25 %; 3 mL lidocaine 1 %; 13.33 mg methylPREDNISolone acetate 40 MG/ML Medications (Left): 0.66 mL bupivacaine 0.25 %; 3 mL lidocaine 1 %; 13.33 mg methylPREDNISolone acetate 40 MG/ML      Clinical Data: No additional findings.   Subjective: Chief Complaint  Patient presents with  . Right Knee - Pain  . Left Knee - Pain    HPI patient is a pleasant 37 year old female presents to our clinic today with recurrent bilateral knee pain.  History of osteoarthritis to both knees.  She has had both knees injected with cortisone as well as Toradol.  Her last injection to both knees was on 06/09/2017.  She states these only helped  for a few days.  Few weeks ago, she was playing soccer with her daughter when she kicked the ball with the right leg.  She had immediate pain to the left knee.  Since then she has had increased sharp shooting pain to the medial aspect of the left knee.  Worse with pivoting and squatting.  She is having rest pain and night pain as well to both knees.  She does have a history of right knee arthroscopic debridement medial meniscus as well as microfracture back in 2016.  No surgical intervention to the left knee.  Review of Systems as detailed in HPI.  All others reviewed and are negative.   Objective: Vital Signs: LMP 05/09/2013   Physical Exam well-developed and well-nourished female in no acute distress.  Alert and oriented x3.  Ortho Exam examination of both knees shows range of motion from 0 to 120 degrees.  No effusion on the right, trace effusion on the left.  Marked patellofemoral crepitus right greater than left.  No joint line tenderness on the right.  Marked tenderness  over the left medial meniscus with a positive medial McMurray.  Ligaments are stable.  She is neurovascularly intact distally.  Specialty Comments:  No specialty comments available.  Imaging: Xr Knee 3 View Left  Result Date: 10/29/2017 Minimal tricompartmental changes  Xr Knee 3 View  Right  Result Date: 10/29/2017 Right knee reveals moderate medial and patellofemoral joint space narrowing    PMFS History: Patient Active Problem List   Diagnosis Date Noted  . Pelvic pain in female 08/26/2017  . Pelvic peritoneal adhesions, female 08/26/2017  . Endometriosis of pelvis 08/26/2017  . Unilateral primary osteoarthritis, right knee 06/09/2017  . Unilateral primary osteoarthritis, left knee 06/09/2017  . Family history of breast cancer in mother 12/06/2015  . Menorrhagia 05/31/2013  . Dysmenorrhea 05/31/2013  . Dyspareunia 05/31/2013  . Knee pain 08/24/2012  . Asthma 08/05/2011   Past Medical History:    Diagnosis Date  . Allergy   . Arthritis    right knee  . Asthma   . Constipation   . Depression   . GERD (gastroesophageal reflux disease)   . History of kidney stones   . Panic disorder   . Seasonal allergies   . SVD (spontaneous vaginal delivery)    x 3    Family History  Problem Relation Age of Onset  . Hypertension Mother   . Diabetes Mother   . Breast cancer Mother 26       IDC; s/p lumpectomy, radiation, and arimidex  . Prostate cancer Paternal Grandfather        dx. 50s-60s  . Other Sister        both full sisters also have dense breast tissue  . Breast cancer Maternal Grandmother 83       IDC  . Colon polyps Maternal Grandmother        approx 4-5 colon polyps  . Pancreatic cancer Other        maternal great aunt (MGF's sister) d. pancreatic cancer  . Cancer Other        maternal great aunt (MGF's sister) dx NOS cancer  . Cancer Other        maternal great grandmother (MGF's mother) d. NOS cancer in her 64s  . Cancer Cousin 25       paternal 1st cousin dx. w/ either throat or thyroid cancer; not a smoker  . Breast cancer Other        paternal great aunt (PGF's sister) dx. breast cancer, 21s  . Colon cancer Neg Hx   . Rectal cancer Neg Hx   . Stomach cancer Neg Hx   . Esophageal cancer Neg Hx     Past Surgical History:  Procedure Laterality Date  . ABDOMINAL HYSTERECTOMY    . adiana    . BILATERAL SALPINGECTOMY Bilateral 05/31/2013   Procedure: BILATERAL SALPINGECTOMY;  Surgeon: Cheri Fowler, MD;  Location: [facility];  Service: Gynecology;  Laterality: Bilateral;  . BREAST CYST ASPIRATION Right 2015  . COLONOSCOPY    . DILATION AND CURETTAGE OF UTERUS    . DNC    . KIDNEY STONE SURGERY    . KNEE ARTHROSCOPY Right   . KNEE ARTHROSCOPY WITH DRILLING/MICROFRACTURE Right 07/26/2014   Procedure: RIGHT KNEE ARTHROSCOPY WITH CHONDROPLASTY, MICROFRACTURE;  Surgeon: Marianna Payment, MD;  Location: [facility];  Service: Orthopedics;  Laterality:  Right;  . KNEE ARTHROSCOPY WITH MEDIAL MENISECTOMY Right 07/26/2014   Procedure: KNEE ARTHROSCOPY WITH MEDIAL MENISECTOMY;  Surgeon: Marianna Payment, MD;  Location: [facility];  Service: Orthopedics;  Laterality: Right;  . LAPAROSCOPIC ASSISTED VAGINAL HYSTERECTOMY N/A 05/31/2013   Procedure: LAPAROSCOPIC ASSISTED VAGINAL HYSTERECTOMY;  Surgeon: Cheri Fowler, MD;  Location: [facility];  Service: Gynecology;  Laterality: N/A;  . LAPAROSCOPY N/A 08/26/2017   Procedure: LAPAROSCOPY OPERATIVE  WITH PERIOTONEAL BIOPSY AND FULGERATION OF ENDOMETRIOSIS;  Surgeon: Cheri Fowler, MD;  Location: [facility];  Service: Gynecology;  Laterality: N/A;  . PARTIAL HYSTERECTOMY    . TONSILLECTOMY    . TONSILLECTOMY AND ADENOIDECTOMY    . TUBAL LIGATION    . UPPER GI ENDOSCOPY     ulcer   Social History   Occupational History  . Not on file  Tobacco Use  . Smoking status: Never Smoker  . Smokeless tobacco: Never Used  Substance and Sexual Activity  . Alcohol use: No    Alcohol/week: 0.0 oz    Frequency: Never  . Drug use: No  . Sexual activity: Yes    Birth control/protection: Surgical

## 2017-10-29 NOTE — Telephone Encounter (Signed)
Please submit gel injections for patient. She has Medicaid.   BIL KNEE DR Erlinda Hong

## 2017-10-30 ENCOUNTER — Telehealth (INDEPENDENT_AMBULATORY_CARE_PROVIDER_SITE_OTHER): Payer: Self-pay

## 2017-10-30 NOTE — Telephone Encounter (Signed)
Mailed [employer] Patient Assistance application to patient to complete and return to our office for Monovisc injection, bilateral knee.

## 2017-10-30 NOTE — Telephone Encounter (Signed)
Noted  

## 2018-05-09 ENCOUNTER — Encounter (HOSPITAL_COMMUNITY): Payer: Self-pay | Admitting: *Deleted

## 2018-05-09 ENCOUNTER — Telehealth (HOSPITAL_COMMUNITY): Payer: Self-pay | Admitting: *Deleted

## 2018-05-09 ENCOUNTER — Ambulatory Visit (HOSPITAL_COMMUNITY)
Admission: EM | Admit: 2018-05-09 | Discharge: 2018-05-09 | Disposition: A | Discharge status: Home / Self Care | Payer: Medicaid Other

## 2018-05-09 DIAGNOSIS — M545 Low back pain, unspecified: Secondary | ICD-10-CM

## 2018-05-09 HISTORY — DX: Disorder of kidney and ureter, unspecified: N28.9

## 2018-05-09 MED ORDER — KETOROLAC TROMETHAMINE 30 MG/ML IJ SOLN
INTRAMUSCULAR | Status: AC
  Filled 2018-05-09: qty 1

## 2018-05-09 MED ORDER — PREDNISONE 10 MG (21) PO TBPK: ORAL_TABLET | Freq: Every day | ORAL | 0 refills | Status: DC

## 2018-05-09 MED ORDER — METHOCARBAMOL 750 MG PO TABS: 750.0000 mg | ORAL_TABLET | Freq: Three times a day (TID) | ORAL | 0 refills | Status: DC

## 2018-05-09 MED ORDER — KETOROLAC TROMETHAMINE 30 MG/ML IJ SOLN
30.0000 mg | Freq: Once | INTRAMUSCULAR | Status: AC
  Administered 2018-05-09: 30 mg via INTRAMUSCULAR

## 2018-05-09 NOTE — ED Provider Notes (Signed)
[facility]    CSN: [id number] Arrival date & time: 05/09/18  1031     History   Chief Complaint Chief Complaint  Patient presents with  . Back Pain    HPI [name] [name] is a 37 y.o. female history of arthritis, asthma, GERD, presenting today for evaluation of right lower back pain.  Patient states that approximately 2 weeks ago she was lifting heavy windows.  The day after she woke up with increased pain in her lower back.  She was seen by her PCP last week and was given a steroid injection.  Symptoms improved that day, but returned the following day and have worsened over the week as well.  Patient is occasionally heavy lifting at work.  Notices her pain worsening while at work.  She has been applying pain cream as well as occasional ibuprofen without relief.  She also was prescribed tizanidine, but states that her insurance did not cover this.  Patient states that she has a history of disc degenerative disc disease at L4 and L5, was previously seeing a pain clinic for this.  States that she has not had this evaluated in a while and is concerned about a slipped disc.  Patient denies any saddle anesthesia.  Denies loss of bowel or bladder control.  Denies radiation into the legs.  Pain worse on right side.  Denies weakness in legs.  HPI  Past Medical History:  Diagnosis Date  . Allergy   . Arthritis    right knee  . Asthma   . Constipation   . Depression   . GERD (gastroesophageal reflux disease)   . History of kidney stones   . Panic disorder   . Renal disorder   . Seasonal allergies   . SVD (spontaneous vaginal delivery)    x 3    Patient Active Problem List   Diagnosis Date Noted  . Pelvic pain in female 08/26/2017  . Pelvic peritoneal adhesions, female 08/26/2017  . Endometriosis of pelvis 08/26/2017  . Unilateral primary osteoarthritis, right knee 06/09/2017  . Unilateral primary osteoarthritis, left knee 06/09/2017  . Family history of breast cancer in  mother 12/06/2015  . Menorrhagia 05/31/2013  . Dysmenorrhea 05/31/2013  . Dyspareunia 05/31/2013  . Knee pain 08/24/2012  . Asthma 08/05/2011    Past Surgical History:  Procedure Laterality Date  . ABDOMINAL HYSTERECTOMY    . adiana    . BILATERAL SALPINGECTOMY Bilateral 05/31/2013   Procedure: BILATERAL SALPINGECTOMY;  Surgeon: Cheri Fowler, MD;  Location: [facility];  Service: Gynecology;  Laterality: Bilateral;  . BREAST CYST ASPIRATION Right 2015  . COLONOSCOPY    . DILATION AND CURETTAGE OF UTERUS    . DNC    . KIDNEY STONE SURGERY    . KNEE ARTHROSCOPY Right   . KNEE ARTHROSCOPY WITH DRILLING/MICROFRACTURE Right 07/26/2014   Procedure: RIGHT KNEE ARTHROSCOPY WITH CHONDROPLASTY, MICROFRACTURE;  Surgeon: Marianna Payment, MD;  Location: [facility];  Service: Orthopedics;  Laterality: Right;  . KNEE ARTHROSCOPY WITH MEDIAL MENISECTOMY Right 07/26/2014   Procedure: KNEE ARTHROSCOPY WITH MEDIAL MENISECTOMY;  Surgeon: Marianna Payment, MD;  Location: No [facility];  Service: Orthopedics;  Laterality: Right;  . LAPAROSCOPIC ASSISTED VAGINAL HYSTERECTOMY N/A 05/31/2013   Procedure: LAPAROSCOPIC ASSISTED VAGINAL HYSTERECTOMY;  Surgeon: Cheri Fowler, MD;  Location: [facility];  Service: Gynecology;  Laterality: N/A;  . LAPAROSCOPY N/A 08/26/2017   Procedure: LAPAROSCOPY OPERATIVE WITH PERIOTONEAL BIOPSY AND FULGERATION OF ENDOMETRIOSIS;  Surgeon: Cheri Fowler, MD;  Location: [facility];  Service: Gynecology;  Laterality: N/A;  . PARTIAL HYSTERECTOMY    . TONSILLECTOMY    . TONSILLECTOMY AND ADENOIDECTOMY    . TUBAL LIGATION    . UPPER GI ENDOSCOPY     ulcer    OB History   None      Home Medications    Prior to Admission medications   Medication Sig Start Date End Date Taking? Authorizing Provider  albuterol (PROVENTIL HFA;VENTOLIN HFA) 108 (90 BASE) MCG/ACT inhaler Inhale 1-2 puffs into the lungs every 6 (six) hours as needed for wheezing or shortness  of breath. 12/11/13  Yes Bjorn Pippin, PA-C  BUPROPION HCL PO Take by mouth.   Yes [provider]  busPIRone (BUSPAR) 5 MG tablet Take 5 mg by mouth 3 (three) times daily.   Yes [provider]  Fluticasone-Salmeterol (ADVAIR) 500-50 MCG/DOSE AEPB Inhale 1 puff into the lungs 2 (two) times daily.   Yes [provider]  loratadine (CLARITIN) 10 MG tablet Take 10 mg by mouth daily.   Yes [provider]  omeprazole (PRILOSEC) 40 MG capsule Take 40 mg by mouth daily.   Yes [provider]  ondansetron (ZOFRAN) 8 MG tablet Take 8 mg by mouth every 8 (eight) hours as needed for nausea or vomiting.   Yes [provider]  sucralfate (CARAFATE) 1 g tablet Take 1 g by mouth 2 (two) times daily with a meal.   Yes [provider]  acetaminophen (TYLENOL) 325 MG tablet Take 650 mg by mouth every 6 (six) hours as needed for moderate pain or headache.    [provider]  methocarbamol (ROBAXIN-750) 750 MG tablet Take 1 tablet (750 mg total) by mouth 3 (three) times daily. 05/09/18   Rhylen Shaheen C, PA-C  predniSONE (STERAPRED UNI-PAK 21 TAB) 10 MG (21) TBPK tablet Take by mouth daily. Take as directed 05/09/18   Janith Lima, PA-C    Family History Family History  Problem Relation Age of Onset  . Hypertension Mother   . Diabetes Mother   . Breast cancer Mother 56       IDC; s/p lumpectomy, radiation, and arimidex  . Prostate cancer Paternal Grandfather        dx. 50s-60s  . Other Sister        both full sisters also have dense breast tissue  . Breast cancer Maternal Grandmother 83       IDC  . Colon polyps Maternal Grandmother        approx 4-5 colon polyps  . Pancreatic cancer Other        maternal great aunt (MGF's sister) d. pancreatic cancer  . Cancer Other        maternal great aunt (MGF's sister) dx NOS cancer  . Cancer Other        maternal great grandmother (MGF's mother) d. NOS cancer in her 41s  . Cancer  Cousin 25       paternal 1st cousin dx. w/ either throat or thyroid cancer; not a smoker  . Breast cancer Other        paternal great aunt (PGF's sister) dx. breast cancer, 52s  . Colon cancer Neg Hx   . Rectal cancer Neg Hx   . Stomach cancer Neg Hx   . Esophageal cancer Neg Hx     Social History Social History   Tobacco Use  . Smoking status: Never Smoker  . Smokeless tobacco: Never Used  Substance Use  Topics  . Alcohol use: No    Alcohol/week: 0.0 standard drinks    Frequency: Never  . Drug use: No     Allergies   Aspirin; Morphine and related; and Nitrofurantoin monohyd macro   Review of Systems Review of Systems  Constitutional: Negative for fatigue and fever.  Eyes: Negative for visual disturbance.  Respiratory: Negative for shortness of breath.   Cardiovascular: Negative for chest pain.  Gastrointestinal: Negative for abdominal pain, nausea and vomiting.  Genitourinary: Negative for decreased urine volume and difficulty urinating.  Musculoskeletal: Positive for back pain, gait problem and myalgias. Negative for arthralgias, joint swelling, neck pain and neck stiffness.  Skin: Negative for color change, rash and wound.  Neurological: Negative for dizziness, weakness, light-headedness, numbness and headaches.     Physical Exam Triage Vital Signs ED Triage Vitals [05/09/18 1134]  Enc Vitals Group     BP (!) 137/94     Pulse Rate (!) 103     Resp 16     Temp 97.9 F (36.6 C)     Temp src      SpO2 100 %     Weight      Height      Head Circumference      Peak Flow      Pain Score 10     Pain Loc      Pain Edu?      Excl. in [place]?    No data found.  Updated Vital Signs BP (!) 137/94   Pulse (!) 103   Temp 97.9 F (36.6 C)   Resp 16   LMP 05/09/2013   SpO2 100%   Visual Acuity Right Eye Distance:   Left Eye Distance:   Bilateral Distance:    Right Eye Near:   Left Eye Near:    Bilateral Near:     Physical Exam  Constitutional: She  appears well-developed and well-nourished. No distress.  HENT:  Head: Normocephalic and atraumatic.  Eyes: Conjunctivae are normal.  Neck: Neck supple.  Cardiovascular: Normal rate and regular rhythm.  No murmur heard. Pulmonary/Chest: Effort normal and breath sounds normal. No respiratory distress.  Abdominal: Soft. There is no tenderness.  Musculoskeletal: She exhibits no edema.  Mild tenderness throughout lumbar region of back, increased tenderness to right lumbar musculature, negative straight leg raise bilaterally, hip strength 5/5 and equal bilaterally in all directions, pain elicited with resisted hip flexion, knee strength 5/5 and equal bilaterally, patellar reflex 2+  Neurological: She is alert.  Skin: Skin is warm and dry.  Psychiatric: She has a normal mood and affect.  Nursing note and vitals reviewed.    UC Treatments / Results  Labs (all labs ordered are listed, but only abnormal results are displayed) Labs Reviewed - No data to display  EKG None  Radiology No results found.  Procedures Procedures (including critical care time)  Medications Ordered in UC Medications  ketorolac (TORADOL) 30 MG/ML injection 30 mg (30 mg Intramuscular Given 05/09/18 1218)    Initial Impression / Assessment and Plan / UC Course  I have reviewed the triage vital signs and the nursing notes.  Pertinent labs & imaging results that were available during my care of the patient were reviewed by me and considered in my medical decision making (see chart for details).     Patient with low back pain.  No red flags for cauda equina.  Discussed with patient x-ray here versus with PCP.  Patient opted to wait to obtain  this from her PCP.  Today will provide with a shot of Toradol.  Patient requesting prednisone taper as this has helped her in the past.  Will provide steroid taper over 6 days.  We will change from tizanidine to Robaxin.  Discussed possible need for physical therapy to help with  strengthening back.Discussed strict return precautions. Patient verbalized understanding and is agreeable with plan.  Final Clinical Impressions(s) / UC Diagnoses   Final diagnoses:  Acute right-sided low back pain without sciatica     Discharge Instructions     We gave you a shot of Toradol today  Please begin prednisone taper with food on stomach, begin with 6 tabs on day 1, decrease by 1 tablet each day until you take 1 tab on day 6  May add in robaxin as needed 3-4 times a day  Alternate Ice and Heat  Do NOT stay in bed all day  Follow up with PCP Go to ED if developing weakness in legs, numbness between thighs or unable to control urination or bowel movement.    ED Prescriptions    Medication Sig Dispense Auth. Provider   predniSONE (STERAPRED UNI-PAK 21 TAB) 10 MG (21) TBPK tablet Take by mouth daily. Take as directed 21 tablet Omaya Nieland C, PA-C   methocarbamol (ROBAXIN-750) 750 MG tablet Take 1 tablet (750 mg total) by mouth 3 (three) times daily. 30 tablet Sala Tague, [place] C, PA-C     Controlled Substance Prescriptions [place] Controlled Substance Registry consulted? Not Applicable   Janith Lima, [place] 05/09/18 1257

## 2018-05-09 NOTE — ED Triage Notes (Signed)
Reports starting with right low back pain the day after lifting heavy windows at work approx 2 wks ago.  Saw PCP - was given steroid injection with slight improvement, but continues with significant pain.  Denies parasthesias.

## 2018-05-09 NOTE — Telephone Encounter (Signed)
Pt states she was expecting a Rx for IBU for inflammation.  Explained to pt that she received an injection that is similar, and that she has a steroid Rx.  Pt requesting IBU Rx, as she does not have the money to purchase OTC.  Discussed with H. Wieters, [place]; PA states she does not wish pt to take IBU along with steroid, esp with hx of ulcer, but pt may take Tylenol if desired.  Explained to pt; pt verbalized understanding.

## 2018-05-09 NOTE — Discharge Instructions (Signed)
We gave you a shot of Toradol today  Please begin prednisone taper with food on stomach, begin with 6 tabs on day 1, decrease by 1 tablet each day until you take 1 tab on day 6  May add in robaxin as needed 3-4 times a day  Alternate Ice and Heat  Do NOT stay in bed all day  Follow up with PCP Go to ED if developing weakness in legs, numbness between thighs or unable to control urination or bowel movement.

## 2018-08-04 ENCOUNTER — Ambulatory Visit (INDEPENDENT_AMBULATORY_CARE_PROVIDER_SITE_OTHER): Payer: Self-pay

## 2018-08-04 ENCOUNTER — Ambulatory Visit (INDEPENDENT_AMBULATORY_CARE_PROVIDER_SITE_OTHER): Payer: Medicaid Other | Admitting: Orthopaedic Surgery

## 2018-08-04 ENCOUNTER — Encounter (INDEPENDENT_AMBULATORY_CARE_PROVIDER_SITE_OTHER): Payer: Self-pay | Admitting: Orthopaedic Surgery

## 2018-08-04 ENCOUNTER — Ambulatory Visit (INDEPENDENT_AMBULATORY_CARE_PROVIDER_SITE_OTHER): Payer: Medicaid Other

## 2018-08-04 VITALS — Ht 62.0 in | Wt 143.0 lb

## 2018-08-04 DIAGNOSIS — M1711 Unilateral primary osteoarthritis, right knee: Secondary | ICD-10-CM

## 2018-08-04 DIAGNOSIS — M1712 Unilateral primary osteoarthritis, left knee: Secondary | ICD-10-CM

## 2018-08-04 DIAGNOSIS — M25561 Pain in right knee: Secondary | ICD-10-CM

## 2018-08-04 DIAGNOSIS — M25562 Pain in left knee: Secondary | ICD-10-CM

## 2018-08-04 DIAGNOSIS — G8929 Other chronic pain: Secondary | ICD-10-CM | POA: Diagnosis not present

## 2018-08-04 MED ORDER — LIDOCAINE HCL 1 % IJ SOLN
2.0000 mL | INTRAMUSCULAR | Status: AC | PRN
  Administered 2018-08-04: 2 mL

## 2018-08-04 MED ORDER — BUPIVACAINE HCL 0.5 % IJ SOLN
2.0000 mL | INTRAMUSCULAR | Status: AC | PRN
  Administered 2018-08-04: 2 mL via INTRA_ARTICULAR

## 2018-08-04 MED ORDER — METHYLPREDNISOLONE ACETATE 40 MG/ML IJ SUSP
40.0000 mg | INTRAMUSCULAR | Status: AC | PRN
  Administered 2018-08-04: 40 mg via INTRA_ARTICULAR

## 2018-08-04 NOTE — Progress Notes (Signed)
Office Visit Note   Patient: [name] [name]           Date of Birth: 11-Jul-1980           MRN: [id number] Visit Date: 08/04/2018              Requested by: [facility], [facility] [address] [place], [place] [address] PCP: [place]: Visit Diagnoses:  1. Chronic pain of both knees   2. Unilateral primary osteoarthritis, left knee   3. Unilateral primary osteoarthritis, right knee     Plan: Impression is bilateral knee osteoarthritis worse on the right.  She has moderate effusion on the left knee.  Aspiration injection performed today for the left knee.  Questions encouraged and answered.  Follow-up as needed.  Follow-Up Instructions: Return if symptoms worsen or fail to improve.   Orders:  Orders Placed This Encounter  Procedures  . XR Knee Complete 4 Views Right  . XR Knee Complete 4 Views Left   No orders of the defined types were placed in this encounter.     Procedures: Large Joint Inj: L knee on 08/04/2018 5:02 PM Details: 22 G needle Medications: 2 mL bupivacaine 0.5 %; 2 mL lidocaine 1 %; 40 mg methylPREDNISolone acetate 40 MG/ML Aspirate: 5 mL blood-tinged Outcome: tolerated well, no immediate complications Patient was prepped and draped in the usual sterile fashion.       Clinical Data: No additional findings.   Subjective: Chief Complaint  Patient presents with  . Right Knee - Pain  . Left Knee - Pain    [name] [name] is a 38 year old female comes in with recent worsening of her left knee pain and swelling.  She is undergone couple of right knee arthroscopies that have unfortunately revealed grade III-IV chondromalacia.  She states that the left knee has been swelling recently and her right knee is hurting more because of compensation.  She denies any injuries.  She denies any constitutional symptoms.   Review of Systems  Constitutional: Negative.   HENT: Negative.   Eyes: Negative.   Respiratory: Negative.     Cardiovascular: Negative.   Endocrine: Negative.   Musculoskeletal: Negative.   Neurological: Negative.   Hematological: Negative.   Psychiatric/Behavioral: Negative.   All other systems reviewed and are negative.    Objective: Vital Signs: Ht 5\' 2"  (1.575 m)   Wt 143 lb (64.9 kg)   LMP 05/09/2013   BMI 26.16 kg/m   Physical Exam Vitals signs and nursing note reviewed.  Constitutional:      Appearance: She is well-developed.  Pulmonary:     Effort: Pulmonary effort is normal.  Skin:    General: Skin is warm.     Capillary Refill: Capillary refill takes less than 2 seconds.  Neurological:     Mental Status: She is alert and oriented to person, place, and time.  Psychiatric:        Behavior: Behavior normal.        Thought Content: Thought content normal.        Judgment: Judgment normal.     Ortho Exam Left knee exam shows a moderate joint effusion.  Right knee shows a trace effusion.  Collaterals and cruciates are stable.  Normal range of motion. Specialty Comments:  No specialty comments available.  Imaging: Xr Knee Complete 4 Views Left  Result Date: 08/04/2018 Mild osteoarthritis.  Xr Knee Complete 4 Views Right  Result Date: 08/04/2018 Mild to moderate osteoarthritis  PMFS History: Patient Active Problem List   Diagnosis Date Noted  . Pelvic pain in female 08/26/2017  . Pelvic peritoneal adhesions, female 08/26/2017  . Endometriosis of pelvis 08/26/2017  . Unilateral primary osteoarthritis, right knee 06/09/2017  . Unilateral primary osteoarthritis, left knee 06/09/2017  . Family history of breast cancer in mother 12/06/2015  . Menorrhagia 05/31/2013  . Dysmenorrhea 05/31/2013  . Dyspareunia 05/31/2013  . Knee pain 08/24/2012  . Asthma 08/05/2011   Past Medical History:  Diagnosis Date  . Allergy   . Arthritis    right knee  . Asthma   . Constipation   . Depression   . GERD (gastroesophageal reflux disease)   . History of kidney  stones   . Panic disorder   . Renal disorder   . Seasonal allergies   . SVD (spontaneous vaginal delivery)    x 3    Family History  Problem Relation Age of Onset  . Hypertension Mother   . Diabetes Mother   . Breast cancer Mother 21       IDC; s/p lumpectomy, radiation, and arimidex  . Prostate cancer Paternal Grandfather        dx. 50s-60s  . Other Sister        both full sisters also have dense breast tissue  . Breast cancer Maternal Grandmother 83       IDC  . Colon polyps Maternal Grandmother        approx 4-5 colon polyps  . Pancreatic cancer Other        maternal great aunt (MGF's sister) d. pancreatic cancer  . Cancer Other        maternal great aunt (MGF's sister) dx NOS cancer  . Cancer Other        maternal great grandmother (MGF's mother) d. NOS cancer in her 90s  . Cancer Cousin 25       paternal 1st cousin dx. w/ either throat or thyroid cancer; not a smoker  . Breast cancer Other        paternal great aunt (PGF's sister) dx. breast cancer, 62s  . Colon cancer Neg Hx   . Rectal cancer Neg Hx   . Stomach cancer Neg Hx   . Esophageal cancer Neg Hx     Past Surgical History:  Procedure Laterality Date  . ABDOMINAL HYSTERECTOMY    . adiana    . BILATERAL SALPINGECTOMY Bilateral 05/31/2013   Procedure: BILATERAL SALPINGECTOMY;  Surgeon: Cheri Fowler, MD;  Location: [facility];  Service: Gynecology;  Laterality: Bilateral;  . BREAST CYST ASPIRATION Right 2015  . COLONOSCOPY    . DILATION AND CURETTAGE OF UTERUS    . DNC    . KIDNEY STONE SURGERY    . KNEE ARTHROSCOPY Right   . KNEE ARTHROSCOPY WITH DRILLING/MICROFRACTURE Right 07/26/2014   Procedure: RIGHT KNEE ARTHROSCOPY WITH CHONDROPLASTY, MICROFRACTURE;  Surgeon: Marianna Payment, MD;  Location: [facility];  Service: Orthopedics;  Laterality: Right;  . KNEE ARTHROSCOPY WITH MEDIAL MENISECTOMY Right 07/26/2014   Procedure: KNEE ARTHROSCOPY WITH MEDIAL MENISECTOMY;  Surgeon: Marianna Payment, MD;  Location: [facility];  Service: Orthopedics;  Laterality: Right;  . LAPAROSCOPIC ASSISTED VAGINAL HYSTERECTOMY N/A 05/31/2013   Procedure: LAPAROSCOPIC ASSISTED VAGINAL HYSTERECTOMY;  Surgeon: Cheri Fowler, MD;  Location: [facility];  Service: Gynecology;  Laterality: N/A;  . LAPAROSCOPY N/A 08/26/2017   Procedure: LAPAROSCOPY OPERATIVE WITH PERIOTONEAL BIOPSY AND FULGERATION OF ENDOMETRIOSIS;  Surgeon: Cheri Fowler, MD;  Location:  [facility];  Service: Gynecology;  Laterality: N/A;  . PARTIAL HYSTERECTOMY    . TONSILLECTOMY    . TONSILLECTOMY AND ADENOIDECTOMY    . TUBAL LIGATION    . UPPER GI ENDOSCOPY     ulcer   Social History   Occupational History  . Not on file  Tobacco Use  . Smoking status: Never Smoker  . Smokeless tobacco: Never Used  Substance and Sexual Activity  . Alcohol use: No    Alcohol/week: 0.0 standard drinks    Frequency: Never  . Drug use: No  . Sexual activity: Not on file

## 2018-12-02 ENCOUNTER — Other Ambulatory Visit: Payer: Self-pay | Admitting: Neurosurgery

## 2018-12-28 ENCOUNTER — Other Ambulatory Visit (HOSPITAL_COMMUNITY)
Admission: RE | Admit: 2018-12-28 | Discharge: 2018-12-28 | Disposition: A | Discharge status: Home / Self Care | Payer: Medicaid Other | Source: Ambulatory Visit | Attending: Neurosurgery | Admitting: Neurosurgery

## 2018-12-28 DIAGNOSIS — Z1159 Encounter for screening for other viral diseases: Secondary | ICD-10-CM | POA: Diagnosis not present

## 2018-12-28 LAB — SARS CORONAVIRUS 2 (TAT 6-24 HRS): SARS Coronavirus 2: NEGATIVE

## 2018-12-28 NOTE — Pre-Procedure Instructions (Signed)
[name] [name]  12/28/2018      [id number] - [place], [place] [address] AT [address] [place] [address] Phone: [phone] Fax: [phone]    Your procedure is scheduled on December 31, 2018.  Report to [facility] Entrance "A" at 1115 AM.  Call this number if you have problems the morning of surgery:  [phone]   Remember:  Do not eat or drink after midnight.    Take these medicines the morning of surgery with A SIP OF WATER  Tylenol-if needed albuterol (PROVENTIL HFA;VENTOLIN HFA)inhaler-if needed, bring with you buPROPion (WELLBUTRIN XL)  busPIRone (BUSPAR) Fluticasone-Salmeterol (ADVAIR)  omeprazole (PRILOSEC)  traMADol (ULTRAM)-if needed  Beginning now, STOP taking any Aspirin (unless otherwise instructed by your surgeon), Aleve, Naproxen, Ibuprofen, Motrin, Advil, Goody's, BC's, all herbal medications, fish oil, and all vitamins    Do not wear jewelry, make-up or nail polish.  Do not wear lotions, powders, or perfumes, or deodorant.  Do not shave 48 hours prior to surgery.   Do not bring valuables to the hospital.  [facility] is not responsible for any belongings or valuables.  Contacts, dentures or bridgework may not be worn into surgery.  Leave your suitcase in the car.  After surgery it may be brought to your room.  For patients admitted to the hospital, discharge time will be determined by your treatment team.  Patients discharged the day of surgery will not be allowed to drive home.    [facility]- Preparing For Surgery  Before surgery, you can play an important role. Because skin is not sterile, your skin needs to be as free of germs as possible. You can reduce the number of germs on your skin by washing with CHG (chlorahexidine gluconate) Soap before surgery.  CHG is an antiseptic cleaner which kills germs and bonds with the skin to continue killing germs even after washing.    Oral  Hygiene is also important to reduce your risk of infection.  Remember - BRUSH YOUR TEETH THE MORNING OF SURGERY WITH YOUR REGULAR TOOTHPASTE  Please do not use if you have an allergy to CHG or antibacterial soaps. If your skin becomes reddened/irritated stop using the CHG.  Do not shave (including legs and underarms) for at least 48 hours prior to first CHG shower. It is OK to shave your face.  Please follow these instructions carefully.   1. Shower the NIGHT BEFORE SURGERY and the MORNING OF SURGERY with CHG.   2. If you chose to wash your hair, wash your hair first as usual with your normal shampoo.  3. After you shampoo, rinse your hair and body thoroughly to remove the shampoo.  4. Use CHG as you would any other liquid soap. You can apply CHG directly to the skin and wash gently with a scrungie or a clean washcloth.   5. Apply the CHG Soap to your body ONLY FROM THE NECK DOWN.  Do not use on open wounds or open sores. Avoid contact with your eyes, ears, mouth and genitals (private parts). Wash Face and genitals (private parts)  with your normal soap.  6. Wash thoroughly, paying special attention to the area where your surgery will be performed.  7. Thoroughly rinse your body with warm water from the neck down.  8. DO NOT shower/wash with your normal soap after using and rinsing off the CHG Soap.  9. Pat yourself dry with a CLEAN TOWEL.  10.  Wear CLEAN PAJAMAS to bed the night before surgery, wear comfortable clothes the morning of surgery  11. Place CLEAN SHEETS on your bed the night of your first shower and DO NOT SLEEP WITH PETS.   Day of Surgery:  Do not apply any deodorants/lotions.  Please wear clean clothes to the hospital/surgery center.   Remember to brush your teeth WITH YOUR REGULAR TOOTHPASTE.   Please read over the following fact sheets that you were given.

## 2018-12-29 ENCOUNTER — Encounter (HOSPITAL_COMMUNITY): Payer: Self-pay

## 2018-12-29 ENCOUNTER — Other Ambulatory Visit: Payer: Self-pay

## 2018-12-29 ENCOUNTER — Other Ambulatory Visit: Payer: Self-pay | Admitting: Neurosurgery

## 2018-12-29 ENCOUNTER — Encounter (HOSPITAL_COMMUNITY)
Admission: RE | Admit: 2018-12-29 | Discharge: 2018-12-29 | Disposition: A | Discharge status: Home / Self Care | Payer: Medicaid Other | Source: Ambulatory Visit | Attending: Neurosurgery | Admitting: Neurosurgery

## 2018-12-29 DIAGNOSIS — Z01812 Encounter for preprocedural laboratory examination: Secondary | ICD-10-CM | POA: Diagnosis present

## 2018-12-29 LAB — BASIC METABOLIC PANEL
Anion gap: 7 (ref 5–15)
BUN: 13 mg/dL (ref 6–20)
CO2: 23 mmol/L (ref 22–32)
Calcium: 9.3 mg/dL (ref 8.9–10.3)
Chloride: 108 mmol/L (ref 98–111)
Creatinine, Ser: 0.88 mg/dL (ref 0.44–1.00)
GFR calc Af Amer: >60 mL/min (ref >60)
GFR calc non Af Amer: >60 mL/min (ref >60)
Glucose, Bld: 123 mg/dL — ABNORMAL HIGH (ref 70–99)
Potassium: 4.2 mmol/L (ref 3.5–5.1)
Sodium: 138 mmol/L (ref 135–145)

## 2018-12-29 LAB — CBC
HCT: 44.3 % (ref 36.0–46.0)
Hemoglobin: 14.5 g/dL (ref 12.0–15.0)
MCH: 31.8 pg (ref 26.0–34.0)
MCHC: 32.7 g/dL (ref 30.0–36.0)
MCV: 97.1 fL (ref 80.0–100.0)
Platelets: 216 10*3/uL (ref 150–400)
RBC: 4.56 MIL/uL (ref 3.87–5.11)
RDW: 12.2 % (ref 11.5–15.5)
WBC: 7.4 10*3/uL (ref 4.0–10.5)
nRBC: 0 % (ref 0.0–0.2)

## 2018-12-29 LAB — SURGICAL PCR SCREEN
MRSA, PCR: NEGATIVE
Staphylococcus aureus: POSITIVE — AB

## 2018-12-29 NOTE — H&P (Signed)
Chief Complaint   Back pain  HPI   HPI: [name] [name] is a 38 y.o. female with chronic lower back pain as well as right lower extremity pain.   She reports a constant 7/10 pain in her low back with radiation into her right buttocks and into her right calf.  She denies left lower extremity symptoms.  She denies bowel or bladder dysfunction.She underwent an MRI of her lumbar spine which revealed a large right eccentric L5-S1 disc herniation.  She presents today for surgery.  She is without any concerns.   Patient Active Problem List   Diagnosis Date Noted  . Pelvic pain in female 08/26/2017  . Pelvic peritoneal adhesions, female 08/26/2017  . Endometriosis of pelvis 08/26/2017  . Unilateral primary osteoarthritis, right knee 06/09/2017  . Unilateral primary osteoarthritis, left knee 06/09/2017  . Family history of breast cancer in mother 12/06/2015  . Menorrhagia 05/31/2013  . Dysmenorrhea 05/31/2013  . Dyspareunia 05/31/2013  . Knee pain 08/24/2012  . Asthma 08/05/2011    PMH: Past Medical History:  Diagnosis Date  . Allergy   . Arthritis    right knee  . Asthma   . Constipation   . Depression   . GERD (gastroesophageal reflux disease)   . History of kidney stones   . Panic disorder   . Renal disorder   . Seasonal allergies   . SVD (spontaneous vaginal delivery)    x 3    PSH: Past Surgical History:  Procedure Laterality Date  . ABDOMINAL HYSTERECTOMY    . adiana    . BILATERAL SALPINGECTOMY Bilateral 05/31/2013   Procedure: BILATERAL SALPINGECTOMY;  Surgeon: Cheri Fowler, MD;  Location: [facility];  Service: Gynecology;  Laterality: Bilateral;  . BREAST CYST ASPIRATION Right 2015  . COLONOSCOPY    . DILATION AND CURETTAGE OF UTERUS    . DNC    . KIDNEY STONE SURGERY    . KNEE ARTHROSCOPY Right   . KNEE ARTHROSCOPY WITH DRILLING/MICROFRACTURE Right 07/26/2014   Procedure: RIGHT KNEE ARTHROSCOPY WITH CHONDROPLASTY, MICROFRACTURE;  Surgeon: Marianna Payment, MD;  Location: [facility];  Service: Orthopedics;  Laterality: Right;  . KNEE ARTHROSCOPY WITH MEDIAL MENISECTOMY Right 07/26/2014   Procedure: KNEE ARTHROSCOPY WITH MEDIAL MENISECTOMY;  Surgeon: Marianna Payment, MD;  Location: [facility];  Service: Orthopedics;  Laterality: Right;  . LAPAROSCOPIC ASSISTED VAGINAL HYSTERECTOMY N/A 05/31/2013   Procedure: LAPAROSCOPIC ASSISTED VAGINAL HYSTERECTOMY;  Surgeon: Cheri Fowler, MD;  Location: [facility];  Service: Gynecology;  Laterality: N/A;  . LAPAROSCOPY N/A 08/26/2017   Procedure: LAPAROSCOPY OPERATIVE WITH PERIOTONEAL BIOPSY AND FULGERATION OF ENDOMETRIOSIS;  Surgeon: Cheri Fowler, MD;  Location: [facility];  Service: Gynecology;  Laterality: N/A;  . PARTIAL HYSTERECTOMY    . TONSILLECTOMY    . TONSILLECTOMY AND ADENOIDECTOMY    . TUBAL LIGATION    . UPPER GI ENDOSCOPY     ulcer    (Not in a hospital admission)   SH: Social History   Tobacco Use  . Smoking status: Never Smoker  . Smokeless tobacco: Never Used  Substance Use Topics  . Alcohol use: No    Alcohol/week: 0.0 standard drinks    Frequency: Never  . Drug use: No    MEDS: Prior to Admission medications   Medication Sig Start Date End Date Taking? Authorizing Provider  acetaminophen (TYLENOL) 325 MG tablet Take 650 mg by mouth every 6 (six) hours as needed for moderate pain or headache.  [provider]  albuterol (PROVENTIL HFA;VENTOLIN HFA) 108 (90 BASE) MCG/ACT inhaler Inhale 1-2 puffs into the lungs every 6 (six) hours as needed for wheezing or shortness of breath. 12/11/13   Bjorn Pippin, PA-C  buPROPion (WELLBUTRIN XL) 150 MG 24 hr tablet Take 150 mg by mouth daily.    [provider]  busPIRone (BUSPAR) 5 MG tablet Take 5 mg by mouth daily as needed (panic attacks).     [provider]  Fluticasone-Salmeterol (ADVAIR) 500-50 MCG/DOSE AEPB Inhale 1 puff into the lungs 2 (two) times daily.     [provider]  methocarbamol (ROBAXIN-750) 750 MG tablet Take 1 tablet (750 mg total) by mouth 3 (three) times daily. Patient not taking: Reported on 12/23/2018 05/09/18   Wieters, Hallie C, PA-C  omeprazole (PRILOSEC) 40 MG capsule Take 40 mg by mouth daily.    [provider]  predniSONE (STERAPRED UNI-PAK 21 TAB) 10 MG (21) TBPK tablet Take by mouth daily. Take as directed Patient not taking: Reported on 12/23/2018 05/09/18   Wieters, Hallie C, PA-C  traMADol (ULTRAM) 50 MG tablet Take 50 mg by mouth every 6 (six) hours as needed for moderate pain.    [provider]    ALLERGY: Allergies  Allergen Reactions  . Duloxetine Hcl Nausea And Vomiting  . Nitrofurantoin Monohyd Macro Hives and Shortness Of Breath  . Aspirin     Bleeding ulcer  . Morphine And Related Nausea And Vomiting    Social History   Tobacco Use  . Smoking status: Never Smoker  . Smokeless tobacco: Never Used  Substance Use Topics  . Alcohol use: No    Alcohol/week: 0.0 standard drinks    Frequency: Never     Family History  Problem Relation Age of Onset  . Hypertension Mother   . Diabetes Mother   . Breast cancer Mother 104       IDC; s/p lumpectomy, radiation, and arimidex  . Prostate cancer Paternal Grandfather        dx. 50s-60s  . Other Sister        both full sisters also have dense breast tissue  . Breast cancer Maternal Grandmother 83       IDC  . Colon polyps Maternal Grandmother        approx 4-5 colon polyps  . Pancreatic cancer Other        maternal great aunt (MGF's sister) d. pancreatic cancer  . Cancer Other        maternal great aunt (MGF's sister) dx NOS cancer  . Cancer Other        maternal great grandmother (MGF's mother) d. NOS cancer in her 47s  . Cancer Cousin 25       paternal 1st cousin dx. w/ either throat or thyroid cancer; not a smoker  . Breast cancer Other        paternal great aunt (PGF's sister) dx. breast cancer, 64s  . Colon cancer Neg Hx    . Rectal cancer Neg Hx   . Stomach cancer Neg Hx   . Esophageal cancer Neg Hx      ROS   ROS  Exam   There were no vitals filed for this visit. General appearance: WDWN, NAD Eyes: No scleral injection Cardiovascular: Regular rate and rhythm without murmurs, rubs, gallops. No edema or variciosities. Distal pulses normal. Pulmonary: Effort normal, non-labored breathing Musculoskeletal:     Muscle tone upper extremities: Normal    Muscle tone lower  extremities: Normal    Motor exam: Upper Extremities Deltoid Bicep Tricep Grip  Right 5/5 5/5 5/5 5/5  Left 5/5 5/5 5/5 5/5   Lower Extremity IP Quad PF DF EHL  Right 5/5 5/5 5/5 5/5 5/5  Left 5/5 5/5 5/5 5/5 5/5   Neurological Mental Status:    - Patient is awake, alert, oriented to person, place, month, year, and situation    - Patient is able to give a clear and coherent history.    - No signs of aphasia or neglect Cranial Nerves    - II: Visual Fields are full. PERRL    - III/IV/VI: EOMI without ptosis or diploplia.     - V: Facial sensation is grossly normal    - VII: Facial movement is symmetric.     - VIII: hearing is intact to voice    - X: Uvula elevates symmetrically    - XI: Shoulder shrug is symmetric.    - XII: tongue is midline without atrophy or fasciculations.  Sensory: Sensation grossly intact to LT  Results - Imaging/Labs   Results for orders placed or performed during the hospital encounter of 12/28/18 (from the past 48 hour(s))  SARS Coronavirus 2 (Performed in [facility] hospital lab)     Status: None   Collection Time: 12/28/18  9:49 AM   Specimen: Nasal Swab  Result Value Ref Range   SARS Coronavirus 2 NEGATIVE NEGATIVE    Comment: (NOTE) SARS-CoV-2 target nucleic acids are NOT DETECTED. The SARS-CoV-2 RNA is generally detectable in upper and lower respiratory specimens during the acute phase of infection. Negative results do not preclude SARS-CoV-2 infection, do not rule out co-infections  with other pathogens, and should not be used as the sole basis for treatment or other patient management decisions. Negative results must be combined with clinical observations, patient history, and epidemiological information. The expected result is Negative. Fact Sheet for Patients: TrashEliminator.se Fact Sheet for Healthcare Providers: WhoisBlogging.ch This test is not yet approved or cleared by the [place] FDA and  has been authorized for detection and/or diagnosis of SARS-CoV-2 by FDA under an Emergency Use Authorization (EUA). This EUA will remain  in effect (meaning this test can be used) for the duration of the COVID-19 declaration under Section 56 4(b)(1) of the Act, 21 U.S.C. section 360bbb-3(b)(1), unless the authorization is terminated or revoked sooner. Performed at [facility], [place] [address], [place], [place] [address]     No results found.  IMAGING: MRI of the lumbar spine dated 11/18/2018 was reviewed.  This does demonstrate maintenance of normal lumbar lordosis.  Primary findings at L5-S1 where there is disc desiccation with some loss of height.  There is reactive endplate change.  There is a fairly broad-based right eccentric disc herniation with resultant significant lateral recess stenosis and compression of the traversing right S1 nerve root.   Impression/Plan   38 y.o. female with chronic back and more recent right-sided leg pain related to large right eccentric L5-S1 disc herniation.   We will proceed with right-sided L5-S1 laminotomy and microdiskectomy.  While in the office the risks of the surgery were discussed in detail, including but not limited to bleeding, infection, nerve injury resulting in leg/foot weakness/numbness or bowel/bladder dysfunction, and CSF leak. Possible outcomes of surgery were also discussed including the possibility of persistence or worsening of pain symptoms. We  discussed the possible need for additional spinal surgery in the future. The general risks of anesthesia were also  reviewed including heart attack, stroke, and DVT/PE.    The patient understood our discussion and is willing to proceed with surgery. All questions were answered.  Ferne Reus, PA-C [facility] [employer]

## 2018-12-29 NOTE — Progress Notes (Signed)
PCP - [facility] medical  Cardiologist - denies  Chest x-ray - not needed EKG - not needed Stress Test - denies ECHO - denies Cardiac Cath - deneis   Anesthesia review: No  Covid test negative, spoke to patient again about the importance of quarantining   Patient denies shortness of breath, fever, cough and chest pain at PAT appointment   Patient verbalized understanding of instructions that were given to them at the PAT appointment. Patient was also instructed that they will need to review over the PAT instructions again at home before surgery.

## 2018-12-31 ENCOUNTER — Encounter (HOSPITAL_COMMUNITY): Payer: Self-pay

## 2018-12-31 ENCOUNTER — Other Ambulatory Visit: Payer: Self-pay

## 2018-12-31 ENCOUNTER — Ambulatory Visit (HOSPITAL_COMMUNITY): Payer: Medicaid Other

## 2018-12-31 ENCOUNTER — Ambulatory Visit (HOSPITAL_COMMUNITY)
Admission: RE | Admit: 2018-12-31 | Discharge: 2018-12-31 | Disposition: A | Discharge status: Home / Self Care | Payer: Medicaid Other | Attending: Neurosurgery | Admitting: Neurosurgery

## 2018-12-31 ENCOUNTER — Ambulatory Visit (HOSPITAL_COMMUNITY): Payer: Medicaid Other | Admitting: Vascular Surgery

## 2018-12-31 ENCOUNTER — Encounter (HOSPITAL_COMMUNITY)
Admission: RE | Disposition: A | Discharge status: Home / Self Care | Payer: Self-pay | Source: Home / Self Care | Attending: Neurosurgery

## 2018-12-31 ENCOUNTER — Ambulatory Visit (HOSPITAL_COMMUNITY): Payer: Medicaid Other | Admitting: Certified Registered Nurse Anesthetist

## 2018-12-31 DIAGNOSIS — Z803 Family history of malignant neoplasm of breast: Secondary | ICD-10-CM | POA: Diagnosis not present

## 2018-12-31 DIAGNOSIS — N920 Excessive and frequent menstruation with regular cycle: Secondary | ICD-10-CM | POA: Diagnosis not present

## 2018-12-31 DIAGNOSIS — Z79899 Other long term (current) drug therapy: Secondary | ICD-10-CM | POA: Diagnosis not present

## 2018-12-31 DIAGNOSIS — K219 Gastro-esophageal reflux disease without esophagitis: Secondary | ICD-10-CM | POA: Diagnosis not present

## 2018-12-31 DIAGNOSIS — Z885 Allergy status to narcotic agent status: Secondary | ICD-10-CM | POA: Insufficient documentation

## 2018-12-31 DIAGNOSIS — Z800 Family history of malignant neoplasm of digestive organs: Secondary | ICD-10-CM | POA: Insufficient documentation

## 2018-12-31 DIAGNOSIS — G8929 Other chronic pain: Secondary | ICD-10-CM | POA: Insufficient documentation

## 2018-12-31 DIAGNOSIS — N941 Unspecified dyspareunia: Secondary | ICD-10-CM | POA: Diagnosis not present

## 2018-12-31 DIAGNOSIS — Z90710 Acquired absence of both cervix and uterus: Secondary | ICD-10-CM | POA: Insufficient documentation

## 2018-12-31 DIAGNOSIS — Z888 Allergy status to other drugs, medicaments and biological substances status: Secondary | ICD-10-CM | POA: Insufficient documentation

## 2018-12-31 DIAGNOSIS — Z881 Allergy status to other antibiotic agents status: Secondary | ICD-10-CM | POA: Diagnosis not present

## 2018-12-31 DIAGNOSIS — Z8249 Family history of ischemic heart disease and other diseases of the circulatory system: Secondary | ICD-10-CM | POA: Insufficient documentation

## 2018-12-31 DIAGNOSIS — Z419 Encounter for procedure for purposes other than remedying health state, unspecified: Secondary | ICD-10-CM

## 2018-12-31 DIAGNOSIS — Z8042 Family history of malignant neoplasm of prostate: Secondary | ICD-10-CM | POA: Insufficient documentation

## 2018-12-31 DIAGNOSIS — Z833 Family history of diabetes mellitus: Secondary | ICD-10-CM | POA: Insufficient documentation

## 2018-12-31 DIAGNOSIS — Z7951 Long term (current) use of inhaled steroids: Secondary | ICD-10-CM | POA: Diagnosis not present

## 2018-12-31 DIAGNOSIS — N946 Dysmenorrhea, unspecified: Secondary | ICD-10-CM | POA: Diagnosis not present

## 2018-12-31 DIAGNOSIS — M5117 Intervertebral disc disorders with radiculopathy, lumbosacral region: Secondary | ICD-10-CM | POA: Insufficient documentation

## 2018-12-31 DIAGNOSIS — K5900 Constipation, unspecified: Secondary | ICD-10-CM | POA: Diagnosis not present

## 2018-12-31 DIAGNOSIS — J449 Chronic obstructive pulmonary disease, unspecified: Secondary | ICD-10-CM | POA: Insufficient documentation

## 2018-12-31 DIAGNOSIS — Z8371 Family history of colonic polyps: Secondary | ICD-10-CM | POA: Insufficient documentation

## 2018-12-31 DIAGNOSIS — J45909 Unspecified asthma, uncomplicated: Secondary | ICD-10-CM | POA: Insufficient documentation

## 2018-12-31 DIAGNOSIS — Z87442 Personal history of urinary calculi: Secondary | ICD-10-CM | POA: Insufficient documentation

## 2018-12-31 DIAGNOSIS — M170 Bilateral primary osteoarthritis of knee: Secondary | ICD-10-CM | POA: Insufficient documentation

## 2018-12-31 DIAGNOSIS — N736 Female pelvic peritoneal adhesions (postinfective): Secondary | ICD-10-CM | POA: Insufficient documentation

## 2018-12-31 DIAGNOSIS — Z886 Allergy status to analgesic agent status: Secondary | ICD-10-CM | POA: Insufficient documentation

## 2018-12-31 DIAGNOSIS — F419 Anxiety disorder, unspecified: Secondary | ICD-10-CM | POA: Diagnosis not present

## 2018-12-31 DIAGNOSIS — F329 Major depressive disorder, single episode, unspecified: Secondary | ICD-10-CM | POA: Diagnosis not present

## 2018-12-31 DIAGNOSIS — M5116 Intervertebral disc disorders with radiculopathy, lumbar region: Secondary | ICD-10-CM | POA: Diagnosis present

## 2018-12-31 HISTORY — PX: LUMBAR LAMINECTOMY/DECOMPRESSION MICRODISCECTOMY: SHX5026

## 2018-12-31 SURGERY — LUMBAR LAMINECTOMY/DECOMPRESSION MICRODISCECTOMY 1 LEVEL
Anesthesia: General | Site: Spine Lumbar | Laterality: Right

## 2018-12-31 MED ORDER — METHYLPREDNISOLONE ACETATE 80 MG/ML IJ SUSP
INTRAMUSCULAR | Status: DC | PRN
  Administered 2018-12-31: 80 mg

## 2018-12-31 MED ORDER — MIDAZOLAM HCL 2 MG/2ML IJ SOLN
INTRAMUSCULAR | Status: AC
  Filled 2018-12-31: qty 2

## 2018-12-31 MED ORDER — ONDANSETRON HCL 4 MG/2ML IJ SOLN
INTRAMUSCULAR | Status: DC | PRN
  Administered 2018-12-31 (×2): 4 mg via INTRAVENOUS

## 2018-12-31 MED ORDER — SCOPOLAMINE 1 MG/3DAYS TD PT72
MEDICATED_PATCH | TRANSDERMAL | Status: AC
  Filled 2018-12-31: qty 1

## 2018-12-31 MED ORDER — DEXAMETHASONE SODIUM PHOSPHATE 10 MG/ML IJ SOLN
INTRAMUSCULAR | Status: AC
  Filled 2018-12-31: qty 1

## 2018-12-31 MED ORDER — LACTATED RINGERS IV SOLN
INTRAVENOUS | Status: DC
  Administered 2018-12-31 (×2): via INTRAVENOUS

## 2018-12-31 MED ORDER — DEXAMETHASONE SODIUM PHOSPHATE 10 MG/ML IJ SOLN
INTRAMUSCULAR | Status: DC | PRN
  Administered 2018-12-31: 10 mg via INTRAVENOUS

## 2018-12-31 MED ORDER — LIDOCAINE-EPINEPHRINE 1 %-1:100000 IJ SOLN
INTRAMUSCULAR | Status: AC
  Filled 2018-12-31: qty 1

## 2018-12-31 MED ORDER — PROPOFOL 10 MG/ML IV BOLUS
INTRAVENOUS | Status: AC
  Filled 2018-12-31: qty 20

## 2018-12-31 MED ORDER — PROPOFOL 10 MG/ML IV BOLUS
INTRAVENOUS | Status: DC | PRN
  Administered 2018-12-31: 50 mg via INTRAVENOUS
  Administered 2018-12-31: 150 mg via INTRAVENOUS

## 2018-12-31 MED ORDER — CHLORHEXIDINE GLUCONATE CLOTH 2 % EX PADS: 6.0000 | MEDICATED_PAD | Freq: Once | CUTANEOUS | Status: DC

## 2018-12-31 MED ORDER — CYCLOBENZAPRINE HCL 10 MG PO TABS: 10.0000 mg | ORAL_TABLET | Freq: Two times a day (BID) | ORAL | 0 refills | Status: DC | PRN

## 2018-12-31 MED ORDER — THROMBIN 5000 UNITS EX SOLR
OROMUCOSAL | Status: DC | PRN
  Administered 2018-12-31: 5 mL

## 2018-12-31 MED ORDER — MEPERIDINE HCL 25 MG/ML IJ SOLN: 6.2500 mg | INTRAMUSCULAR | Status: DC | PRN

## 2018-12-31 MED ORDER — SODIUM CHLORIDE 0.9 % IV SOLN
INTRAVENOUS | Status: DC | PRN
  Administered 2018-12-31: 500 mL

## 2018-12-31 MED ORDER — OXYCODONE HCL 5 MG PO TABS: 10.0000 mg | ORAL_TABLET | Freq: Once | ORAL | Status: DC

## 2018-12-31 MED ORDER — OXYCODONE HCL 5 MG PO TABS
ORAL_TABLET | ORAL | Status: AC
  Filled 2018-12-31: qty 2

## 2018-12-31 MED ORDER — ROCURONIUM BROMIDE 100 MG/10ML IV SOLN
INTRAVENOUS | Status: DC | PRN
  Administered 2018-12-31: 50 mg via INTRAVENOUS

## 2018-12-31 MED ORDER — OXYCODONE-ACETAMINOPHEN 10-325 MG PO TABS: 1.0000 | ORAL_TABLET | Freq: Four times a day (QID) | ORAL | 0 refills | Status: AC | PRN

## 2018-12-31 MED ORDER — PROMETHAZINE HCL 25 MG/ML IJ SOLN
6.2500 mg | INTRAMUSCULAR | Status: DC | PRN
  Administered 2018-12-31: 6.25 mg via INTRAVENOUS

## 2018-12-31 MED ORDER — MIDAZOLAM HCL 5 MG/5ML IJ SOLN
INTRAMUSCULAR | Status: DC | PRN
  Administered 2018-12-31: 2 mg via INTRAVENOUS

## 2018-12-31 MED ORDER — FENTANYL CITRATE (PF) 250 MCG/5ML IJ SOLN
INTRAMUSCULAR | Status: AC
  Filled 2018-12-31: qty 5

## 2018-12-31 MED ORDER — THROMBIN 5000 UNITS EX SOLR
CUTANEOUS | Status: AC
  Filled 2018-12-31: qty 5000

## 2018-12-31 MED ORDER — HYDROMORPHONE HCL 1 MG/ML IJ SOLN
INTRAMUSCULAR | Status: AC
  Administered 2018-12-31: 0.5 mg via INTRAVENOUS
  Filled 2018-12-31: qty 1

## 2018-12-31 MED ORDER — MIDAZOLAM HCL 2 MG/2ML IJ SOLN: 0.5000 mg | Freq: Once | INTRAMUSCULAR | Status: DC | PRN

## 2018-12-31 MED ORDER — SCOPOLAMINE 1 MG/3DAYS TD PT72
MEDICATED_PATCH | TRANSDERMAL | Status: DC | PRN
  Administered 2018-12-31: 1 via TRANSDERMAL

## 2018-12-31 MED ORDER — SUGAMMADEX SODIUM 200 MG/2ML IV SOLN
INTRAVENOUS | Status: DC | PRN
  Administered 2018-12-31: 200 mg via INTRAVENOUS

## 2018-12-31 MED ORDER — CEFAZOLIN SODIUM-DEXTROSE 2-4 GM/100ML-% IV SOLN
2.0000 g | INTRAVENOUS | Status: AC
  Administered 2018-12-31: 12:00:00 2 g via INTRAVENOUS

## 2018-12-31 MED ORDER — ROCURONIUM BROMIDE 10 MG/ML (PF) SYRINGE
PREFILLED_SYRINGE | INTRAVENOUS | Status: AC
  Filled 2018-12-31: qty 10

## 2018-12-31 MED ORDER — PROMETHAZINE HCL 25 MG/ML IJ SOLN
INTRAMUSCULAR | Status: AC
  Filled 2018-12-31: qty 1

## 2018-12-31 MED ORDER — BUPIVACAINE HCL (PF) 0.5 % IJ SOLN
INTRAMUSCULAR | Status: AC
  Filled 2018-12-31: qty 30

## 2018-12-31 MED ORDER — ONDANSETRON HCL 4 MG/2ML IJ SOLN
INTRAMUSCULAR | Status: AC
  Filled 2018-12-31: qty 2

## 2018-12-31 MED ORDER — HYDROMORPHONE HCL 1 MG/ML IJ SOLN
0.2500 mg | INTRAMUSCULAR | Status: DC | PRN
  Administered 2018-12-31 (×2): 0.5 mg via INTRAVENOUS

## 2018-12-31 MED ORDER — FENTANYL CITRATE (PF) 100 MCG/2ML IJ SOLN
INTRAMUSCULAR | Status: DC | PRN
  Administered 2018-12-31 (×5): 50 ug via INTRAVENOUS

## 2018-12-31 MED ORDER — 0.9 % SODIUM CHLORIDE (POUR BTL) OPTIME
TOPICAL | Status: DC | PRN
  Administered 2018-12-31: 1000 mL

## 2018-12-31 MED ORDER — LIDOCAINE 2% (20 MG/ML) 5 ML SYRINGE
INTRAMUSCULAR | Status: AC
  Filled 2018-12-31: qty 5

## 2018-12-31 MED ORDER — LIDOCAINE-EPINEPHRINE 1 %-1:100000 IJ SOLN
INTRAMUSCULAR | Status: DC | PRN
  Administered 2018-12-31: 10 mL

## 2018-12-31 MED ORDER — CEFAZOLIN SODIUM-DEXTROSE 2-4 GM/100ML-% IV SOLN
INTRAVENOUS | Status: AC
  Filled 2018-12-31: qty 100

## 2018-12-31 MED ORDER — BUPIVACAINE HCL (PF) 0.5 % IJ SOLN
INTRAMUSCULAR | Status: DC | PRN
  Administered 2018-12-31: 10 mL

## 2018-12-31 MED ORDER — METHYLPREDNISOLONE ACETATE 80 MG/ML IJ SUSP
INTRAMUSCULAR | Status: AC
  Filled 2018-12-31: qty 1

## 2018-12-31 MED ORDER — LIDOCAINE 2% (20 MG/ML) 5 ML SYRINGE
INTRAMUSCULAR | Status: DC | PRN
  Administered 2018-12-31: 40 mg via INTRAVENOUS

## 2018-12-31 SURGICAL SUPPLY — 59 items
BAG DECANTER FOR FLEXI CONT (MISCELLANEOUS) ×3 IMPLANT
BENZOIN TINCTURE PRP APPL 2/3 (GAUZE/BANDAGES/DRESSINGS) ×2 IMPLANT
BLADE CLIPPER SURG (BLADE) IMPLANT
BLADE SURG 11 STRL SS (BLADE) ×3 IMPLANT
BUR MATCHSTICK NEURO 3.0 LAGG (BURR) IMPLANT
BUR PRECISION FLUTE 5.0 (BURR) IMPLANT
CANISTER SUCT 3000ML PPV (MISCELLANEOUS) ×3 IMPLANT
CARTRIDGE OIL MAESTRO DRILL (MISCELLANEOUS) ×1 IMPLANT
CLOSURE WOUND 1/2 X4 (GAUZE/BANDAGES/DRESSINGS) ×1
COVER WAND RF STERILE (DRAPES) ×3 IMPLANT
DECANTER SPIKE VIAL GLASS SM (MISCELLANEOUS) ×3 IMPLANT
DERMABOND ADVANCED (GAUZE/BANDAGES/DRESSINGS) ×2
DERMABOND ADVANCED .7 DNX12 (GAUZE/BANDAGES/DRESSINGS) ×1 IMPLANT
DIFFUSER DRILL AIR PNEUMATIC (MISCELLANEOUS) ×3 IMPLANT
DRAPE LAPAROTOMY 100X72X124 (DRAPES) ×3 IMPLANT
DRAPE MICROSCOPE LEICA (MISCELLANEOUS) ×3 IMPLANT
DRAPE SURG 17X23 STRL (DRAPES) ×3 IMPLANT
DRSG OPSITE POSTOP 3X4 (GAUZE/BANDAGES/DRESSINGS) ×3 IMPLANT
DURAPREP 26ML APPLICATOR (WOUND CARE) ×3 IMPLANT
ELECT REM PT RETURN 9FT ADLT (ELECTROSURGICAL) ×3
ELECTRODE REM PT RTRN 9FT ADLT (ELECTROSURGICAL) ×1 IMPLANT
GAUZE 4X4 16PLY RFD (DISPOSABLE) IMPLANT
GAUZE SPONGE 4X4 12PLY STRL (GAUZE/BANDAGES/DRESSINGS) IMPLANT
GLOVE BIO SURGEON STRL SZ7 (GLOVE) IMPLANT
GLOVE BIOGEL PI IND STRL 7.0 (GLOVE) IMPLANT
GLOVE BIOGEL PI IND STRL 7.5 (GLOVE) ×1 IMPLANT
GLOVE BIOGEL PI INDICATOR 7.0 (GLOVE)
GLOVE BIOGEL PI INDICATOR 7.5 (GLOVE) ×2
GLOVE ECLIPSE 7.0 STRL STRAW (GLOVE) ×3 IMPLANT
GLOVE EXAM NITRILE XL STR (GLOVE) IMPLANT
GOWN STRL REUS W/ TWL LRG LVL3 (GOWN DISPOSABLE) ×2 IMPLANT
GOWN STRL REUS W/ TWL XL LVL3 (GOWN DISPOSABLE) IMPLANT
GOWN STRL REUS W/TWL 2XL LVL3 (GOWN DISPOSABLE) IMPLANT
GOWN STRL REUS W/TWL LRG LVL3 (GOWN DISPOSABLE) ×4
GOWN STRL REUS W/TWL XL LVL3 (GOWN DISPOSABLE)
HEMOSTAT POWDER KIT SURGIFOAM (HEMOSTASIS) ×3 IMPLANT
KIT BASIN OR (CUSTOM PROCEDURE TRAY) ×3 IMPLANT
KIT TURNOVER KIT B (KITS) ×3 IMPLANT
NDL HYPO 18GX1.5 BLUNT FILL (NEEDLE) IMPLANT
NDL SPNL 18GX3.5 QUINCKE PK (NEEDLE) IMPLANT
NEEDLE HYPO 18GX1.5 BLUNT FILL (NEEDLE) ×3 IMPLANT
NEEDLE HYPO 22GX1.5 SAFETY (NEEDLE) ×3 IMPLANT
NEEDLE SPNL 18GX3.5 QUINCKE PK (NEEDLE) IMPLANT
NS IRRIG 1000ML POUR BTL (IV SOLUTION) ×3 IMPLANT
OIL CARTRIDGE MAESTRO DRILL (MISCELLANEOUS) ×3
PACK LAMINECTOMY NEURO (CUSTOM PROCEDURE TRAY) ×3 IMPLANT
PAD ARMBOARD 7.5X6 YLW CONV (MISCELLANEOUS) ×9 IMPLANT
RUBBERBAND STERILE (MISCELLANEOUS) ×6 IMPLANT
SPONGE LAP 4X18 RFD (DISPOSABLE) IMPLANT
SPONGE SURGIFOAM ABS GEL SZ50 (HEMOSTASIS) ×3 IMPLANT
STRIP CLOSURE SKIN 1/2X4 (GAUZE/BANDAGES/DRESSINGS) ×1 IMPLANT
SUT VIC AB 0 CT1 18XCR BRD8 (SUTURE) ×1 IMPLANT
SUT VIC AB 0 CT1 8-18 (SUTURE) ×2
SUT VIC AB 2-0 CT1 18 (SUTURE) IMPLANT
SUT VICRYL 3-0 RB1 18 ABS (SUTURE) ×6 IMPLANT
SYR 3ML LL SCALE MARK (SYRINGE) ×2 IMPLANT
TOWEL GREEN STERILE (TOWEL DISPOSABLE) ×3 IMPLANT
TOWEL GREEN STERILE FF (TOWEL DISPOSABLE) ×3 IMPLANT
WATER STERILE IRR 1000ML POUR (IV SOLUTION) ×3 IMPLANT

## 2018-12-31 NOTE — Anesthesia Postprocedure Evaluation (Signed)
Anesthesia Post Note  Patient: [name] [name]  Procedure(s) Performed: MICRODISCECTOMY LUMBAR 5- SACRAL1 (Right Spine Lumbar)     Patient location during evaluation: PACU Anesthesia Type: General Level of consciousness: awake and alert, patient cooperative and oriented Pain management: pain level controlled (pain improving) Vital Signs Assessment: post-procedure vital signs reviewed and stable Respiratory status: spontaneous breathing, nonlabored ventilation and respiratory function stable Cardiovascular status: blood pressure returned to baseline and stable Postop Assessment: no apparent nausea or vomiting Anesthetic complications: no    Last Vitals:  Vitals:   12/31/18 1430 12/31/18 1450  BP: 114/72 118/69  Pulse: (!) 105 81  Resp: (!) 22 18  Temp: (!) 36.1 C   SpO2: 98% 95%    Last Pain:  Vitals:   12/31/18 1430  TempSrc:   PainSc: 6                  Olivea Sonnen,E. Artist Bloom

## 2018-12-31 NOTE — Discharge Summary (Signed)
Physician Discharge Summary  Patient ID: [name] [name] MRN: [id number] DOB/AGE: [birth year] 38 y.o.  Admit date: 12/31/2018 Discharge date: 12/31/2018  Admission Diagnoses: Lumbar disc herniation with radiculopathy, L5-S1  Discharge Diagnoses: Same Active Problems:   * No active hospital problems. *   Discharged Condition: Stable  Hospital Course:  Mrs. [name] [name] is a 38 y.o. female who presented to the clinic with S1 radiculopathy and MRI demonstrating large L5-S1 disc herniation. She underwent elective right L5-S1 laminotomy and microdiscectomy which was done without complication. Postoperatively she was at her neurologic baseline. Back pain was controlled with oral medication, she was ambulating without difficulty, voiding normally, and tolerating diet.  Treatments: Surgery - right L5-S1 laminotomy, microdiscectomy  Discharge Exam: Blood pressure 135/82, pulse 92, temperature 98.5 F (36.9 C), temperature source Oral, resp. rate 20, height 5\' 2"  (1.575 m), weight 64.4 kg, last menstrual period 05/09/2013, SpO2 100 %. Awake, alert, oriented Speech fluent, appropriate CN grossly intact 5/5 BUE/BLE Wound c/d/i  Disposition: Discharge disposition: 01-Home or Self Care       Discharge Instructions    Call MD for:  redness, tenderness, or signs of infection (pain, swelling, redness, odor or green/yellow discharge around incision site)   Complete by: As directed    Call MD for:  temperature >100.4   Complete by: As directed    Diet - low sodium heart healthy   Complete by: As directed    Discharge instructions   Complete by: As directed    Walk at home as much as possible, at least 4 times / day   Increase activity slowly   Complete by: As directed    Lifting restrictions   Complete by: As directed    No lifting > 10 lbs   May shower / Bathe   Complete by: As directed    48 hours after surgery   May walk up steps   Complete by: As directed    No  dressing needed   Complete by: As directed    Other Restrictions   Complete by: As directed    No bending/twisting at waist     Allergies as of 12/31/2018      Reactions   Duloxetine Hcl Nausea And Vomiting   Nitrofurantoin Monohyd Macro Hives, Shortness Of Breath   Aspirin    Bleeding ulcer   Morphine And Related Nausea And Vomiting      Medication List    STOP taking these medications   acetaminophen 325 MG tablet Commonly known as: TYLENOL   methocarbamol 750 MG tablet Commonly known as: Robaxin-750   predniSONE 10 MG (21) Tbpk tablet Commonly known as: STERAPRED UNI-PAK 21 TAB   traMADol 50 MG tablet Commonly known as: ULTRAM     TAKE these medications   albuterol 108 (90 Base) MCG/ACT inhaler Commonly known as: VENTOLIN HFA Inhale 1-2 puffs into the lungs every 6 (six) hours as needed for wheezing or shortness of breath.   buPROPion 150 MG 24 hr tablet Commonly known as: WELLBUTRIN XL Take 150 mg by mouth daily.   busPIRone 5 MG tablet Commonly known as: BUSPAR Take 5 mg by mouth daily as needed (panic attacks).   cyclobenzaprine 10 MG tablet Commonly known as: FLEXERIL Take 1 tablet (10 mg total) by mouth 2 (two) times daily as needed for muscle spasms.   Fluticasone-Salmeterol 500-50 MCG/DOSE Aepb Commonly known as: ADVAIR Inhale 1 puff into the lungs 2 (two) times daily.   omeprazole 40 MG capsule Commonly  known as: PRILOSEC Take 40 mg by mouth daily.   oxyCODONE-acetaminophen 10-325 MG tablet Commonly known as: Percocet Take 1 tablet by mouth every 6 (six) hours as needed for up to 7 days for pain.      Follow-up Information    Consuella Lose, MD In 3 weeks.   Specialty: Neurosurgery Contact information: [address] [place] [address] [phone]           Signed: Jairo Ben 12/31/2018, 1:08 PM

## 2018-12-31 NOTE — H&P (Signed)
Chief Complaint   Back pain  HPI   HPI: [name] [name] is a 38 y.o. female with chronic lower back pain as well as right lower extremity pain.   She reports a constant 7/10 pain in her low back with radiation into her right buttocks and into her right calf.  She denies left lower extremity symptoms.  She denies bowel or bladder dysfunction.She underwent an MRI of her lumbar spine which revealed a large right eccentric L5-S1 disc herniation.  She presents today for surgery.  She is without any concerns.   Patient Active Problem List   Diagnosis Date Noted  . Pelvic pain in female 08/26/2017  . Pelvic peritoneal adhesions, female 08/26/2017  . Endometriosis of pelvis 08/26/2017  . Unilateral primary osteoarthritis, right knee 06/09/2017  . Unilateral primary osteoarthritis, left knee 06/09/2017  . Family history of breast cancer in mother 12/06/2015  . Menorrhagia 05/31/2013  . Dysmenorrhea 05/31/2013  . Dyspareunia 05/31/2013  . Knee pain 08/24/2012  . Asthma 08/05/2011    PMH: Past Medical History:  Diagnosis Date  . Allergy   . Arthritis    right knee  . Asthma   . Constipation   . Depression   . GERD (gastroesophageal reflux disease)   . History of kidney stones   . Panic disorder   . Renal disorder   . Seasonal allergies   . SVD (spontaneous vaginal delivery)    x 3    PSH: Past Surgical History:  Procedure Laterality Date  . ABDOMINAL HYSTERECTOMY    . adiana    . BILATERAL SALPINGECTOMY Bilateral 05/31/2013   Procedure: BILATERAL SALPINGECTOMY;  Surgeon: Cheri Fowler, MD;  Location: [facility];  Service: Gynecology;  Laterality: Bilateral;  . BREAST CYST ASPIRATION Right 2015  . COLONOSCOPY    . DILATION AND CURETTAGE OF UTERUS    . DNC    . KIDNEY STONE SURGERY    . KNEE ARTHROSCOPY Right   . KNEE ARTHROSCOPY WITH DRILLING/MICROFRACTURE Right 07/26/2014   Procedure: RIGHT KNEE ARTHROSCOPY WITH CHONDROPLASTY, MICROFRACTURE;  Surgeon: Marianna Payment, MD;  Location: [facility];  Service: Orthopedics;  Laterality: Right;  . KNEE ARTHROSCOPY WITH MEDIAL MENISECTOMY Right 07/26/2014   Procedure: KNEE ARTHROSCOPY WITH MEDIAL MENISECTOMY;  Surgeon: Marianna Payment, MD;  Location: [facility];  Service: Orthopedics;  Laterality: Right;  . LAPAROSCOPIC ASSISTED VAGINAL HYSTERECTOMY N/A 05/31/2013   Procedure: LAPAROSCOPIC ASSISTED VAGINAL HYSTERECTOMY;  Surgeon: Cheri Fowler, MD;  Location: [facility];  Service: Gynecology;  Laterality: N/A;  . LAPAROSCOPY N/A 08/26/2017   Procedure: LAPAROSCOPY OPERATIVE WITH PERIOTONEAL BIOPSY AND FULGERATION OF ENDOMETRIOSIS;  Surgeon: Cheri Fowler, MD;  Location: [facility];  Service: Gynecology;  Laterality: N/A;  . PARTIAL HYSTERECTOMY    . TONSILLECTOMY    . TONSILLECTOMY AND ADENOIDECTOMY    . TUBAL LIGATION    . UPPER GI ENDOSCOPY     ulcer    Facility-Administered Medications Prior to Admission  Medication Dose Route Frequency Provider Last Rate Last Dose  . diclofenac sodium (VOLTAREN) 1 % transdermal gel 2 g  2 g Topical QID PRN Aundra Dubin, PA-C       Medications Prior to Admission  Medication Sig Dispense Refill Last Dose  . acetaminophen (TYLENOL) 325 MG tablet Take 650 mg by mouth every 6 (six) hours as needed for moderate pain or headache.   12/31/2018 at 0730  . albuterol (PROVENTIL HFA;VENTOLIN HFA) 108 (90 BASE) MCG/ACT inhaler Inhale 1-2  puffs into the lungs every 6 (six) hours as needed for wheezing or shortness of breath. 1 Inhaler 0 12/31/2018 at 0700  . buPROPion (WELLBUTRIN XL) 150 MG 24 hr tablet Take 150 mg by mouth daily.   12/30/2018 at Unknown time  . busPIRone (BUSPAR) 5 MG tablet Take 5 mg by mouth daily as needed (panic attacks).    12/30/2018 at Unknown time  . Fluticasone-Salmeterol (ADVAIR) 500-50 MCG/DOSE AEPB Inhale 1 puff into the lungs 2 (two) times daily.   12/31/2018 at Unknown time  . omeprazole (PRILOSEC) 40 MG capsule Take 40  mg by mouth daily.   12/30/2018 at Unknown time  . traMADol (ULTRAM) 50 MG tablet Take 50 mg by mouth every 6 (six) hours as needed for moderate pain.   Past Week at Unknown time  . methocarbamol (ROBAXIN-750) 750 MG tablet Take 1 tablet (750 mg total) by mouth 3 (three) times daily. (Patient not taking: Reported on 12/23/2018) 30 tablet 0 Not Taking at Unknown time  . predniSONE (STERAPRED UNI-PAK 21 TAB) 10 MG (21) TBPK tablet Take by mouth daily. Take as directed (Patient not taking: Reported on 12/23/2018) 21 tablet 0 Not Taking at Unknown time    SH: Social History   Tobacco Use  . Smoking status: Never Smoker  . Smokeless tobacco: Never Used  Substance Use Topics  . Alcohol use: No    Alcohol/week: 0.0 standard drinks    Frequency: Never  . Drug use: No    MEDS: Prior to Admission medications   Medication Sig Start Date End Date Taking? Authorizing Provider  acetaminophen (TYLENOL) 325 MG tablet Take 650 mg by mouth every 6 (six) hours as needed for moderate pain or headache.    [provider]  albuterol (PROVENTIL HFA;VENTOLIN HFA) 108 (90 BASE) MCG/ACT inhaler Inhale 1-2 puffs into the lungs every 6 (six) hours as needed for wheezing or shortness of breath. 12/11/13   Bjorn Pippin, PA-C  buPROPion (WELLBUTRIN XL) 150 MG 24 hr tablet Take 150 mg by mouth daily.    [provider]  busPIRone (BUSPAR) 5 MG tablet Take 5 mg by mouth daily as needed (panic attacks).     [provider]  Fluticasone-Salmeterol (ADVAIR) 500-50 MCG/DOSE AEPB Inhale 1 puff into the lungs 2 (two) times daily.    [provider]  methocarbamol (ROBAXIN-750) 750 MG tablet Take 1 tablet (750 mg total) by mouth 3 (three) times daily. Patient not taking: Reported on 12/23/2018 05/09/18   Wieters, Hallie C, PA-C  omeprazole (PRILOSEC) 40 MG capsule Take 40 mg by mouth daily.    [provider]  predniSONE (STERAPRED UNI-PAK 21 TAB) 10 MG (21) TBPK tablet Take by mouth  daily. Take as directed Patient not taking: Reported on 12/23/2018 05/09/18   Wieters, Hallie C, PA-C  traMADol (ULTRAM) 50 MG tablet Take 50 mg by mouth every 6 (six) hours as needed for moderate pain.    [provider]    ALLERGY: Allergies  Allergen Reactions  . Duloxetine Hcl Nausea And Vomiting  . Nitrofurantoin Monohyd Macro Hives and Shortness Of Breath  . Aspirin     Bleeding ulcer  . Morphine And Related Nausea And Vomiting    Social History   Tobacco Use  . Smoking status: Never Smoker  . Smokeless tobacco: Never Used  Substance Use Topics  . Alcohol use: No    Alcohol/week: 0.0 standard drinks    Frequency: Never     Family History  Problem  Relation Age of Onset  . Hypertension Mother   . Diabetes Mother   . Breast cancer Mother 32       IDC; s/p lumpectomy, radiation, and arimidex  . Prostate cancer Paternal Grandfather        dx. 50s-60s  . Other Sister        both full sisters also have dense breast tissue  . Breast cancer Maternal Grandmother 83       IDC  . Colon polyps Maternal Grandmother        approx 4-5 colon polyps  . Pancreatic cancer Other        maternal great aunt (MGF's sister) d. pancreatic cancer  . Cancer Other        maternal great aunt (MGF's sister) dx NOS cancer  . Cancer Other        maternal great grandmother (MGF's mother) d. NOS cancer in her 69s  . Cancer Cousin 25       paternal 1st cousin dx. w/ either throat or thyroid cancer; not a smoker  . Breast cancer Other        paternal great aunt (PGF's sister) dx. breast cancer, 63s  . Colon cancer Neg Hx   . Rectal cancer Neg Hx   . Stomach cancer Neg Hx   . Esophageal cancer Neg Hx      ROS   ROS  Exam   Vitals:   12/31/18 0941  BP: 135/82  Pulse: 92  Resp: 20  Temp: 98.5 F (36.9 C)  SpO2: 100%   General appearance: WDWN, NAD Eyes: No scleral injection Cardiovascular: Regular rate and rhythm without murmurs, rubs, gallops. No edema or variciosities.  Distal pulses normal. Pulmonary: Effort normal, non-labored breathing Musculoskeletal:     Muscle tone upper extremities: Normal    Muscle tone lower extremities: Normal    Motor exam: Upper Extremities Deltoid Bicep Tricep Grip  Right 5/5 5/5 5/5 5/5  Left 5/5 5/5 5/5 5/5   Lower Extremity IP Quad PF DF EHL  Right 5/5 5/5 5/5 5/5 5/5  Left 5/5 5/5 5/5 5/5 5/5   Neurological Mental Status:    - Patient is awake, alert, oriented to person, place, month, year, and situation    - Patient is able to give a clear and coherent history.    - No signs of aphasia or neglect Cranial Nerves    - II: Visual Fields are full. PERRL    - III/IV/VI: EOMI without ptosis or diploplia.     - V: Facial sensation is grossly normal    - VII: Facial movement is symmetric.     - VIII: hearing is intact to voice    - X: Uvula elevates symmetrically    - XI: Shoulder shrug is symmetric.    - XII: tongue is midline without atrophy or fasciculations.  Sensory: Sensation grossly intact to LT  Results - Imaging/Labs   No results found for this or any previous visit (from the past 48 hour(s)).  No results found.  IMAGING: MRI of the lumbar spine dated 11/18/2018 was reviewed.  This does demonstrate maintenance of normal lumbar lordosis.  Primary findings at L5-S1 where there is disc desiccation with some loss of height.  There is reactive endplate change.  There is a fairly broad-based right eccentric disc herniation with resultant significant lateral recess stenosis and compression of the traversing right S1 nerve root.   Impression/Plan   38 y.o. female with chronic back and more recent  right-sided leg pain related to large right eccentric L5-S1 disc herniation.  Today she c/o left-sided leg pain as well. We discussed options and I have decided to proceed with right-sided laminotomy/microdisc. Should there be minimal findings intraoperatively we discussed doing left-sided laminotomy as well, however if  there is in fact large disc herniation on the right we will leave the left side alone. She understands that in this scenario there is a possibility that she would need repeat surgery on the left if pain is not significantly changed. The risks of surgery were previously reviewed in detail in the office. All her questions today were answered and consent was obtained.

## 2018-12-31 NOTE — Transfer of Care (Signed)
Immediate Anesthesia Transfer of Care Note  Patient: [name] [name]  Procedure(s) Performed: MICRODISCECTOMY LUMBAR 5- SACRAL1 (Right Spine Lumbar)  Patient Location: PACU  Anesthesia Type:General  Level of Consciousness: awake, alert  and oriented  Airway & Oxygen Therapy: Patient Spontanous Breathing and Patient connected to face mask oxygen  Post-op Assessment: Report given to RN and Post -op Vital signs reviewed and stable  Post vital signs: Reviewed and stable  Last Vitals:  Vitals Value Taken Time  BP 118/76 12/31/18 1313  Temp    Pulse 95 12/31/18 1316  Resp 27 12/31/18 1315  SpO2 100 % 12/31/18 1316  Vitals shown include unvalidated device data.  Last Pain:  Vitals:   12/31/18 1020  TempSrc:   PainSc: 9          Complications: No apparent anesthesia complications

## 2018-12-31 NOTE — Anesthesia Preprocedure Evaluation (Addendum)
Anesthesia Evaluation  Patient identified by MRN, date of birth, ID band Patient awake    Reviewed: Allergy & Precautions, NPO status , Patient's Chart, lab work & pertinent test results  History of Anesthesia Complications Negative for: history of anesthetic complications  Airway Mallampati: II  TM Distance: >3 FB Neck ROM: Full    Dental  (+) Dental Advisory Given   Pulmonary COPD,  COPD inhaler,  12/28/2018 SARS coronavirus NEG   breath sounds clear to auscultation       Cardiovascular negative cardio ROS   Rhythm:Regular Rate:Normal     Neuro/Psych Anxiety Depression Chronic back and leg pain: tramadol    GI/Hepatic Neg liver ROS, GERD  Medicated and Controlled,  Endo/Other  negative endocrine ROS  Renal/GU negative Renal ROS     Musculoskeletal   Abdominal   Peds  Hematology negative hematology ROS (+)   Anesthesia Other Findings   Reproductive/Obstetrics S/p BTL                            Anesthesia Physical Anesthesia Plan  ASA: II  Anesthesia Plan: General   Post-op Pain Management:    Induction: Intravenous  PONV Risk Score and Plan: 4 or greater and Scopolamine patch - Pre-op, Dexamethasone and Ondansetron  Airway Management Planned: Oral ETT  Additional Equipment:   Intra-op Plan:   Post-operative Plan: Extubation in OR  Informed Consent: I have reviewed the patients History and Physical, chart, labs and discussed the procedure including the risks, benefits and alternatives for the proposed anesthesia with the patient or authorized representative who has indicated his/her understanding and acceptance.     Dental advisory given  Plan Discussed with: CRNA and Surgeon  Anesthesia Plan Comments:         Anesthesia Quick Evaluation

## 2018-12-31 NOTE — Op Note (Signed)
°  NEUROSURGERY OPERATIVE NOTE   PREOP DIAGNOSIS: Lumbar disc herniation, L5-S1  POSTOP DIAGNOSIS: Same  PROCEDURE: 1. Right L5-S1 laminotomy and microdiscectomy for decompression of nerve root 2. Use of operating microscope  SURGEON: Dr. Consuella Lose, MD  ASSISTANT: Dr. Emelda Brothers, MD  ANESTHESIA: General Endotracheal  EBL: 50cc  SPECIMENS: None  DRAINS: None  COMPLICATIONS: None immediate  CONDITION: Hemodynamically stable to PACU  HISTORY: [name] [name] is a 38 y.o. female initially seen in the outpatient neurosurgery clinic with primarily right-sided leg pain and MRI demonstrating central to slightly right eccentric disc herniation at L5-S1.  She did not have any resolution of her symptoms with reasonable conservative treatment and elected to undergo right-sided laminotomy and microdiscectomy.  Over the last few days she has noted progressively worsening left-sided leg pain.  After lengthy discussion in the preoperative area, we elected to proceed with right-sided microdiscectomy based on MRI findings, with plan to explore the left side if no large disc herniation was found on the right.  We did also discuss the possibility of additional surgery in the future if she did not have some improvement in her symptoms.  After all her questions were answered informed consent was obtained and witnessed.  PROCEDURE IN DETAIL: After informed consent was obtained and witnessed, the patient was brought to the operating room. After induction of general anesthesia, the patient was positioned on the operative table in the prone position with all pressure points meticulously padded. The skin of the low back was then prepped and draped in the usual sterile fashion.  Under x-ray, the correct level was identified and marked out on the skin, and after timeout was conducted, the skin was infiltrated with local anesthetic. Skin incision was then made sharply and Bovie electrocautery was  used to dissect the subcutaneous tissue until the lumbodorsal fascia was identified. The fascia was then incised using Bovie electrocautery and the lamina at the L5 and S1 levels was identified and dissection was carried out in the subperiosteal plane. Self-retaining retractor was then placed, and intraoperative x-ray was taken with a dissector underneath the L5 lamina to confirm we were at the correct level.  Using a high-speed drill, laminotomy was completed with a partial medial facetectomy. The ligamentum flavum was then identified and removed and the lateral edge of the thecal sac was identified. This was then traced down to identify the traversing nerve root. Dissection was then carried out along the lateral edge of the thecal sac.  A large subligamentous disc herniation was identified.  The posterior longitudinal ligament and posterior annulus were incised.  Using ball-tipped dissectors, multiple large disc fragments were removed.  Once this was done, I was able to pass a long ball-tipped dissector within the ventral epidural space to the contralateral side.  This indicated good decompression.  I therefore elected not to proceed with a left-sided exploration.  Hemostasis was then secured using a combination of morcellized Gelfoam and thrombin and bipolar electrocautery. The wound is irrigated with copious amounts of antibiotic saline irrigation. The nerve root was then covered with a long-acting steroid solution. Self-retaining retractor was then removed, and the wound is closed in layers using a combination of interrupted 0 Vicryl and 3-0 Vicryl stitches. The skin was closed using standard skin glue.  At the end of the case all sponge, needle, and instrument counts were correct. The patient was then transferred to the stretcher and taken to the postanesthesia care unit in stable hemodynamic condition.

## 2018-12-31 NOTE — Anesthesia Procedure Notes (Signed)
Procedure Name: Intubation Date/Time: 12/31/2018 11:46 AM Performed by: Gwyndolyn Saxon, CRNA Pre-anesthesia Checklist: Patient identified, Emergency Drugs available, Suction available and Patient being monitored Patient Re-evaluated:Patient Re-evaluated prior to induction Oxygen Delivery Method: Circle system utilized Preoxygenation: Pre-oxygenation with 100% oxygen Induction Type: IV induction Ventilation: Mask ventilation without difficulty Laryngoscope Size: Miller and 2 Grade View: Grade I Tube type: Oral Tube size: 7.0 mm Number of attempts: 1 Airway Equipment and Method: Patient positioned with wedge pillow and Stylet Placement Confirmation: ETT inserted through vocal cords under direct vision,  positive ETCO2 and breath sounds checked- equal and bilateral Secured at: 20 cm Tube secured with: Tape Dental Injury: Teeth and Oropharynx as per pre-operative assessment

## 2019-01-01 ENCOUNTER — Encounter (HOSPITAL_COMMUNITY): Payer: Self-pay | Admitting: Neurosurgery

## 2019-02-02 ENCOUNTER — Telehealth: Payer: Medicaid Other | Admitting: Nurse Practitioner

## 2019-02-02 DIAGNOSIS — J45909 Unspecified asthma, uncomplicated: Secondary | ICD-10-CM

## 2019-02-02 DIAGNOSIS — Z20822 Contact with and (suspected) exposure to covid-19: Secondary | ICD-10-CM

## 2019-02-02 DIAGNOSIS — R0981 Nasal congestion: Secondary | ICD-10-CM

## 2019-02-02 DIAGNOSIS — R05 Cough: Secondary | ICD-10-CM

## 2019-02-02 DIAGNOSIS — R059 Cough, unspecified: Secondary | ICD-10-CM

## 2019-02-02 MED ORDER — ALBUTEROL SULFATE HFA 108 (90 BASE) MCG/ACT IN AERS: 2.0000 | INHALATION_SPRAY | Freq: Four times a day (QID) | RESPIRATORY_TRACT | 0 refills | Status: AC | PRN

## 2019-02-02 NOTE — Progress Notes (Signed)
E-Visit for Corona Virus Screening   Your current symptoms could be consistent with the coronavirus.  Many health care providers can now test patients at their office but not all are.  [facility] has multiple testing sites. For information on our COVID testing locations and hours go to [email or web address]  Please quarantine yourself while awaiting your test results.  We are enrolling you in our [facility] for [place] . Daily you will receive a questionnaire within the [facility] website. Our COVID 19 response team willl be monitoriing your responses daily.  * you no longer need an order or an appointment to be corona tested. Just open the web site above and you will be able to see the locations and times for testing.  COVID-19 is a respiratory illness with symptoms that are similar to the flu. Symptoms are typically mild to moderate, but there have been cases of severe illness and death due to the virus. The following symptoms may appear 2-14 days after exposure: . Fever . Cough . Shortness of breath or difficulty breathing . Chills . Repeated shaking with chills . Muscle pain . Headache . Sore throat . New loss of taste or smell . Fatigue . Congestion or runny nose . Nausea or vomiting . Diarrhea  It is vitally important that if you feel that you have an infection such as this virus or any other virus that you stay home and away from places where you may spread it to others.  You should self-quarantine for 14 days if you have symptoms that could potentially be coronavirus or have been in close contact a with a person diagnosed with COVID-19 within the last 2 weeks. You should avoid contact with people age 10 and older.   You should wear a mask or cloth face covering over your nose and mouth if you must be around other people or animals, including pets (even at home). Try to stay at least 6 feet away from other people. This will protect the people around  you.  You can use medication such as A prescription inhaler called Albuterol MDI 90 mcg /actuation 2 puffs every 4 hours as needed for shortness of breath, wheezing, cough  You may also take acetaminophen (Tylenol) as needed for fever.   Reduce your risk of any infection by using the same precautions used for avoiding the common cold or flu:  Marland Kitchen Wash your hands often with soap and warm water for at least 20 seconds.  If soap and water are not readily available, use an alcohol-based hand sanitizer with at least 60% alcohol.  . If coughing or sneezing, cover your mouth and nose by coughing or sneezing into the elbow areas of your shirt or coat, into a tissue or into your sleeve (not your hands). . Avoid shaking hands with others and consider head nods or verbal greetings only. . Avoid touching your eyes, nose, or mouth with unwashed hands.  . Avoid close contact with people who are sick. . Avoid places or events with large numbers of people in one location, like concerts or sporting events. . Carefully consider travel plans you have or are making. . If you are planning any travel outside or inside the [place], visit the CDC's Travelers' Health webpage for the latest health notices. . If you have some symptoms but not all symptoms, continue to monitor at home and seek medical attention if your symptoms worsen. . If you are having a medical emergency, call 911.  HOME CARE .  Only take medications as instructed by your medical team. . Drink plenty of fluids and get plenty of rest. . A steam or ultrasonic humidifier can help if you have congestion.   GET HELP RIGHT AWAY IF YOU HAVE EMERGENCY WARNING SIGNS** FOR COVID-19. If you or someone is showing any of these signs seek emergency medical care immediately. Call 911 or proceed to your closest emergency facility if: . You develop worsening high fever. . Trouble breathing . Bluish lips or face . Persistent pain or pressure in the chest . New  confusion . Inability to wake or stay awake . You cough up blood. . Your symptoms become more severe  **This list is not all possible symptoms. Contact your medical provider for any symptoms that are sever or concerning to you.   MAKE SURE YOU   Understand these instructions.  Will watch your condition.  Will get help right away if you are not doing well or get worse.  Your e-visit answers were reviewed by a board certified advanced clinical practitioner to complete your personal care plan.  Depending on the condition, your plan could have included both over the counter or prescription medications.  If there is a problem please reply once you have received a response from your provider.  Your safety is important to [place].  If you have drug allergies check your prescription carefully.    You can use MyChart to ask questions about today's visit, request a non-urgent call back, or ask for a work or school excuse for 24 hours related to this e-Visit. If it has been greater than 24 hours you will need to follow up with your provider, or enter a new e-Visit to address those concerns. You will get an e-mail in the next two days asking about your experience.  I hope that your e-visit has been valuable and will speed your recovery. Thank you for using e-visits.  5-10 minutes spent reviewing and documenting in chart.

## 2019-02-27 ENCOUNTER — Other Ambulatory Visit: Payer: Self-pay

## 2019-02-27 ENCOUNTER — Encounter (HOSPITAL_COMMUNITY): Payer: Self-pay | Admitting: Emergency Medicine

## 2019-02-27 ENCOUNTER — Emergency Department (HOSPITAL_COMMUNITY)
Admission: EM | Admit: 2019-02-27 | Discharge: 2019-02-27 | Disposition: A | Discharge status: Home / Self Care | Payer: Medicaid Other | Attending: Emergency Medicine | Admitting: Emergency Medicine

## 2019-02-27 DIAGNOSIS — M5441 Lumbago with sciatica, right side: Secondary | ICD-10-CM | POA: Insufficient documentation

## 2019-02-27 DIAGNOSIS — M5442 Lumbago with sciatica, left side: Secondary | ICD-10-CM | POA: Insufficient documentation

## 2019-02-27 DIAGNOSIS — M545 Low back pain: Secondary | ICD-10-CM | POA: Diagnosis present

## 2019-02-27 DIAGNOSIS — J45909 Unspecified asthma, uncomplicated: Secondary | ICD-10-CM | POA: Diagnosis not present

## 2019-02-27 DIAGNOSIS — Z79899 Other long term (current) drug therapy: Secondary | ICD-10-CM | POA: Diagnosis not present

## 2019-02-27 MED ORDER — CYCLOBENZAPRINE HCL 10 MG PO TABS
5.0000 mg | ORAL_TABLET | Freq: Once | ORAL | Status: AC
  Administered 2019-02-27: 19:00:00 5 mg via ORAL
  Filled 2019-02-27: qty 1

## 2019-02-27 MED ORDER — NAPROXEN 500 MG PO TABS: 500.0000 mg | ORAL_TABLET | Freq: Two times a day (BID) | ORAL | 0 refills | Status: DC

## 2019-02-27 MED ORDER — DIAZEPAM 5 MG PO TABS
5.0000 mg | ORAL_TABLET | Freq: Once | ORAL | Status: AC
  Administered 2019-02-27: 22:00:00 5 mg via ORAL
  Filled 2019-02-27: qty 1

## 2019-02-27 MED ORDER — OXYCODONE-ACETAMINOPHEN 5-325 MG PO TABS
1.0000 | ORAL_TABLET | Freq: Once | ORAL | Status: AC
  Administered 2019-02-27: 19:00:00 1 via ORAL
  Filled 2019-02-27: qty 1

## 2019-02-27 MED ORDER — KETOROLAC TROMETHAMINE 30 MG/ML IJ SOLN
30.0000 mg | Freq: Once | INTRAMUSCULAR | Status: AC
  Administered 2019-02-27: 19:00:00 30 mg via INTRAMUSCULAR
  Filled 2019-02-27: qty 1

## 2019-02-27 MED ORDER — LIDOCAINE 5 % EX PTCH
1.0000 | MEDICATED_PATCH | CUTANEOUS | Status: DC
  Administered 2019-02-27: 19:00:00 1 via TRANSDERMAL
  Filled 2019-02-27: qty 1

## 2019-02-27 NOTE — ED Provider Notes (Signed)
[facility] Provider Note   CSN: [id number] Arrival date & time: 02/27/19  1755     History   Chief Complaint Chief Complaint  Patient presents with  . Back Pain    HPI [name] [name] is a 38 y.o. female with recent L5-S1 microdiscectomy on 12/31/2018 presents to the ED complaining of lower back pain that started this morning when she woke up.  Patient reports she went to bed with minimal back pain.  She reports her pain has been gradually improving since her surgery until this morning.  She denies any trauma or falls that she knows of.  She reports her pain is severe, located in the midline of her lumbar spine, and radiates to her bilateral hips.  She denies any numbness, tingling, or focal weakness.  She denies any bladder or bowel incontinence.  She denies any fevers.  She reports the pain feels like a tightness that is worse with movement.  She reports having similar symptoms 2 weeks ago at which time she called her neurosurgeon and was prescribed Flexeril with some improvement of her symptoms.  She reports that today's pain is worse.     The history is provided by the patient.    Past Medical History:  Diagnosis Date  . Allergy   . Arthritis    right knee  . Asthma   . Constipation   . Depression   . GERD (gastroesophageal reflux disease)   . History of kidney stones   . Panic disorder   . Renal disorder   . Seasonal allergies   . SVD (spontaneous vaginal delivery)    x 3    Patient Active Problem List   Diagnosis Date Noted  . Pelvic pain in female 08/26/2017  . Pelvic peritoneal adhesions, female 08/26/2017  . Endometriosis of pelvis 08/26/2017  . Unilateral primary osteoarthritis, right knee 06/09/2017  . Unilateral primary osteoarthritis, left knee 06/09/2017  . Family history of breast cancer in mother 12/06/2015  . Menorrhagia 05/31/2013  . Dysmenorrhea 05/31/2013  . Dyspareunia 05/31/2013  . Knee pain  08/24/2012  . Asthma 08/05/2011    Past Surgical History:  Procedure Laterality Date  . ABDOMINAL HYSTERECTOMY    . adiana    . BILATERAL SALPINGECTOMY Bilateral 05/31/2013   Procedure: BILATERAL SALPINGECTOMY;  Surgeon: Cheri Fowler, MD;  Location: [facility];  Service: Gynecology;  Laterality: Bilateral;  . BREAST CYST ASPIRATION Right 2015  . COLONOSCOPY    . DILATION AND CURETTAGE OF UTERUS    . DNC    . KIDNEY STONE SURGERY    . KNEE ARTHROSCOPY Right   . KNEE ARTHROSCOPY WITH DRILLING/MICROFRACTURE Right 07/26/2014   Procedure: RIGHT KNEE ARTHROSCOPY WITH CHONDROPLASTY, MICROFRACTURE;  Surgeon: Marianna Payment, MD;  Location: [facility];  Service: Orthopedics;  Laterality: Right;  . KNEE ARTHROSCOPY WITH MEDIAL MENISECTOMY Right 07/26/2014   Procedure: KNEE ARTHROSCOPY WITH MEDIAL MENISECTOMY;  Surgeon: Marianna Payment, MD;  Location: [facility];  Service: Orthopedics;  Laterality: Right;  . LAPAROSCOPIC ASSISTED VAGINAL HYSTERECTOMY N/A 05/31/2013   Procedure: LAPAROSCOPIC ASSISTED VAGINAL HYSTERECTOMY;  Surgeon: Cheri Fowler, MD;  Location: [facility];  Service: Gynecology;  Laterality: N/A;  . LAPAROSCOPY N/A 08/26/2017   Procedure: LAPAROSCOPY OPERATIVE WITH PERIOTONEAL BIOPSY AND FULGERATION OF ENDOMETRIOSIS;  Surgeon: Cheri Fowler, MD;  Location: [facility];  Service: Gynecology;  Laterality: N/A;  . LUMBAR LAMINECTOMY/DECOMPRESSION MICRODISCECTOMY Right 12/31/2018   Procedure: MICRODISCECTOMY LUMBAR 5- SACRAL1;  Surgeon: Kathyrn Sheriff,  Nena Polio, MD;  Location: [facility];  Service: Neurosurgery;  Laterality: Right;  MICRODISCECTOMY LUMBAR 5- SACRAL1  . PARTIAL HYSTERECTOMY    . TONSILLECTOMY    . TONSILLECTOMY AND ADENOIDECTOMY    . TUBAL LIGATION    . UPPER GI ENDOSCOPY     ulcer     OB History   No obstetric history on file.      Home Medications    Prior to Admission medications   Medication Sig Start Date End Date Taking? Authorizing  Provider  albuterol (VENTOLIN HFA) 108 (90 Base) MCG/ACT inhaler Inhale 2 puffs into the lungs every 6 (six) hours as needed for wheezing or shortness of breath. 02/02/19   Hassell Done, Mary-Margaret, FNP  buPROPion (WELLBUTRIN XL) 150 MG 24 hr tablet Take 150 mg by mouth daily.    [provider]  busPIRone (BUSPAR) 5 MG tablet Take 5 mg by mouth daily as needed (panic attacks).     [provider]  cyclobenzaprine (FLEXERIL) 10 MG tablet Take 1 tablet (10 mg total) by mouth 2 (two) times daily as needed for muscle spasms. 12/31/18   Consuella Lose, MD  Fluticasone-Salmeterol (ADVAIR) 500-50 MCG/DOSE AEPB Inhale 1 puff into the lungs 2 (two) times daily.    [provider]  naproxen (NAPROSYN) 500 MG tablet Take 1 tablet (500 mg total) by mouth 2 (two) times daily. 02/27/19   Candie Chroman, MD  omeprazole (PRILOSEC) 40 MG capsule Take 40 mg by mouth daily.    [provider]    Family History Family History  Problem Relation Age of Onset  . Hypertension Mother   . Diabetes Mother   . Breast cancer Mother 32       IDC; s/p lumpectomy, radiation, and arimidex  . Prostate cancer Paternal Grandfather        dx. 50s-60s  . Other Sister        both full sisters also have dense breast tissue  . Breast cancer Maternal Grandmother 83       IDC  . Colon polyps Maternal Grandmother        approx 4-5 colon polyps  . Pancreatic cancer Other        maternal great aunt (MGF's sister) d. pancreatic cancer  . Cancer Other        maternal great aunt (MGF's sister) dx NOS cancer  . Cancer Other        maternal great grandmother (MGF's mother) d. NOS cancer in her 4s  . Cancer Cousin 25       paternal 1st cousin dx. w/ either throat or thyroid cancer; not a smoker  . Breast cancer Other        paternal great aunt (PGF's sister) dx. breast cancer, 11s  . Colon cancer Neg Hx   . Rectal cancer Neg Hx   . Stomach cancer Neg Hx   . Esophageal cancer Neg Hx     Social  History Social History   Tobacco Use  . Smoking status: Never Smoker  . Smokeless tobacco: Never Used  Substance Use Topics  . Alcohol use: No    Alcohol/week: 0.0 standard drinks    Frequency: Never  . Drug use: No     Allergies   Duloxetine hcl, Nitrofurantoin monohyd macro, Aspirin, and Morphine and related   Review of Systems Review of Systems  Constitutional: Negative for chills and fever.  HENT: Negative for ear pain and sore throat.   Eyes: Negative for pain and visual disturbance.  Respiratory: Negative for cough and shortness of breath.   Cardiovascular: Negative for chest pain and palpitations.  Gastrointestinal: Negative for abdominal pain and vomiting.  Genitourinary: Negative for dysuria and hematuria.  Musculoskeletal: Positive for back pain. Negative for arthralgias.  Skin: Negative for color change and rash.  Neurological: Negative for seizures and syncope.  All other systems reviewed and are negative.    Physical Exam Updated Vital Signs BP 105/68   Pulse 97   Temp 98.9 F (37.2 C) (Oral)   Resp 16   LMP 05/09/2013   SpO2 98%   Physical Exam Vitals signs and nursing note reviewed.  Constitutional:      General: She is not in acute distress.    Appearance: Normal appearance. She is normal weight. She is not ill-appearing, toxic-appearing or diaphoretic.  HENT:     Head: Normocephalic and atraumatic.     Nose: Nose normal. No congestion or rhinorrhea.     Mouth/Throat:     Mouth: Mucous membranes are moist.     Pharynx: Oropharynx is clear. No oropharyngeal exudate or posterior oropharyngeal erythema.  Eyes:     Extraocular Movements: Extraocular movements intact.     Conjunctiva/sclera: Conjunctivae normal.     Pupils: Pupils are equal, round, and reactive to light.  Neck:     Musculoskeletal: Normal range of motion and neck supple. No neck rigidity or muscular tenderness.  Cardiovascular:     Rate and Rhythm: Normal rate and regular  rhythm.     Pulses: Normal pulses.     Heart sounds: Normal heart sounds. No murmur. No friction rub. No gallop.   Pulmonary:     Effort: Pulmonary effort is normal. No respiratory distress.     Breath sounds: Normal breath sounds. No stridor. No wheezing, rhonchi or rales.  Abdominal:     General: Abdomen is flat. There is no distension.     Palpations: Abdomen is soft.     Tenderness: There is no abdominal tenderness. There is no guarding or rebound.  Musculoskeletal: Normal range of motion.        General: No swelling, deformity or signs of injury.     Comments: Patient localizes her pain to the mid lumbar spine but there is no significant tenderness to palpation of this area.  Her surgical scar is well-healed.  Patient has significant pain with straight leg raise but has full strength in her lower extremities.  Skin:    General: Skin is warm and dry.  Neurological:     General: No focal deficit present.     Mental Status: She is alert and oriented to person, place, and time. Mental status is at baseline.     Cranial Nerves: No cranial nerve deficit.     Sensory: No sensory deficit.     Motor: No weakness.  Psychiatric:        Mood and Affect: Mood normal.        Behavior: Behavior normal.      ED Treatments / Results  Labs (all labs ordered are listed, but only abnormal results are displayed) Labs Reviewed - No data to display  EKG None  Radiology No results found.  Procedures Procedures (including critical care time)  Medications Ordered in ED Medications  lidocaine (LIDODERM) 5 % 1 patch (1 patch Transdermal Patch Applied 02/27/19 1924)  ketorolac (TORADOL) 30 MG/ML injection 30 mg (30 mg Intramuscular Given 02/27/19 1923)  oxyCODONE-acetaminophen (PERCOCET/ROXICET) 5-325 MG per tablet 1 tablet (1 tablet Oral Given 02/27/19  1923)  cyclobenzaprine (FLEXERIL) tablet 5 mg (5 mg Oral Given 02/27/19 1923)  diazepam (VALIUM) tablet 5 mg (5 mg Oral Given 02/27/19 2151)      Initial Impression / Assessment and Plan / ED Course  I have reviewed the triage vital signs and the nursing notes.  Pertinent labs & imaging results that were available during my care of the patient were reviewed by me and considered in my medical decision making (see chart for details).        [name] [name] is a 38 y.o. female with recent L5-S1 microdiscectomy on 12/31/2018 presents to the ED complaining of lower back pain that started this morning when she woke up.  She has no saddle anesthesia, focal weakness, bowel or bladder incontinence, or fever.  Patient was given 30 mg of IM Toradol, Percocet, Flexeril, and lidocaine patch with improvement of her pain.  She continued to have a sensation of tightness in her lower back with movement for which she was given Valium with some improvement.  Patient was given a prescription for naproxen and advised to use this and Flexeril at home for her symptoms.  She was advised to follow-up closely with her primary care physician and neurosurgeon.  She was discharged home in stable condition.  Final Clinical Impressions(s) / ED Diagnoses   Final diagnoses:  Acute midline low back pain with bilateral sciatica    ED Discharge Orders         Ordered    naproxen (NAPROSYN) 500 MG tablet  2 times daily     02/27/19 2219           Candie Chroman, MD 02/27/19 2324    Little, Wenda Overland, MD 02/27/19 [phone]

## 2019-02-27 NOTE — ED Triage Notes (Signed)
Pt here for evaluation for back pain that radiates to her pelvic area.  Recent back surgery on 12/31/2018.  Pt states she took pain meds today with no relief. Pt tearful and obviously uncomfortable in triage.

## 2019-04-12 ENCOUNTER — Encounter: Payer: Self-pay | Admitting: Physical Therapy

## 2019-04-12 ENCOUNTER — Other Ambulatory Visit: Payer: Self-pay

## 2019-04-12 ENCOUNTER — Ambulatory Visit: Payer: Medicaid Other | Attending: Neurosurgery | Admitting: Physical Therapy

## 2019-04-12 DIAGNOSIS — G8929 Other chronic pain: Secondary | ICD-10-CM | POA: Diagnosis present

## 2019-04-12 DIAGNOSIS — M545 Low back pain: Secondary | ICD-10-CM | POA: Insufficient documentation

## 2019-04-12 NOTE — Patient Instructions (Signed)
Access Code: [email or web address]  URL: [email or web address]  Date: 04/12/2019  Prepared by: Elsie Ra   Exercises  Hooklying Single Knee to Chest - 3 reps - 1 sets - 30 hold - 2x daily - 6x weekly  Supine Bridge - 10 reps - 1-2 sets - 5 hold - 2x daily - 6x weekly  Slump Stretch - 10 reps - 1-2 sets - 3 hold - 2x daily - 6x weekly  Pelvic Tilt on Swiss Ball - 10 reps - 2-3 sets - 2x daily - 6x weekly  Swiss Ball March - 10 reps - 1-2 sets - 2x daily - 6x weekly  Standing Lumbar Extension - 10 reps - 1-2 sets - 5 hold - 2x daily - 6x weekly

## 2019-04-12 NOTE — Therapy (Signed)
[place], [place], [address] Phone: [phone]   Fax:  [phone]  Physical Therapy Evaluation  Patient Details  Name: [name] [name] MRN: [id number] Date of Birth: 1980-08-04 Referring Provider (PT): Consuella Lose, MD   Encounter Date: 04/12/2019  PT End of Session - 04/12/19 1144    Visit Number  1    Number of Visits  8    Date for PT Re-Evaluation  05/24/19    Authorization Type  MCD so likely need to resubmit after 3 visits    PT Start Time  1000    PT Stop Time  1045    PT Time Calculation (min)  45 min    Activity Tolerance  Patient tolerated treatment well    Behavior During Therapy  [facility] for tasks assessed/performed       Past Medical History:  Diagnosis Date  . Allergy   . Arthritis    right knee  . Asthma   . Constipation   . Depression   . GERD (gastroesophageal reflux disease)   . History of kidney stones   . Panic disorder   . Renal disorder   . Seasonal allergies   . SVD (spontaneous vaginal delivery)    x 3    Past Surgical History:  Procedure Laterality Date  . ABDOMINAL HYSTERECTOMY    . adiana    . BILATERAL SALPINGECTOMY Bilateral 05/31/2013   Procedure: BILATERAL SALPINGECTOMY;  Surgeon: Cheri Fowler, MD;  Location: [place] [facility];  Service: Gynecology;  Laterality: Bilateral;  . BREAST CYST ASPIRATION Right 2015  . COLONOSCOPY    . DILATION AND CURETTAGE OF UTERUS    . DNC    . KIDNEY STONE SURGERY    . KNEE ARTHROSCOPY Right   . KNEE ARTHROSCOPY WITH DRILLING/MICROFRACTURE Right 07/26/2014   Procedure: RIGHT KNEE ARTHROSCOPY WITH CHONDROPLASTY, MICROFRACTURE;  Surgeon: Marianna Payment, MD;  Location: [place];  Service: Orthopedics;  Laterality: Right;  . KNEE ARTHROSCOPY WITH MEDIAL MENISECTOMY Right 07/26/2014   Procedure: KNEE ARTHROSCOPY WITH MEDIAL MENISECTOMY;  Surgeon: Marianna Payment, MD;  Location: [place];   Service: Orthopedics;  Laterality: Right;  . LAPAROSCOPIC ASSISTED VAGINAL HYSTERECTOMY N/A 05/31/2013   Procedure: LAPAROSCOPIC ASSISTED VAGINAL HYSTERECTOMY;  Surgeon: Cheri Fowler, MD;  Location: [place] [facility];  Service: Gynecology;  Laterality: N/A;  . LAPAROSCOPY N/A 08/26/2017   Procedure: LAPAROSCOPY OPERATIVE WITH PERIOTONEAL BIOPSY AND FULGERATION OF ENDOMETRIOSIS;  Surgeon: Cheri Fowler, MD;  Location: [place] [facility];  Service: Gynecology;  Laterality: N/A;  . LUMBAR LAMINECTOMY/DECOMPRESSION MICRODISCECTOMY Right 12/31/2018   Procedure: MICRODISCECTOMY LUMBAR 5- SACRAL1;  Surgeon: Consuella Lose, MD;  Location: [place];  Service: Neurosurgery;  Laterality: Right;  MICRODISCECTOMY LUMBAR 5- SACRAL1  . PARTIAL HYSTERECTOMY    . TONSILLECTOMY    . TONSILLECTOMY AND ADENOIDECTOMY    . TUBAL LIGATION    . UPPER GI ENDOSCOPY     ulcer    There were no vitals filed for this visit.   Subjective Assessment - 04/12/19 1011    Subjective  history of microdiscectomy in June this year presents with acute on chronic lumbar back pain beginning in August 2020 when she woke up with severe pain. Causing her to go to ER. She was checked out and given meds which helped some. She followed up with neurosurgeon yesteday who referred her PT and may order MRI if needed. She is having pain and difficulty with sleeping, standing or walking too long,  sitting too long, walking up/down the stairs as she lives on 3rd floor apartment, laundry, long rides in the car.    Pertinent History  PMH: lumbar surgery 12/2018, knee OA    Limitations  Sitting;Lifting;Standing;Walking;House hold activities    How long can you sit comfortably?  30 min    How long can you stand comfortably?  45    How long can you walk comfortably?  has to lean on buggy to get through grocery store    Diagnostic tests  XR, may have another MRI    Patient Stated Goals  get rid of the pain so I can work    Currently in Pain?  Yes    Pain Score  6      Pain Location  Back    Pain Orientation  Lower    Pain Descriptors / Indicators  Aching;Dull;Stabbing;Tightness    Pain Type  Other (Comment)   acute on chronic, post surgical June 2020   Pain Radiating Towards  intermittent radiculopathy if sits too long but not much radiculopathy since surgery    Pain Onset  More than a month ago    Pain Frequency  Intermittent    Aggravating Factors   sleeping, standing or walking too long, sitting too long, walking up/down the stairs as she lives on 3rd floor apartment, laundry, long rides in the car.    Pain Relieving Factors  sometimes ice and heat, meds    Effect of Pain on Daily Activities  limits most ADL's    Multiple Pain Sites  No         [facility] PT Assessment - 04/12/19 0001      Assessment   Medical Diagnosis  LBP S/P L5-S1 microdiscectomy on 12/31/2018     Referring Provider (PT)  Consuella Lose, MD    Hand Dominance  Right    Next MD Visit  2 weeks    Prior Therapy  chrio for back before surgery but did not really help, has had PT for her knee in past due to OA      Precautions   Precautions  None      Restrictions   Weight Bearing Restrictions  No      Balance Screen   Has the patient fallen in the past 6 months  No      [place] residence    Additional Comments  lives on 3rd floor      Prior Function   Level of Independence  Independent    Vocation  Unemployed      Cognition   Overall Cognitive Status  Within Functional Limits for tasks assessed      Observation/Other Assessments   Focus on Therapeutic Outcomes (FOTO)   not done MCD      Sensation   Light Touch  Appears Intact      Coordination   Gross Motor Movements are Fluid and Coordinated  Yes      ROM / Strength   AROM / PROM / Strength  AROM;Strength      AROM   AROM Assessment Site  Lumbar    Lumbar Flexion  40%    Lumbar Extension  50%    Lumbar - Right Side Bend  WFL    Lumbar - Left Side Bend  WFL     Lumbar - Right Rotation   [facility]    Lumbar - Left Rotation  [facility]      Strength  Overall Strength Comments  hip strength overall 4/5 MMT, knee strength 5/5 MMT      Flexibility   Soft Tissue Assessment /Muscle Length  --   tight lumbar P.S, and hamstrings, and glutes     Special Tests   Other special tests  + slump and SLR on Lt that was neg on Rt. so may have disc pathology or neural tension, + quadrant test bilat for facet arthropathy.  Pain on Lt with Long axis distraction.       Transfers   Transfers  Independent with all Transfers      Ambulation/Gait   Gait Comments  WFL for 100 ft today                Objective measurements completed on examination: See above findings.      [place] Adult PT Treatment/Exercise - 04/12/19 0001      Modalities   Modalities  Moist Heat      Moist Heat Therapy   Number Minutes Moist Heat  10 Minutes    Moist Heat Location  Lumbar Spine   during education            PT Education - 04/12/19 1143    Education Details  HEP, POC, exam findings    Person(s) Educated  Patient    Methods  Explanation;Demonstration;Verbal cues;Handout    Comprehension  Verbalized understanding;Need further instruction       PT Short Term Goals - 04/12/19 1159      PT SHORT TERM GOAL #1   Title  Pt will be I and compliant with HEP. (Target for STG 3 weeks 05/03/19)    Baseline  no HEP until today    Status  New      PT SHORT TERM GOAL #2   Title  Pt will verbalize 2 ways of managing her pain other than taking meds, examples may include heat/ice, exercise, stretching, relaxation techniques.    Baseline  only uses pain meds and sometimes ice or heat    Status  New        PT Long Term Goals - 04/12/19 1213      PT LONG TERM GOAL #1   Title  Pt will improve lumbar ROM to [facility] to improve mobility. (Target for all LTG 6 weeks 05/24/19)    Baseline  missing 50% lumbar flexion and extension ROM    Status  New      PT LONG TERM GOAL #2    Title  Pt will improve strength to at least 5-/5 MMT to improve function    Baseline  4/5 hip strength grossly    Status  New      PT LONG TERM GOAL #3   Title  Pt will reduce pain by overall 50%    Baseline  6/10    Status  New      PT LONG TERM GOAL #4   Title  Pt will report improved activity tolerance with standing, walking, stairs.    Baseline  has 6/10 pain with stairs and limited to only 45 minutes of standing    Status  New             Plan - 04/12/19 1145    Clinical Impression Statement  Pt presents with acute on chronic LBP S/P L5-S1 microdiscectomy on 12/31/2018. Pain appears to have muscular component but she may also have facet arthropathy or disc pathology based on special tests today. She has overall decreased lumbar ROM, decreased  leg strength, decreased activity tolerance paticularly for standing or walking, and increased pain limiting her function. She will benefit from skilled PT to address her deficits. If no improvement after 4 visits will refer back to MD as she may need further diagnostic testing.    Personal Factors and Comorbidities  Comorbidity 1;Comorbidity 2;Past/Current Experience    Comorbidities  PMH: lumbar surgery 12/2018, knee OA    Examination-Activity Limitations  Stand;Lift;Bend;Locomotion Level;Sleep;Squat;Stairs    Examination-Participation Restrictions  Meal Prep;Cleaning;Community Activity;Driving;Laundry;Shop    Stability/Clinical Decision Making  Evolving/Moderate complexity    Clinical Decision Making  Moderate    Rehab Potential  Fair    PT Frequency  1x / week    PT Duration  4 weeks    PT Treatment/Interventions  ADLs/Self Care Home Management;Aquatic Therapy;Cryotherapy;Electrical Stimulation;Iontophoresis 4mg /ml Dexamethasone;Moist Heat;Traction;Ultrasound;Therapeutic activities;Therapeutic exercise;Balance training;Neuromuscular re-education;Manual techniques;Passive range of motion;Dry needling;Joint Manipulations;Spinal  Manipulations;Taping    PT Next Visit Plan  review HEP, consider manual therapy and spinal mobs/manip    Consulted and Agree with Plan of Care  Patient       Patient will benefit from skilled therapeutic intervention in order to improve the following deficits and impairments:  Decreased activity tolerance, Decreased range of motion, Decreased strength, Difficulty walking, Postural dysfunction, Pain, Increased muscle spasms  Visit Diagnosis: Chronic bilateral low back pain, unspecified whether sciatica present     Problem List Patient Active Problem List   Diagnosis Date Noted  . Pelvic pain in female 08/26/2017  . Pelvic peritoneal adhesions, female 08/26/2017  . Endometriosis of pelvis 08/26/2017  . Unilateral primary osteoarthritis, right knee 06/09/2017  . Unilateral primary osteoarthritis, left knee 06/09/2017  . Family history of breast cancer in mother 12/06/2015  . Menorrhagia 05/31/2013  . Dysmenorrhea 05/31/2013  . Dyspareunia 05/31/2013  . Knee pain 08/24/2012  . Asthma 08/05/2011    [name] [name] 04/12/2019, 12:16 PM  [facility] [address] [place], [place], [address] Phone: [phone]   Fax:  [phone]  Name: [name] [name] MRN: [id number] Date of Birth: 03/02/1981

## 2019-04-20 ENCOUNTER — Ambulatory Visit: Payer: Medicaid Other | Admitting: Physical Therapy

## 2019-04-20 ENCOUNTER — Other Ambulatory Visit: Payer: Self-pay

## 2019-04-20 DIAGNOSIS — G8929 Other chronic pain: Secondary | ICD-10-CM

## 2019-04-20 DIAGNOSIS — M545 Low back pain: Secondary | ICD-10-CM | POA: Diagnosis not present

## 2019-04-21 NOTE — Therapy (Addendum)
[place], [place], [address] Phone: [phone]   Fax:  [phone]  Physical Therapy Treatment/Discharge addendum PHYSICAL THERAPY DISCHARGE SUMMARY  Visits from Start of Care: 2  Current functional level related to goals / functional outcomes: See below   Remaining deficits: See below   Education / Equipment: HEP Plan: Patient agrees to discharge.  Patient goals were not met. Patient is being discharged due to not returning since the last visit.  ?????  Only came for 2 visits and did not return, reason unknown.  Elsie Ra, PT, DPT 06/29/19 11:30 AM     Patient Details  Name: [name] [name] MRN: [id number] Date of Birth: May 15, 1981 Referring Provider (PT): Consuella Lose, MD   Encounter Date: 04/20/2019  PT End of Session - 04/21/19 1108    Visit Number  2    Number of Visits  8    Date for PT Re-Evaluation  05/24/19    Authorization Type  MCD so likely need to resubmit after 3 visits    PT Start Time  1535    PT Stop Time  1619    PT Time Calculation (min)  44 min    Activity Tolerance  Patient tolerated treatment well    Behavior During Therapy  [facility] for tasks assessed/performed       Past Medical History:  Diagnosis Date  . Allergy   . Arthritis    right knee  . Asthma   . Constipation   . Depression   . GERD (gastroesophageal reflux disease)   . History of kidney stones   . Panic disorder   . Renal disorder   . Seasonal allergies   . SVD (spontaneous vaginal delivery)    x 3    Past Surgical History:  Procedure Laterality Date  . ABDOMINAL HYSTERECTOMY    . adiana    . BILATERAL SALPINGECTOMY Bilateral 05/31/2013   Procedure: BILATERAL SALPINGECTOMY;  Surgeon: Cheri Fowler, MD;  Location: [place] [facility];  Service: Gynecology;  Laterality: Bilateral;  . BREAST CYST ASPIRATION Right 2015  . COLONOSCOPY    . DILATION AND CURETTAGE OF UTERUS    . DNC    .  KIDNEY STONE SURGERY    . KNEE ARTHROSCOPY Right   . KNEE ARTHROSCOPY WITH DRILLING/MICROFRACTURE Right 07/26/2014   Procedure: RIGHT KNEE ARTHROSCOPY WITH CHONDROPLASTY, MICROFRACTURE;  Surgeon: Marianna Payment, MD;  Location: [place];  Service: Orthopedics;  Laterality: Right;  . KNEE ARTHROSCOPY WITH MEDIAL MENISECTOMY Right 07/26/2014   Procedure: KNEE ARTHROSCOPY WITH MEDIAL MENISECTOMY;  Surgeon: Marianna Payment, MD;  Location: [place];  Service: Orthopedics;  Laterality: Right;  . LAPAROSCOPIC ASSISTED VAGINAL HYSTERECTOMY N/A 05/31/2013   Procedure: LAPAROSCOPIC ASSISTED VAGINAL HYSTERECTOMY;  Surgeon: Cheri Fowler, MD;  Location: [place] [facility];  Service: Gynecology;  Laterality: N/A;  . LAPAROSCOPY N/A 08/26/2017   Procedure: LAPAROSCOPY OPERATIVE WITH PERIOTONEAL BIOPSY AND FULGERATION OF ENDOMETRIOSIS;  Surgeon: Cheri Fowler, MD;  Location: [place] [facility];  Service: Gynecology;  Laterality: N/A;  . LUMBAR LAMINECTOMY/DECOMPRESSION MICRODISCECTOMY Right 12/31/2018   Procedure: MICRODISCECTOMY LUMBAR 5- SACRAL1;  Surgeon: Consuella Lose, MD;  Location: [place];  Service: Neurosurgery;  Laterality: Right;  MICRODISCECTOMY LUMBAR 5- SACRAL1  . PARTIAL HYSTERECTOMY    . TONSILLECTOMY    . TONSILLECTOMY AND ADENOIDECTOMY    . TUBAL LIGATION    . UPPER GI ENDOSCOPY     ulcer    There were no vitals filed for this  visit.  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  30 min    How long can you stand comfortably?  45    How long can you walk comfortably?  has to lean on buggy to get through grocery store    Diagnostic tests  XR, may have  another MRI    Patient Stated Goals  get rid of the pain so I can work    Pain Onset  More than a month ago                       [facility] Adult PT Treatment/Exercise - 04/21/19 0001      Exercises   Exercises  Lumbar      Lumbar Exercises: Stretches   Active Hamstring Stretch  Right;Left;2 reps;30 seconds    Single Knee to Chest Stretch  Right;Left;2 reps;30 seconds    Lower Trunk Rotation Limitations  10 reps X 5 sec    Pelvic Tilt  20 reps    Piriformis Stretch  Right;Left;2 reps;30 seconds    Other Lumbar Stretch Exercise  prone over one pillow 2 min, progressed to no pillow 2 min, progressed to prone on elbows (caused leg pain) then finished laying over 2 pillows 2 min      Lumbar Exercises: Aerobic   Nustep  6 min L3 UE/LE with heat      Lumbar Exercises: Supine   Clam  20 reps    Clam Limitations  red    Dead Bug  10 reps    Straight Leg Raise  10 reps      Lumbar Exercises: Sidelying   Clam  Both;10 reps      Manual Therapy   Manual therapy comments  LAD, passive ROM for hips and manual stretching               PT Short Term Goals - 04/12/19 1159      PT SHORT TERM GOAL #1   Title  Pt will be I and compliant with HEP. (Target for STG 3 weeks 05/03/19)    Baseline  no HEP until today    Status  New      PT SHORT TERM GOAL #2   Title  Pt will verbalize 2 ways of managing her pain other than taking meds, examples may include heat/ice, exercise, stretching, relaxation techniques.    Baseline  only uses pain meds and sometimes ice or heat    Status  New        PT Long Term Goals - 04/12/19 1213      PT LONG TERM GOAL #1   Title  Pt will improve lumbar ROM to [facility] to improve mobility. (Target for all LTG 6 weeks 05/24/19)    Baseline  missing 50% lumbar flexion and extension ROM    Status  New      PT LONG TERM GOAL #2   Title  Pt will improve strength to at least 5-/5 MMT to improve function    Baseline  4/5 hip strength grossly     Status  New      PT LONG TERM GOAL #3  Title  Pt will reduce pain by overall 50%    Baseline  6/10    Status  New      PT LONG TERM GOAL #4   Title  Pt will report improved activity tolerance with standing, walking, stairs.    Baseline  has 6/10 pain with stairs and limited to only 45 minutes of standing    Status  New            Plan - 04/21/19 1109    Clinical Impression Statement  Did not improve with prone or extension progression so instead shifted focuse to core strenght, hip strenght, and lumbar stretching along with manual therapy to decreased pain. Did report pain a little better after session.    Personal Factors and Comorbidities  Comorbidity 1;Comorbidity 2;Past/Current Experience    Comorbidities  PMH: lumbar surgery 12/2018, knee OA    Examination-Activity Limitations  Stand;Lift;Bend;Locomotion Level;Sleep;Squat;Stairs    Examination-Participation Restrictions  Meal Prep;Cleaning;Community Activity;Driving;Laundry;Shop    Stability/Clinical Decision Making  Evolving/Moderate complexity    Rehab Potential  Fair    PT Frequency  1x / week    PT Duration  4 weeks    PT Treatment/Interventions  ADLs/Self Care Home Management;Aquatic Therapy;Cryotherapy;Electrical Stimulation;Iontophoresis 75m/ml Dexamethasone;Moist Heat;Traction;Ultrasound;Therapeutic activities;Therapeutic exercise;Balance training;Neuromuscular re-education;Manual techniques;Passive range of motion;Dry needling;Joint Manipulations;Spinal Manipulations;Taping    PT Next Visit Plan  review HEP, consider manual therapy and spinal mobs/manip    Consulted and Agree with Plan of Care  Patient       Patient will benefit from skilled therapeutic intervention in order to improve the following deficits and impairments:  Decreased activity tolerance, Decreased range of motion, Decreased strength, Difficulty walking, Postural dysfunction, Pain, Increased muscle spasms  Visit Diagnosis: Chronic bilateral low  back pain, unspecified whether sciatica present     Problem List Patient Active Problem List   Diagnosis Date Noted  . Pelvic pain in female 08/26/2017  . Pelvic peritoneal adhesions, female 08/26/2017  . Endometriosis of pelvis 08/26/2017  . Unilateral primary osteoarthritis, right knee 06/09/2017  . Unilateral primary osteoarthritis, left knee 06/09/2017  . Family history of breast cancer in mother 12/06/2015  . Menorrhagia 05/31/2013  . Dysmenorrhea 05/31/2013  . Dyspareunia 05/31/2013  . Knee pain 08/24/2012  . Asthma 08/05/2011    BSilvestre Mesi10/15/2020, 11:32 AM  [facility] [address]Phone: [phone]  Fax:  [phone] Name: [name] EwtonMRN: [id number] of Birth: 626-Oct-1982

## 2019-04-25 ENCOUNTER — Ambulatory Visit: Payer: Medicaid Other | Admitting: Physical Therapy

## 2019-05-04 ENCOUNTER — Ambulatory Visit: Payer: Medicaid Other | Admitting: Physical Therapy

## 2019-06-29 ENCOUNTER — Ambulatory Visit: Payer: Medicaid Other | Admitting: Family Medicine

## 2019-07-05 ENCOUNTER — Encounter: Payer: Self-pay | Admitting: Physician Assistant

## 2019-07-05 ENCOUNTER — Telehealth: Payer: Self-pay

## 2019-07-05 ENCOUNTER — Other Ambulatory Visit: Payer: Self-pay

## 2019-07-05 ENCOUNTER — Ambulatory Visit (INDEPENDENT_AMBULATORY_CARE_PROVIDER_SITE_OTHER): Payer: Medicaid Other | Admitting: Orthopaedic Surgery

## 2019-07-05 DIAGNOSIS — M25561 Pain in right knee: Secondary | ICD-10-CM | POA: Diagnosis not present

## 2019-07-05 DIAGNOSIS — M25562 Pain in left knee: Secondary | ICD-10-CM

## 2019-07-05 DIAGNOSIS — G8929 Other chronic pain: Secondary | ICD-10-CM

## 2019-07-05 MED ORDER — LIDOCAINE HCL 1 % IJ SOLN
3.0000 mL | INTRAMUSCULAR | Status: AC | PRN
  Administered 2019-07-05: 3 mL

## 2019-07-05 MED ORDER — METHYLPREDNISOLONE ACETATE 40 MG/ML IJ SUSP
13.3300 mg | INTRAMUSCULAR | Status: AC | PRN
  Administered 2019-07-05: 13.33 mg via INTRA_ARTICULAR

## 2019-07-05 MED ORDER — BUPIVACAINE HCL 0.25 % IJ SOLN
0.6600 mL | INTRAMUSCULAR | Status: AC | PRN
  Administered 2019-07-05: .66 mL via INTRA_ARTICULAR

## 2019-07-05 MED ORDER — TRAMADOL HCL 50 MG PO TABS: 50.0000 mg | ORAL_TABLET | Freq: Two times a day (BID) | ORAL | 1 refills | Status: DC | PRN

## 2019-07-05 MED ORDER — LIDOCAINE HCL 1 % IJ SOLN
3.0000 mL | INTRAMUSCULAR | Status: AC | PRN
  Administered 2019-07-05: 16:00:00 3 mL

## 2019-07-05 NOTE — Telephone Encounter (Signed)
Please submit for gel injection bil knees.

## 2019-07-05 NOTE — Progress Notes (Signed)
Office Visit Note   Patient: [name] [name]           Date of Birth: 03/11/1981           MRN: [id number] Visit Date: 07/05/2019              Requested by: [facility] [place] [address] [place],  [place] [address] PCP: [place]: Visit Diagnoses:  1. Chronic pain of both knees     Plan: Impression is bilateral knee arthritis.  We have discussed aspiration and injection to the right knee and injection to the left knee, but she would rather just have both knees injected with cortisone.  We will try and obtain paperwork for her to fill out for possible viscosupplementation injections.  She will follow up with [place] as needed.  Follow-Up Instructions: Return if symptoms worsen or fail to improve.   Orders:  Orders Placed This Encounter  Procedures  . Large Joint Inj: bilateral knee   Meds ordered this encounter  Medications  . traMADol (ULTRAM) 50 MG tablet    Sig: Take 1 tablet (50 mg total) by mouth 2 (two) times daily as needed.    Dispense:  30 tablet    Refill:  1      Procedures: Large Joint Inj: bilateral knee on 07/05/2019 3:46 PM Indications: pain Details: 22 G needle, anterolateral approach Medications (Right): 0.66 mL bupivacaine 0.25 %; 3 mL lidocaine 1 %; 13.33 mg methylPREDNISolone acetate 40 MG/ML Medications (Left): 0.66 mL bupivacaine 0.25 %; 3 mL lidocaine 1 %; 13.33 mg methylPREDNISolone acetate 40 MG/ML      Clinical Data: No additional findings.   Subjective: Chief Complaint  Patient presents with  . Left Knee - Pain    HPI patient is a pleasant 38 year old female who comes in today with recurrent bilateral knee pain right greater than left.  She has been dealing with moderate degenerative joint disease to both knees for several years.  She was seen in our office in April 2019 where both knees were injected with cortisone.  She notes that she had her right knee aspirated and injected in January  2020 although the note says it was her left knee.  In regards to her symptoms, they are primarily to the anteromedial aspect.  Worse going up and down stairs.  She has occasional night pain.  She has been using muscle rubs as well as sleeping with a pillow between her legs with slight relief of symptoms.  She has been unable to get viscosupplementation injections due to her insurance.  Review of Systems as detailed in HPI.  All others reviewed and are negative.   Objective: Vital Signs: LMP 05/09/2013   Physical Exam well-developed and well-nourished female in no acute distress.  Alert oriented x3.  Ortho Exam examination of her right knee reveals a 1+ effusion.  No effusion on the left.  Range of motion 0 to 120 degrees.  Medial joint line tenderness right greater than left.  Mild patellofemoral crepitus.  She is neurovascular intact distally.  Specialty Comments:  No specialty comments available.  Imaging: No new imaging   PMFS History: Patient Active Problem List   Diagnosis Date Noted  . Pelvic pain in female 08/26/2017  . Pelvic peritoneal adhesions, female 08/26/2017  . Endometriosis of pelvis 08/26/2017  . Unilateral primary osteoarthritis, right knee 06/09/2017  . Unilateral primary osteoarthritis, left knee 06/09/2017  . Family history of breast cancer in mother  12/06/2015  . Menorrhagia 05/31/2013  . Dysmenorrhea 05/31/2013  . Dyspareunia 05/31/2013  . Knee pain 08/24/2012  . Asthma 08/05/2011   Past Medical History:  Diagnosis Date  . Allergy   . Arthritis    right knee  . Asthma   . Constipation   . Depression   . GERD (gastroesophageal reflux disease)   . History of kidney stones   . Panic disorder   . Renal disorder   . Seasonal allergies   . SVD (spontaneous vaginal delivery)    x 3    Family History  Problem Relation Age of Onset  . Hypertension Mother   . Diabetes Mother   . Breast cancer Mother 36       IDC; s/p lumpectomy, radiation, and  arimidex  . Prostate cancer Paternal Grandfather        dx. 50s-60s  . Other Sister        both full sisters also have dense breast tissue  . Breast cancer Maternal Grandmother 83       IDC  . Colon polyps Maternal Grandmother        approx 4-5 colon polyps  . Pancreatic cancer Other        maternal great aunt (MGF's sister) d. pancreatic cancer  . Cancer Other        maternal great aunt (MGF's sister) dx NOS cancer  . Cancer Other        maternal great grandmother (MGF's mother) d. NOS cancer in her 89s  . Cancer Cousin 25       paternal 1st cousin dx. w/ either throat or thyroid cancer; not a smoker  . Breast cancer Other        paternal great aunt (PGF's sister) dx. breast cancer, 74s  . Colon cancer Neg Hx   . Rectal cancer Neg Hx   . Stomach cancer Neg Hx   . Esophageal cancer Neg Hx     Past Surgical History:  Procedure Laterality Date  . ABDOMINAL HYSTERECTOMY    . adiana    . BILATERAL SALPINGECTOMY Bilateral 05/31/2013   Procedure: BILATERAL SALPINGECTOMY;  Surgeon: Cheri Fowler, MD;  Location: [facility];  Service: Gynecology;  Laterality: Bilateral;  . BREAST CYST ASPIRATION Right 2015  . COLONOSCOPY    . DILATION AND CURETTAGE OF UTERUS    . DNC    . KIDNEY STONE SURGERY    . KNEE ARTHROSCOPY Right   . KNEE ARTHROSCOPY WITH DRILLING/MICROFRACTURE Right 07/26/2014   Procedure: RIGHT KNEE ARTHROSCOPY WITH CHONDROPLASTY, MICROFRACTURE;  Surgeon: Marianna Payment, MD;  Location: [facility];  Service: Orthopedics;  Laterality: Right;  . KNEE ARTHROSCOPY WITH MEDIAL MENISECTOMY Right 07/26/2014   Procedure: KNEE ARTHROSCOPY WITH MEDIAL MENISECTOMY;  Surgeon: Marianna Payment, MD;  Location: [facility];  Service: Orthopedics;  Laterality: Right;  . LAPAROSCOPIC ASSISTED VAGINAL HYSTERECTOMY N/A 05/31/2013   Procedure: LAPAROSCOPIC ASSISTED VAGINAL HYSTERECTOMY;  Surgeon: Cheri Fowler, MD;  Location: [facility];  Service: Gynecology;   Laterality: N/A;  . LAPAROSCOPY N/A 08/26/2017   Procedure: LAPAROSCOPY OPERATIVE WITH PERIOTONEAL BIOPSY AND FULGERATION OF ENDOMETRIOSIS;  Surgeon: Cheri Fowler, MD;  Location: [facility];  Service: Gynecology;  Laterality: N/A;  . LUMBAR LAMINECTOMY/DECOMPRESSION MICRODISCECTOMY Right 12/31/2018   Procedure: MICRODISCECTOMY LUMBAR 5- SACRAL1;  Surgeon: Consuella Lose, MD;  Location: [facility];  Service: Neurosurgery;  Laterality: Right;  MICRODISCECTOMY LUMBAR 5- SACRAL1  . PARTIAL HYSTERECTOMY    . TONSILLECTOMY    . TONSILLECTOMY  AND ADENOIDECTOMY    . TUBAL LIGATION    . UPPER GI ENDOSCOPY     ulcer   Social History   Occupational History  . Not on file  Tobacco Use  . Smoking status: Never Smoker  . Smokeless tobacco: Never Used  Substance and Sexual Activity  . Alcohol use: No    Alcohol/week: 0.0 standard drinks  . Drug use: No  . Sexual activity: Not on file

## 2019-07-07 NOTE — Telephone Encounter (Signed)
Noted.  Will submit for [employer] Patient Assistance due to patient having Medicaid.

## 2019-07-11 ENCOUNTER — Telehealth: Payer: Self-pay

## 2019-07-11 NOTE — Telephone Encounter (Signed)
Mailed [employer] application to address listed in chart for patient to complete and return for Monovisc, bilateral knee.

## 2019-07-27 ENCOUNTER — Ambulatory Visit (INDEPENDENT_AMBULATORY_CARE_PROVIDER_SITE_OTHER): Payer: Medicaid Other

## 2019-07-27 ENCOUNTER — Encounter: Payer: Self-pay | Admitting: Orthopaedic Surgery

## 2019-07-27 ENCOUNTER — Ambulatory Visit (INDEPENDENT_AMBULATORY_CARE_PROVIDER_SITE_OTHER): Payer: Medicaid Other | Admitting: Orthopaedic Surgery

## 2019-07-27 ENCOUNTER — Other Ambulatory Visit: Payer: Self-pay

## 2019-07-27 DIAGNOSIS — M1711 Unilateral primary osteoarthritis, right knee: Secondary | ICD-10-CM | POA: Diagnosis not present

## 2019-07-27 MED ORDER — TRAMADOL HCL 50 MG PO TABS: 50.0000 mg | ORAL_TABLET | Freq: Every day | ORAL | 2 refills | Status: DC | PRN

## 2019-07-27 NOTE — Progress Notes (Signed)
Office Visit Note   Patient: [name] [name]           Date of Birth: 1981/05/17           MRN: [id number] Visit Date: 07/27/2019              Requested by: [facility] [place] [address] [place],  [place] [address] PCP: [facility]: Visit Diagnoses:  1. Primary osteoarthritis of right knee     Plan: Impression is end-stage right knee DJD.  Unfortunately she has aged advanced DJD of the right knee.  She has not received much relief from nonsurgical treatments.  After a long discussion she has decided that she would like to proceed with a right total knee replacement.  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  Tramadol refill today.  We will schedule surgery in the near future.  Risk benefits alternatives and rehab recovery were reviewed today with the patient.  We will help get everything set up for her for same-day discharge.  Follow-Up Instructions: Return for 2 week postop visit.   Orders:  Orders Placed This Encounter  Procedures  . XR KNEE 3 VIEW RIGHT   Meds ordered this encounter  Medications  . traMADol (ULTRAM) 50 MG tablet    Sig: Take 1-2 tablets (50-100 mg total) by mouth daily as needed.    Dispense:  30 tablet    Refill:  2      Procedures: No procedures performed   Clinical Data: No additional findings.   Subjective: Chief Complaint  Patient presents with  . Right Knee - Pain    [name] [name] is a 39 year old female who is well-known to me comes in for evaluation of chronic right knee pain.  She has had had a couple of arthroscopic knee surgeries in the past unfortunately she has progressed to degenerative joint disease.  Cortisone injections have not helped to a significant degree.  She has now significantly limited in terms of her  activity and ADLs.  She is currently unemployed.  She feels pain mainly on the medial side and the anterior side of her knee.   Review of Systems  Constitutional: Negative.   HENT: Negative.   Eyes: Negative.   Respiratory: Negative.   Cardiovascular: Negative.   Endocrine: Negative.   Musculoskeletal: Negative.   Neurological: Negative.   Hematological: Negative.   Psychiatric/Behavioral: Negative.   All other systems reviewed and are negative.    Objective: Vital Signs: LMP 05/09/2013   Physical Exam Vitals and nursing note reviewed.  Constitutional:      Appearance: She is well-developed.  Pulmonary:     Effort: Pulmonary effort is normal.  Skin:    General: Skin is warm.     Capillary Refill: Capillary refill takes less than 2 seconds.  Neurological:     Mental Status: She is alert and oriented to person, place, and time.  Psychiatric:        Behavior: Behavior normal.        Thought Content: Thought content normal.        Judgment: Judgment normal.     Ortho Exam Right knee exam demonstrates no joint effusion.  She has medial joint tenderness.  Collaterals and cruciates are stable.  Normal range of motion.  Specialty Comments:  No  specialty comments available.  Imaging: XR KNEE 3 VIEW RIGHT  Result Date: 07/27/2019 Significant medial joint space narrowing and spurring of the patella.  Moderately severe DJD.    PMFS History: Patient Active Problem List   Diagnosis Date Noted  . Pelvic pain in female 08/26/2017  . Pelvic peritoneal adhesions, female 08/26/2017  . Endometriosis of pelvis 08/26/2017  . Unilateral primary osteoarthritis, right knee 06/09/2017  . Unilateral primary osteoarthritis, left knee 06/09/2017  . Family history of breast cancer in mother 12/06/2015  . Menorrhagia 05/31/2013  . Dysmenorrhea 05/31/2013  . Dyspareunia 05/31/2013  . Knee pain 08/24/2012  . Asthma 08/05/2011   Past Medical History:  Diagnosis Date  . Allergy   .  Arthritis    right knee  . Asthma   . Constipation   . Depression   . GERD (gastroesophageal reflux disease)   . History of kidney stones   . Panic disorder   . Renal disorder   . Seasonal allergies   . SVD (spontaneous vaginal delivery)    x 3    Family History  Problem Relation Age of Onset  . Hypertension Mother   . Diabetes Mother   . Breast cancer Mother 36       IDC; s/p lumpectomy, radiation, and arimidex  . Prostate cancer Paternal Grandfather        dx. 50s-60s  . Other Sister        both full sisters also have dense breast tissue  . Breast cancer Maternal Grandmother 83       IDC  . Colon polyps Maternal Grandmother        approx 4-5 colon polyps  . Pancreatic cancer Other        maternal great aunt (MGF's sister) d. pancreatic cancer  . Cancer Other        maternal great aunt (MGF's sister) dx NOS cancer  . Cancer Other        maternal great grandmother (MGF's mother) d. NOS cancer in her 18s  . Cancer Cousin 25       paternal 1st cousin dx. w/ either throat or thyroid cancer; not a smoker  . Breast cancer Other        paternal great aunt (PGF's sister) dx. breast cancer, 23s  . Colon cancer Neg Hx   . Rectal cancer Neg Hx   . Stomach cancer Neg Hx   . Esophageal cancer Neg Hx     Past Surgical History:  Procedure Laterality Date  . ABDOMINAL HYSTERECTOMY    . adiana    . BILATERAL SALPINGECTOMY Bilateral 05/31/2013   Procedure: BILATERAL SALPINGECTOMY;  Surgeon: Cheri Fowler, MD;  Location: [facility];  Service: Gynecology;  Laterality: Bilateral;  . BREAST CYST ASPIRATION Right 2015  . COLONOSCOPY    . DILATION AND CURETTAGE OF UTERUS    . DNC    . KIDNEY STONE SURGERY    . KNEE ARTHROSCOPY Right   . KNEE ARTHROSCOPY WITH DRILLING/MICROFRACTURE Right 07/26/2014   Procedure: RIGHT KNEE ARTHROSCOPY WITH CHONDROPLASTY, MICROFRACTURE;  Surgeon: Marianna Payment, MD;  Location: [facility];  Service: Orthopedics;  Laterality: Right;  . KNEE  ARTHROSCOPY WITH MEDIAL MENISECTOMY Right 07/26/2014   Procedure: KNEE ARTHROSCOPY WITH MEDIAL MENISECTOMY;  Surgeon: Marianna Payment, MD;  Location: [facility];  Service: Orthopedics;  Laterality: Right;  . LAPAROSCOPIC ASSISTED VAGINAL HYSTERECTOMY N/A 05/31/2013   Procedure: LAPAROSCOPIC ASSISTED VAGINAL HYSTERECTOMY;  Surgeon: Cheri Fowler, MD;  Location: [facility];  Service: Gynecology;  Laterality: N/A;  . LAPAROSCOPY N/A 08/26/2017   Procedure: LAPAROSCOPY OPERATIVE WITH PERIOTONEAL BIOPSY AND FULGERATION OF ENDOMETRIOSIS;  Surgeon: Cheri Fowler, MD;  Location: [facility];  Service: Gynecology;  Laterality: N/A;  . LUMBAR LAMINECTOMY/DECOMPRESSION MICRODISCECTOMY Right 12/31/2018   Procedure: MICRODISCECTOMY LUMBAR 5- SACRAL1;  Surgeon: Consuella Lose, MD;  Location: [facility];  Service: Neurosurgery;  Laterality: Right;  MICRODISCECTOMY LUMBAR 5- SACRAL1  . PARTIAL HYSTERECTOMY    . TONSILLECTOMY    . TONSILLECTOMY AND ADENOIDECTOMY    . TUBAL LIGATION    . UPPER GI ENDOSCOPY     ulcer   Social History   Occupational History  . Not on file  Tobacco Use  . Smoking status: Never Smoker  . Smokeless tobacco: Never Used  Substance and Sexual Activity  . Alcohol use: No    Alcohol/week: 0.0 standard drinks  . Drug use: No  . Sexual activity: Not on file

## 2019-08-01 ENCOUNTER — Encounter (HOSPITAL_BASED_OUTPATIENT_CLINIC_OR_DEPARTMENT_OTHER): Payer: Self-pay | Admitting: Orthopaedic Surgery

## 2019-08-01 ENCOUNTER — Other Ambulatory Visit: Payer: Self-pay

## 2019-08-03 NOTE — Progress Notes (Signed)
[id number] - [place], [place] [address] AT [address] [place] [address] Phone: [phone] Fax: [phone]      Your procedure is scheduled on Monday, Aug 08, 2019.  Report to [facility] Main Entrance "A" at 05:30 A.M., and check in at the Admitting office.  Call this number if you have problems the morning of surgery:  [phone]   Call [phone] if you have any questions prior to your surgery date Monday-Friday 8am-4pm    Remember:  Do not eat after midnight the night before your surgery  You may drink clear liquids until 04:15am the morning of your surgery.   Clear liquids allowed are: Water, Non-Citrus Juices (without pulp), Carbonated Beverages, Clear Tea, Black Coffee Only, and Gatorade    Take these medicines the morning of surgery with A SIP OF WATER:  Albuterol (VENTOLIN HFA) - as needed   Buspirone (BUSPAR) - as needed  Fluticasone-Salmeterol (ADVAIR)   Montelukast (SINGULAIR) - as needed  Omeprazole (PRILOSEC)   Tramadol (ULTRAM) - as needed **Please bring all inhalers with you the day of surgery.    **Please complete your PRE-SURGERY ENSURE that was provided to you by 04:15am the morning of surgery.  Please, if able, drink it in one setting. DO NOT SIP.   7 days prior to surgery STOP taking any Aspirin (unless otherwise instructed by your surgeon), Aleve, Naproxen, Ibuprofen, Motrin, Advil, Goody's, BC's, all herbal medications, fish oil, and all vitamins.    The Morning of Surgery  Do not wear jewelry, make-up or nail polish.  Do not wear lotions, powders, perfumes, or deodorant  Do not shave 48 hours prior to surgery.    Do not bring valuables to the hospital.  [facility] is not responsible for any belongings or valuables.  If you are a smoker, DO NOT Smoke 24 hours prior to surgery  If you wear a CPAP at night please bring your mask the morning of surgery   Remember that you must  have someone to transport you home after your surgery, and remain with you for 24 hours if you are discharged the same day.   Please bring cases for contacts, glasses, hearing aids, dentures or bridgework because it cannot be worn into surgery.    Leave your suitcase in the car.  After surgery it may be brought to your room.  For patients admitted to the hospital, discharge time will be determined by your treatment team.  Patients discharged the day of surgery will not be allowed to drive home.    Special instructions:   [facility]- Preparing For Surgery  Before surgery, you can play an important role. Because skin is not sterile, your skin needs to be as free of germs as possible. You can reduce the number of germs on your skin by washing with CHG (chlorahexidine gluconate) Soap before surgery.  CHG is an antiseptic cleaner which kills germs and bonds with the skin to continue killing germs even after washing.    Oral Hygiene is also important to reduce your risk of infection.  Remember - BRUSH YOUR TEETH THE MORNING OF SURGERY WITH YOUR REGULAR TOOTHPASTE  Please do not use if you have an allergy to CHG or antibacterial soaps. If your skin becomes reddened/irritated stop using the CHG.  Do not shave (including legs and underarms) for at least 48 hours prior to first CHG shower. It is OK to shave your face.  Please follow these  instructions carefully.   1. Shower the NIGHT BEFORE SURGERY and the MORNING OF SURGERY with CHG Soap.   2. If you chose to wash your hair, wash your hair first as usual with your normal shampoo.  3. After you shampoo, rinse your hair and body thoroughly to remove the shampoo.  4. Use CHG as you would any other liquid soap. You can apply CHG directly to the skin and wash gently with a scrungie or a clean washcloth.   5. Apply the CHG Soap to your body ONLY FROM THE NECK DOWN.  Do not use on open wounds or open sores. Avoid contact with your eyes, ears, mouth  and genitals (private parts). Wash Face and genitals (private parts)  with your normal soap.   6. Wash thoroughly, paying special attention to the area where your surgery will be performed.  7. Thoroughly rinse your body with warm water from the neck down.  8. DO NOT shower/wash with your normal soap after using and rinsing off the CHG Soap.  9. Pat yourself dry with a CLEAN TOWEL.  10. Wear CLEAN PAJAMAS to bed the night before surgery, wear comfortable clothes the morning of surgery  11. Place CLEAN SHEETS on your bed the night of your first shower and DO NOT SLEEP WITH PETS.    Day of Surgery:  Please shower the morning of surgery with the CHG soap Do not apply any deodorants/lotions. Please wear clean clothes to the hospital/surgery center.   Remember to brush your teeth WITH YOUR REGULAR TOOTHPASTE.   Please read over the following fact sheets that you were given.

## 2019-08-04 ENCOUNTER — Other Ambulatory Visit (HOSPITAL_COMMUNITY)
Admission: RE | Admit: 2019-08-04 | Discharge: 2019-08-04 | Disposition: A | Discharge status: Home / Self Care | Payer: Medicaid Other | Source: Ambulatory Visit | Attending: Orthopaedic Surgery | Admitting: Orthopaedic Surgery

## 2019-08-04 ENCOUNTER — Encounter (HOSPITAL_COMMUNITY)
Admission: RE | Admit: 2019-08-04 | Discharge: 2019-08-04 | Disposition: A | Discharge status: Home / Self Care | Payer: Medicaid Other | Source: Ambulatory Visit | Attending: Orthopaedic Surgery | Admitting: Orthopaedic Surgery

## 2019-08-04 ENCOUNTER — Other Ambulatory Visit: Payer: Self-pay

## 2019-08-04 ENCOUNTER — Encounter (HOSPITAL_COMMUNITY): Payer: Self-pay

## 2019-08-04 ENCOUNTER — Encounter (HOSPITAL_COMMUNITY)
Admission: RE | Admit: 2019-08-04 | Discharge: 2019-08-04 | Disposition: A | Discharge status: Home / Self Care | Payer: Medicaid Other | Source: Ambulatory Visit | Attending: Physician Assistant | Admitting: Physician Assistant

## 2019-08-04 DIAGNOSIS — M1711 Unilateral primary osteoarthritis, right knee: Secondary | ICD-10-CM

## 2019-08-04 DIAGNOSIS — Z01818 Encounter for other preprocedural examination: Secondary | ICD-10-CM | POA: Insufficient documentation

## 2019-08-04 DIAGNOSIS — Z20822 Contact with and (suspected) exposure to covid-19: Secondary | ICD-10-CM | POA: Insufficient documentation

## 2019-08-04 HISTORY — DX: Pneumonia, unspecified organism: J18.9

## 2019-08-04 LAB — TYPE AND SCREEN
ABO/RH(D): A POS
Antibody Screen: NEGATIVE

## 2019-08-04 LAB — COMPREHENSIVE METABOLIC PANEL
ALT: 30 U/L (ref 0–44)
AST: 21 U/L (ref 15–41)
Albumin: 3.8 g/dL (ref 3.5–5.0)
Alkaline Phosphatase: 71 U/L (ref 38–126)
Anion gap: 10 (ref 5–15)
BUN: 9 mg/dL (ref 6–20)
CO2: 24 mmol/L (ref 22–32)
Calcium: 9 mg/dL (ref 8.9–10.3)
Chloride: 102 mmol/L (ref 98–111)
Creatinine, Ser: 0.89 mg/dL (ref 0.44–1.00)
GFR calc Af Amer: >60 mL/min (ref >60)
GFR calc non Af Amer: >60 mL/min (ref >60)
Glucose, Bld: 85 mg/dL (ref 70–99)
Potassium: 4 mmol/L (ref 3.5–5.1)
Sodium: 136 mmol/L (ref 135–145)
Total Bilirubin: 0.3 mg/dL (ref 0.3–1.2)
Total Protein: 6.5 g/dL (ref 6.5–8.1)

## 2019-08-04 LAB — SARS CORONAVIRUS 2 (TAT 6-24 HRS): SARS Coronavirus 2: NEGATIVE

## 2019-08-04 LAB — CBC WITH DIFFERENTIAL/PLATELET
Abs Immature Granulocytes: 0.02 10*3/uL (ref 0.00–0.07)
Basophils Absolute: 0.1 10*3/uL (ref 0.0–0.1)
Basophils Relative: 1 %
Eosinophils Absolute: 0.2 10*3/uL (ref 0.0–0.5)
Eosinophils Relative: 4 %
HCT: 41 % (ref 36.0–46.0)
Hemoglobin: 13.6 g/dL (ref 12.0–15.0)
Immature Granulocytes: 0 %
Lymphocytes Relative: 25 %
Lymphs Abs: 1.7 10*3/uL (ref 0.7–4.0)
MCH: 31.6 pg (ref 26.0–34.0)
MCHC: 33.2 g/dL (ref 30.0–36.0)
MCV: 95.3 fL (ref 80.0–100.0)
Monocytes Absolute: 0.6 10*3/uL (ref 0.1–1.0)
Monocytes Relative: 8 %
Neutro Abs: 4.1 10*3/uL (ref 1.7–7.7)
Neutrophils Relative %: 62 %
Platelets: 285 10*3/uL (ref 150–400)
RBC: 4.3 MIL/uL (ref 3.87–5.11)
RDW: 12.5 % (ref 11.5–15.5)
WBC: 6.7 10*3/uL (ref 4.0–10.5)
nRBC: 0 % (ref 0.0–0.2)

## 2019-08-04 LAB — SURGICAL PCR SCREEN
MRSA, PCR: NEGATIVE
Staphylococcus aureus: POSITIVE — AB

## 2019-08-04 LAB — URINALYSIS, ROUTINE W REFLEX MICROSCOPIC
Bilirubin Urine: NEGATIVE
Glucose, UA: NEGATIVE mg/dL
Hgb urine dipstick: NEGATIVE
Ketones, ur: NEGATIVE mg/dL
Leukocytes,Ua: NEGATIVE
Nitrite: NEGATIVE
Protein, ur: NEGATIVE mg/dL
Specific Gravity, Urine: 1.025 (ref 1.005–1.030)
pH: 6 (ref 5.0–8.0)

## 2019-08-04 LAB — PROTIME-INR
INR: 1 (ref 0.8–1.2)
Prothrombin Time: 12.8 seconds (ref 11.4–15.2)

## 2019-08-04 LAB — APTT: aPTT: 28 seconds (ref 24–36)

## 2019-08-04 LAB — ABO/RH: ABO/RH(D): A POS

## 2019-08-04 NOTE — Progress Notes (Signed)
PCP - [facility] Cardiologist - pt denies   Chest x-ray - 08/04/19 EKG - 08/04/19 Stress Test - pt denies ECHO - pt denies Cardiac Cath - pt denies   COVID TEST- 08/04/19   Anesthesia review: no  Patient denies shortness of breath, fever, cough and chest pain at PAT appointment   All instructions explained to the patient, with a verbal understanding of the material. Patient agrees to go over the instructions while at home for a better understanding. Patient also instructed to self quarantine after being tested for COVID-19. The opportunity to ask questions was provided.

## 2019-08-07 ENCOUNTER — Other Ambulatory Visit: Payer: Self-pay | Admitting: Orthopaedic Surgery

## 2019-08-07 MED ORDER — RIVAROXABAN 10 MG PO TABS: 10.0000 mg | ORAL_TABLET | Freq: Every day | ORAL | 0 refills | Status: DC

## 2019-08-07 MED ORDER — OXYCODONE HCL ER 10 MG PO T12A: 10.0000 mg | EXTENDED_RELEASE_TABLET | Freq: Two times a day (BID) | ORAL | 0 refills | Status: AC

## 2019-08-07 MED ORDER — GABAPENTIN 100 MG PO CAPS: 100.0000 mg | ORAL_CAPSULE | Freq: Three times a day (TID) | ORAL | 3 refills | Status: DC

## 2019-08-07 MED ORDER — BUPIVACAINE LIPOSOME 1.3 % IJ SUSP
20.0000 mL | INTRAMUSCULAR | Status: DC
  Filled 2019-08-07: qty 20

## 2019-08-07 MED ORDER — OXYCODONE HCL 5 MG PO TABS: 5.0000 mg | ORAL_TABLET | Freq: Three times a day (TID) | ORAL | 0 refills | Status: DC | PRN

## 2019-08-07 MED ORDER — METHOCARBAMOL 750 MG PO TABS: 750.0000 mg | ORAL_TABLET | Freq: Two times a day (BID) | ORAL | 3 refills | Status: DC | PRN

## 2019-08-07 MED ORDER — TRANEXAMIC ACID-NACL 1000-0.7 MG/100ML-% IV SOLN
1000.0000 mg | INTRAVENOUS | Status: AC
  Administered 2019-08-08: 1000 mg via INTRAVENOUS
  Filled 2019-08-07: qty 100

## 2019-08-07 MED ORDER — SENNOSIDES-DOCUSATE SODIUM 8.6-50 MG PO TABS: 1.0000 | ORAL_TABLET | Freq: Every evening | ORAL | 1 refills | Status: DC | PRN

## 2019-08-07 MED ORDER — TRANEXAMIC ACID 1000 MG/10ML IV SOLN
2000.0000 mg | INTRAVENOUS | Status: AC
  Administered 2019-08-08: 2000 mg via TOPICAL
  Filled 2019-08-07: qty 20

## 2019-08-07 NOTE — Anesthesia Preprocedure Evaluation (Signed)
Anesthesia Evaluation  Patient identified by MRN, date of birth, ID band Patient awake    Reviewed: Allergy & Precautions, NPO status , Patient's Chart, lab work & pertinent test results  History of Anesthesia Complications Negative for: history of anesthetic complications  Airway Mallampati: II  TM Distance: >3 FB Neck ROM: Full    Dental no notable dental hx. (+) Dental Advisory Given   Pulmonary COPD,  COPD inhaler, Patient abstained from smoking.,  12/28/2018 SARS coronavirus NEG   breath sounds clear to auscultation       Cardiovascular negative cardio ROS   Rhythm:Regular Rate:Normal     Neuro/Psych PSYCHIATRIC DISORDERS Anxiety Depression Chronic back and leg pain: tramadol    GI/Hepatic Neg liver ROS, GERD  Medicated and Controlled,  Endo/Other  negative endocrine ROS  Renal/GU negative Renal ROS  negative genitourinary   Musculoskeletal  (+) Arthritis , Osteoarthritis,    Abdominal   Peds  Hematology negative hematology ROS (+) plt 285, hct 41   Anesthesia Other Findings   Reproductive/Obstetrics negative OB ROS S/p BTL                            Anesthesia Physical  Anesthesia Plan  ASA: II  Anesthesia Plan: Spinal and Regional   Post-op Pain Management:    Induction:   PONV Risk Score and Plan: 3 and Scopolamine patch - Pre-op, Dexamethasone, Ondansetron, Treatment may vary due to age or medical condition and Midazolam  Airway Management Planned: Simple Face Mask and Natural Airway  Additional Equipment: None  Intra-op Plan:   Post-operative Plan:   Informed Consent: I have reviewed the patients History and Physical, chart, labs and discussed the procedure including the risks, benefits and alternatives for the proposed anesthesia with the patient or authorized representative who has indicated his/her understanding and acceptance.       Plan Discussed with:  CRNA  Anesthesia Plan Comments:         Anesthesia Quick Evaluation

## 2019-08-08 ENCOUNTER — Other Ambulatory Visit: Payer: Self-pay

## 2019-08-08 ENCOUNTER — Observation Stay (HOSPITAL_COMMUNITY)
Admission: RE | Admit: 2019-08-08 | Discharge: 2019-08-09 | Disposition: A | Discharge status: Home / Self Care | Payer: Medicaid Other | Attending: Orthopaedic Surgery | Admitting: Orthopaedic Surgery

## 2019-08-08 ENCOUNTER — Observation Stay (HOSPITAL_COMMUNITY): Payer: Medicaid Other

## 2019-08-08 ENCOUNTER — Encounter (HOSPITAL_COMMUNITY)
Admission: RE | Disposition: A | Discharge status: Home / Self Care | Payer: Self-pay | Source: Home / Self Care | Attending: Orthopaedic Surgery

## 2019-08-08 ENCOUNTER — Ambulatory Visit (HOSPITAL_COMMUNITY): Payer: Medicaid Other | Admitting: Physician Assistant

## 2019-08-08 ENCOUNTER — Ambulatory Visit (HOSPITAL_COMMUNITY): Payer: Medicaid Other | Admitting: Anesthesiology

## 2019-08-08 ENCOUNTER — Encounter (HOSPITAL_COMMUNITY): Payer: Self-pay | Admitting: Orthopaedic Surgery

## 2019-08-08 DIAGNOSIS — K219 Gastro-esophageal reflux disease without esophagitis: Secondary | ICD-10-CM | POA: Insufficient documentation

## 2019-08-08 DIAGNOSIS — Z7951 Long term (current) use of inhaled steroids: Secondary | ICD-10-CM | POA: Insufficient documentation

## 2019-08-08 DIAGNOSIS — J45909 Unspecified asthma, uncomplicated: Secondary | ICD-10-CM | POA: Insufficient documentation

## 2019-08-08 DIAGNOSIS — E669 Obesity, unspecified: Secondary | ICD-10-CM | POA: Insufficient documentation

## 2019-08-08 DIAGNOSIS — M1711 Unilateral primary osteoarthritis, right knee: Secondary | ICD-10-CM | POA: Diagnosis present

## 2019-08-08 DIAGNOSIS — Z888 Allergy status to other drugs, medicaments and biological substances status: Secondary | ICD-10-CM | POA: Insufficient documentation

## 2019-08-08 DIAGNOSIS — Z886 Allergy status to analgesic agent status: Secondary | ICD-10-CM | POA: Insufficient documentation

## 2019-08-08 DIAGNOSIS — F329 Major depressive disorder, single episode, unspecified: Secondary | ICD-10-CM | POA: Diagnosis not present

## 2019-08-08 DIAGNOSIS — Z885 Allergy status to narcotic agent status: Secondary | ICD-10-CM | POA: Insufficient documentation

## 2019-08-08 DIAGNOSIS — F419 Anxiety disorder, unspecified: Secondary | ICD-10-CM | POA: Diagnosis not present

## 2019-08-08 DIAGNOSIS — Z79899 Other long term (current) drug therapy: Secondary | ICD-10-CM | POA: Insufficient documentation

## 2019-08-08 DIAGNOSIS — Z6826 Body mass index (BMI) 26.0-26.9, adult: Secondary | ICD-10-CM | POA: Insufficient documentation

## 2019-08-08 DIAGNOSIS — Z7901 Long term (current) use of anticoagulants: Secondary | ICD-10-CM | POA: Insufficient documentation

## 2019-08-08 DIAGNOSIS — Z96651 Presence of right artificial knee joint: Secondary | ICD-10-CM

## 2019-08-08 DIAGNOSIS — Z881 Allergy status to other antibiotic agents status: Secondary | ICD-10-CM | POA: Diagnosis not present

## 2019-08-08 HISTORY — PX: TOTAL KNEE ARTHROPLASTY: SHX125

## 2019-08-08 SURGERY — ARTHROPLASTY, KNEE, TOTAL
Anesthesia: Regional | Site: Knee | Laterality: Right

## 2019-08-08 MED ORDER — DIPHENHYDRAMINE HCL 50 MG/ML IJ SOLN
INTRAMUSCULAR | Status: DC | PRN
  Administered 2019-08-08: 12.5 mg via INTRAVENOUS

## 2019-08-08 MED ORDER — SODIUM CHLORIDE 0.9 % IV SOLN: INTRAVENOUS | Status: DC

## 2019-08-08 MED ORDER — MEPERIDINE HCL 25 MG/ML IJ SOLN: 6.2500 mg | INTRAMUSCULAR | Status: DC | PRN

## 2019-08-08 MED ORDER — CELECOXIB 200 MG PO CAPS: 200.0000 mg | ORAL_CAPSULE | Freq: Two times a day (BID) | ORAL | Status: DC

## 2019-08-08 MED ORDER — CEFAZOLIN SODIUM-DEXTROSE 2-4 GM/100ML-% IV SOLN
2.0000 g | INTRAVENOUS | Status: AC
  Administered 2019-08-08: 07:00:00 2 g via INTRAVENOUS
  Filled 2019-08-08: qty 100

## 2019-08-08 MED ORDER — LIDOCAINE 2% (20 MG/ML) 5 ML SYRINGE
INTRAMUSCULAR | Status: DC | PRN
  Administered 2019-08-08: 40 mg via INTRAVENOUS

## 2019-08-08 MED ORDER — ROPIVACAINE HCL 5 MG/ML IJ SOLN
INTRAMUSCULAR | Status: DC | PRN
  Administered 2019-08-08: 30 mg via PERINEURAL

## 2019-08-08 MED ORDER — EPINEPHRINE PF 1 MG/ML IJ SOLN
INTRAMUSCULAR | Status: AC
  Filled 2019-08-08: qty 1

## 2019-08-08 MED ORDER — LACTATED RINGERS IV SOLN: INTRAVENOUS | Status: DC

## 2019-08-08 MED ORDER — LACTATED RINGERS IV BOLUS: 250.0000 mL | Freq: Once | INTRAVENOUS | Status: DC

## 2019-08-08 MED ORDER — CHLORHEXIDINE GLUCONATE 4 % EX LIQD: 60.0000 mL | Freq: Once | CUTANEOUS | Status: DC

## 2019-08-08 MED ORDER — TRANEXAMIC ACID-NACL 1000-0.7 MG/100ML-% IV SOLN
1000.0000 mg | Freq: Once | INTRAVENOUS | Status: AC
  Administered 2019-08-08: 12:00:00 1000 mg via INTRAVENOUS
  Filled 2019-08-08: qty 100

## 2019-08-08 MED ORDER — KETOROLAC TROMETHAMINE 30 MG/ML IJ SOLN
30.0000 mg | Freq: Once | INTRAMUSCULAR | Status: AC | PRN
  Administered 2019-08-08: 10:00:00 30 mg via INTRAVENOUS

## 2019-08-08 MED ORDER — MIDAZOLAM HCL 5 MG/5ML IJ SOLN
INTRAMUSCULAR | Status: DC | PRN
  Administered 2019-08-08: 2 mg via INTRAVENOUS

## 2019-08-08 MED ORDER — SODIUM CHLORIDE 0.9% FLUSH
INTRAVENOUS | Status: DC | PRN
  Administered 2019-08-08: 20 mL

## 2019-08-08 MED ORDER — KETOROLAC TROMETHAMINE 30 MG/ML IJ SOLN: 30.0000 mg | Freq: Four times a day (QID) | INTRAMUSCULAR | Status: DC | PRN

## 2019-08-08 MED ORDER — FENTANYL CITRATE (PF) 250 MCG/5ML IJ SOLN
INTRAMUSCULAR | Status: DC | PRN
  Administered 2019-08-08 (×2): 50 ug via INTRAVENOUS

## 2019-08-08 MED ORDER — MIDAZOLAM HCL 2 MG/2ML IJ SOLN
INTRAMUSCULAR | Status: AC
  Filled 2019-08-08: qty 2

## 2019-08-08 MED ORDER — DEXAMETHASONE SODIUM PHOSPHATE 10 MG/ML IJ SOLN
10.0000 mg | Freq: Once | INTRAMUSCULAR | Status: AC
  Administered 2019-08-09: 10:00:00 10 mg via INTRAVENOUS
  Filled 2019-08-08: qty 1

## 2019-08-08 MED ORDER — BUPIVACAINE IN DEXTROSE 0.75-8.25 % IT SOLN
INTRATHECAL | Status: DC | PRN
  Administered 2019-08-08: 1.6 mL via INTRATHECAL

## 2019-08-08 MED ORDER — METHOCARBAMOL 500 MG PO TABS
ORAL_TABLET | ORAL | Status: AC
  Filled 2019-08-08: qty 1

## 2019-08-08 MED ORDER — KETOROLAC TROMETHAMINE 30 MG/ML IJ SOLN
INTRAMUSCULAR | Status: AC
  Filled 2019-08-08: qty 1

## 2019-08-08 MED ORDER — DOCUSATE SODIUM 100 MG PO CAPS
100.0000 mg | ORAL_CAPSULE | Freq: Two times a day (BID) | ORAL | Status: DC
  Administered 2019-08-08 – 2019-08-09 (×2): 100 mg via ORAL
  Filled 2019-08-08 (×2): qty 1

## 2019-08-08 MED ORDER — ALBUTEROL SULFATE (2.5 MG/3ML) 0.083% IN NEBU: 3.0000 mL | INHALATION_SOLUTION | Freq: Four times a day (QID) | RESPIRATORY_TRACT | Status: DC | PRN

## 2019-08-08 MED ORDER — METOCLOPRAMIDE HCL 5 MG/ML IJ SOLN: 5.0000 mg | Freq: Three times a day (TID) | INTRAMUSCULAR | Status: DC | PRN

## 2019-08-08 MED ORDER — ONDANSETRON HCL 4 MG PO TABS: 4.0000 mg | ORAL_TABLET | Freq: Four times a day (QID) | ORAL | Status: DC | PRN

## 2019-08-08 MED ORDER — PHENOL 1.4 % MT LIQD: 1.0000 | OROMUCOSAL | Status: DC | PRN

## 2019-08-08 MED ORDER — SODIUM CHLORIDE 0.9 % IR SOLN
Status: DC | PRN
  Administered 2019-08-08: 3000 mL

## 2019-08-08 MED ORDER — HYDROMORPHONE HCL 1 MG/ML IJ SOLN
INTRAMUSCULAR | Status: AC
  Filled 2019-08-08: qty 1

## 2019-08-08 MED ORDER — OXYCODONE HCL ER 10 MG PO T12A
10.0000 mg | EXTENDED_RELEASE_TABLET | Freq: Two times a day (BID) | ORAL | Status: DC
  Administered 2019-08-08 – 2019-08-09 (×3): 10 mg via ORAL
  Filled 2019-08-08 (×3): qty 1

## 2019-08-08 MED ORDER — MAGNESIUM CITRATE PO SOLN: 1.0000 | Freq: Once | ORAL | Status: DC | PRN

## 2019-08-08 MED ORDER — METHOCARBAMOL 1000 MG/10ML IJ SOLN
500.0000 mg | Freq: Four times a day (QID) | INTRAVENOUS | Status: DC | PRN
  Filled 2019-08-08: qty 5

## 2019-08-08 MED ORDER — LACTATED RINGERS IV BOLUS: 500.0000 mL | Freq: Once | INTRAVENOUS | Status: DC

## 2019-08-08 MED ORDER — PROMETHAZINE HCL 25 MG/ML IJ SOLN: 6.2500 mg | INTRAMUSCULAR | Status: DC | PRN

## 2019-08-08 MED ORDER — BUSPIRONE HCL 10 MG PO TABS: 10.0000 mg | ORAL_TABLET | Freq: Every day | ORAL | Status: DC | PRN

## 2019-08-08 MED ORDER — METHOCARBAMOL 500 MG PO TABS
500.0000 mg | ORAL_TABLET | Freq: Four times a day (QID) | ORAL | Status: DC | PRN
  Administered 2019-08-08 – 2019-08-09 (×2): 500 mg via ORAL
  Filled 2019-08-08: qty 1

## 2019-08-08 MED ORDER — ACETAMINOPHEN 500 MG PO TABS
1000.0000 mg | ORAL_TABLET | Freq: Once | ORAL | Status: AC
  Administered 2019-08-08: 07:00:00 1000 mg via ORAL
  Filled 2019-08-08: qty 2

## 2019-08-08 MED ORDER — VANCOMYCIN HCL 1000 MG IV SOLR
INTRAVENOUS | Status: AC
  Filled 2019-08-08: qty 1000

## 2019-08-08 MED ORDER — FENTANYL CITRATE (PF) 250 MCG/5ML IJ SOLN
INTRAMUSCULAR | Status: AC
  Filled 2019-08-08: qty 5

## 2019-08-08 MED ORDER — BUPIVACAINE HCL (PF) 0.25 % IJ SOLN
INTRAMUSCULAR | Status: AC
  Filled 2019-08-08: qty 30

## 2019-08-08 MED ORDER — POLYETHYLENE GLYCOL 3350 17 G PO PACK: 17.0000 g | PACK | Freq: Every day | ORAL | Status: DC | PRN

## 2019-08-08 MED ORDER — SORBITOL 70 % SOLN: 30.0000 mL | Freq: Every day | Status: DC | PRN

## 2019-08-08 MED ORDER — DEXMEDETOMIDINE HCL 200 MCG/2ML IV SOLN
INTRAVENOUS | Status: DC | PRN
  Administered 2019-08-08 (×2): 4 ug via INTRAVENOUS
  Administered 2019-08-08 (×2): 6 ug via INTRAVENOUS
  Administered 2019-08-08: 4 ug via INTRAVENOUS

## 2019-08-08 MED ORDER — 0.9 % SODIUM CHLORIDE (POUR BTL) OPTIME
TOPICAL | Status: DC | PRN
  Administered 2019-08-08: 1000 mL

## 2019-08-08 MED ORDER — PROPOFOL 500 MG/50ML IV EMUL
INTRAVENOUS | Status: DC | PRN
  Administered 2019-08-08: 75 ug/kg/min via INTRAVENOUS

## 2019-08-08 MED ORDER — DIPHENHYDRAMINE HCL 12.5 MG/5ML PO ELIX
25.0000 mg | ORAL_SOLUTION | ORAL | Status: DC | PRN
  Administered 2019-08-09: 25 mg via ORAL
  Filled 2019-08-08: qty 10

## 2019-08-08 MED ORDER — PANTOPRAZOLE SODIUM 40 MG PO TBEC
40.0000 mg | DELAYED_RELEASE_TABLET | Freq: Every day | ORAL | Status: DC
  Administered 2019-08-09: 40 mg via ORAL
  Filled 2019-08-08: qty 1

## 2019-08-08 MED ORDER — RIVAROXABAN 10 MG PO TABS
10.0000 mg | ORAL_TABLET | Freq: Every day | ORAL | Status: DC
  Administered 2019-08-09: 10:00:00 10 mg via ORAL
  Filled 2019-08-08: qty 1

## 2019-08-08 MED ORDER — BUPROPION HCL ER (XL) 150 MG PO TB24
300.0000 mg | ORAL_TABLET | Freq: Every day | ORAL | Status: DC
  Administered 2019-08-08: 22:00:00 300 mg via ORAL
  Filled 2019-08-08: qty 2

## 2019-08-08 MED ORDER — ACETAMINOPHEN 325 MG PO TABS: 325.0000 mg | ORAL_TABLET | Freq: Four times a day (QID) | ORAL | Status: DC | PRN

## 2019-08-08 MED ORDER — ACETAMINOPHEN 500 MG PO TABS
1000.0000 mg | ORAL_TABLET | Freq: Four times a day (QID) | ORAL | Status: AC
  Administered 2019-08-08 – 2019-08-09 (×4): 1000 mg via ORAL
  Filled 2019-08-08 (×4): qty 2

## 2019-08-08 MED ORDER — PROPOFOL 10 MG/ML IV BOLUS
INTRAVENOUS | Status: DC | PRN
  Administered 2019-08-08: 10 mg via INTRAVENOUS
  Administered 2019-08-08: 20 mg via INTRAVENOUS
  Administered 2019-08-08: 10 mg via INTRAVENOUS
  Administered 2019-08-08: 20 mg via INTRAVENOUS

## 2019-08-08 MED ORDER — BUPIVACAINE-EPINEPHRINE 0.25% -1:200000 IJ SOLN
INTRAMUSCULAR | Status: DC | PRN
  Administered 2019-08-08: 20 mL

## 2019-08-08 MED ORDER — FENTANYL CITRATE (PF) 100 MCG/2ML IJ SOLN: 50.0000 ug | INTRAMUSCULAR | Status: DC | PRN

## 2019-08-08 MED ORDER — MONTELUKAST SODIUM 10 MG PO TABS: 10.0000 mg | ORAL_TABLET | Freq: Every day | ORAL | Status: DC | PRN

## 2019-08-08 MED ORDER — PROPOFOL 10 MG/ML IV BOLUS
INTRAVENOUS | Status: AC
  Filled 2019-08-08: qty 20

## 2019-08-08 MED ORDER — DEXAMETHASONE SODIUM PHOSPHATE 10 MG/ML IJ SOLN
INTRAMUSCULAR | Status: DC | PRN
  Administered 2019-08-08: 8 mg via INTRAVENOUS

## 2019-08-08 MED ORDER — PHENYLEPHRINE HCL (PRESSORS) 10 MG/ML IV SOLN
INTRAVENOUS | Status: DC | PRN
  Administered 2019-08-08 (×5): 80 ug via INTRAVENOUS

## 2019-08-08 MED ORDER — VANCOMYCIN HCL 1000 MG IV SOLR
INTRAVENOUS | Status: DC | PRN
  Administered 2019-08-08: 1000 mg via TOPICAL

## 2019-08-08 MED ORDER — CEFAZOLIN SODIUM-DEXTROSE 2-4 GM/100ML-% IV SOLN
2.0000 g | Freq: Four times a day (QID) | INTRAVENOUS | Status: AC
  Administered 2019-08-08 (×3): 2 g via INTRAVENOUS
  Filled 2019-08-08 (×3): qty 100

## 2019-08-08 MED ORDER — OXYCODONE HCL 5 MG PO TABS: 5.0000 mg | ORAL_TABLET | Freq: Once | ORAL | Status: DC | PRN

## 2019-08-08 MED ORDER — ALUM & MAG HYDROXIDE-SIMETH 200-200-20 MG/5ML PO SUSP: 30.0000 mL | ORAL | Status: DC | PRN

## 2019-08-08 MED ORDER — OXYCODONE HCL 5 MG PO TABS
10.0000 mg | ORAL_TABLET | ORAL | Status: DC | PRN
  Administered 2019-08-08: 10 mg via ORAL
  Administered 2019-08-08 – 2019-08-09 (×4): 15 mg via ORAL
  Filled 2019-08-08 (×4): qty 3

## 2019-08-08 MED ORDER — HYDROMORPHONE HCL 1 MG/ML IJ SOLN
0.2500 mg | INTRAMUSCULAR | Status: DC | PRN
  Administered 2019-08-08 (×2): 0.5 mg via INTRAVENOUS

## 2019-08-08 MED ORDER — MENTHOL 3 MG MT LOZG: 1.0000 | LOZENGE | OROMUCOSAL | Status: DC | PRN

## 2019-08-08 MED ORDER — METOCLOPRAMIDE HCL 5 MG PO TABS: 5.0000 mg | ORAL_TABLET | Freq: Three times a day (TID) | ORAL | Status: DC | PRN

## 2019-08-08 MED ORDER — HYDROMORPHONE HCL 1 MG/ML IJ SOLN
0.5000 mg | INTRAMUSCULAR | Status: DC | PRN
  Administered 2019-08-09: 06:00:00 1 mg via INTRAVENOUS
  Filled 2019-08-08: qty 1

## 2019-08-08 MED ORDER — ONDANSETRON HCL 4 MG/2ML IJ SOLN
4.0000 mg | Freq: Four times a day (QID) | INTRAMUSCULAR | Status: DC | PRN
  Administered 2019-08-08: 4 mg via INTRAVENOUS
  Filled 2019-08-08: qty 2

## 2019-08-08 MED ORDER — OXYCODONE HCL 5 MG PO TABS
5.0000 mg | ORAL_TABLET | ORAL | Status: DC | PRN
  Filled 2019-08-08: qty 2

## 2019-08-08 MED ORDER — GABAPENTIN 300 MG PO CAPS
300.0000 mg | ORAL_CAPSULE | Freq: Three times a day (TID) | ORAL | Status: DC
  Administered 2019-08-08 – 2019-08-09 (×3): 300 mg via ORAL
  Filled 2019-08-08 (×3): qty 1

## 2019-08-08 MED ORDER — POVIDONE-IODINE 10 % EX SWAB: 2.0000 "application " | Freq: Once | CUTANEOUS | Status: DC

## 2019-08-08 MED ORDER — MIDAZOLAM HCL 2 MG/2ML IJ SOLN: 1.0000 mg | INTRAMUSCULAR | Status: DC | PRN

## 2019-08-08 MED ORDER — SCOPOLAMINE 1 MG/3DAYS TD PT72
1.0000 | MEDICATED_PATCH | TRANSDERMAL | Status: DC
  Administered 2019-08-08: 07:00:00 1.5 mg via TRANSDERMAL
  Filled 2019-08-08: qty 1

## 2019-08-08 MED ORDER — OXYCODONE HCL 5 MG/5ML PO SOLN: 5.0000 mg | Freq: Once | ORAL | Status: DC | PRN

## 2019-08-08 SURGICAL SUPPLY — 77 items
ALCOHOL 70% 16 OZ (MISCELLANEOUS) ×3 IMPLANT
BAG DECANTER FOR FLEXI CONT (MISCELLANEOUS) ×3 IMPLANT
BANDAGE ESMARK 6X9 LF (GAUZE/BANDAGES/DRESSINGS) ×1 IMPLANT
BLADE SAG 18X100X1.27 (BLADE) ×3 IMPLANT
BNDG ELASTIC 6X10 VLCR STRL LF (GAUZE/BANDAGES/DRESSINGS) ×3 IMPLANT
BNDG ELASTIC 6X15 VLCR STRL LF (GAUZE/BANDAGES/DRESSINGS) ×3 IMPLANT
BNDG ESMARK 6X9 LF (GAUZE/BANDAGES/DRESSINGS) ×3
BOWL SMART MIX CTS (DISPOSABLE) IMPLANT
CLOSURE STERI-STRIP 1/2X4 (GAUZE/BANDAGES/DRESSINGS) ×1
CLSR STERI-STRIP ANTIMIC 1/2X4 (GAUZE/BANDAGES/DRESSINGS) ×2 IMPLANT
COMP FEMORAL CMTL TRIATH SZ2 (Joint) ×3 IMPLANT
COMPONENT FEMRL CMTL TRITH SZ2 (Joint) ×1 IMPLANT
COVER SURGICAL LIGHT HANDLE (MISCELLANEOUS) ×3 IMPLANT
COVER WAND RF STERILE (DRAPES) IMPLANT
CUFF TOURN SGL QUICK 34 (TOURNIQUET CUFF) ×2
CUFF TOURN SGL QUICK 42 (TOURNIQUET CUFF) IMPLANT
CUFF TRNQT CYL 34X4.125X (TOURNIQUET CUFF) ×1 IMPLANT
DERMABOND ADVANCED (GAUZE/BANDAGES/DRESSINGS) ×2
DERMABOND ADVANCED .7 DNX12 (GAUZE/BANDAGES/DRESSINGS) ×1 IMPLANT
DRAPE EXTREMITY T 121X128X90 (DISPOSABLE) ×3 IMPLANT
DRAPE HALF SHEET 40X57 (DRAPES) ×3 IMPLANT
DRAPE INCISE IOBAN 66X45 STRL (DRAPES) IMPLANT
DRAPE ORTHO SPLIT 77X108 STRL (DRAPES) ×4
DRAPE POUCH INSTRU U-SHP 10X18 (DRAPES) ×3 IMPLANT
DRAPE SURG ORHT 6 SPLT 77X108 (DRAPES) ×2 IMPLANT
DRAPE U-SHAPE 47X51 STRL (DRAPES) ×6 IMPLANT
DRSG PAD ABDOMINAL 8X10 ST (GAUZE/BANDAGES/DRESSINGS) ×6 IMPLANT
DURAPREP 26ML APPLICATOR (WOUND CARE) ×9 IMPLANT
ELECT CAUTERY BLADE 6.4 (BLADE) ×3 IMPLANT
ELECT REM PT RETURN 9FT ADLT (ELECTROSURGICAL) ×3
ELECTRODE REM PT RTRN 9FT ADLT (ELECTROSURGICAL) ×1 IMPLANT
GAUZE SPONGE 4X4 12PLY STRL (GAUZE/BANDAGES/DRESSINGS) ×3 IMPLANT
GLOVE BIOGEL PI IND STRL 7.0 (GLOVE) ×1 IMPLANT
GLOVE BIOGEL PI INDICATOR 7.0 (GLOVE) ×2
GLOVE ECLIPSE 7.0 STRL STRAW (GLOVE) ×9 IMPLANT
GLOVE SKINSENSE NS SZ7.5 (GLOVE) ×6
GLOVE SKINSENSE STRL SZ7.5 (GLOVE) ×3 IMPLANT
GLOVE SURG SYN 7.5  E (GLOVE) ×8
GLOVE SURG SYN 7.5 E (GLOVE) ×4 IMPLANT
GOWN STRL REIN XL XLG (GOWN DISPOSABLE) ×3 IMPLANT
GOWN STRL REUS W/ TWL LRG LVL3 (GOWN DISPOSABLE) ×1 IMPLANT
GOWN STRL REUS W/TWL LRG LVL3 (GOWN DISPOSABLE) ×2
HANDPIECE INTERPULSE COAX TIP (DISPOSABLE) ×2
HOOD PEEL AWAY FLYTE STAYCOOL (MISCELLANEOUS) ×6 IMPLANT
INSERT TIB CS TRIATH X3 9 (Insert) ×3 IMPLANT
KIT BASIN OR (CUSTOM PROCEDURE TRAY) ×3 IMPLANT
KIT TURNOVER KIT B (KITS) ×3 IMPLANT
KNEE PATELLA ASYMMETRIC 9X29 (Knees) ×3 IMPLANT
KNEE TIBIAL COMPONENT SZ3 (Knees) ×3 IMPLANT
MANIFOLD NEPTUNE II (INSTRUMENTS) ×3 IMPLANT
MARKER SKIN DUAL TIP RULER LAB (MISCELLANEOUS) ×3 IMPLANT
NEEDLE SPNL 18GX3.5 QUINCKE PK (NEEDLE) ×6 IMPLANT
NS IRRIG 1000ML POUR BTL (IV SOLUTION) ×3 IMPLANT
PACK TOTAL JOINT (CUSTOM PROCEDURE TRAY) ×3 IMPLANT
PAD ABD 8X10 STRL (GAUZE/BANDAGES/DRESSINGS) ×6 IMPLANT
PAD ARMBOARD 7.5X6 YLW CONV (MISCELLANEOUS) ×6 IMPLANT
PAD CAST 4YDX4 CTTN HI CHSV (CAST SUPPLIES) ×1 IMPLANT
PADDING CAST COTTON 4X4 STRL (CAST SUPPLIES) ×2
PIN FLUTED HEDLESS FIX 3.5X1/8 (PIN) ×3 IMPLANT
SAW OSC TIP CART 19.5X105X1.3 (SAW) ×3 IMPLANT
SET HNDPC FAN SPRY TIP SCT (DISPOSABLE) ×1 IMPLANT
STAPLER VISISTAT 35W (STAPLE) IMPLANT
SUCTION FRAZIER HANDLE 10FR (MISCELLANEOUS) ×2
SUCTION TUBE FRAZIER 10FR DISP (MISCELLANEOUS) ×1 IMPLANT
SUT ETHILON 2 0 FS 18 (SUTURE) IMPLANT
SUT MNCRL AB 4-0 PS2 18 (SUTURE) IMPLANT
SUT VIC AB 0 CT1 27 (SUTURE) ×4
SUT VIC AB 0 CT1 27XBRD ANBCTR (SUTURE) ×2 IMPLANT
SUT VIC AB 1 CTX 27 (SUTURE) ×9 IMPLANT
SUT VIC AB 2-0 CT1 27 (SUTURE) ×8
SUT VIC AB 2-0 CT1 TAPERPNT 27 (SUTURE) ×4 IMPLANT
SYRINGE REG LEUR 60CC (SYRINGE) ×6 IMPLANT
TOWEL GREEN STERILE (TOWEL DISPOSABLE) ×3 IMPLANT
TOWEL GREEN STERILE FF (TOWEL DISPOSABLE) ×3 IMPLANT
TRAY CATH 16FR W/PLASTIC CATH (SET/KITS/TRAYS/PACK) IMPLANT
UNDERPAD 30X30 (UNDERPADS AND DIAPERS) ×3 IMPLANT
WRAP KNEE MAXI GEL POST OP (GAUZE/BANDAGES/DRESSINGS) ×3 IMPLANT

## 2019-08-08 NOTE — Progress Notes (Signed)
Orthopedic Tech Progress Note Patient Details:  [name] [name] 08-Mar-1981 [id number] Patient was not able to get up to 90 degrees, she said it was a little painful. So we had a conversation with the PACU RN and she wanted to go to 80 degrees and after a hour increase by 5 if not by 10 to reach that 90 degrees.  CPM Right Knee CPM Right Knee: On Right Knee Flexion (Degrees): 0 Right Knee Extension (Degrees): 80 Additional Comments: added ice pack  Post Interventions Patient Tolerated: Well Instructions Provided: Care of device, Adjustment of device  [name] 08/08/2019, 10:17 AM

## 2019-08-08 NOTE — Anesthesia Procedure Notes (Signed)
Spinal  Patient location during procedure: OR Start time: 08/08/2019 7:15 AM End time: 08/08/2019 7:23 AM Staffing Performed: anesthesiologist  Anesthesiologist: Pervis Hocking, DO Preanesthetic Checklist Completed: patient identified, IV checked, risks and benefits discussed, surgical consent, monitors and equipment checked, pre-op evaluation and timeout performed Spinal Block Patient position: sitting Prep: DuraPrep and site prepped and draped Patient monitoring: cardiac monitor, continuous pulse ox and blood pressure Approach: midline Location: L3-4 Injection technique: single-shot Needle Needle type: [place]  Needle gauge: 22 G Needle length: 9 cm Assessment Sensory level: T6 Additional Notes Functioning IV was confirmed and monitors were applied. Sterile prep and drape, including hand hygiene and sterile gloves were used. The patient was positioned and the spine was prepped. The skin was anesthetized with lidocaine.  Free flow of clear CSF was obtained prior to injecting local anesthetic into the CSF.  The spinal needle aspirated freely following injection.  The needle was carefully withdrawn.  The patient tolerated the procedure well.

## 2019-08-08 NOTE — Anesthesia Postprocedure Evaluation (Signed)
Anesthesia Post Note  Patient: [name] [name]  Procedure(s) Performed: RIGHT TOTAL KNEE ARTHROPLASTY (Right Knee)     Patient location during evaluation: PACU Anesthesia Type: Regional, Spinal and MAC Level of consciousness: oriented and awake and alert Pain management: pain level controlled Vital Signs Assessment: post-procedure vital signs reviewed and stable Respiratory status: spontaneous breathing and respiratory function stable Cardiovascular status: blood pressure returned to baseline and stable Postop Assessment: no headache, no backache, no apparent nausea or vomiting and patient able to bend at knees Anesthetic complications: no    Last Vitals:  Vitals:   08/08/19 1000 08/08/19 1009  BP:  123/71  Pulse: 79 74  Resp: 16 13  Temp:    SpO2: 98% 97%    Last Pain:  Vitals:   08/08/19 1000  TempSrc:   PainSc: [place]

## 2019-08-08 NOTE — H&P (Signed)
PREOPERATIVE H&P  Chief Complaint: right knee degenerative joint disease  HPI: [name] [name] is a 39 y.o. female who presents for surgical treatment of right knee degenerative joint disease.  She denies any changes in medical history.  Past Medical History:  Diagnosis Date  . Allergy   . Arthritis    right knee  . Asthma   . Constipation   . Depression   . GERD (gastroesophageal reflux disease)   . History of kidney stones   . Panic disorder   . Pneumonia   . Renal disorder   . Seasonal allergies   . SVD (spontaneous vaginal delivery)    x 3   Past Surgical History:  Procedure Laterality Date  . ABDOMINAL HYSTERECTOMY    . adiana    . BILATERAL SALPINGECTOMY Bilateral 05/31/2013   Procedure: BILATERAL SALPINGECTOMY;  Surgeon: Cheri Fowler, MD;  Location: [facility];  Service: Gynecology;  Laterality: Bilateral;  . BREAST CYST ASPIRATION Right 2015  . COLONOSCOPY    . DILATION AND CURETTAGE OF UTERUS    . DNC    . KIDNEY STONE SURGERY    . KNEE ARTHROSCOPY Right   . KNEE ARTHROSCOPY WITH DRILLING/MICROFRACTURE Right 07/26/2014   Procedure: RIGHT KNEE ARTHROSCOPY WITH CHONDROPLASTY, MICROFRACTURE;  Surgeon: Marianna Payment, MD;  Location: [place];  Service: Orthopedics;  Laterality: Right;  . KNEE ARTHROSCOPY WITH MEDIAL MENISECTOMY Right 07/26/2014   Procedure: KNEE ARTHROSCOPY WITH MEDIAL MENISECTOMY;  Surgeon: Marianna Payment, MD;  Location: [place];  Service: Orthopedics;  Laterality: Right;  . LAPAROSCOPIC ASSISTED VAGINAL HYSTERECTOMY N/A 05/31/2013   Procedure: LAPAROSCOPIC ASSISTED VAGINAL HYSTERECTOMY;  Surgeon: Cheri Fowler, MD;  Location: [facility];  Service: Gynecology;  Laterality: N/A;  . LAPAROSCOPY N/A 08/26/2017   Procedure: LAPAROSCOPY OPERATIVE WITH PERIOTONEAL BIOPSY AND FULGERATION OF ENDOMETRIOSIS;  Surgeon: Cheri Fowler, MD;  Location: [facility];  Service: Gynecology;  Laterality: N/A;  . LUMBAR  LAMINECTOMY/DECOMPRESSION MICRODISCECTOMY Right 12/31/2018   Procedure: MICRODISCECTOMY LUMBAR 5- SACRAL1;  Surgeon: Consuella Lose, MD;  Location: [place];  Service: Neurosurgery;  Laterality: Right;  MICRODISCECTOMY LUMBAR 5- SACRAL1  . PARTIAL HYSTERECTOMY    . TONSILLECTOMY    . TONSILLECTOMY AND ADENOIDECTOMY    . TUBAL LIGATION    . UPPER GI ENDOSCOPY     ulcer   Social History   Socioeconomic History  . Marital status: Divorced    Spouse name: Not on file  . Number of children: Not on file  . Years of education: Not on file  . Highest education level: Not on file  Occupational History  . Not on file  Tobacco Use  . Smoking status: Never Smoker  . Smokeless tobacco: Never Used  Substance and Sexual Activity  . Alcohol use: No    Alcohol/week: 0.0 standard drinks  . Drug use: No  . Sexual activity: Not on file  Other Topics Concern  . Not on file  Social History Narrative  . Not on file   Social Determinants of Health   Financial Resource Strain:   . Difficulty of Paying Living Expenses: Not on file  Food Insecurity:   . Worried About Charity fundraiser in the Last Year: Not on file  . Ran Out of Food in the Last Year: Not on file  Transportation Needs:   . Lack of Transportation (Medical): Not on file  . Lack of Transportation (Non-Medical): Not on file  Physical Activity:   . Days  of Exercise per Week: Not on file  . Minutes of Exercise per Session: Not on file  Stress:   . Feeling of Stress : Not on file  Social Connections:   . Frequency of Communication with Friends and Family: Not on file  . Frequency of Social Gatherings with Friends and Family: Not on file  . Attends Religious Services: Not on file  . Active Member of Clubs or Organizations: Not on file  . Attends Archivist Meetings: Not on file  . Marital Status: Not on file   Family History  Problem Relation Age of Onset  . Hypertension Mother   . Diabetes Mother   . Breast cancer  Mother 65       IDC; s/p lumpectomy, radiation, and arimidex  . Prostate cancer Paternal Grandfather        dx. 50s-60s  . Other Sister        both full sisters also have dense breast tissue  . Breast cancer Maternal Grandmother 83       IDC  . Colon polyps Maternal Grandmother        approx 4-5 colon polyps  . Pancreatic cancer Other        maternal great aunt (MGF's sister) d. pancreatic cancer  . Cancer Other        maternal great aunt (MGF's sister) dx NOS cancer  . Cancer Other        maternal great grandmother (MGF's mother) d. NOS cancer in her 32s  . Cancer Cousin 25       paternal 1st cousin dx. w/ either throat or thyroid cancer; not a smoker  . Breast cancer Other        paternal great aunt (PGF's sister) dx. breast cancer, 42s  . Colon cancer Neg Hx   . Rectal cancer Neg Hx   . Stomach cancer Neg Hx   . Esophageal cancer Neg Hx    Allergies  Allergen Reactions  . Duloxetine Hcl Nausea And Vomiting  . Nitrofurantoin Monohyd Macro Hives and Shortness Of Breath  . Aspirin     Bleeding ulcer  . Morphine And Related Nausea And Vomiting   Prior to Admission medications   Medication Sig Start Date End Date Taking? Authorizing Provider  albuterol (VENTOLIN HFA) 108 (90 Base) MCG/ACT inhaler Inhale 2 puffs into the lungs every 6 (six) hours as needed for wheezing or shortness of breath. Patient taking differently: Inhale 2 puffs into the lungs See admin instructions. Inhale 2 puffs twice daily after using advair doses, may inhale 2 puffs every 6 hours as needed for shortness of breath 02/02/19  Yes Hassell Done, Mary-Margaret, FNP  buPROPion (WELLBUTRIN XL) 300 MG 24 hr tablet Take 300 mg by mouth at bedtime.   Yes [provider]  busPIRone (BUSPAR) 10 MG tablet Take 10 mg by mouth daily as needed (anxiety).   Yes [provider]  Fluticasone-Salmeterol (ADVAIR) 500-50 MCG/DOSE AEPB Inhale 1 puff into the lungs 2 (two) times daily.   Yes [provider]   montelukast (SINGULAIR) 10 MG tablet Take 10 mg by mouth daily as needed (allergies).   Yes [provider]  omeprazole (PRILOSEC) 40 MG capsule Take 40 mg by mouth daily.   Yes [provider]  traMADol (ULTRAM) 50 MG tablet Take 1-2 tablets (50-100 mg total) by mouth daily as needed. Patient taking differently: Take 50 mg by mouth 2 (two) times daily as needed for moderate pain.  07/27/19  Yes ,  Marylynn Pearson, MD  gabapentin (NEURONTIN) 100 MG capsule Take 1-3 capsules (100-300 mg total) by mouth 3 (three) times daily. 08/07/19   Leandrew Koyanagi, MD  methocarbamol (ROBAXIN) 750 MG tablet Take 1 tablet (750 mg total) by mouth 2 (two) times daily as needed for muscle spasms. 08/07/19   Leandrew Koyanagi, MD  oxyCODONE (OXY IR/ROXICODONE) 5 MG immediate release tablet Take 1-2 tablets (5-10 mg total) by mouth every 8 (eight) hours as needed for severe pain. 08/07/19   Leandrew Koyanagi, MD  oxyCODONE (OXYCONTIN) 10 mg 12 hr tablet Take 1 tablet (10 mg total) by mouth every 12 (twelve) hours for 3 days. 08/07/19 08/10/19  Leandrew Koyanagi, MD  rivaroxaban (XARELTO) 10 MG TABS tablet Take 1 tablet (10 mg total) by mouth daily. 08/07/19   Leandrew Koyanagi, MD  senna-docusate (SENOKOT S) 8.6-50 MG tablet Take 1-2 tablets by mouth at bedtime as needed. 08/07/19   Leandrew Koyanagi, MD     Positive ROS: All other systems have been reviewed and were otherwise negative with the exception of those mentioned in the HPI and as above.  Physical Exam: General: Alert, no acute distress Cardiovascular: No pedal edema Respiratory: No cyanosis, no use of accessory musculature GI: abdomen soft Skin: No lesions in the area of chief complaint Neurologic: Sensation intact distally Psychiatric: Patient is competent for consent with normal mood and affect Lymphatic: no lymphedema  MUSCULOSKELETAL: exam stable  Assessment: right knee degenerative joint disease  Plan: Plan for Procedure(s): RIGHT TOTAL KNEE ARTHROPLASTY   The risks benefits and alternatives were discussed with the patient including but not limited to the risks of nonoperative treatment, versus surgical intervention including infection, bleeding, nerve injury,  blood clots, cardiopulmonary complications, morbidity, mortality, among others, and they were willing to proceed.   Preoperative templating of the joint replacement has been completed, documented, and submitted to the Operating Room personnel in order to optimize intra-operative equipment management.   Patient's anticipated LOS is less than 2 midnights, meeting these requirements: - Younger than 84 - Lives within 1 hour of care - Has a competent adult at home to recover with post-op recover - NO history of  - Chronic pain requiring opiods  - Diabetes  - Coronary Artery Disease  - Heart failure  - Heart attack  - Stroke  - DVT/VTE  - Cardiac arrhythmia  - Respiratory Failure/COPD  - Renal failure  - Anemia  - Advanced Liver disease  Eduard Roux, MD   08/08/2019 7:11 AM

## 2019-08-08 NOTE — Anesthesia Procedure Notes (Addendum)
Anesthesia Regional Block: Adductor canal block   Pre-Anesthetic Checklist: ,, timeout performed, Correct Patient, Correct Site, Correct Laterality, Correct Procedure, Correct Position, site marked, Risks and benefits discussed,  Surgical consent,  Pre-op evaluation,  At surgeon's request and post-op pain management  Laterality: Right  Prep: Maximum Sterile Barrier Precautions used, chloraprep       Needles:  Injection technique: Single-shot  Needle Type: Echogenic Stimulator Needle     Needle Length: 9cm  Needle Gauge: 22     Additional Needles:   Procedures:,,,, ultrasound used (permanent image in chart),,,,  Narrative:  Start time: 08/08/2019 6:55 AM End time: 08/08/2019 7:00 AM Injection made incrementally with aspirations every 5 mL.  Performed by: Personally  Anesthesiologist: Pervis Hocking, DO  Additional Notes: Monitors applied. No increased pain on injection. No increased resistance to injection. Injection made in 5cc increments. Good needle visualization. Patient tolerated procedure well.

## 2019-08-08 NOTE — Op Note (Signed)
Total Knee Arthroplasty Procedure Note  Preoperative diagnosis: Right knee osteoarthritis  Postoperative diagnosis:same  Operative procedure: Right total knee arthroplasty. CPT [phone]  Surgeon: N. Eduard Roux, MD  Assist: Madalyn Rob, PA-C; necessary for the timely completion of procedure and due to complexity of procedure.  Anesthesia: Spinal, regional  Tourniquet time: 45 mins  Implants used: [employer] Triatholon Femur: CR 2 Tibia: 3 Patella: 29 mm Polyethylene: 9 mm, UC  Indication: [name] [name] is a 39 y.o. year old female with a history of knee pain. Having failed conservative management, the patient elected to proceed with a total knee arthroplasty.  We have reviewed the risk and benefits of the surgery and they elected to proceed after voicing understanding.  Procedure:  After informed consent was obtained and understanding of the risk were voiced including but not limited to bleeding, infection, damage to surrounding structures including nerves and vessels, blood clots, leg length inequality and the failure to achieve desired results, the operative extremity was marked with verbal confirmation of the patient in the holding area.   The patient was then brought to the operating room and transported to the operating room table in the supine position.  A tourniquet was applied to the operative extremity around the upper thigh. The operative limb was then prepped and draped in the usual sterile fashion and preoperative antibiotics were administered.  A time out was performed prior to the start of surgery confirming the correct extremity, preoperative antibiotic administration, as well as team members, implants and instruments available for the case. Correct surgical site was also confirmed with preoperative radiographs. The limb was then elevated for exsanguination and the tourniquet was inflated. A midline incision was made and a standard medial parapatellar  approach was performed.  The patella was prepared and sized to a 29 mm.  A cover was placed on the patella for protection from retractors.  We then turned our attention to the femur. Posterior cruciate ligament was retained. Start site was drilled in the femur and the intramedullary distal femoral cutting guide was placed, set at 5 degrees valgus, taking 8 mm of distal resection. The distal cut was made. Osteophytes were then removed.  Extension gap was then checked. Next, the proximal tibial cutting guide was placed with appropriate slope, varus/valgus alignment and depth of resection. The proximal tibial cut was made. Gap blocks were then used to assess the extension gap and alignment, and appropriate soft tissue releases were performed. Attention was turned back to the femur, which was sized using the sizing guide to a size 2. Appropriate rotation of the femoral component was determined using epicondylar axis, Whiteside's line, and assessing the flexion gap under ligament tension. The appropriate size 4-in-1 cutting block was placed and cuts were made.  Posterior femoral osteophytes and uncapped bone were then removed with the curved osteotome.  Trial components were placed, and stability was checked in full extension, mid-flexion, and deep flexion. Proper tibial rotation was determined and marked.  The patella tracked well without a lateral release. Trial components were then removed and tibial preparation performed.  The tibia was sized for a size 3 component.  A posterior capsular injection comprising of 20 cc of 1.3% exparel, 20 cc of 0.25% bupivicaine with epi and 20 cc of normal saline was performed for postoperative pain control. The bony surfaces were irrigated with a pulse lavage and then dried. Bone cement was vacuum mixed on the back table, and the final components sized above were cemented into  place. After cement had finished curing, excess cement was removed. The stability of the construct was  re-evaluated throughout a range of motion and found to be acceptable. The trial liner was removed, the knee was copiously irrigated, and the knee was re-evaluated for any excess bone debris. The real polyethylene liner, 9 mm thick, was inserted and checked to ensure the locking mechanism had engaged appropriately. The tourniquet was deflated and hemostasis was achieved. The wound was irrigated with normal saline.  One gram of vancomycin powder was placed in the surgical bed.  Capsular closure was performed with a #1 vicryl, subcutaneous fat closed with a 2.0 vicryl suture, then subcutaneous tissue closed with interrupted 2.0 vicryl suture. The skin was then closed with a 4.0 monocryl. A sterile dressing was applied.  The patient was awakened in the operating room and taken to recovery in stable condition. All sponge, needle, and instrument counts were correct at the end of the case.  Position: supine  Complications: none.  Time Out: performed   Drains/Packing: none  Estimated blood loss: minimal  Returned to Recovery Room: in good condition.   Antibiotics: yes   Mechanical VTE (DVT) Prophylaxis: sequential compression devices, TED thigh-high  Chemical VTE (DVT) Prophylaxis: xarelto  Fluid Replacement  Crystalloid: see anesthesia record Blood: none  FFP: none   Specimens Removed: 1 to pathology   Sponge and Instrument Count Correct? yes   PACU: portable radiograph - knee AP and Lateral   Plan/RTC: Return in 2 weeks for wound check.   Weight Bearing/Load Lower Extremity: full   N. Eduard Roux, MD [facility] 8:51 AM

## 2019-08-08 NOTE — Transfer of Care (Signed)
Immediate Anesthesia Transfer of Care Note  Patient: [name] [name]  Procedure(s) Performed: RIGHT TOTAL KNEE ARTHROPLASTY (Right Knee)  Patient Location: PACU  Anesthesia Type:Regional and Spinal  Level of Consciousness: awake, alert  and oriented  Airway & Oxygen Therapy: Patient Spontanous Breathing and Patient connected to nasal cannula oxygen  Post-op Assessment: Report given to RN and Post -op Vital signs reviewed and stable  Post vital signs: Reviewed and stable  Last Vitals:  Vitals Value Taken Time  BP    Temp    Pulse 71 08/08/19 0928  Resp 20 08/08/19 0928  SpO2 100 % 08/08/19 0928  Vitals shown include unvalidated device data.  Last Pain:  Vitals:   08/08/19 0628  TempSrc:   PainSc: 0-No pain         Complications: No apparent anesthesia complications

## 2019-08-08 NOTE — Plan of Care (Signed)
RNCM was requested to assist by Dr. Erlinda Hong to help with any DME and schedule anticipated HHPT, due to patient most likely being discharged Same day after her Right TKA on 08/08/19. Unable to reach patient prior to surgery. Referral made to [facility] for home CPM/FWW/3in1. They were unable to reach patient to deliver prior to surgery. Referrals made to [facility] for discharge after possible same day discharge. Spoke with Herbie Baltimore, patient's significant other, while patient in surgery today. Coordinated with [employer] rep that they will meet Herbie Baltimore at either his work or at hospital to deliver DME to him. Rep also confirmed that if patient is discharged later today or tomorrow, they will coordinate setting up CPM as well. Herbie Baltimore was agreeable to meeting [employer] rep wherever was agreed upon. Will continue to follow along for further CM needs. Will continue to update [facility] as well regarding anticipated discharge today. Please call with any questions. Jamse Arn, RN, BSN, [place] [phone].

## 2019-08-08 NOTE — Plan of Care (Signed)

## 2019-08-08 NOTE — Progress Notes (Signed)
08/08/19 1744  PT Visit Information  Last PT Received On 08/08/19  Assistance Needed +1  History of Present Illness Pt is a 39 y/o female s/p R TKA. PMH includes GERD and R knee arthroscopy.   Subjective Data  Patient Stated Goal to decrease pain  Precautions  Precautions Knee  Precaution Booklet Issued No  Precaution Comments Verbally reviewed knee precautions with pt.   Restrictions  Weight Bearing Restrictions Yes  RLE Weight Bearing WBAT  Pain Assessment  Pain Assessment Faces  Faces Pain Scale 6  Pain Location R knee  Pain Descriptors / Indicators Aching;Operative site guarding  Pain Intervention(s) Limited activity within patient's tolerance;Monitored during session;Repositioned  Cognition  Arousal/Alertness Awake/alert  Behavior During Therapy WFL for tasks assessed/performed  Overall Cognitive Status Within Functional Limits for tasks assessed  Bed Mobility  Overal bed mobility Needs Assistance  Bed Mobility Supine to Sit;Sit to Supine  Supine to sit Min assist  Sit to supine Min assist  General bed mobility comments Min A for RLE assist. Pt with slightly improved tolerance for mobility.   Transfers  Overall transfer level Needs assistance  Equipment used Rolling walker (2 wheeled)  Transfers Sit to/from [facility]  Sit to Stand Min assist  Stand pivot transfers Min guard  General transfer comment Min A for lift assist and steadying. Min guard and cues for sequencing to transfer to [facility].   Ambulation/Gait  Ambulation/Gait assistance Min guard  Gait Distance (Feet) 15 Feet  Assistive device Rolling walker (2 wheeled)  Gait Pattern/deviations Step-to pattern;Decreased step length - right;Decreased step length - left;Decreased weight shift to right;Antalgic  General Gait Details Slow, antalgic gait secondary to pain. Pt only able to tolerate short distance within the room secondary to pain. Cues for sequencing.   Gait velocity Decreased  Balance   Overall balance assessment Needs assistance  Sitting-balance support No upper extremity supported;Feet supported  Sitting balance-Leahy Scale Fair  Standing balance support Bilateral upper extremity supported;During functional activity  Standing balance-Leahy Scale Poor  Standing balance comment Reliant on BUE support   General Comments  General comments (skin integrity, edema, etc.) Pt's fiance present during session.   PT - End of Session  Equipment Utilized During Treatment Gait belt  Activity Tolerance Patient limited by pain  Patient left in bed;with call bell/phone within reach;with family/visitor present  Nurse Communication Mobility status  CPM Right Knee  CPM Right Knee Off   PT - Assessment/Plan  PT Plan Current plan remains appropriate  PT Visit Diagnosis Difficulty in walking, not elsewhere classified (R26.2);Other abnormalities of gait and mobility (R26.89);Unsteadiness on feet (R26.81);Pain  Pain - Right/Left Right  Pain - part of body Knee  PT Frequency (ACUTE ONLY) 7X/week  Follow Up Recommendations Follow surgeon's recommendation for DC plan and follow-up therapies;Supervision for mobility/OOB  PT equipment None recommended by PT  AM-PAC PT "6 Clicks" Mobility Outcome Measure (Version 2)  Help needed turning from your back to your side while in a flat bed without using bedrails? 3  Help needed moving from lying on your back to sitting on the side of a flat bed without using bedrails? 3  Help needed moving to and from a bed to a chair (including a wheelchair)? 3  Help needed standing up from a chair using your arms (e.g., wheelchair or bedside chair)? 3  Help needed to walk in hospital room? 3  Help needed climbing 3-5 steps with a railing?  2  6 Click Score 17  Consider  Recommendation of Discharge To: Home with [facility]  PT Goal Progression  Progress towards PT goals Progressing toward goals  Acute Rehab PT Goals  PT Goal Formulation With patient  Time For Goal  Achievement 08/22/19  Potential to Achieve Goals Good  PT Time Calculation  PT Start Time (ACUTE ONLY) 1720  PT Stop Time (ACUTE ONLY) 1736  PT Time Calculation (min) (ACUTE ONLY) 16 min  PT General Charges  $$ ACUTE PT VISIT 1 Visit  PT Treatments  $Gait Training 8-22 mins   Pt progressing towards goals. Continues to be limited secondary to pain and only able to tolerate gait within the room. Feel she would benefit from continued PT prior to d/c home to ensure safety with mobility. Will continue to follow acutely to maximize functional mobility independence and safety.   Reuel Derby, PT, DPT  Acute Rehabilitation Services  Pager: [phone] Office: [phone]

## 2019-08-08 NOTE — Evaluation (Signed)
Physical Therapy Evaluation Patient Details Name: [name] [name] MRN: [id number] DOB: 1981-03-16 Today's Date: 08/08/2019   History of Present Illness  Pt is a 39 y/o female s/p R TKA. PMH includes GERD and R knee arthroscopy.   Clinical Impression  Pt is s/p surgery above with deficits below. Pt with increased pain and effects of nerve block which limited mobility. Pt with BLE instability upon standing secondary to nerve block. Required mod A to stand using RW. Given current deficits feel pt would benefit from continued acute PT prior to return home, as she will have 2 flights of steps to complete to get into apartment. Will continue to follow acutely to maximize functional mobility independence and safety.     Follow Up Recommendations Follow surgeon's recommendation for DC plan and follow-up therapies;Supervision for mobility/OOB    Equipment Recommendations  None recommended by PT    Recommendations for Other Services       Precautions / Restrictions Precautions Precautions: Knee Precaution Booklet Issued: No Precaution Comments: Verbally reviewed knee precautions with pt.  Restrictions Weight Bearing Restrictions: Yes RLE Weight Bearing: Weight bearing as tolerated      Mobility  Bed Mobility Overal bed mobility: Needs Assistance Bed Mobility: Supine to Sit;Sit to Supine     Supine to sit: Min assist Sit to supine: Min assist   General bed mobility comments: Min A for RLE assist. Increased time required to perform.   Transfers Overall transfer level: Needs assistance Equipment used: Rolling walker (2 wheeled) Transfers: Sit to/from Stand Sit to Stand: Mod assist         General transfer comment: Mod A for lift assist and steadying. Bilateral knee instability noted secondary to block, therefore further mobility deferred  Ambulation/Gait                Stairs            Wheelchair Mobility    Modified Rankin (Stroke Patients Only)        Balance Overall balance assessment: Needs assistance Sitting-balance support: No upper extremity supported;Feet supported Sitting balance-Leahy Scale: Fair     Standing balance support: Bilateral upper extremity supported;During functional activity Standing balance-Leahy Scale: Poor Standing balance comment: Reliant on BUE support and external support                             Pertinent Vitals/Pain Pain Assessment: 0-10 Pain Score: 9  Pain Location: R knee Pain Descriptors / Indicators: Aching;Operative site guarding Pain Intervention(s): Limited activity within patient's tolerance;Monitored during session;Repositioned    Home Living Family/patient expects to be discharged to:: Private residence Living Arrangements: Spouse/significant other;Children Available Help at Discharge: Family Type of Home: Apartment Home Access: Stairs to enter Entrance Stairs-Rails: Right Entrance Stairs-Number of Steps: 2 flights Home Layout: One level Home Equipment: Environmental consultant - 2 wheels;Bedside commode      Prior Function Level of Independence: Independent               Hand Dominance        Extremity/Trunk Assessment   Upper Extremity Assessment Upper Extremity Assessment: Overall WFL for tasks assessed    Lower Extremity Assessment Lower Extremity Assessment: RLE deficits/detail;LLE deficits/detail RLE Deficits / Details: Deficits consistent with post op pain and weakness. Pt reports some numbness and tingling throughout LLE Deficits / Details: Numbness and tingling in LLE. Knee instability noted in standing.     Cervical / Trunk Assessment Cervical / Trunk  Assessment: Normal  Communication   Communication: No difficulties  Cognition Arousal/Alertness: Awake/alert Behavior During Therapy: WFL for tasks assessed/performed Overall Cognitive Status: Within Functional Limits for tasks assessed                                        General  Comments      Exercises Total Joint Exercises Ankle Circles/Pumps: AROM;Both;20 reps;Supine Quad Sets: AROM;Right;10 reps;Supine Heel Slides: AROM;Right;10 reps;Supine   Assessment/Plan    PT Assessment Patient needs continued PT services  PT Problem List Decreased strength;Decreased range of motion;Decreased activity tolerance;Decreased balance;Decreased mobility;Decreased knowledge of use of DME;Decreased knowledge of precautions;Pain       PT Treatment Interventions Gait training;DME instruction;Stair training;Functional mobility training;Therapeutic activities;Therapeutic exercise;Balance training;Patient/family education    PT Goals (Current goals can be found in the Care Plan section)  Acute Rehab PT Goals Patient Stated Goal: to decrease pain PT Goal Formulation: With patient Time For Goal Achievement: 08/22/19 Potential to Achieve Goals: Good    Frequency 7X/week   Barriers to discharge        Co-evaluation               AM-PAC PT "6 Clicks" Mobility  Outcome Measure Help needed turning from your back to your side while in a flat bed without using bedrails?: A Little Help needed moving from lying on your back to sitting on the side of a flat bed without using bedrails?: A Little Help needed moving to and from a bed to a chair (including a wheelchair)?: A Lot Help needed standing up from a chair using your arms (e.g., wheelchair or bedside chair)?: A Lot Help needed to walk in hospital room?: A Lot Help needed climbing 3-5 steps with a railing? : A Lot 6 Click Score: 14    End of Session Equipment Utilized During Treatment: Gait belt Activity Tolerance: Patient limited by pain Patient left: in bed;with call bell/phone within reach;with nursing/sitter in room Nurse Communication: Mobility status PT Visit Diagnosis: Difficulty in walking, not elsewhere classified (R26.2);Other abnormalities of gait and mobility (R26.89);Unsteadiness on feet  (R26.81);Pain Pain - Right/Left: Right Pain - part of body: Knee    Time: 1151-1224 PT Time Calculation (min) (ACUTE ONLY): 33 min   Charges:   PT Evaluation $PT Eval Low Complexity: 1 Low PT Treatments $Therapeutic Activity: 8-22 mins        Lou Miner, DPT  Acute Rehabilitation Services  Pager: [phone] Office: [phone]   [name] [name] 08/08/2019, 2:06 PM

## 2019-08-09 ENCOUNTER — Encounter: Payer: Self-pay | Admitting: *Deleted

## 2019-08-09 DIAGNOSIS — M1711 Unilateral primary osteoarthritis, right knee: Secondary | ICD-10-CM | POA: Diagnosis not present

## 2019-08-09 LAB — CBC
HCT: 34.1 % — ABNORMAL LOW (ref 36.0–46.0)
Hemoglobin: 10.9 g/dL — ABNORMAL LOW (ref 12.0–15.0)
MCH: 31.2 pg (ref 26.0–34.0)
MCHC: 32 g/dL (ref 30.0–36.0)
MCV: 97.7 fL (ref 80.0–100.0)
Platelets: 206 10*3/uL (ref 150–400)
RBC: 3.49 MIL/uL — ABNORMAL LOW (ref 3.87–5.11)
RDW: 12.4 % (ref 11.5–15.5)
WBC: 10.2 10*3/uL (ref 4.0–10.5)
nRBC: 0 % (ref 0.0–0.2)

## 2019-08-09 LAB — BASIC METABOLIC PANEL
Anion gap: 7 (ref 5–15)
BUN: 10 mg/dL (ref 6–20)
CO2: 24 mmol/L (ref 22–32)
Calcium: 8 mg/dL — ABNORMAL LOW (ref 8.9–10.3)
Chloride: 106 mmol/L (ref 98–111)
Creatinine, Ser: 0.83 mg/dL (ref 0.44–1.00)
GFR calc Af Amer: >60 mL/min (ref >60)
GFR calc non Af Amer: >60 mL/min (ref >60)
Glucose, Bld: 117 mg/dL — ABNORMAL HIGH (ref 70–99)
Potassium: 3.7 mmol/L (ref 3.5–5.1)
Sodium: 137 mmol/L (ref 135–145)

## 2019-08-09 NOTE — Care Management Obs Status (Signed)
[facility] NOTIFICATION   Patient Details  Name: [name] [name] MRN: [id number] Date of Birth: [birth year]   Medicare Observation Status Notification Given:  Yes    Atilano Median, [place] 08/09/2019, 1:35 PM

## 2019-08-09 NOTE — Progress Notes (Signed)
Provided discharge education/instructions, all questions and concerns addressed, Pt not in distress, per Pt DME will be delivered to her home and HHPT will come to her house.Dischrged home with belongings accompanied by husband.

## 2019-08-09 NOTE — Discharge Instructions (Signed)

## 2019-08-09 NOTE — Progress Notes (Addendum)
Physical Therapy Treatment Patient Details Name: [name] [name] MRN: [id number] DOB: 1981/01/22 Today's Date: 08/09/2019    History of Present Illness Pt is a 38 y/o female s/[name] R TKA. PMH includes GERD and R knee arthroscopy.     PT Comments    Pt pleasant and reports increased ease of movement. Pt supine with knee portion bent and pt educated for precautions. Pt with inability to perform SLR without lag and is limited by weakness and pain for mobility and HEP. Pt educated for HEP, stairs, gait and frequency of activity for home. Pt progressing well and will be safe for D/C home today. Pt with heel elevated on foam end of session.     Follow Up Recommendations  Follow surgeon's recommendation for DC plan and follow-up therapies;Supervision for mobility/OOB     Equipment Recommendations       Recommendations for Other Services       Precautions / Restrictions Precautions Precautions: Knee;Fall Precaution Comments: Verbally reviewed knee precautions with pt.  Restrictions RLE Weight Bearing: Weight bearing as tolerated    Mobility  Bed Mobility Overal bed mobility: Modified Independent             General bed mobility comments: pt able to transition to EOb without assist  Transfers Overall transfer level: Needs assistance   Transfers: Sit to/from Stand Sit to Stand: Supervision         General transfer comment: cues for sequence, hand placement and safety  Ambulation/Gait Ambulation/Gait assistance: Min guard Gait Distance (Feet): 200 Feet Assistive device: Rolling walker (2 wheeled) Gait Pattern/deviations: Step-to pattern;Decreased stance time - right;Antalgic   Gait velocity interpretation: 1.31 - 2.62 ft/sec, indicative of limited community ambulator General Gait Details: antalgic gait withpt unable to perform step through pattern due to RLE weakness with partial buckling in stance. cues for safety, sequence and progression   Stairs Stairs:  Yes Stairs assistance: Min assist Stair Management: One rail Right;Step to pattern;Sideways;Backwards;With walker Number of Stairs: 4 General stair comments: performed 4 stairs sideways with rail with minguard with great difficulty and cues for sequence. Then performed 4 stairs backward with RW with min assist with improved sequence and ability with handout provided   Wheelchair Mobility    Modified Rankin (Stroke Patients Only)       Balance Overall balance assessment: No apparent balance deficits (not formally assessed)                                          Cognition Arousal/Alertness: Awake/alert Behavior During Therapy: WFL for tasks assessed/performed Overall Cognitive Status: Within Functional Limits for tasks assessed                                        Exercises Total Joint Exercises Quad Sets: AROM;Right;Seated;10 reps Heel Slides: AAROM;10 reps;Seated;Right Hip ABduction/ADduction: AAROM;Right;Seated;10 reps Straight Leg Raises: AAROM;Right;Seated;10 reps Long Arc Quad: AAROM;Right;Seated;10 reps    General Comments        Pertinent Vitals/Pain Pain Score: 5  Pain Location: Rt knee 5 at rest up to 8 with gait Pain Descriptors / Indicators: Aching;Operative site guarding Pain Intervention(s): Limited activity within patient's tolerance;Premedicated before session;Repositioned;Patient requesting pain meds-RN notified;Monitored during session;Ice applied    Home Living  Prior Function            PT Goals (current goals can now be found in the care plan section) Progress towards PT goals: Progressing toward goals    Frequency           PT Plan Current plan remains appropriate    Co-evaluation              AM-PAC PT "6 Clicks" Mobility   Outcome Measure  Help needed turning from your back to your side while in a flat bed without using bedrails?: None Help needed moving  from lying on your back to sitting on the side of a flat bed without using bedrails?: A Little Help needed moving to and from a bed to a chair (including a wheelchair)?: A Little Help needed standing up from a chair using your arms (e.g., wheelchair or bedside chair)?: A Little Help needed to walk in hospital room?: A Little Help needed climbing 3-5 steps with a railing? : A Little 6 Click Score: 19    End of Session   Activity Tolerance: Patient tolerated treatment well Patient left: in chair;with call bell/phone within reach Nurse Communication: Mobility status PT Visit Diagnosis: Difficulty in walking, not elsewhere classified (R26.2);Other abnormalities of gait and mobility (R26.89);Unsteadiness on feet (R26.81);Pain Pain - Right/Left: Right Pain - part of body: Knee     Time: 0726-0755 PT Time Calculation (min) (ACUTE ONLY): 29 min  Charges:  $Gait Training: 8-22 mins $Therapeutic Exercise: 8-22 mins                     [name] [name], PT Acute Rehabilitation Services Pager: [phone] Office: [phone]    [name] [name] 08/09/2019, 8:03 AM

## 2019-08-09 NOTE — Discharge Summary (Signed)
Patient ID: [name] [name] MRN: [id number] DOB/AGE: 08/20/1980 39 y.o.  Admit date: 08/08/2019 Discharge date: 08/09/2019  Admission Diagnoses:  Principal Problem:   Unilateral primary osteoarthritis, right knee Active Problems:   Status post total right knee replacement   Discharge Diagnoses:  Same  Past Medical History:  Diagnosis Date  . Allergy   . Arthritis    right knee  . Asthma   . Constipation   . Depression   . GERD (gastroesophageal reflux disease)   . History of kidney stones   . Panic disorder   . Pneumonia   . Renal disorder   . Seasonal allergies   . SVD (spontaneous vaginal delivery)    x 3    Surgeries: Procedure(s): RIGHT TOTAL KNEE ARTHROPLASTY on 08/08/2019   Consultants:   Discharged Condition: Improved  Hospital Course: [name] [name] is an 39 y.o. female who was admitted 08/08/2019 for operative treatment ofUnilateral primary osteoarthritis, right knee. Patient has severe unremitting pain that affects sleep, daily activities, and work/hobbies. After pre-op clearance the patient was taken to the operating room on 08/08/2019 and underwent  Procedure(s): RIGHT TOTAL KNEE ARTHROPLASTY.    Patient was given perioperative antibiotics:  Anti-infectives (From admission, onward)   Start     Dose/Rate Route Frequency Ordered Stop   08/08/19 1100  ceFAZolin (ANCEF) IVPB 2g/100 mL premix     2 g 200 mL/hr over 30 Minutes Intravenous Every 6 hours 08/08/19 1055 08/09/19 0006   08/08/19 0756  vancomycin (VANCOCIN) powder  Status:  Discontinued       As needed 08/08/19 0756 08/08/19 0920   08/08/19 0600  ceFAZolin (ANCEF) IVPB 2g/100 mL premix     2 g 200 mL/hr over 30 Minutes Intravenous On call to O.R. 08/08/19 [id number] 08/08/19 0756       Patient was given sequential compression devices, early ambulation, and chemoprophylaxis to prevent DVT.  Patient benefited maximally from hospital stay and there were no complications.    Recent vital signs:   Patient Vitals for the past 24 hrs:  BP Temp Temp src Pulse Resp SpO2  08/09/19 0437 128/70 98.4 F (36.9 C) Oral 70 16 100 %  08/09/19 0007 106/78 98.5 F (36.9 C) Oral 70 16 99 %  08/08/19 2019 117/72 98.5 F (36.9 C) Oral 92 16 100 %  08/08/19 1617 122/75 98.2 F (36.8 C) Oral 78 -- 98 %  08/08/19 1050 125/81 97.8 F (36.6 C) Oral 69 17 99 %  08/08/19 1024 133/88 -- -- 77 14 99 %  08/08/19 1009 123/71 -- -- 74 13 97 %  08/08/19 1000 -- -- -- 79 16 98 %  08/08/19 0950 -- -- -- 78 16 100 %  08/08/19 0948 -- -- -- 71 13 100 %  08/08/19 0945 -- -- -- 73 13 98 %  08/08/19 0940 106/81 -- -- 86 (!) 22 100 %  08/08/19 0930 -- -- -- 77 16 100 %  08/08/19 0925 113/69 (!) 97.5 F (36.4 C) -- 72 16 100 %     Recent laboratory studies:  Recent Labs    08/09/19 0331  WBC 10.2  HGB 10.9*  HCT 34.1*  PLT 206  NA 137  K 3.7  CL 106  CO2 24  BUN 10  CREATININE 0.83  GLUCOSE 117*  CALCIUM 8.0*     Discharge Medications:   Allergies as of 08/09/2019      Reactions   Duloxetine Hcl Nausea And Vomiting   Nitrofurantoin  Monohyd Macro Hives, Shortness Of Breath   Aspirin    Bleeding ulcer   Morphine And Related Nausea And Vomiting      Medication List    STOP taking these medications   traMADol 50 MG tablet Commonly known as: ULTRAM     TAKE these medications   albuterol 108 (90 Base) MCG/ACT inhaler Commonly known as: VENTOLIN HFA Inhale 2 puffs into the lungs every 6 (six) hours as needed for wheezing or shortness of breath. What changed:   when to take this  additional instructions   buPROPion 300 MG 24 hr tablet Commonly known as: WELLBUTRIN XL Take 300 mg by mouth at bedtime.   busPIRone 10 MG tablet Commonly known as: BUSPAR Take 10 mg by mouth daily as needed (anxiety).   Fluticasone-Salmeterol 500-50 MCG/DOSE Aepb Commonly known as: ADVAIR Inhale 1 puff into the lungs 2 (two) times daily.   gabapentin 100 MG capsule Commonly known as:  NEURONTIN Take 1-3 capsules (100-300 mg total) by mouth 3 (three) times daily.   methocarbamol 750 MG tablet Commonly known as: ROBAXIN Take 1 tablet (750 mg total) by mouth 2 (two) times daily as needed for muscle spasms.   montelukast 10 MG tablet Commonly known as: SINGULAIR Take 10 mg by mouth daily as needed (allergies).   omeprazole 40 MG capsule Commonly known as: PRILOSEC Take 40 mg by mouth daily.   oxyCODONE 10 mg 12 hr tablet Commonly known as: OXYCONTIN Take 1 tablet (10 mg total) by mouth every 12 (twelve) hours for 3 days.   oxyCODONE 5 MG immediate release tablet Commonly known as: Oxy IR/ROXICODONE Take 1-2 tablets (5-10 mg total) by mouth every 8 (eight) hours as needed for severe pain.   rivaroxaban 10 MG Tabs tablet Commonly known as: XARELTO Take 1 tablet (10 mg total) by mouth daily.   senna-docusate 8.6-50 MG tablet Commonly known as: Senokot S Take 1-2 tablets by mouth at bedtime as needed.            Durable Medical Equipment  (From admission, onward)         Start     Ordered   08/08/19 1056  DME Walker rolling  Once    Question:  Patient needs a walker to treat with the following condition  Answer:  Total knee replacement status   08/08/19 1055   08/08/19 1056  DME 3 n 1  Once     08/08/19 1055   08/08/19 1056  DME Bedside commode  Once    Question:  Patient needs a bedside commode to treat with the following condition  Answer:  Total knee replacement status   08/08/19 1055          Diagnostic Studies: DG Chest 2 View  Result Date: 08/04/2019 CLINICAL DATA:  Preoperative assessment for right total knee replacement EXAM: CHEST - 2 VIEW COMPARISON:  January 14, 2016 FINDINGS: The lungs are clear. Heart size and pulmonary vascularity are normal. No adenopathy. No bone lesions. IMPRESSION: No abnormality noted. Electronically Signed   By: Lowella Grip III M.D.   On: 08/04/2019 14:12   DG Knee Right Port  Result Date:  08/08/2019 CLINICAL DATA:  Status post right total knee replacement. EXAM: PORTABLE RIGHT KNEE - 1-2 VIEW COMPARISON:  July 27, 2019. FINDINGS: The femoral, tibial and patellar components appear to be well situated. Expected postoperative changes are noted in the soft tissues anteriorly. IMPRESSION: Status post right total knee arthroplasty. Electronically Signed   By: Jeneen Rinks  Murlean Caller M.D.   On: 08/08/2019 09:47   XR KNEE 3 VIEW RIGHT  Result Date: 07/27/2019 Significant medial joint space narrowing and spurring of the patella.  Moderately severe DJD.   Disposition: Discharge disposition: 01-Home or Self Care            Signed: Aundra Dubin 08/09/2019, 7:41 AM

## 2019-08-09 NOTE — Progress Notes (Signed)
Subjective: 1 Day Post-Op Procedure(s) (LRB): RIGHT TOTAL KNEE ARTHROPLASTY (Right) Patient reports pain as moderate.  Up walking and doing stair training with PT.  Only complaint is pain  Objective: Vital signs in last 24 hours: Temp:  [97.5 F (36.4 C)-98.5 F (36.9 C)] 98.4 F (36.9 C) (02/02 0437) Pulse Rate:  [69-92] 70 (02/02 0437) Resp:  [13-22] 16 (02/02 0437) BP: (106-133)/(69-88) 128/70 (02/02 0437) SpO2:  [97 %-100 %] 100 % (02/02 0437)  Intake/Output from previous day: 02/01 0701 - 02/02 0700 In: 1500 [I.V.:1500] Out: 1400 [Urine:1300; Blood:100] Intake/Output this shift: No intake/output data recorded.  Recent Labs    08/09/19 0331  HGB 10.9*   Recent Labs    08/09/19 0331  WBC 10.2  RBC 3.49*  HCT 34.1*  PLT 206   Recent Labs    08/09/19 0331  NA 137  K 3.7  CL 106  CO2 24  BUN 10  CREATININE 0.83  GLUCOSE 117*  CALCIUM 8.0*   No results for input(s): LABPT, INR in the last 72 hours.  Neurologically intact Neurovascular intact Sensation intact distally Intact pulses distally Dorsiflexion/Plantar flexion intact Incision: dressing C/D/I No cellulitis present Compartment soft   Assessment/Plan: 1 Day Post-Op Procedure(s) (LRB): RIGHT TOTAL KNEE ARTHROPLASTY (Right) Advance diet Up with therapy D/C IV fluids Discharge home with home health   Anticipated LOS equal to or greater than 2 midnights due to - Age 5 and older with one or more of the following:  - Obesity  - Expected need for hospital services (PT, OT, Nursing) required for safe  discharge  - Anticipated need for postoperative skilled nursing care or inpatient rehab  - Active co-morbidities: None OR   - Unanticipated findings during/Post Surgery: Slow post-op progression: GI, pain control, mobility  - Patient is a high risk of re-admission due to: None    Aundra Dubin 08/09/2019, 7:38 AM

## 2019-08-09 NOTE — Progress Notes (Signed)
Physical Therapy Treatment Patient Details Name: [name] [name] MRN: [id number] DOB: 1980/12/07 Today's Date: 08/09/2019    History of Present Illness Pt is a 39 y/o female s/p R TKA. PMH includes GERD and R knee arthroscopy.     PT Comments    Pt finishing lunch and eager to mobilize for D/C. Pt with decreased pain after medication this afternoon with increased ability for stairs and continues to have significant lack of strength and ROM Rt knee and educated for positioning, HEP and importance of progression. Pt safe for return home.    Follow Up Recommendations  Follow surgeon's recommendation for DC plan and follow-up therapies;Supervision for mobility/OOB     Equipment Recommendations  None recommended by PT    Recommendations for Other Services       Precautions / Restrictions Precautions Precautions: Knee;Fall Precaution Comments: Verbally reviewed knee precautions with pt.  Restrictions Weight Bearing Restrictions: Yes RLE Weight Bearing: Weight bearing as tolerated    Mobility  Bed Mobility Overal bed mobility: Modified Independent Bed Mobility: Sit to Supine           General bed mobility comments: pt transitioned back to bed with HOB elevated without assist with use of bil UE support to clear RLE onto surface  Transfers Overall transfer level: Modified independent                  Ambulation/Gait Ambulation/Gait assistance: Min guard Gait Distance (Feet): 200 Feet Assistive device: Rolling walker (2 wheeled) Gait Pattern/deviations: Step-to pattern;Decreased stance time - right;Antalgic   Gait velocity interpretation: 1.31 - 2.62 ft/sec, indicative of limited community ambulator General Gait Details: antalgic gait with pt unable to perform step through pattern due to RLE weakness. cues for safety, sequence and progression   Stairs Stairs: Yes Stairs assistance: Min assist Stair Management: Backwards;With walker Number of Stairs:  12 General stair comments: pt needed cues to initiate stair sequence but able to perform full flight with very limited assist for RW stabilization   Wheelchair Mobility    Modified Rankin (Stroke Patients Only)       Balance Overall balance assessment: No apparent balance deficits (not formally assessed)                                          Cognition Arousal/Alertness: Awake/alert Behavior During Therapy: WFL for tasks assessed/performed Overall Cognitive Status: Within Functional Limits for tasks assessed                                        Exercises Total Joint Exercises Quad Sets: AROM;Right;Seated;10 reps Hip ABduction/ADduction: AAROM;Right;Seated;10 reps Straight Leg Raises: AAROM;Right;Seated;10 reps Long Arc Quad: AAROM;Right;Seated;10 reps Goniometric ROM: 12-50    General Comments        Pertinent Vitals/Pain Pain Score: 5  Pain Location: Rt knee Pain Descriptors / Indicators: Aching;Operative site guarding Pain Intervention(s): Limited activity within patient's tolerance;Monitored during session;Premedicated before session;Repositioned;Ice applied    Home Living                      Prior Function            PT Goals (current goals can now be found in the care plan section) Progress towards PT goals: Progressing toward goals    Frequency  7X/week      PT Plan Current plan remains appropriate    Co-evaluation              AM-PAC PT "6 Clicks" Mobility   Outcome Measure  Help needed turning from your back to your side while in a flat bed without using bedrails?: None Help needed moving from lying on your back to sitting on the side of a flat bed without using bedrails?: None Help needed moving to and from a bed to a chair (including a wheelchair)?: A Little Help needed standing up from a chair using your arms (e.g., wheelchair or bedside chair)?: A Little Help needed to walk in  hospital room?: A Little Help needed climbing 3-5 steps with a railing? : A Little 6 Click Score: 20    End of Session   Activity Tolerance: Patient tolerated treatment well Patient left: in bed;with call bell/phone within reach Nurse Communication: Mobility status PT Visit Diagnosis: Difficulty in walking, not elsewhere classified (R26.2);Other abnormalities of gait and mobility (R26.89);Unsteadiness on feet (R26.81);Pain     Time: 1216-1237 PT Time Calculation (min) (ACUTE ONLY): 21 min  Charges:  $Gait Training: 8-22 mins                     Bayard Males, PT Acute Rehabilitation Services Pager: [phone] Office: [place] 08/09/2019, 1:06 PM

## 2019-08-09 NOTE — Addendum Note (Signed)
Addendum  created 08/09/19 1324 by Pervis Hocking, DO   Clinical Note Signed, Intraprocedure Blocks edited

## 2019-08-09 NOTE — Plan of Care (Signed)

## 2019-08-09 NOTE — Clinical Social Work Note (Signed)
CSW consult acknowledged. No discharge needs identified at this time.

## 2019-08-10 ENCOUNTER — Telehealth: Payer: Self-pay

## 2019-08-10 NOTE — Telephone Encounter (Signed)
Nunzio Cory, PT with [facility] would like verbal orders for continuation of therapy for 2 week 1, 3 week 1, and 1 week 1 for ROM, strengthening, mobility, and patient education.  CB# [phone].  Please advise.  Thank you.

## 2019-08-10 NOTE — Telephone Encounter (Signed)
Called to approve orders 

## 2019-08-11 ENCOUNTER — Telehealth: Payer: Self-pay | Admitting: Orthopaedic Surgery

## 2019-08-11 NOTE — Telephone Encounter (Signed)
Ok thanks 

## 2019-08-11 NOTE — Telephone Encounter (Signed)
Patient called and stated she was prescribed several medications roxicodone and couple more.Stating she has misplaced one of them and not sure which one it is. The dog may have eaten one of the medications.  Please call patient [phone].

## 2019-08-11 NOTE — Telephone Encounter (Signed)
Patient called back,stated the Rx's are not lost. They are at the pharmacy and pending prior auth on OxyContin. pts ph [phone]

## 2019-08-12 ENCOUNTER — Telehealth: Payer: Self-pay | Admitting: Orthopaedic Surgery

## 2019-08-12 NOTE — Telephone Encounter (Signed)
Called and advised patient

## 2019-08-12 NOTE — Telephone Encounter (Signed)
Patient called stated had surgery 2 weeks ago and was told not to remove the bandages until she comes back to see Xu. She is confused and hoping she does not get an infection.  Please call patient @ [phone]

## 2019-08-23 ENCOUNTER — Other Ambulatory Visit: Payer: Self-pay

## 2019-08-23 ENCOUNTER — Encounter: Payer: Self-pay | Admitting: Orthopaedic Surgery

## 2019-08-23 ENCOUNTER — Ambulatory Visit (INDEPENDENT_AMBULATORY_CARE_PROVIDER_SITE_OTHER): Payer: Medicaid Other | Admitting: Physician Assistant

## 2019-08-23 DIAGNOSIS — Z96651 Presence of right artificial knee joint: Secondary | ICD-10-CM

## 2019-08-23 MED ORDER — OXYCODONE-ACETAMINOPHEN 5-325 MG PO TABS: 1.0000 | ORAL_TABLET | Freq: Three times a day (TID) | ORAL | 0 refills | Status: DC | PRN

## 2019-08-23 NOTE — Progress Notes (Signed)
Post-Op Visit Note   Patient: [name] [name]           Date of Birth: [birth year]           MRN: [id number] Visit Date: 08/23/2019 PCP: [place]:  Chief Complaint:  Chief Complaint  Patient presents with  . Right Knee - Routine Post Op   Visit Diagnoses:  1. Hx of total knee replacement, right     Plan: Patient is a pleasant 39 year old female who comes in today 2 weeks out right total knee replacement 09/05/2019.  She has been doing well.  She has been getting home physical therapy and she is currently ambulating with a walker.  No fevers or chills.  Moderate pain which is relieved with narcotic pain medication.  Examination of the right knee reveals a well-healing surgical incision without evidence of infection or cellulitis.  Calf soft nontender.  She is neurovascularly intact distally.  At this point, 2 interrupted Monocryl sutures were removed and Steri-Strips applied.  We will start the patient in formal physical therapy and an internal referral has been made.  I have refilled her oxycodone.  She will follow-up with [place] in 4 weeks time for repeat evaluation and 2 view x-rays of the right knee.  Call with concerns or questions in meantime.  Follow-Up Instructions: Return in about 4 weeks (around 09/20/2019).   Orders:  Orders Placed This Encounter  Procedures  . Ambulatory referral to Physical Therapy   Meds ordered this encounter  Medications  . oxyCODONE-acetaminophen (PERCOCET) 5-325 MG tablet    Sig: Take 1-2 tablets by mouth 3 (three) times daily as needed for severe pain.    Dispense:  30 tablet    Refill:  0    Imaging: No new imaging  PMFS History: Patient Active Problem List   Diagnosis Date Noted  . Status post total right knee replacement 08/08/2019  . Pelvic pain in female 08/26/2017  . Pelvic peritoneal adhesions, female 08/26/2017  . Endometriosis of pelvis 08/26/2017  . Unilateral primary osteoarthritis, right  knee 06/09/2017  . Unilateral primary osteoarthritis, left knee 06/09/2017  . Family history of breast cancer in mother 12/06/2015  . Menorrhagia 05/31/2013  . Dysmenorrhea 05/31/2013  . Dyspareunia 05/31/2013  . Asthma 08/05/2011   Past Medical History:  Diagnosis Date  . Allergy   . Arthritis    right knee  . Asthma   . Constipation   . Depression   . GERD (gastroesophageal reflux disease)   . History of kidney stones   . Panic disorder   . Pneumonia   . Renal disorder   . Seasonal allergies   . SVD (spontaneous vaginal delivery)    x 3    Family History  Problem Relation Age of Onset  . Hypertension Mother   . Diabetes Mother   . Breast cancer Mother 63       IDC; s/p lumpectomy, radiation, and arimidex  . Prostate cancer Paternal Grandfather        dx. 50s-60s  . Other Sister        both full sisters also have dense breast tissue  . Breast cancer Maternal Grandmother 83       IDC  . Colon polyps Maternal Grandmother        approx 4-5 colon polyps  . Pancreatic cancer Other        maternal great aunt (MGF's sister) d. pancreatic cancer  . Cancer Other  maternal great aunt (MGF's sister) dx NOS cancer  . Cancer Other        maternal great grandmother (MGF's mother) d. NOS cancer in her 52s  . Cancer Cousin 25       paternal 1st cousin dx. w/ either throat or thyroid cancer; not a smoker  . Breast cancer Other        paternal great aunt (PGF's sister) dx. breast cancer, 38s  . Colon cancer Neg Hx   . Rectal cancer Neg Hx   . Stomach cancer Neg Hx   . Esophageal cancer Neg Hx     Past Surgical History:  Procedure Laterality Date  . ABDOMINAL HYSTERECTOMY    . adiana    . BILATERAL SALPINGECTOMY Bilateral 05/31/2013   Procedure: BILATERAL SALPINGECTOMY;  Surgeon: Cheri Fowler, MD;  Location: [facility];  Service: Gynecology;  Laterality: Bilateral;  . BREAST CYST ASPIRATION Right 2015  . COLONOSCOPY    . DILATION AND CURETTAGE OF UTERUS    . DNC    .  KIDNEY STONE SURGERY    . KNEE ARTHROSCOPY Right   . KNEE ARTHROSCOPY WITH DRILLING/MICROFRACTURE Right 07/26/2014   Procedure: RIGHT KNEE ARTHROSCOPY WITH CHONDROPLASTY, MICROFRACTURE;  Surgeon: Marianna Payment, MD;  Location: [facility];  Service: Orthopedics;  Laterality: Right;  . KNEE ARTHROSCOPY WITH MEDIAL MENISECTOMY Right 07/26/2014   Procedure: KNEE ARTHROSCOPY WITH MEDIAL MENISECTOMY;  Surgeon: Marianna Payment, MD;  Location: [facility];  Service: Orthopedics;  Laterality: Right;  . LAPAROSCOPIC ASSISTED VAGINAL HYSTERECTOMY N/A 05/31/2013   Procedure: LAPAROSCOPIC ASSISTED VAGINAL HYSTERECTOMY;  Surgeon: Cheri Fowler, MD;  Location: [facility];  Service: Gynecology;  Laterality: N/A;  . LAPAROSCOPY N/A 08/26/2017   Procedure: LAPAROSCOPY OPERATIVE WITH PERIOTONEAL BIOPSY AND FULGERATION OF ENDOMETRIOSIS;  Surgeon: Cheri Fowler, MD;  Location: [facility];  Service: Gynecology;  Laterality: N/A;  . LUMBAR LAMINECTOMY/DECOMPRESSION MICRODISCECTOMY Right 12/31/2018   Procedure: MICRODISCECTOMY LUMBAR 5- SACRAL1;  Surgeon: Consuella Lose, MD;  Location: [facility];  Service: Neurosurgery;  Laterality: Right;  MICRODISCECTOMY LUMBAR 5- SACRAL1  . PARTIAL HYSTERECTOMY    . TONSILLECTOMY    . TONSILLECTOMY AND ADENOIDECTOMY    . TOTAL KNEE ARTHROPLASTY Right 08/08/2019  . TOTAL KNEE ARTHROPLASTY Right 08/08/2019   Procedure: RIGHT TOTAL KNEE ARTHROPLASTY;  Surgeon: Leandrew Koyanagi, MD;  Location: [facility];  Service: Orthopedics;  Laterality: Right;  . TUBAL LIGATION    . UPPER GI ENDOSCOPY     ulcer   Social History   Occupational History  . Not on file  Tobacco Use  . Smoking status: Never Smoker  . Smokeless tobacco: Never Used  Substance and Sexual Activity  . Alcohol use: No    Alcohol/week: 0.0 standard drinks  . Drug use: No  . Sexual activity: Not on file

## 2019-08-26 ENCOUNTER — Encounter: Payer: Self-pay | Admitting: Physical Therapy

## 2019-08-26 ENCOUNTER — Other Ambulatory Visit: Payer: Self-pay

## 2019-08-26 ENCOUNTER — Ambulatory Visit: Payer: Medicaid Other | Attending: Physician Assistant | Admitting: Physical Therapy

## 2019-08-26 DIAGNOSIS — M6281 Muscle weakness (generalized): Secondary | ICD-10-CM | POA: Diagnosis present

## 2019-08-26 DIAGNOSIS — M545 Low back pain: Secondary | ICD-10-CM | POA: Diagnosis present

## 2019-08-26 DIAGNOSIS — G8929 Other chronic pain: Secondary | ICD-10-CM | POA: Insufficient documentation

## 2019-08-26 DIAGNOSIS — M25561 Pain in right knee: Secondary | ICD-10-CM | POA: Diagnosis not present

## 2019-08-26 NOTE — Therapy (Signed)
[facility] [address] [place], [place], [address] Phone: [phone]   Fax:  [phone]  Physical Therapy Evaluation  Patient Details  Name: [name] [name] MRN: [id number] Date of Birth: 12-22-1980 Referring Provider (PT): Aundra Dubin, [place]   Encounter Date: 08/26/2019  PT End of Session - 08/26/19 0831    Visit Number  1    Date for PT Re-Evaluation  10/21/19    Authorization Type  Medicaid - will submit for auth    PT Start Time  [id number]    PT Stop Time  0915    PT Time Calculation (min)  42 min    Activity Tolerance  Patient tolerated treatment well    Behavior During Therapy  [facility] for tasks assessed/performed       Past Medical History:  Diagnosis Date  . Allergy   . Arthritis    right knee  . Asthma   . Constipation   . Depression   . GERD (gastroesophageal reflux disease)   . History of kidney stones   . Panic disorder   . Pneumonia   . Renal disorder   . Seasonal allergies   . SVD (spontaneous vaginal delivery)    x 3    Past Surgical History:  Procedure Laterality Date  . ABDOMINAL HYSTERECTOMY    . adiana    . BILATERAL SALPINGECTOMY Bilateral 05/31/2013   Procedure: BILATERAL SALPINGECTOMY;  Surgeon: Cheri Fowler, MD;  Location: [facility];  Service: Gynecology;  Laterality: Bilateral;  . BREAST CYST ASPIRATION Right 2015  . COLONOSCOPY    . DILATION AND CURETTAGE OF UTERUS    . DNC    . KIDNEY STONE SURGERY    . KNEE ARTHROSCOPY Right   . KNEE ARTHROSCOPY WITH DRILLING/MICROFRACTURE Right 07/26/2014   Procedure: RIGHT KNEE ARTHROSCOPY WITH CHONDROPLASTY, MICROFRACTURE;  Surgeon: Marianna Payment, MD;  Location: [facility];  Service: Orthopedics;  Laterality: Right;  . KNEE ARTHROSCOPY WITH MEDIAL MENISECTOMY Right 07/26/2014   Procedure: KNEE ARTHROSCOPY WITH MEDIAL MENISECTOMY;  Surgeon: Marianna Payment, MD;  Location: [facility];  Service:  Orthopedics;  Laterality: Right;  . LAPAROSCOPIC ASSISTED VAGINAL HYSTERECTOMY N/A 05/31/2013   Procedure: LAPAROSCOPIC ASSISTED VAGINAL HYSTERECTOMY;  Surgeon: Cheri Fowler, MD;  Location: [facility];  Service: Gynecology;  Laterality: N/A;  . LAPAROSCOPY N/A 08/26/2017   Procedure: LAPAROSCOPY OPERATIVE WITH PERIOTONEAL BIOPSY AND FULGERATION OF ENDOMETRIOSIS;  Surgeon: Cheri Fowler, MD;  Location: [facility];  Service: Gynecology;  Laterality: N/A;  . LUMBAR LAMINECTOMY/DECOMPRESSION MICRODISCECTOMY Right 12/31/2018   Procedure: MICRODISCECTOMY LUMBAR 5- SACRAL1;  Surgeon: Consuella Lose, MD;  Location: [facility];  Service: Neurosurgery;  Laterality: Right;  MICRODISCECTOMY LUMBAR 5- SACRAL1  . PARTIAL HYSTERECTOMY    . TONSILLECTOMY    . TONSILLECTOMY AND ADENOIDECTOMY    . TOTAL KNEE ARTHROPLASTY Right 08/08/2019  . TOTAL KNEE ARTHROPLASTY Right 08/08/2019   Procedure: RIGHT TOTAL KNEE ARTHROPLASTY;  Surgeon: Leandrew Koyanagi, MD;  Location: [facility];  Service: Orthopedics;  Laterality: Right;  . TUBAL LIGATION    . UPPER GI ENDOSCOPY     ulcer    There were no vitals filed for this visit.   Subjective Assessment - 08/26/19 0832    Subjective  Pt is a 39yo female who is 19 days post-op for Rt TKR 08/08/19 by Dr. Erlinda Hong.  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  She has had home health  PT since surgery.  Pt is using 2-wheel walker full time since surgery and has knee ROM machine at home she is using.  Doing HEP from home health daily.  Pt has ice packs she is using several times a day and is elevating above heart.  Pt had lumbar flare up yesterday so was unable to do exercises and feels knee is more swollen today.  Pt taking pain meds when needed, which is daily.    Pertinent History  lumbar surgery    Limitations  Walking;Standing;Sitting    How long can you sit comfortably?  1 hour    How long can you stand comfortably?  10    How long can you walk  comfortably?  5 min    Patient Stated Goals  sleep through night (restless leg and throbbing Rt leg), drive, run errands, standing for cooking    Currently in Pain?  Yes    Pain Score  6     Pain Location  Knee    Pain Orientation  Right    Pain Descriptors / Indicators  Throbbing;Restless;Tightness    Pain Type  Surgical pain    Pain Radiating Towards  Rt calf    Pain Onset  1 to 4 weeks ago    Pain Frequency  Constant    Aggravating Factors   stand, walk, not move it    Pain Relieving Factors  ice, using ROM machine, pain meds, massage    Effect of Pain on Daily Activities  drive, sleep, cooking, running errands, clean         [facility] PT Assessment - 08/26/19 0001      Assessment   Medical Diagnosis  Z96.651 (ICD-10-CM) - Hx of total knee replacement, right    Referring Provider (PT)  Aundra Dubin, PA-C    Onset Date/Surgical Date  08/08/19    Hand Dominance  Right    Next MD Visit  4 weeks    Prior Therapy  home health      Precautions   Precautions  Knee;Back    Precaution Comments  Rt TKR 08/08/19, lumbar laminectomy 2020      Restrictions   Weight Bearing Restrictions  No    Other Position/Activity Restrictions  Pt using walker currently      Balance Screen   Has the patient fallen in the past 6 months  No      [place] residence    Living Arrangements  Children;Spouse/significant other    Type of [place] to enter    Entrance Stairs-Number of Steps  3rd floor apartment, goes up/down sideways    Home Layout  One level    [place] - 2 wheels    Additional Comments  using ice packs and knee ROM machine      Prior Function   Level of Independence  Independent with household mobility with device    Vocation  Unemployed    Leisure  read      Cognition   Overall Cognitive Status  Within Functional Limits for tasks assessed      Observation/Other Assessments   Observations  steri  strips present on distal scar, good healing, no warmth or redness present, no pitting edema present along tibia    Skin Integrity  limited scar mobility throughout      Observation/Other Assessments-Edema    Edema  Circumferential  Circumferential Edema   Circumferential - Right  mid-patella 37cm, 10 cm below 31cm, 10 cm above 38.5    Circumferential - Left   mid-patella 34.5cm, 10 cm below 31cm, 10cm above 39cm      Functional Tests   Functional tests  Sit to Stand      Sit to Stand   Comments  uses bil UEs and Rt foot forward due to limited knee flexion      ROM / Strength   AROM / PROM / Strength  AROM;Strength      AROM   AROM Assessment Site  Knee    Right/Left Knee  Right;Left    Right Knee Extension  -18    Right Knee Flexion  70    Left Knee Extension  0    Left Knee Flexion  140      Strength   Overall Strength Comments  Lt LE 5/5 throughout, Rt LE 5/5 with exception of Rt knee    Strength Assessment Site  Knee    Right/Left Knee  Right    Right Knee Flexion  4/5    Right Knee Extension  4-/5      Flexibility   Soft Tissue Assessment /Muscle Length  yes    Hamstrings  limited 30% on Rt      Palpation   Patella mobility  Rt: good M/L, fair/poor S/I       Ambulation/Gait   Ambulation/Gait  Yes    Ambulation/Gait Assistance  6: Modified independent (Device/Increase time)    Assistive device  Rolling walker    Gait Pattern  Step-through pattern;Right flexed knee in stance    Ambulation Surface  Level    Stairs  Yes    Stairs Assistance  6: Modified independent (Device/Increase time)    Stair Management Technique  Sideways;Step to pattern   single rail               Objective measurements completed on examination: See above findings.              PT Education - 08/26/19 0920    Education Details  Access Code: [email or web address], Scar mobilization    Person(s) Educated  Patient    Methods  Explanation;Demonstration;Verbal cues;Handout     Comprehension  Verbalized understanding;Returned demonstration       PT Short Term Goals - 08/26/19 1218      PT SHORT TERM GOAL #1   Title  Pt will report improved pain by at least 20%    Time  4    Period  Weeks    Status  New    Target Date  09/23/19      PT SHORT TERM GOAL #2   Title  Pt will achieve improved Rt knee edema to </= 1.5cm difference at mid-patella compared to Lt knee.    Baseline  2.5cm difference at eval    Time  4    Period  Weeks    Status  New    Target Date  09/23/19      PT SHORT TERM GOAL #3   Title  Pt will be ind in initial HEP including scar massage and edema management and report daily compliance.    Time  2    Period  Weeks    Status  New    Target Date  09/23/19      PT SHORT TERM GOAL #4   Title  Pt will achieve knee flexion to 95 deg or more for  improved sit to stand and stand to sit transfers.    Baseline  70 deg    Time  4    Period  Weeks    Status  New    Target Date  09/23/19        PT Long Term Goals - 08/26/19 1206      PT LONG TERM GOAL #1   Title  Pt will be ind with advanced HEP and understand how to safely progress.    Baseline  no knowledge    Time  8    Period  Weeks    Status  New    Target Date  10/21/19      PT LONG TERM GOAL #2   Title  Pt will achieve Rt knee flexion of at least 125 deg and extension to 0 degrees for functional tasks such as gait, alternating LE stair pattern, and sit to stand transfers.    Baseline  Rt knee flexion 70 degrees, Rt knee extension -18 degrees, goes up and down stairs sideways    Time  8    Period  Weeks    Status  New    Target Date  10/21/19      PT LONG TERM GOAL #3   Title  Pt will be able to tolerate standing tasks >/= 45 min to enable household tasks such as cooking and cleaning.    Baseline  standing limited to 10 min due to pain    Time  8    Period  Weeks    Status  New    Target Date  10/21/19      PT LONG TERM GOAL #4   Title  Pt will be able to demo  symmetrical gait pattern without use of AD for unlimited return to running errands and daily tasks independently with optimal Rt LE function.    Baseline  uses 2-wheel walker full time since surgery, can walk x 5' only due to pain    Time  8    Period  Weeks    Status  New    Target Date  10/21/19      PT LONG TERM GOAL #5   Title  Pt will achieve 5/5 strength surrounding Rt knee to improve daily task performance including ascending/descending 3 flights of stairs to get to apartment.    Baseline  3+/5 Rt knee extension with VMO atrophy, 4-/5 Rt knee flexion    Time  8    Period  Weeks    Status  New    Target Date  10/21/19             Plan - 08/26/19 1220    Clinical Impression Statement  Pt is 2 weeks s/p Rt TKR with good signs of healing.  She had home health PT and is compliant with edema management and ROM progression learned thus far.  She has limited Rt knee ROM -18 deg extension and 70 deg flexion.  Quad strength is 3+/5 with VMO atrophy and lack of recruitment.  Hamstring strength is 4-/5.  Strength of remaining muscle groups is 5/5 for bil LEs.  Edema differential is 2.5cm mid-patella Rt compared to Lt.  There is no edema or swelling in calf at this time.  She is currently ambulating with walker and ascends/descends 3 flights of stairs to/from apartment using sideways strategy.  Knee remains flexed during gait.  PT emphasized need for weaning AD over time, use of heel toe gait pattern, continued  ROM and edema management, and gave HEP, some of which overlapped with home health ther ex given.  Pt will benefit from skilled PT for ROM, strength, manual techniques, modalities, gait and stair training, and neuro re-ed for post-op care and return to independence with daily activities.    Personal Factors and Comorbidities  Comorbidity 1    Comorbidities  Hx of lumbar lami summer 2020, has some ongoing lumbar pain, had Lt LE weakness prior to lumbar surgery    Examination-Activity  Limitations  Locomotion Level;Transfers;Bed Mobility;Sit;Sleep;Squat;Stairs;Stand;Lift    Examination-Participation Restrictions  Community Activity;Driving;Cleaning;Meal Prep;Laundry    Stability/Clinical Decision Making  Stable/Uncomplicated    Clinical Decision Making  Low    Rehab Potential  Excellent    PT Frequency  2x / week    PT Duration  8 weeks    PT Treatment/Interventions  ADLs/Self Care Home Management;Aquatic Therapy;Electrical Stimulation;Cryotherapy;Moist Heat;Neuromuscular re-education;Balance training;Therapeutic exercise;Therapeutic activities;Functional mobility training;Stair training;Gait training;Patient/family education;Manual techniques;Scar mobilization;Passive range of motion;Dry needling;Taping;Joint Manipulations    PT Next Visit Plan  Rt knee ROM, retrograde massage, scar mobs, rock and roll on bike, progress HEP if needed, trial NMES for improved Rt VMO quad set recruitment, Game Ready    PT Home Exercise Plan  Access Code: [email or web address], scar mobs    Consulted and Agree with Plan of Care  Patient       Patient will benefit from skilled therapeutic intervention in order to improve the following deficits and impairments:  Abnormal gait, Decreased range of motion, Difficulty walking, Decreased activity tolerance, Pain, Decreased balance, Decreased scar mobility, Hypomobility, Impaired flexibility, Improper body mechanics, Decreased mobility, Decreased strength, Increased edema, Postural dysfunction  Visit Diagnosis: Acute pain of right knee  Muscle weakness (generalized)     Problem List Patient Active Problem List   Diagnosis Date Noted  . Status post total right knee replacement 08/08/2019  . Pelvic pain in female 08/26/2017  . Pelvic peritoneal adhesions, female 08/26/2017  . Endometriosis of pelvis 08/26/2017  . Unilateral primary osteoarthritis, right knee 06/09/2017  . Unilateral primary osteoarthritis, left knee 06/09/2017  . Family history of breast  cancer in mother 12/06/2015  . Menorrhagia 05/31/2013  . Dysmenorrhea 05/31/2013  . Dyspareunia 05/31/2013  . Asthma 08/05/2011    Baruch Merl, PT 08/26/19 12:35 PM   [facility] [address], [place], [place], [address] Phone: [phone]   Fax:  [phone]  Name: [name] [name] MRN: [id number] Date of Birth: 04/09/1981

## 2019-08-26 NOTE — Patient Instructions (Signed)
Access Code: [email or web address]  URL: [email or web address]  Date: 08/26/2019  Prepared by: Baruch Merl   Exercises  Long Sitting Quad Set with Towel Roll Under Heel - 10 reps - 3 sets - 5 hold - 1x daily - 7x weekly  Supine Knee Flexion Self Mobilization - 10 reps - 3 sets - 1x daily - 7x weekly  Seated Long Arc Quad with Hip Adduction - 10 reps - 3 sets - 1x daily - 7x weekly  Hooklying Isometric Hip Flexion - 10 reps - 3 sets - 5 hold - 1x daily - 7x weekly  Seated Hamstring Stretch - 3 reps - 3 sets - 30 hold - 1x daily - 7x weekly  Heel Toe Raises with Counter Support - 10 reps - 3 sets - 1x daily - 7x weekly   URL: [email or web address]  Date: 08/26/2019  Prepared by: Baruch Merl   Exercises  Long Sitting Quad Set with Towel Roll Under Heel - 10 reps - 3 sets - 5 hold - 1x daily - 7x weekly  Supine Knee Flexion Self Mobilization - 10 reps - 3 sets - 1x daily - 7x weekly  Seated Long Arc Quad with Hip Adduction - 10 reps - 3 sets - 1x daily - 7x weekly  Hooklying Isometric Hip Flexion - 10 reps - 3 sets - 5 hold - 1x daily - 7x weekly  Seated Hamstring Stretch - 3 reps - 3 sets - 30 hold - 1x daily - 7x weekly  Heel Toe Raises with Counter Support - 10 reps - 3 sets - 1x daily - 7x weekly

## 2019-09-02 ENCOUNTER — Other Ambulatory Visit: Payer: Self-pay

## 2019-09-02 ENCOUNTER — Encounter: Payer: Self-pay | Admitting: Physical Therapy

## 2019-09-02 ENCOUNTER — Ambulatory Visit: Payer: Medicaid Other | Admitting: Physical Therapy

## 2019-09-02 DIAGNOSIS — M25561 Pain in right knee: Secondary | ICD-10-CM | POA: Diagnosis not present

## 2019-09-02 DIAGNOSIS — G8929 Other chronic pain: Secondary | ICD-10-CM

## 2019-09-02 DIAGNOSIS — M6281 Muscle weakness (generalized): Secondary | ICD-10-CM

## 2019-09-02 DIAGNOSIS — M545 Low back pain, unspecified: Secondary | ICD-10-CM

## 2019-09-02 NOTE — Therapy (Signed)
[facility] [address] [place], [place], [address] Phone: [phone]   Fax:  [phone]  Physical Therapy Treatment  Patient Details  Name: [name] [name] MRN: [id number] Date of Birth: 1981-07-05 Referring Provider (PT): Aundra Dubin, [place]   Encounter Date: 09/02/2019  PT End of Session - 09/02/19 0803    Visit Number  2    Date for PT Re-Evaluation  10/21/19    Authorization Type  Medicaid - will submit for auth    Authorization - Visit Number  1    Authorization - Number of Visits  4    PT Start Time  0803    PT Stop Time  0913    PT Time Calculation (min)  70 min    Activity Tolerance  Patient tolerated treatment well    Behavior During Therapy  [facility] for tasks assessed/performed       Past Medical History:  Diagnosis Date  . Allergy   . Arthritis    right knee  . Asthma   . Constipation   . Depression   . GERD (gastroesophageal reflux disease)   . History of kidney stones   . Panic disorder   . Pneumonia   . Renal disorder   . Seasonal allergies   . SVD (spontaneous vaginal delivery)    x 3    Past Surgical History:  Procedure Laterality Date  . ABDOMINAL HYSTERECTOMY    . adiana    . BILATERAL SALPINGECTOMY Bilateral 05/31/2013   Procedure: BILATERAL SALPINGECTOMY;  Surgeon: Cheri Fowler, MD;  Location: [facility];  Service: Gynecology;  Laterality: Bilateral;  . BREAST CYST ASPIRATION Right 2015  . COLONOSCOPY    . DILATION AND CURETTAGE OF UTERUS    . DNC    . KIDNEY STONE SURGERY    . KNEE ARTHROSCOPY Right   . KNEE ARTHROSCOPY WITH DRILLING/MICROFRACTURE Right 07/26/2014   Procedure: RIGHT KNEE ARTHROSCOPY WITH CHONDROPLASTY, MICROFRACTURE;  Surgeon: Marianna Payment, MD;  Location: [place];  Service: Orthopedics;  Laterality: Right;  . KNEE ARTHROSCOPY WITH MEDIAL MENISECTOMY Right 07/26/2014   Procedure: KNEE ARTHROSCOPY WITH MEDIAL MENISECTOMY;  Surgeon:  Marianna Payment, MD;  Location: [place];  Service: Orthopedics;  Laterality: Right;  . LAPAROSCOPIC ASSISTED VAGINAL HYSTERECTOMY N/A 05/31/2013   Procedure: LAPAROSCOPIC ASSISTED VAGINAL HYSTERECTOMY;  Surgeon: Cheri Fowler, MD;  Location: [facility];  Service: Gynecology;  Laterality: N/A;  . LAPAROSCOPY N/A 08/26/2017   Procedure: LAPAROSCOPY OPERATIVE WITH PERIOTONEAL BIOPSY AND FULGERATION OF ENDOMETRIOSIS;  Surgeon: Cheri Fowler, MD;  Location: [facility];  Service: Gynecology;  Laterality: N/A;  . LUMBAR LAMINECTOMY/DECOMPRESSION MICRODISCECTOMY Right 12/31/2018   Procedure: MICRODISCECTOMY LUMBAR 5- SACRAL1;  Surgeon: Consuella Lose, MD;  Location: [place];  Service: Neurosurgery;  Laterality: Right;  MICRODISCECTOMY LUMBAR 5- SACRAL1  . PARTIAL HYSTERECTOMY    . TONSILLECTOMY    . TONSILLECTOMY AND ADENOIDECTOMY    . TOTAL KNEE ARTHROPLASTY Right 08/08/2019  . TOTAL KNEE ARTHROPLASTY Right 08/08/2019   Procedure: RIGHT TOTAL KNEE ARTHROPLASTY;  Surgeon: Leandrew Koyanagi, MD;  Location: [place];  Service: Orthopedics;  Laterality: Right;  . TUBAL LIGATION    . UPPER GI ENDOSCOPY     ulcer    There were no vitals filed for this visit.  Subjective Assessment - 09/02/19 0805    Subjective  Stiff this AM    Pertinent History  lumbar surgery    Currently in Pain?  Yes  Pain Score  3     Pain Location  Knee    Pain Orientation  Right    Pain Descriptors / Indicators  Tightness;Sore    Aggravating Factors   Bending    Pain Relieving Factors  ice, meds         [facility] PT Assessment - 09/02/19 0001      AROM   Right Knee Extension  -10   Passive 5   Right Knee Flexion  94                   [facility] Adult PT Treatment/Exercise - 09/02/19 0001      Knee/Hip Exercises: Stretches   Knee: Self-Stretch to increase Flexion  --   20x with towel for assit     Knee/Hip Exercises: Aerobic   Nustep  L1 x 6 min with PTA present      Knee/Hip Exercises: Supine    Quad Sets  --   10x   Short Arc Quad Sets  --   2x5     Theme park manager  Rt quad    Science writer Stimulation Parameters  40 pps, 2 sec ramp, 10/30 x 15 min with concurrent SAQ    Electrical Stimulation Goals  Neuromuscular facilitation      Manual Therapy   Soft tissue mobilization  RT quad, gastroc, hamstrings, scar mobs,     Passive ROM  Knee extension               PT Short Term Goals - 08/26/19 1218      PT SHORT TERM GOAL #1   Title  Pt will report improved pain by at least 20%    Time  4    Period  Weeks    Status  New    Target Date  09/23/19      PT SHORT TERM GOAL #2   Title  Pt will achieve improved Rt knee edema to </= 1.5cm difference at mid-patella compared to Lt knee.    Baseline  2.5cm difference at eval    Time  4    Period  Weeks    Status  New    Target Date  09/23/19      PT SHORT TERM GOAL #3   Title  Pt will be ind in initial HEP including scar massage and edema management and report daily compliance.    Time  2    Period  Weeks    Status  New    Target Date  09/23/19      PT SHORT TERM GOAL #4   Title  Pt will achieve knee flexion to 95 deg or more for improved sit to stand and stand to sit transfers.    Baseline  70 deg    Time  4    Period  Weeks    Status  New    Target Date  09/23/19        PT Long Term Goals - 08/26/19 1206      PT LONG TERM GOAL #1   Title  Pt will be ind with advanced HEP and understand how to safely progress.    Baseline  no knowledge    Time  8    Period  Weeks    Status  New    Target Date  10/21/19      PT LONG TERM GOAL #2   Title  Pt will achieve Rt knee flexion of at least 125 deg and extension to 0 degrees for functional tasks such as gait, alternating LE stair pattern, and sit to stand transfers.    Baseline  Rt knee flexion 70 degrees, Rt knee extension -18 degrees, goes up and down stairs sideways    Time   8    Period  Weeks    Status  New    Target Date  10/21/19      PT LONG TERM GOAL #3   Title  Pt will be able to tolerate standing tasks >/= 45 min to enable household tasks such as cooking and cleaning.    Baseline  standing limited to 10 min due to pain    Time  8    Period  Weeks    Status  New    Target Date  10/21/19      PT LONG TERM GOAL #4   Title  Pt will be able to demo symmetrical gait pattern without use of AD for unlimited return to running errands and daily tasks independently with optimal Rt LE function.    Baseline  uses 2-wheel walker full time since surgery, can walk x 5' only due to pain    Time  8    Period  Weeks    Status  New    Target Date  10/21/19      PT LONG TERM GOAL #5   Title  Pt will achieve 5/5 strength surrounding Rt knee to improve daily task performance including ascending/descending 3 flights of stairs to get to apartment.    Baseline  3+/5 Rt knee extension with VMO atrophy, 4-/5 Rt knee flexion    Time  8    Period  Weeks    Status  New    Target Date  10/21/19            Plan - 09/02/19 0803    Clinical Impression Statement  Pt presents today with knee stiffness and a little soreness. She continues to ambulate with RW as she reports her knee intermittently"buckles." Quad contraction remains poor so we used [place] Estim to facilitate better contraction when perfroming an Nurse, children's. AROM post session 10-94 degrees in supine.    Personal Factors and Comorbidities  Comorbidity 1    Comorbidities  Hx of lumbar lami summer 2020, has some ongoing lumbar pain, had Lt LE weakness prior to lumbar surgery    Examination-Activity Limitations  Locomotion Level;Transfers;Bed Mobility;Sit;Sleep;Squat;Stairs;Stand;Lift    Examination-Participation Restrictions  Community Activity;Driving;Cleaning;Meal Prep;Laundry    Stability/Clinical Decision Making  Stable/Uncomplicated    Rehab Potential  Excellent    PT Frequency  2x / week    PT Duration  8 weeks     PT Treatment/Interventions  ADLs/Self Care Home Management;Aquatic Therapy;Electrical Stimulation;Cryotherapy;Moist Heat;Neuromuscular re-education;Balance training;Therapeutic exercise;Therapeutic activities;Functional mobility training;Stair training;Gait training;Patient/family education;Manual techniques;Scar mobilization;Passive range of motion;Dry needling;Taping;Joint Manipulations    PT Next Visit Plan  Continue with ROM, quad facilitation, weight shifting on the mini tramp, begin to wean off the walker. Game ready, Nustep    PT Home Exercise Plan  Access Code: [email or web address], scar [email or web address]    Consulted and Agree with Plan of Care  Patient       Patient will benefit from skilled therapeutic intervention in order to improve the following deficits and impairments:  Abnormal gait, Decreased range of motion, Difficulty walking, Decreased activity tolerance, Pain, Decreased balance, Decreased scar mobility, Hypomobility, Impaired flexibility, Improper body mechanics, Decreased mobility, Decreased strength,  Increased edema, Postural dysfunction  Visit Diagnosis: Acute pain of right knee  Muscle weakness (generalized)  Chronic bilateral low back pain, unspecified whether sciatica present     Problem List Patient Active Problem List   Diagnosis Date Noted  . Status post total right knee replacement 08/08/2019  . Pelvic pain in female 08/26/2017  . Pelvic peritoneal adhesions, female 08/26/2017  . Endometriosis of pelvis 08/26/2017  . Unilateral primary osteoarthritis, right knee 06/09/2017  . Unilateral primary osteoarthritis, left knee 06/09/2017  . Family history of breast cancer in mother 12/06/2015  . Menorrhagia 05/31/2013  . Dysmenorrhea 05/31/2013  . Dyspareunia 05/31/2013  . Asthma 08/05/2011    Canyon Willow, PTA 09/02/2019, 11:24 AM  [facility] [address] [place], [place], [address] Phone: [phone]    Fax:  [phone]  Name: [name] [name] MRN: [id number] Date of Birth: 1981/05/12

## 2019-09-05 ENCOUNTER — Encounter: Payer: Self-pay | Admitting: Physical Therapy

## 2019-09-05 ENCOUNTER — Other Ambulatory Visit: Payer: Self-pay

## 2019-09-05 ENCOUNTER — Ambulatory Visit: Payer: Medicaid Other | Attending: Physician Assistant | Admitting: Physical Therapy

## 2019-09-05 DIAGNOSIS — M545 Low back pain, unspecified: Secondary | ICD-10-CM

## 2019-09-05 DIAGNOSIS — G8929 Other chronic pain: Secondary | ICD-10-CM

## 2019-09-05 DIAGNOSIS — M25561 Pain in right knee: Secondary | ICD-10-CM | POA: Diagnosis not present

## 2019-09-05 DIAGNOSIS — M6281 Muscle weakness (generalized): Secondary | ICD-10-CM | POA: Diagnosis present

## 2019-09-05 NOTE — Therapy (Signed)
[facility] [address] [place], [place], [address] Phone: [phone]   Fax:  [phone]  Physical Therapy Treatment  Patient Details  Name: [name] [name] MRN: [id number] Date of Birth: [birth year] Referring Provider (PT): Aundra Dubin, [place]   Encounter Date: 09/05/2019  PT End of Session - 09/05/19 1240    Visit Number  3    Date for PT Re-Evaluation  10/21/19    Authorization Type  Medicaid - will submit for auth    Authorization - Visit Number  2    Authorization - Number of Visits  4    PT Start Time  [id number]   pt late   PT Stop Time  1318    PT Time Calculation (min)  40 min    Activity Tolerance  Patient tolerated treatment well    Behavior During Therapy  [facility] for tasks assessed/performed       Past Medical History:  Diagnosis Date  . Allergy   . Arthritis    right knee  . Asthma   . Constipation   . Depression   . GERD (gastroesophageal reflux disease)   . History of kidney stones   . Panic disorder   . Pneumonia   . Renal disorder   . Seasonal allergies   . SVD (spontaneous vaginal delivery)    x 3    Past Surgical History:  Procedure Laterality Date  . ABDOMINAL HYSTERECTOMY    . adiana    . BILATERAL SALPINGECTOMY Bilateral 05/31/2013   Procedure: BILATERAL SALPINGECTOMY;  Surgeon: Cheri Fowler, MD;  Location: [facility];  Service: Gynecology;  Laterality: Bilateral;  . BREAST CYST ASPIRATION Right 2015  . COLONOSCOPY    . DILATION AND CURETTAGE OF UTERUS    . DNC    . KIDNEY STONE SURGERY    . KNEE ARTHROSCOPY Right   . KNEE ARTHROSCOPY WITH DRILLING/MICROFRACTURE Right 07/26/2014   Procedure: RIGHT KNEE ARTHROSCOPY WITH CHONDROPLASTY, MICROFRACTURE;  Surgeon: Marianna Payment, MD;  Location: [place];  Service: Orthopedics;  Laterality: Right;  . KNEE ARTHROSCOPY WITH MEDIAL MENISECTOMY Right 07/26/2014   Procedure: KNEE ARTHROSCOPY WITH MEDIAL MENISECTOMY;   Surgeon: Marianna Payment, MD;  Location: [place];  Service: Orthopedics;  Laterality: Right;  . LAPAROSCOPIC ASSISTED VAGINAL HYSTERECTOMY N/A 05/31/2013   Procedure: LAPAROSCOPIC ASSISTED VAGINAL HYSTERECTOMY;  Surgeon: Cheri Fowler, MD;  Location: [facility];  Service: Gynecology;  Laterality: N/A;  . LAPAROSCOPY N/A 08/26/2017   Procedure: LAPAROSCOPY OPERATIVE WITH PERIOTONEAL BIOPSY AND FULGERATION OF ENDOMETRIOSIS;  Surgeon: Cheri Fowler, MD;  Location: [facility];  Service: Gynecology;  Laterality: N/A;  . LUMBAR LAMINECTOMY/DECOMPRESSION MICRODISCECTOMY Right 12/31/2018   Procedure: MICRODISCECTOMY LUMBAR 5- SACRAL1;  Surgeon: Consuella Lose, MD;  Location: [place];  Service: Neurosurgery;  Laterality: Right;  MICRODISCECTOMY LUMBAR 5- SACRAL1  . PARTIAL HYSTERECTOMY    . TONSILLECTOMY    . TONSILLECTOMY AND ADENOIDECTOMY    . TOTAL KNEE ARTHROPLASTY Right 08/08/2019  . TOTAL KNEE ARTHROPLASTY Right 08/08/2019   Procedure: RIGHT TOTAL KNEE ARTHROPLASTY;  Surgeon: Leandrew Koyanagi, MD;  Location: [place];  Service: Orthopedics;  Laterality: Right;  . TUBAL LIGATION    . UPPER GI ENDOSCOPY     ulcer    There were no vitals filed for this visit.  Subjective Assessment - 09/05/19 1242    Subjective  I felt really good after last session.  My back bothers me more than my knee.  Pertinent History  lumbar surgery    Currently in Pain?  No/denies   Just feels tight   Multiple Pain Sites  No                       [facility] Adult PT Treatment/Exercise - 09/05/19 0001      Ambulation/Gait   Ambulation/Gait  Yes    Ambulation/Gait Assistance  6: Modified independent (Device/Increase time)    Assistive device  Straight cane    Gait Pattern  Step-through pattern;Decreased step length - right    Ambulation Surface  Level      Knee/Hip Exercises: Stretches   Knee: Self-Stretch to increase Flexion  --   seated knee flexion AROM with floor slider     Knee/Hip  Exercises: Aerobic   Stationary Bike  AROM only to get feedback on her hip hiking: L0 5 min    Nustep  L1 x 5 min at end of sessio      Knee/Hip Exercises: Seated   Long Arc Quad  AROM;Strengthening;Right;2 sets    Long Arc Quad Limitations  Gave for HEP      Vasopneumatic   Number Minutes Vasopneumatic   5 minutes    Vasopnuematic Location   Knee    Vasopneumatic Pressure  Medium    Vasopneumatic Temperature   3 flakes   This was also done on 09/02/19     Manual Therapy   Passive ROM  Knee flex/ext in sitting             PT Education - 09/05/19 1255    Education Details  LAQ    Person(s) Educated  Patient    Methods  Explanation;Demonstration;Tactile cues;Verbal cues;Handout    Comprehension  Returned demonstration;Verbalized understanding       PT Short Term Goals - 08/26/19 1218      PT SHORT TERM GOAL #1   Title  Pt will report improved pain by at least 20%    Time  4    Period  Weeks    Status  New    Target Date  09/23/19      PT SHORT TERM GOAL #2   Title  Pt will achieve improved Rt knee edema to </= 1.5cm difference at mid-patella compared to Lt knee.    Baseline  2.5cm difference at eval    Time  4    Period  Weeks    Status  New    Target Date  09/23/19      PT SHORT TERM GOAL #3   Title  Pt will be ind in initial HEP including scar massage and edema management and report daily compliance.    Time  2    Period  Weeks    Status  New    Target Date  09/23/19      PT SHORT TERM GOAL #4   Title  Pt will achieve knee flexion to 95 deg or more for improved sit to stand and stand to sit transfers.    Baseline  70 deg    Time  4    Period  Weeks    Status  New    Target Date  09/23/19        PT Long Term Goals - 08/26/19 1206      PT LONG TERM GOAL #1   Title  Pt will be ind with advanced HEP and understand how to safely progress.    Baseline  no knowledge  Time  8    Period  Weeks    Status  New    Target Date  10/21/19      PT LONG  TERM GOAL #2   Title  Pt will achieve Rt knee flexion of at least 125 deg and extension to 0 degrees for functional tasks such as gait, alternating LE stair pattern, and sit to stand transfers.    Baseline  Rt knee flexion 70 degrees, Rt knee extension -18 degrees, goes up and down stairs sideways    Time  8    Period  Weeks    Status  New    Target Date  10/21/19      PT LONG TERM GOAL #3   Title  Pt will be able to tolerate standing tasks >/= 45 min to enable household tasks such as cooking and cleaning.    Baseline  standing limited to 10 min due to pain    Time  8    Period  Weeks    Status  New    Target Date  10/21/19      PT LONG TERM GOAL #4   Title  Pt will be able to demo symmetrical gait pattern without use of AD for unlimited return to running errands and daily tasks independently with optimal Rt LE function.    Baseline  uses 2-wheel walker full time since surgery, can walk x 5' only due to pain    Time  8    Period  Weeks    Status  New    Target Date  10/21/19      PT LONG TERM GOAL #5   Title  Pt will achieve 5/5 strength surrounding Rt knee to improve daily task performance including ascending/descending 3 flights of stairs to get to apartment.    Baseline  3+/5 Rt knee extension with VMO atrophy, 4-/5 Rt knee flexion    Time  8    Period  Weeks    Status  New    Target Date  10/21/19            Plan - 09/05/19 1240    Clinical Impression Statement  Pt 10 min late for for session so treatment options were limited in time. Pt demonstrated good use of cane and will look into purchasing one for herself at home. Pt was able to demonstrate a slow, full forward revolution on the bike. At first she had significant hip hiking but this improved she she pedaled.    Personal Factors and Comorbidities  Comorbidity 1    Comorbidities  Hx of lumbar lami summer 2020, has some ongoing lumbar pain, had Lt LE weakness prior to lumbar surgery    Examination-Activity  Limitations  Locomotion Level;Transfers;Bed Mobility;Sit;Sleep;Squat;Stairs;Stand;Lift    Examination-Participation Restrictions  Community Activity;Driving;Cleaning;Meal Prep;Laundry    Stability/Clinical Decision Making  Stable/Uncomplicated    Rehab Potential  Excellent    PT Frequency  2x / week    PT Duration  8 weeks    PT Treatment/Interventions  ADLs/Self Care Home Management;Aquatic Therapy;Electrical Stimulation;Cryotherapy;Moist Heat;Neuromuscular re-education;Balance training;Therapeutic exercise;Therapeutic activities;Functional mobility training;Stair training;Gait training;Patient/family education;Manual techniques;Scar mobilization;Passive range of motion;Dry needling;Taping;Joint Manipulations    PT Next Visit Plan  Continue with ROM, quad facilitation, weight shifting on the mini tramp, begin to wean off the walker. Game ready, Nustep    PT Home Exercise Plan  Access Code: [email or web address], scar [email or web address]    Consulted and Agree with Plan of Care  Patient  Patient will benefit from skilled therapeutic intervention in order to improve the following deficits and impairments:  Abnormal gait, Decreased range of motion, Difficulty walking, Decreased activity tolerance, Pain, Decreased balance, Decreased scar mobility, Hypomobility, Impaired flexibility, Improper body mechanics, Decreased mobility, Decreased strength, Increased edema, Postural dysfunction  Visit Diagnosis: Acute pain of right knee  Muscle weakness (generalized)  Chronic bilateral low back pain, unspecified whether sciatica present     Problem List Patient Active Problem List   Diagnosis Date Noted  . Status post total right knee replacement 08/08/2019  . Pelvic pain in female 08/26/2017  . Pelvic peritoneal adhesions, female 08/26/2017  . Endometriosis of pelvis 08/26/2017  . Unilateral primary osteoarthritis, right knee 06/09/2017  . Unilateral primary osteoarthritis, left knee 06/09/2017  . Family history of  breast cancer in mother 12/06/2015  . Menorrhagia 05/31/2013  . Dysmenorrhea 05/31/2013  . Dyspareunia 05/31/2013  . Asthma 08/05/2011    [name] [name], PTA 09/05/2019, 1:55 PM   [facility] [address], [place], [place], [address] Phone: [phone]   Fax:  [phone]  Name: [name] [name] MRN: [id number] Date of Birth: 05-17-1981

## 2019-09-09 ENCOUNTER — Encounter: Payer: Self-pay | Admitting: Physical Therapy

## 2019-09-09 ENCOUNTER — Ambulatory Visit: Payer: Medicaid Other | Admitting: Physical Therapy

## 2019-09-09 ENCOUNTER — Other Ambulatory Visit: Payer: Self-pay

## 2019-09-09 DIAGNOSIS — M545 Low back pain, unspecified: Secondary | ICD-10-CM

## 2019-09-09 DIAGNOSIS — M25561 Pain in right knee: Secondary | ICD-10-CM | POA: Diagnosis not present

## 2019-09-09 DIAGNOSIS — M6281 Muscle weakness (generalized): Secondary | ICD-10-CM

## 2019-09-09 DIAGNOSIS — G8929 Other chronic pain: Secondary | ICD-10-CM

## 2019-09-09 NOTE — Therapy (Signed)
[facility] [address] [place], [place], [address] Phone: [phone]   Fax:  [phone]  Physical Therapy Treatment  Patient Details  Name: [name] [name] MRN: [id number] Date of Birth: Jun 06, 1981 Referring Provider (PT): Aundra Dubin, [place]   Encounter Date: 09/09/2019  PT End of Session - 09/09/19 0841    Visit Number  4    Date for PT Re-Evaluation  10/21/19    Authorization Type  Medicaid - will submit for auth    Authorization - Visit Number  3    Authorization - Number of Visits  4    PT Start Time  [id number]    PT Stop Time  [id number]    PT Time Calculation (min)  45 min    Activity Tolerance  Patient tolerated treatment well;No increased pain    Behavior During Therapy  WFL for tasks assessed/performed       Past Medical History:  Diagnosis Date  . Allergy   . Arthritis    right knee  . Asthma   . Constipation   . Depression   . GERD (gastroesophageal reflux disease)   . History of kidney stones   . Panic disorder   . Pneumonia   . Renal disorder   . Seasonal allergies   . SVD (spontaneous vaginal delivery)    x 3    Past Surgical History:  Procedure Laterality Date  . ABDOMINAL HYSTERECTOMY    . adiana    . BILATERAL SALPINGECTOMY Bilateral 05/31/2013   Procedure: BILATERAL SALPINGECTOMY;  Surgeon: Cheri Fowler, MD;  Location: [facility];  Service: Gynecology;  Laterality: Bilateral;  . BREAST CYST ASPIRATION Right 2015  . COLONOSCOPY    . DILATION AND CURETTAGE OF UTERUS    . DNC    . KIDNEY STONE SURGERY    . KNEE ARTHROSCOPY Right   . KNEE ARTHROSCOPY WITH DRILLING/MICROFRACTURE Right 07/26/2014   Procedure: RIGHT KNEE ARTHROSCOPY WITH CHONDROPLASTY, MICROFRACTURE;  Surgeon: Marianna Payment, MD;  Location: [facility];  Service: Orthopedics;  Laterality: Right;  . KNEE ARTHROSCOPY WITH MEDIAL MENISECTOMY Right 07/26/2014   Procedure: KNEE ARTHROSCOPY WITH MEDIAL  MENISECTOMY;  Surgeon: Marianna Payment, MD;  Location: [facility];  Service: Orthopedics;  Laterality: Right;  . LAPAROSCOPIC ASSISTED VAGINAL HYSTERECTOMY N/A 05/31/2013   Procedure: LAPAROSCOPIC ASSISTED VAGINAL HYSTERECTOMY;  Surgeon: Cheri Fowler, MD;  Location: [facility];  Service: Gynecology;  Laterality: N/A;  . LAPAROSCOPY N/A 08/26/2017   Procedure: LAPAROSCOPY OPERATIVE WITH PERIOTONEAL BIOPSY AND FULGERATION OF ENDOMETRIOSIS;  Surgeon: Cheri Fowler, MD;  Location: [facility];  Service: Gynecology;  Laterality: N/A;  . LUMBAR LAMINECTOMY/DECOMPRESSION MICRODISCECTOMY Right 12/31/2018   Procedure: MICRODISCECTOMY LUMBAR 5- SACRAL1;  Surgeon: Consuella Lose, MD;  Location: [facility];  Service: Neurosurgery;  Laterality: Right;  MICRODISCECTOMY LUMBAR 5- SACRAL1  . PARTIAL HYSTERECTOMY    . TONSILLECTOMY    . TONSILLECTOMY AND ADENOIDECTOMY    . TOTAL KNEE ARTHROPLASTY Right 08/08/2019  . TOTAL KNEE ARTHROPLASTY Right 08/08/2019   Procedure: RIGHT TOTAL KNEE ARTHROPLASTY;  Surgeon: Leandrew Koyanagi, MD;  Location: [facility];  Service: Orthopedics;  Laterality: Right;  . TUBAL LIGATION    . UPPER GI ENDOSCOPY     ulcer    There were no vitals filed for this visit.  Subjective Assessment - 09/09/19 0810    Subjective  Knee is stiff this morning.  I am mostly using the straight cane for short distances now.  I just got it on Wed this week.  Pain ranges from 3-5/10.    Pertinent History  lumbar surgery    Limitations  Walking;Standing;Sitting    How long can you sit comfortably?  1 hour    How long can you stand comfortably?  20    How long can you walk comfortably?  20 with cane    Patient Stated Goals  sleep through night (restless leg and throbbing Rt leg), drive, run errands, standing for cooking    Currently in Pain?  Yes    Pain Score  4     Pain Location  Knee    Pain Orientation  Right    Pain Descriptors / Indicators  Tightness;Sore    Pain Type  Surgical pain     Pain Onset  1 to 4 weeks ago    Pain Frequency  Intermittent    Aggravating Factors   not moving    Pain Relieving Factors  ice, meds    Effect of Pain on Daily Activities  drive, sleep, run errands > 20 min         [facility] PT Assessment - 09/09/19 0001      Assessment   Medical Diagnosis  Z96.651 (ICD-10-CM) - Hx of total knee replacement, right    Referring Provider (PT)  Aundra Dubin, PA-C    Onset Date/Surgical Date  08/08/19    Hand Dominance  Right    Next MD Visit  4 weeks    Prior Therapy  home health      Circumferential Edema   Circumferential - Right  10 cm above 40cm, mid-patella 37      AROM   Right Knee Extension  -10    Right Knee Flexion  95                   [facility] Adult PT Treatment/Exercise - 09/09/19 0001      Knee/Hip Exercises: Stretches   Active Hamstring Stretch  Right;3 reps;10 seconds    Active Hamstring Stretch Limitations  foot on second step    Gastroc Stretch  Right;3 reps;20 seconds    Gastroc Stretch Limitations  slant board    Other Knee/Hip Stretches  knee flexion/extension foot on 2nd step, seated Rt knee flexion with slider x 20 reps      Knee/Hip Exercises: Aerobic   Stationary Bike  5' no resistance for ROM      Knee/Hip Exercises: Standing   Forward Step Up  Right;Hand Hold: 2;Step Height: 6"      Knee/Hip Exercises: Seated   Long Arc Quad  AROM;Strengthening;Right;1 set;10 reps    Long [employer] Limitations  with ball squeeze      Knee/Hip Exercises: Supine   Short Arc Quad Sets  AROM;Strengthening;Right;20 reps    Short Arc Quad Sets Limitations  with ball squeeze    Straight Leg Raises  Right;1 set;10 reps    Straight Leg Raises Limitations  from elevated bolster due to unable to do from mat table (SAQ into SLR today)               PT Short Term Goals - 09/09/19 0835      PT SHORT TERM GOAL #1   Title  Pt will report improved pain by at least 20%    Baseline  yes    Status  Achieved      PT  SHORT TERM GOAL #2   Title  Pt will achieve improved Rt  knee edema to </= 1.5cm difference at mid-patella compared to Lt knee.    Status  On-going      PT SHORT TERM GOAL #3   Title  Pt will be ind in initial HEP including scar massage and edema management and report daily compliance.    Status  Achieved      PT SHORT TERM GOAL #4   Title  Pt will achieve knee flexion to 95 deg or more for improved sit to stand and stand to sit transfers.    Status  Achieved        PT Long Term Goals - 09/09/19 0837      PT LONG TERM GOAL #1   Title  Pt will be ind with advanced HEP and understand how to safely progress.    Baseline  compliant with initial HEP, progressing as tol for strength, flexibility and ROM    Status  On-going      PT LONG TERM GOAL #2   Title  Pt will achieve Rt knee flexion of at least 125 deg and extension to 0 degrees for functional tasks such as gait, alternating LE stair pattern, and sit to stand transfers.    Baseline  -10 to 95 A/ROM on Rt knee    Status  On-going      PT LONG TERM GOAL #3   Title  Pt will be able to tolerate standing tasks >/= 45 min to enable household tasks such as cooking and cleaning.    Baseline  20 min    Status  On-going      PT LONG TERM GOAL #4   Title  Pt will be able to demo symmetrical gait pattern without use of AD for unlimited return to running errands and daily tasks independently with optimal Rt LE function.    Baseline  transtioned to straight cane for shorter duration errands, uses walker if longer than 20 min    Status  On-going      PT LONG TERM GOAL #5   Title  Pt will achieve 5/5 strength surrounding Rt knee to improve daily task performance including ascending/descending 3 flights of stairs to get to apartment.    Baseline  3+/5 ext, 4-/5 flexion    Status  On-going            Plan - 09/09/19 0841    Clinical Impression Statement  Pt making good progress on ROM and has transitioned to straight cane for most of  the day.  Rt knee measures -10 to 95 today.  Edema continues to be present with 37cm circumferential measured mid-patella (same as eval).  She is able to use cane with improved acceptance of weight through Rt LE today with only mild antalgia.  Standing and walking tolerance is up to 20 min. She uses alternating pattern for ascending stairs with one rail and straight cane.  Pain ranges from 3-5/10 throughout day.  She is currently unable to perform SLR from mat table.  Atrophy remains in Rt VMO.  PT gave SAQ to SLR from bolster for HEP and step ups for stair training today with encouragement of use of glut and quad vs momentum.  Pt will continue to benefit from skilled progression for edema management, ROM, flexibility and strength to improve tolerance of all daily activities.    Comorbidities  Hx of lumbar lami summer 2020, has some ongoing lumbar pain, had Lt LE weakness prior to lumbar surgery    Rehab Potential  Excellent  PT Frequency  2x / week    PT Duration  8 weeks    PT Treatment/Interventions  ADLs/Self Care Home Management;Aquatic Therapy;Electrical Stimulation;Cryotherapy;Moist Heat;Neuromuscular re-education;Balance training;Therapeutic exercise;Therapeutic activities;Functional mobility training;Stair training;Gait training;Patient/family education;Manual techniques;Scar mobilization;Passive range of motion;Dry needling;Taping;Joint Manipulations    PT Next Visit Plan  ROM, gastroc and ham stretches, SAQ to SLR, step ups, gait training, recumbant bike, retrograde massage, game ready    PT Home Exercise Plan  Access Code: [email or web address], [email or web address]    Consulted and Agree with Plan of Care  Patient       Patient will benefit from skilled therapeutic intervention in order to improve the following deficits and impairments:     Visit Diagnosis: Acute pain of right knee  Muscle weakness (generalized)  Chronic bilateral low back pain, unspecified whether sciatica present     Problem  List Patient Active Problem List   Diagnosis Date Noted  . Status post total right knee replacement 08/08/2019  . Pelvic pain in female 08/26/2017  . Pelvic peritoneal adhesions, female 08/26/2017  . Endometriosis of pelvis 08/26/2017  . Unilateral primary osteoarthritis, right knee 06/09/2017  . Unilateral primary osteoarthritis, left knee 06/09/2017  . Family history of breast cancer in mother 12/06/2015  . Menorrhagia 05/31/2013  . Dysmenorrhea 05/31/2013  . Dyspareunia 05/31/2013  . Asthma 08/05/2011   Baruch Merl, PT 09/09/19 8:48 AM   [facility] [address] [place], [place], [address] Phone: [phone]   Fax:  [phone]  Name: [name] [name] MRN: [id number] Date of Birth: 1981/06/11

## 2019-09-12 ENCOUNTER — Telehealth: Payer: Self-pay | Admitting: Orthopaedic Surgery

## 2019-09-12 NOTE — Telephone Encounter (Signed)
Patient called. She would like a refill on hydrocodone. Her call back number is [phone]

## 2019-09-12 NOTE — Telephone Encounter (Signed)
Please advise 

## 2019-09-14 ENCOUNTER — Other Ambulatory Visit: Payer: Self-pay

## 2019-09-14 ENCOUNTER — Other Ambulatory Visit: Payer: Self-pay | Admitting: Physician Assistant

## 2019-09-14 ENCOUNTER — Encounter: Payer: Self-pay | Admitting: Physical Therapy

## 2019-09-14 ENCOUNTER — Ambulatory Visit: Payer: Medicaid Other | Admitting: Physical Therapy

## 2019-09-14 DIAGNOSIS — M25561 Pain in right knee: Secondary | ICD-10-CM | POA: Diagnosis not present

## 2019-09-14 DIAGNOSIS — M545 Low back pain, unspecified: Secondary | ICD-10-CM

## 2019-09-14 DIAGNOSIS — M6281 Muscle weakness (generalized): Secondary | ICD-10-CM

## 2019-09-14 DIAGNOSIS — G8929 Other chronic pain: Secondary | ICD-10-CM

## 2019-09-14 MED ORDER — HYDROCODONE-ACETAMINOPHEN 5-325 MG PO TABS: 1.0000 | ORAL_TABLET | Freq: Three times a day (TID) | ORAL | 0 refills | Status: DC | PRN

## 2019-09-14 NOTE — Telephone Encounter (Signed)
Sent in

## 2019-09-14 NOTE — Therapy (Signed)
[facility] [address], [place], [place], [address] Phone: [phone]   Fax:  [phone]  Physical Therapy Treatment  Patient Details  Name: [name] [name] MRN: [id number] Date of Birth: 06/30/1981 Referring Provider (PT): Aundra Dubin, [place]   Encounter Date: 09/14/2019  PT End of Session - 09/14/19 1231    Visit Number  5    Date for PT Re-Evaluation  10/21/19    Authorization Type  Medicaid - will submit for auth    Authorization - Visit Number  4    Authorization - Number of Visits  4    PT Start Time  [id number]    PT Stop Time  1320    PT Time Calculation (min)  50 min    Activity Tolerance  Patient tolerated treatment well    Behavior During Therapy  [facility] for tasks assessed/performed       Past Medical History:  Diagnosis Date  . Allergy   . Arthritis    right knee  . Asthma   . Constipation   . Depression   . GERD (gastroesophageal reflux disease)   . History of kidney stones   . Panic disorder   . Pneumonia   . Renal disorder   . Seasonal allergies   . SVD (spontaneous vaginal delivery)    x 3    Past Surgical History:  Procedure Laterality Date  . ABDOMINAL HYSTERECTOMY    . adiana    . BILATERAL SALPINGECTOMY Bilateral 05/31/2013   Procedure: BILATERAL SALPINGECTOMY;  Surgeon: Cheri Fowler, MD;  Location: [facility];  Service: Gynecology;  Laterality: Bilateral;  . BREAST CYST ASPIRATION Right 2015  . COLONOSCOPY    . DILATION AND CURETTAGE OF UTERUS    . DNC    . KIDNEY STONE SURGERY    . KNEE ARTHROSCOPY Right   . KNEE ARTHROSCOPY WITH DRILLING/MICROFRACTURE Right 07/26/2014   Procedure: RIGHT KNEE ARTHROSCOPY WITH CHONDROPLASTY, MICROFRACTURE;  Surgeon: Marianna Payment, MD;  Location: [facility];  Service: Orthopedics;  Laterality: Right;  . KNEE ARTHROSCOPY WITH MEDIAL MENISECTOMY Right 07/26/2014   Procedure: KNEE ARTHROSCOPY WITH MEDIAL MENISECTOMY;  Surgeon:  Marianna Payment, MD;  Location: [facility];  Service: Orthopedics;  Laterality: Right;  . LAPAROSCOPIC ASSISTED VAGINAL HYSTERECTOMY N/A 05/31/2013   Procedure: LAPAROSCOPIC ASSISTED VAGINAL HYSTERECTOMY;  Surgeon: Cheri Fowler, MD;  Location: [facility];  Service: Gynecology;  Laterality: N/A;  . LAPAROSCOPY N/A 08/26/2017   Procedure: LAPAROSCOPY OPERATIVE WITH PERIOTONEAL BIOPSY AND FULGERATION OF ENDOMETRIOSIS;  Surgeon: Cheri Fowler, MD;  Location: [facility];  Service: Gynecology;  Laterality: N/A;  . LUMBAR LAMINECTOMY/DECOMPRESSION MICRODISCECTOMY Right 12/31/2018   Procedure: MICRODISCECTOMY LUMBAR 5- SACRAL1;  Surgeon: Consuella Lose, MD;  Location: [facility];  Service: Neurosurgery;  Laterality: Right;  MICRODISCECTOMY LUMBAR 5- SACRAL1  . PARTIAL HYSTERECTOMY    . TONSILLECTOMY    . TONSILLECTOMY AND ADENOIDECTOMY    . TOTAL KNEE ARTHROPLASTY Right 08/08/2019  . TOTAL KNEE ARTHROPLASTY Right 08/08/2019   Procedure: RIGHT TOTAL KNEE ARTHROPLASTY;  Surgeon: Leandrew Koyanagi, MD;  Location: [facility];  Service: Orthopedics;  Laterality: Right;  . TUBAL LIGATION    . UPPER GI ENDOSCOPY     ulcer    There were no vitals filed for this visit.  Subjective Assessment - 09/14/19 1233    Subjective  Walking a lot. No pain but swelling. My knee tightness is "better."    Pertinent History  lumbar surgery  Currently in Pain?  No/denies                       [facility] Adult PT Treatment/Exercise - 09/14/19 0001      Knee/Hip Exercises: Stretches   Gastroc Stretch  Right;3 reps;20 seconds    Gastroc Stretch Limitations  slant board      Knee/Hip Exercises: Aerobic   Nustep  L1 x 10 min discussion of  status       Knee/Hip Exercises: Standing   Rebounder  3 way weight shifting 1 min each way   VC to not bend the right knee, try to contract quad   Other Standing Knee Exercises  SLS RT with LT toe taps 2x10 on stairs      Knee/Hip Exercises: Supine   Quad Sets   Strengthening;Right;2 sets;5 reps    Quad Sets Limitations  5 sec holds VC to not use her back    Short [employer]  AROM;Strengthening;Right;2 sets;5 reps    Short [employer] Limitations  Tapping to quad    Straight Leg Raises  Strengthening;2 sets;5 reps    Straight Leg Raises Limitations  VC to get as much extension first/tighten quad then lift.       Vasopneumatic   Number Minutes Vasopneumatic   10 minutes    Vasopnuematic Location   Knee    Vasopneumatic Pressure  Medium    Vasopneumatic Temperature   3 flakes      Manual Therapy   Passive ROM  Knee extensionstatic stretching                PT Short Term Goals - 09/09/19 0835      PT SHORT TERM GOAL #1   Title  Pt will report improved pain by at least 20%    Baseline  yes    Status  Achieved      PT SHORT TERM GOAL #2   Title  Pt will achieve improved Rt knee edema to </= 1.5cm difference at mid-patella compared to Lt knee.    Status  On-going      PT SHORT TERM GOAL #3   Title  Pt will be ind in initial HEP including scar massage and edema management and report daily compliance.    Status  Achieved      PT SHORT TERM GOAL #4   Title  Pt will achieve knee flexion to 95 deg or more for improved sit to stand and stand to sit transfers.    Status  Achieved        PT Long Term Goals - 09/09/19 0837      PT LONG TERM GOAL #1   Title  Pt will be ind with advanced HEP and understand how to safely progress.    Baseline  compliant with initial HEP, progressing as tol for strength, flexibility and ROM    Status  On-going      PT LONG TERM GOAL #2   Title  Pt will achieve Rt knee flexion of at least 125 deg and extension to 0 degrees for functional tasks such as gait, alternating LE stair pattern, and sit to stand transfers.    Baseline  -10 to 95 A/ROM on Rt knee    Status  On-going      PT LONG TERM GOAL #3   Title  Pt will be able to tolerate standing tasks >/= 45 min to enable household tasks such as  cooking and  cleaning.    Baseline  20 min    Status  On-going      PT LONG TERM GOAL #4   Title  Pt will be able to demo symmetrical gait pattern without use of AD for unlimited return to running errands and daily tasks independently with optimal Rt LE function.    Baseline  transtioned to straight cane for shorter duration errands, uses walker if longer than 20 min    Status  On-going      PT LONG TERM GOAL #5   Title  Pt will achieve 5/5 strength surrounding Rt knee to improve daily task performance including ascending/descending 3 flights of stairs to get to apartment.    Baseline  3+/5 ext, 4-/5 flexion    Status  On-going            Plan - 09/14/19 1232    Clinical Impression Statement  Quad set remains poor. PTA encouraged pt to work more on her quad setting and SAQ at home. Pt initially used alot of her QL to stretch the leg away vs using her quad. This did improve slightly during the sesison as we practiced.    Personal Factors and Comorbidities  Comorbidity 1    Comorbidities  Hx of lumbar lami summer 2020, has some ongoing lumbar pain, had Lt LE weakness prior to lumbar surgery    Examination-Activity Limitations  Locomotion Level;Transfers;Bed Mobility;Sit;Sleep;Squat;Stairs;Stand;Lift    Examination-Participation Restrictions  Community Activity;Driving;Cleaning;Meal Prep;Laundry    Stability/Clinical Decision Making  Stable/Uncomplicated    Rehab Potential  Excellent    PT Frequency  2x / week    PT Duration  8 weeks    PT Treatment/Interventions  ADLs/Self Care Home Management;Aquatic Therapy;Electrical Stimulation;Cryotherapy;Moist Heat;Neuromuscular re-education;Balance training;Therapeutic exercise;Therapeutic activities;Functional mobility training;Stair training;Gait training;Patient/family education;Manual techniques;Scar mobilization;Passive range of motion;Dry needling;Taping;Joint Manipulations    PT Next Visit Plan  ROM, gastroc and ham stretches, SAQ to SLR,  step ups, gait training, recumbant bike, retrograde massage, game ready    PT Home Exercise Plan  Access Code: [email or web address], [email or web address]    Consulted and Agree with Plan of Care  Patient       Patient will benefit from skilled therapeutic intervention in order to improve the following deficits and impairments:  Abnormal gait, Decreased range of motion, Difficulty walking, Decreased activity tolerance, Pain, Decreased balance, Decreased scar mobility, Hypomobility, Impaired flexibility, Improper body mechanics, Decreased mobility, Decreased strength, Increased edema, Postural dysfunction  Visit Diagnosis: Acute pain of right knee  Muscle weakness (generalized)  Chronic bilateral low back pain, unspecified whether sciatica present     Problem List Patient Active Problem List   Diagnosis Date Noted  . Status post total right knee replacement 08/08/2019  . Pelvic pain in female 08/26/2017  . Pelvic peritoneal adhesions, female 08/26/2017  . Endometriosis of pelvis 08/26/2017  . Unilateral primary osteoarthritis, right knee 06/09/2017  . Unilateral primary osteoarthritis, left knee 06/09/2017  . Family history of breast cancer in mother 12/06/2015  . Menorrhagia 05/31/2013  . Dysmenorrhea 05/31/2013  . Dyspareunia 05/31/2013  . Asthma 08/05/2011    Keir Foland, PTA 09/14/2019, 1:09 PM  [facility] [address], [place], [place], [address] Phone: [phone]   Fax:  [phone]  Name: [name] [name] MRN: [id number] Date of Birth: July 21, 1980

## 2019-09-14 NOTE — Telephone Encounter (Signed)
LMOM to notify patient

## 2019-09-16 ENCOUNTER — Encounter: Payer: Self-pay | Admitting: Physical Therapy

## 2019-09-16 ENCOUNTER — Ambulatory Visit: Payer: Medicaid Other | Admitting: Physical Therapy

## 2019-09-16 ENCOUNTER — Other Ambulatory Visit: Payer: Self-pay

## 2019-09-16 DIAGNOSIS — M6281 Muscle weakness (generalized): Secondary | ICD-10-CM

## 2019-09-16 DIAGNOSIS — M25561 Pain in right knee: Secondary | ICD-10-CM

## 2019-09-16 DIAGNOSIS — G8929 Other chronic pain: Secondary | ICD-10-CM

## 2019-09-16 NOTE — Therapy (Signed)
[facility] [address] [place], [place], [address] Phone: [phone]   Fax:  [phone]  Physical Therapy Treatment  Patient Details  Name: [name] [name] MRN: [id number] Date of Birth: 02-03-1981 Referring Provider (PT): Aundra Dubin, [place]   Encounter Date: 09/16/2019  PT End of Session - 09/16/19 0853    Visit Number  6    Date for PT Re-Evaluation  10/21/19    Authorization Type  Medicaid - will submit for auth    Authorization Time Period  09/16/19-10/27/19    Authorization - Visit Number  1    Authorization - Number of Visits  12    PT Start Time  [phone]    PT Stop Time  1000    PT Time Calculation (min)  68 min    Activity Tolerance  Patient tolerated treatment well    Behavior During Therapy  [facility] for tasks assessed/performed       Past Medical History:  Diagnosis Date  . Allergy   . Arthritis    right knee  . Asthma   . Constipation   . Depression   . GERD (gastroesophageal reflux disease)   . History of kidney stones   . Panic disorder   . Pneumonia   . Renal disorder   . Seasonal allergies   . SVD (spontaneous vaginal delivery)    x 3    Past Surgical History:  Procedure Laterality Date  . ABDOMINAL HYSTERECTOMY    . adiana    . BILATERAL SALPINGECTOMY Bilateral 05/31/2013   Procedure: BILATERAL SALPINGECTOMY;  Surgeon: Cheri Fowler, MD;  Location: [facility];  Service: Gynecology;  Laterality: Bilateral;  . BREAST CYST ASPIRATION Right 2015  . COLONOSCOPY    . DILATION AND CURETTAGE OF UTERUS    . DNC    . KIDNEY STONE SURGERY    . KNEE ARTHROSCOPY Right   . KNEE ARTHROSCOPY WITH DRILLING/MICROFRACTURE Right 07/26/2014   Procedure: RIGHT KNEE ARTHROSCOPY WITH CHONDROPLASTY, MICROFRACTURE;  Surgeon: Marianna Payment, MD;  Location: [facility];  Service: Orthopedics;  Laterality: Right;  . KNEE ARTHROSCOPY WITH MEDIAL MENISECTOMY Right 07/26/2014   Procedure:  KNEE ARTHROSCOPY WITH MEDIAL MENISECTOMY;  Surgeon: Marianna Payment, MD;  Location: [facility];  Service: Orthopedics;  Laterality: Right;  . LAPAROSCOPIC ASSISTED VAGINAL HYSTERECTOMY N/A 05/31/2013   Procedure: LAPAROSCOPIC ASSISTED VAGINAL HYSTERECTOMY;  Surgeon: Cheri Fowler, MD;  Location: [facility];  Service: Gynecology;  Laterality: N/A;  . LAPAROSCOPY N/A 08/26/2017   Procedure: LAPAROSCOPY OPERATIVE WITH PERIOTONEAL BIOPSY AND FULGERATION OF ENDOMETRIOSIS;  Surgeon: Cheri Fowler, MD;  Location: [facility];  Service: Gynecology;  Laterality: N/A;  . LUMBAR LAMINECTOMY/DECOMPRESSION MICRODISCECTOMY Right 12/31/2018   Procedure: MICRODISCECTOMY LUMBAR 5- SACRAL1;  Surgeon: Consuella Lose, MD;  Location: [facility];  Service: Neurosurgery;  Laterality: Right;  MICRODISCECTOMY LUMBAR 5- SACRAL1  . PARTIAL HYSTERECTOMY    . TONSILLECTOMY    . TONSILLECTOMY AND ADENOIDECTOMY    . TOTAL KNEE ARTHROPLASTY Right 08/08/2019  . TOTAL KNEE ARTHROPLASTY Right 08/08/2019   Procedure: RIGHT TOTAL KNEE ARTHROPLASTY;  Surgeon: Leandrew Koyanagi, MD;  Location: [facility];  Service: Orthopedics;  Laterality: Right;  . TUBAL LIGATION    . UPPER GI ENDOSCOPY     ulcer    There were no vitals filed for this visit.  Subjective Assessment - 09/16/19 0857    Subjective  I felt really good after last session.    Pertinent History  lumbar surgery    Limitations  Walking;Standing;Sitting    Currently in Pain?  No/denies    Multiple Pain Sites  No         [facility] PT Assessment - 09/16/19 0001      AROM   Right Knee Flexion  115                   [facility] Adult PT Treatment/Exercise - 09/16/19 0001      Knee/Hip Exercises: Stretches   Gastroc Stretch  Right;3 reps;20 seconds   rocking in between sets   Gastroc Stretch Limitations  slant board      Knee/Hip Exercises: Aerobic   Stationary Bike  End of session: ROM only : good speed x10 min: initially hip hiking and excessive ankle PF:  this improved during the time.     Nustep  L1 1 min Bil LE  30 sec RTLE only for total duration of 10 min      Knee/Hip Exercises: Standing   Rebounder  3 way weight shifting 1 min each way   VC to not bend the right knee, try to contract quad   Other Standing Knee Exercises  SLS RT with LT toe taps 3x10 on stairs      Knee/Hip Exercises: Seated   Long Arc Quad  Strengthening;Right;2 sets;10 reps   Addaday assist to help facilitate the quad   Ball Squeeze  10x pre LAQ      Vasopneumatic   Number Minutes Vasopneumatic   10 minutes    Vasopnuematic Location   Knee    Vasopneumatic Pressure  Medium    Vasopneumatic Temperature   3 flakes      Manual Therapy   Passive ROM  Knee extensionstatic stretching    distal scar massage              PT Short Term Goals - 09/09/19 0835      PT SHORT TERM GOAL #1   Title  Pt will report improved pain by at least 20%    Baseline  yes    Status  Achieved      PT SHORT TERM GOAL #2   Title  Pt will achieve improved Rt knee edema to </= 1.5cm difference at mid-patella compared to Lt knee.    Status  On-going      PT SHORT TERM GOAL #3   Title  Pt will be ind in initial HEP including scar massage and edema management and report daily compliance.    Status  Achieved      PT SHORT TERM GOAL #4   Title  Pt will achieve knee flexion to 95 deg or more for improved sit to stand and stand to sit transfers.    Status  Achieved        PT Long Term Goals - 09/09/19 0837      PT LONG TERM GOAL #1   Title  Pt will be ind with advanced HEP and understand how to safely progress.    Baseline  compliant with initial HEP, progressing as tol for strength, flexibility and ROM    Status  On-going      PT LONG TERM GOAL #2   Title  Pt will achieve Rt knee flexion of at least 125 deg and extension to 0 degrees for functional tasks such as gait, alternating LE stair pattern, and sit to stand transfers.    Baseline  -10 to 95 A/ROM on Rt knee  Status  On-going      PT LONG TERM GOAL #3   Title  Pt will be able to tolerate standing tasks >/= 45 min to enable household tasks such as cooking and cleaning.    Baseline  20 min    Status  On-going      PT LONG TERM GOAL #4   Title  Pt will be able to demo symmetrical gait pattern without use of AD for unlimited return to running errands and daily tasks independently with optimal Rt LE function.    Baseline  transtioned to straight cane for shorter duration errands, uses walker if longer than 20 min    Status  On-going      PT LONG TERM GOAL #5   Title  Pt will achieve 5/5 strength surrounding Rt knee to improve daily task performance including ascending/descending 3 flights of stairs to get to apartment.    Baseline  3+/5 ext, 4-/5 flexion    Status  On-going            Plan - 09/16/19 0856    Clinical Impression Statement  Pt beginning to get better control of the quad muscle, still a ways to go. No pain with exercises but pt will require a redirection to task when needing to make a quad contraction ( pt can talk). Pt was able to produce a SAQ with the additio of 1# ankle weight and decent quad contraction. Pt was able to stay for longer treatment today. Pt achieved 115 degrees of active flexion at end of session ( PTA assisted last 5 degrees) with minimal discomfort.    Personal Factors and Comorbidities  Comorbidity 1    Comorbidities  Hx of lumbar lami summer 2020, has some ongoing lumbar pain, had Lt LE weakness prior to lumbar surgery    Examination-Activity Limitations  Locomotion Level;Transfers;Bed Mobility;Sit;Sleep;Squat;Stairs;Stand;Lift    Examination-Participation Restrictions  Community Activity;Driving;Cleaning;Meal Prep;Laundry    Stability/Clinical Decision Making  Stable/Uncomplicated    Rehab Potential  Excellent    PT Frequency  2x / week    PT Duration  8 weeks    PT Treatment/Interventions  ADLs/Self Care Home Management;Aquatic Therapy;Electrical  Stimulation;Cryotherapy;Moist Heat;Neuromuscular re-education;Balance training;Therapeutic exercise;Therapeutic activities;Functional mobility training;Stair training;Gait training;Patient/family education;Manual techniques;Scar mobilization;Passive range of motion;Dry needling;Taping;Joint Manipulations    PT Next Visit Plan  Knee ROM & strength specifically quads    PT Home Exercise Plan  Access Code: [email or web address], scar [email or web address]    Consulted and Agree with Plan of Care  Patient       Patient will benefit from skilled therapeutic intervention in order to improve the following deficits and impairments:  Abnormal gait, Decreased range of motion, Difficulty walking, Decreased activity tolerance, Pain, Decreased balance, Decreased scar mobility, Hypomobility, Impaired flexibility, Improper body mechanics, Decreased mobility, Decreased strength, Increased edema, Postural dysfunction  Visit Diagnosis: Acute pain of right knee  Muscle weakness (generalized)  Chronic bilateral low back pain, unspecified whether sciatica present     Problem List Patient Active Problem List   Diagnosis Date Noted  . Status post total right knee replacement 08/08/2019  . Pelvic pain in female 08/26/2017  . Pelvic peritoneal adhesions, female 08/26/2017  . Endometriosis of pelvis 08/26/2017  . Unilateral primary osteoarthritis, right knee 06/09/2017  . Unilateral primary osteoarthritis, left knee 06/09/2017  . Family history of breast cancer in mother 12/06/2015  . Menorrhagia 05/31/2013  . Dysmenorrhea 05/31/2013  . Dyspareunia 05/31/2013  . Asthma 08/05/2011    Michie Molnar, PTA 09/16/2019, 9:55 AM  [facility] [address] [place], [place], [address] Phone: [phone]   Fax:  [phone]  Name: [name] [name] MRN: [id number] Date of Birth: 01/20/1981

## 2019-09-20 ENCOUNTER — Ambulatory Visit (INDEPENDENT_AMBULATORY_CARE_PROVIDER_SITE_OTHER): Payer: Medicaid Other

## 2019-09-20 ENCOUNTER — Other Ambulatory Visit: Payer: Self-pay

## 2019-09-20 ENCOUNTER — Ambulatory Visit (INDEPENDENT_AMBULATORY_CARE_PROVIDER_SITE_OTHER): Payer: Medicaid Other | Admitting: Orthopaedic Surgery

## 2019-09-20 ENCOUNTER — Encounter: Payer: Self-pay | Admitting: Physical Therapy

## 2019-09-20 ENCOUNTER — Encounter: Payer: Self-pay | Admitting: Orthopaedic Surgery

## 2019-09-20 ENCOUNTER — Ambulatory Visit: Payer: Medicaid Other | Admitting: Physical Therapy

## 2019-09-20 VITALS — Ht 62.0 in | Wt 147.0 lb

## 2019-09-20 DIAGNOSIS — M25561 Pain in right knee: Secondary | ICD-10-CM

## 2019-09-20 DIAGNOSIS — G8929 Other chronic pain: Secondary | ICD-10-CM

## 2019-09-20 DIAGNOSIS — Z96651 Presence of right artificial knee joint: Secondary | ICD-10-CM

## 2019-09-20 DIAGNOSIS — M6281 Muscle weakness (generalized): Secondary | ICD-10-CM

## 2019-09-20 DIAGNOSIS — M545 Low back pain, unspecified: Secondary | ICD-10-CM

## 2019-09-20 NOTE — Therapy (Signed)
[facility] [address], [place], [place], [address] Phone: [phone]   Fax:  [phone]  Physical Therapy Treatment  Patient Details  Name: [name] [name] MRN: [id number] Date of Birth: 17-Nov-1980 Referring Provider (PT): Aundra Dubin, [place]   Encounter Date: 09/20/2019  PT End of Session - 09/20/19 1534    Visit Number  7    Date for PT Re-Evaluation  10/21/19    Authorization Type  Medicaid    Authorization Time Period  09/16/19-10/27/19    Authorization - Visit Number  2    Authorization - Number of Visits  12    PT Start Time  [id number]    PT Stop Time  1625    PT Time Calculation (min)  55 min    Activity Tolerance  Patient tolerated treatment well    Behavior During Therapy  [facility] for tasks assessed/performed       Past Medical History:  Diagnosis Date  . Allergy   . Arthritis    right knee  . Asthma   . Constipation   . Depression   . GERD (gastroesophageal reflux disease)   . History of kidney stones   . Panic disorder   . Pneumonia   . Renal disorder   . Seasonal allergies   . SVD (spontaneous vaginal delivery)    x 3    Past Surgical History:  Procedure Laterality Date  . ABDOMINAL HYSTERECTOMY    . adiana    . BILATERAL SALPINGECTOMY Bilateral 05/31/2013   Procedure: BILATERAL SALPINGECTOMY;  Surgeon: Cheri Fowler, MD;  Location: [facility];  Service: Gynecology;  Laterality: Bilateral;  . BREAST CYST ASPIRATION Right 2015  . COLONOSCOPY    . DILATION AND CURETTAGE OF UTERUS    . DNC    . KIDNEY STONE SURGERY    . KNEE ARTHROSCOPY Right   . KNEE ARTHROSCOPY WITH DRILLING/MICROFRACTURE Right 07/26/2014   Procedure: RIGHT KNEE ARTHROSCOPY WITH CHONDROPLASTY, MICROFRACTURE;  Surgeon: Marianna Payment, MD;  Location: [facility];  Service: Orthopedics;  Laterality: Right;  . KNEE ARTHROSCOPY WITH MEDIAL MENISECTOMY Right 07/26/2014   Procedure: KNEE ARTHROSCOPY WITH  MEDIAL MENISECTOMY;  Surgeon: Marianna Payment, MD;  Location: [facility];  Service: Orthopedics;  Laterality: Right;  . LAPAROSCOPIC ASSISTED VAGINAL HYSTERECTOMY N/A 05/31/2013   Procedure: LAPAROSCOPIC ASSISTED VAGINAL HYSTERECTOMY;  Surgeon: Cheri Fowler, MD;  Location: [facility];  Service: Gynecology;  Laterality: N/A;  . LAPAROSCOPY N/A 08/26/2017   Procedure: LAPAROSCOPY OPERATIVE WITH PERIOTONEAL BIOPSY AND FULGERATION OF ENDOMETRIOSIS;  Surgeon: Cheri Fowler, MD;  Location: [facility];  Service: Gynecology;  Laterality: N/A;  . LUMBAR LAMINECTOMY/DECOMPRESSION MICRODISCECTOMY Right 12/31/2018   Procedure: MICRODISCECTOMY LUMBAR 5- SACRAL1;  Surgeon: Consuella Lose, MD;  Location: [facility];  Service: Neurosurgery;  Laterality: Right;  MICRODISCECTOMY LUMBAR 5- SACRAL1  . PARTIAL HYSTERECTOMY    . TONSILLECTOMY    . TONSILLECTOMY AND ADENOIDECTOMY    . TOTAL KNEE ARTHROPLASTY Right 08/08/2019  . TOTAL KNEE ARTHROPLASTY Right 08/08/2019   Procedure: RIGHT TOTAL KNEE ARTHROPLASTY;  Surgeon: Leandrew Koyanagi, MD;  Location: [facility];  Service: Orthopedics;  Laterality: Right;  . TUBAL LIGATION    . UPPER GI ENDOSCOPY     ulcer    There were no vitals filed for this visit.  Subjective Assessment - 09/20/19 1532    Subjective  Sleep and pain are much improved.  I saw orthopedist today and he was pleased with my  progress.    Pertinent History  lumbar surgery    Limitations  Walking;Standing;Sitting    How long can you sit comfortably?  1 hour    How long can you stand comfortably?  20    How long can you walk comfortably?  20 with cane    Patient Stated Goals  sleep through night (restless leg and throbbing Rt leg), drive, run errands, standing for cooking    Currently in Pain?  No/denies    Pain Score  --   just stiffness   Pain Onset  1 to 4 weeks ago                       [facility] Adult PT Treatment/Exercise - 09/20/19 0001      Exercises   Exercises   Knee/Hip      Knee/Hip Exercises: Stretches   Active Hamstring Stretch  Right;2 reps;30 seconds    Active Hamstring Stretch Limitations  foot on second step    Gastroc Stretch  Right;2 reps;20 seconds    Gastroc Stretch Limitations  slant board    Other Knee/Hip Stretches  Rt knee AA/ROM flexion foot on second step x 10 reps      Knee/Hip Exercises: Aerobic   Stationary Bike  beginning of session: ROM only x 5', L3 x 3' but Pt had to drop to L2 for last min, PT present to discuss progress      Knee/Hip Exercises: Machines for Strengthening   Cybex Leg Press  50# bil LEs with ball squeeze 1x10      Knee/Hip Exercises: Standing   Terminal Knee Extension  Strengthening;Right;15 reps;Theraband    Theraband Level (Terminal Knee Extension)  Level 2 (Red)      Modalities   Modalities  Cryotherapy      Cryotherapy   Number Minutes Cryotherapy  10 Minutes    Cryotherapy Location  Knee    Type of Cryotherapy  Ice pack      Electrical Stimulation   Electrical Stimulation Location  Rt quad    Electrical Stimulation Action  russian    Electrical Stimulation Parameters  10 min, to tolerance, 5 sec ramp, 10/10 on/off, with quad sets x 2 min, SAQ x 2 min, SAQ to SLR x 2', LAQ x 2 min, standing weight shifts onto Rt LE x 2'    Printmaker Goals  Strength               PT Short Term Goals - 09/20/19 1612      PT SHORT TERM GOAL #1   Title  Pt will report improved pain by at least 20%    Status  Achieved      PT SHORT TERM GOAL #2   Title  Pt will achieve improved Rt knee edema to </= 1.5cm difference at mid-patella compared to Lt knee.    Status  On-going      PT SHORT TERM GOAL #3   Title  Pt will be ind in initial HEP including scar massage and edema management and report daily compliance.    Status  Achieved      PT SHORT TERM GOAL #4   Title  Pt will achieve knee flexion to 95 deg or more for improved sit to stand and stand to sit transfers.    Status  Achieved         PT Long Term Goals - 09/20/19 1613      PT LONG TERM GOAL #  1   Title  Pt will be ind with advanced HEP and understand how to safely progress.    Status  On-going      PT LONG TERM GOAL #2   Title  Pt will achieve Rt knee flexion of at least 125 deg and extension to 0 degrees for functional tasks such as gait, alternating LE stair pattern, and sit to stand transfers.    Status  On-going      PT LONG TERM GOAL #3   Title  Pt will be able to tolerate standing tasks >/= 45 min to enable household tasks such as cooking and cleaning.    Baseline  20 min    Status  On-going      PT LONG TERM GOAL #5   Title  Pt will achieve 5/5 strength surrounding Rt knee to improve daily task performance including ascending/descending 3 flights of stairs to get to apartment.    Status  On-going            Plan - 09/20/19 1604    Clinical Impression Statement  Session today focused on Rt VMO/quad activation and strength.  PT used [place] stim with 10/10 on/off throughout a series of exercises in supine, sitting and standing.  Pt with ability to perform SAQ to SLR with use of stim which she isn't able to do without.  PT also introduced leg press and TKEs today.  Pt with improving ROM and flexibility surrounding Rt knee.  She is able to run errands x 20 min but with reported swelling and fatigue afterwards using SPC.  Pt will continue to benefit from skilled PT along POC for improved range and strength following Rt TKR.    Comorbidities  Hx of lumbar lami summer 2020, has some ongoing lumbar pain, had Lt LE weakness prior to lumbar surgery    Rehab Potential  Excellent    PT Frequency  2x / week    PT Duration  8 weeks    PT Treatment/Interventions  ADLs/Self Care Home Management;Aquatic Therapy;Electrical Stimulation;Cryotherapy;Moist Heat;Neuromuscular re-education;Balance training;Therapeutic exercise;Therapeutic activities;Functional mobility training;Stair training;Gait  training;Patient/family education;Manual techniques;Scar mobilization;Passive range of motion;Dry needling;Taping;Joint Manipulations    PT Next Visit Plan  continue strength progression with use of Russian stim as needed for improved Rt quad, knee ROM, LE stretching to assist with improved knee ROM, f/u on tol of new ther ex    PT Home Exercise Plan  Access Code: [email or web address], [email or web address]    Consulted and Agree with Plan of Care  Patient       Patient will benefit from skilled therapeutic intervention in order to improve the following deficits and impairments:     Visit Diagnosis: Acute pain of right knee  Muscle weakness (generalized)  Chronic bilateral low back pain, unspecified whether sciatica present     Problem List Patient Active Problem List   Diagnosis Date Noted  . Status post total right knee replacement 08/08/2019  . Pelvic pain in female 08/26/2017  . Pelvic peritoneal adhesions, female 08/26/2017  . Endometriosis of pelvis 08/26/2017  . Unilateral primary osteoarthritis, right knee 06/09/2017  . Unilateral primary osteoarthritis, left knee 06/09/2017  . Family history of breast cancer in mother 12/06/2015  . Menorrhagia 05/31/2013  . Dysmenorrhea 05/31/2013  . Dyspareunia 05/31/2013  . Asthma 08/05/2011    Baruch Merl, PT 09/20/19 4:14 PM   [facility] [address], [place], [place], [address] Phone: [phone]   Fax:  [phone]  Name: [name] [name] MRN: [id number] Date of Birth: 12/01/1980

## 2019-09-20 NOTE — Progress Notes (Signed)
Post-Op Visit Note   Patient: [name] [name]           Date of Birth: 10/09/1980           MRN: [id number] Visit Date: 09/20/2019 PCP: [place]:  Chief Complaint:  Chief Complaint  Patient presents with  . Right Knee - Follow-up    Right TKA DOS 08/08/2019   Visit Diagnoses:  1. Hx of total knee replacement, right     Plan: [place] is 6 weeks status post right total knee replacement.  She is overall doing well.  She is very happy that she no longer has the chronic knee pain at night.  She is doing physical therapy twice a week and making good progress.  She still has some swelling with activity.  She is able to achieve 105 degrees of flexion with physical therapy.  She still lacks about 15 to 20 degrees of full extension.  Surgical scar is fully healed.  The couple of the suture knots were removed today without difficulty.  At this point she may drive when she feels comfortable.  Continue with physical therapy.  Dental prophylaxis reinforced.  Recheck in 6weeks.  Follow-Up Instructions: Return in about 6 weeks (around 11/01/2019).   Orders:  Orders Placed This Encounter  Procedures  . XR Knee 1-2 Views Right   No orders of the defined types were placed in this encounter.   Imaging: XR Knee 1-2 Views Right  Result Date: 09/20/2019 Stable total knee replacement in good alignment    PMFS History: Patient Active Problem List   Diagnosis Date Noted  . Status post total right knee replacement 08/08/2019  . Pelvic pain in female 08/26/2017  . Pelvic peritoneal adhesions, female 08/26/2017  . Endometriosis of pelvis 08/26/2017  . Unilateral primary osteoarthritis, right knee 06/09/2017  . Unilateral primary osteoarthritis, left knee 06/09/2017  . Family history of breast cancer in mother 12/06/2015  . Menorrhagia 05/31/2013  . Dysmenorrhea 05/31/2013  . Dyspareunia 05/31/2013  . Asthma 08/05/2011   Past Medical History:  Diagnosis  Date  . Allergy   . Arthritis    right knee  . Asthma   . Constipation   . Depression   . GERD (gastroesophageal reflux disease)   . History of kidney stones   . Panic disorder   . Pneumonia   . Renal disorder   . Seasonal allergies   . SVD (spontaneous vaginal delivery)    x 3    Family History  Problem Relation Age of Onset  . Hypertension Mother   . Diabetes Mother   . Breast cancer Mother 30       IDC; s/p lumpectomy, radiation, and arimidex  . Prostate cancer Paternal Grandfather        dx. 50s-60s  . Other Sister        both full sisters also have dense breast tissue  . Breast cancer Maternal Grandmother 83       IDC  . Colon polyps Maternal Grandmother        approx 4-5 colon polyps  . Pancreatic cancer Other        maternal great aunt (MGF's sister) d. pancreatic cancer  . Cancer Other        maternal great aunt (MGF's sister) dx NOS cancer  . Cancer Other        maternal great grandmother (MGF's mother) d. NOS cancer in her 47s  . Cancer Cousin 25  paternal 1st cousin dx. w/ either throat or thyroid cancer; not a smoker  . Breast cancer Other        paternal great aunt (PGF's sister) dx. breast cancer, 64s  . Colon cancer Neg Hx   . Rectal cancer Neg Hx   . Stomach cancer Neg Hx   . Esophageal cancer Neg Hx     Past Surgical History:  Procedure Laterality Date  . ABDOMINAL HYSTERECTOMY    . adiana    . BILATERAL SALPINGECTOMY Bilateral 05/31/2013   Procedure: BILATERAL SALPINGECTOMY;  Surgeon: Cheri Fowler, MD;  Location: [facility];  Service: Gynecology;  Laterality: Bilateral;  . BREAST CYST ASPIRATION Right 2015  . COLONOSCOPY    . DILATION AND CURETTAGE OF UTERUS    . DNC    . KIDNEY STONE SURGERY    . KNEE ARTHROSCOPY Right   . KNEE ARTHROSCOPY WITH DRILLING/MICROFRACTURE Right 07/26/2014   Procedure: RIGHT KNEE ARTHROSCOPY WITH CHONDROPLASTY, MICROFRACTURE;  Surgeon: Marianna Payment, MD;  Location: [facility];  Service:  Orthopedics;  Laterality: Right;  . KNEE ARTHROSCOPY WITH MEDIAL MENISECTOMY Right 07/26/2014   Procedure: KNEE ARTHROSCOPY WITH MEDIAL MENISECTOMY;  Surgeon: Marianna Payment, MD;  Location: [facility];  Service: Orthopedics;  Laterality: Right;  . LAPAROSCOPIC ASSISTED VAGINAL HYSTERECTOMY N/A 05/31/2013   Procedure: LAPAROSCOPIC ASSISTED VAGINAL HYSTERECTOMY;  Surgeon: Cheri Fowler, MD;  Location: [facility];  Service: Gynecology;  Laterality: N/A;  . LAPAROSCOPY N/A 08/26/2017   Procedure: LAPAROSCOPY OPERATIVE WITH PERIOTONEAL BIOPSY AND FULGERATION OF ENDOMETRIOSIS;  Surgeon: Cheri Fowler, MD;  Location: [facility];  Service: Gynecology;  Laterality: N/A;  . LUMBAR LAMINECTOMY/DECOMPRESSION MICRODISCECTOMY Right 12/31/2018   Procedure: MICRODISCECTOMY LUMBAR 5- SACRAL1;  Surgeon: Consuella Lose, MD;  Location: [facility];  Service: Neurosurgery;  Laterality: Right;  MICRODISCECTOMY LUMBAR 5- SACRAL1  . PARTIAL HYSTERECTOMY    . TONSILLECTOMY    . TONSILLECTOMY AND ADENOIDECTOMY    . TOTAL KNEE ARTHROPLASTY Right 08/08/2019  . TOTAL KNEE ARTHROPLASTY Right 08/08/2019   Procedure: RIGHT TOTAL KNEE ARTHROPLASTY;  Surgeon: Leandrew Koyanagi, MD;  Location: [facility];  Service: Orthopedics;  Laterality: Right;  . TUBAL LIGATION    . UPPER GI ENDOSCOPY     ulcer   Social History   Occupational History  . Not on file  Tobacco Use  . Smoking status: Never Smoker  . Smokeless tobacco: Never Used  Substance and Sexual Activity  . Alcohol use: No    Alcohol/week: 0.0 standard drinks  . Drug use: No  . Sexual activity: Not on file

## 2019-09-22 ENCOUNTER — Ambulatory Visit: Payer: Medicaid Other | Admitting: Physical Therapy

## 2019-09-22 ENCOUNTER — Encounter: Payer: Self-pay | Admitting: Physical Therapy

## 2019-09-22 ENCOUNTER — Other Ambulatory Visit: Payer: Self-pay

## 2019-09-22 DIAGNOSIS — M25561 Pain in right knee: Secondary | ICD-10-CM | POA: Diagnosis not present

## 2019-09-22 DIAGNOSIS — M6281 Muscle weakness (generalized): Secondary | ICD-10-CM

## 2019-09-22 DIAGNOSIS — M545 Low back pain, unspecified: Secondary | ICD-10-CM

## 2019-09-22 DIAGNOSIS — G8929 Other chronic pain: Secondary | ICD-10-CM

## 2019-09-22 NOTE — Therapy (Signed)
[facility] [address], [place], [place], [address] Phone: [phone]   Fax:  [phone]  Physical Therapy Treatment  Patient Details  Name: [name] [name] MRN: [id number] Date of Birth: December 12, 1980 Referring Provider (PT): Aundra Dubin, [place]   Encounter Date: 09/22/2019  PT End of Session - 09/22/19 1540    Visit Number  8    Date for PT Re-Evaluation  10/21/19    Authorization Type  Medicaid    Authorization Time Period  09/16/19-10/27/19    Authorization - Visit Number  3    Authorization - Number of Visits  12    PT Start Time  [id number]    PT Stop Time  1617    PT Time Calculation (min)  42 min    Activity Tolerance  Patient tolerated treatment well    Behavior During Therapy  [facility] for tasks assessed/performed       Past Medical History:  Diagnosis Date  . Allergy   . Arthritis    right knee  . Asthma   . Constipation   . Depression   . GERD (gastroesophageal reflux disease)   . History of kidney stones   . Panic disorder   . Pneumonia   . Renal disorder   . Seasonal allergies   . SVD (spontaneous vaginal delivery)    x 3    Past Surgical History:  Procedure Laterality Date  . ABDOMINAL HYSTERECTOMY    . adiana    . BILATERAL SALPINGECTOMY Bilateral 05/31/2013   Procedure: BILATERAL SALPINGECTOMY;  Surgeon: Cheri Fowler, MD;  Location: [facility];  Service: Gynecology;  Laterality: Bilateral;  . BREAST CYST ASPIRATION Right 2015  . COLONOSCOPY    . DILATION AND CURETTAGE OF UTERUS    . DNC    . KIDNEY STONE SURGERY    . KNEE ARTHROSCOPY Right   . KNEE ARTHROSCOPY WITH DRILLING/MICROFRACTURE Right 07/26/2014   Procedure: RIGHT KNEE ARTHROSCOPY WITH CHONDROPLASTY, MICROFRACTURE;  Surgeon: Marianna Payment, MD;  Location: [facility];  Service: Orthopedics;  Laterality: Right;  . KNEE ARTHROSCOPY WITH MEDIAL MENISECTOMY Right 07/26/2014   Procedure: KNEE ARTHROSCOPY WITH  MEDIAL MENISECTOMY;  Surgeon: Marianna Payment, MD;  Location: [facility];  Service: Orthopedics;  Laterality: Right;  . LAPAROSCOPIC ASSISTED VAGINAL HYSTERECTOMY N/A 05/31/2013   Procedure: LAPAROSCOPIC ASSISTED VAGINAL HYSTERECTOMY;  Surgeon: Cheri Fowler, MD;  Location: [facility];  Service: Gynecology;  Laterality: N/A;  . LAPAROSCOPY N/A 08/26/2017   Procedure: LAPAROSCOPY OPERATIVE WITH PERIOTONEAL BIOPSY AND FULGERATION OF ENDOMETRIOSIS;  Surgeon: Cheri Fowler, MD;  Location: [facility];  Service: Gynecology;  Laterality: N/A;  . LUMBAR LAMINECTOMY/DECOMPRESSION MICRODISCECTOMY Right 12/31/2018   Procedure: MICRODISCECTOMY LUMBAR 5- SACRAL1;  Surgeon: Consuella Lose, MD;  Location: [facility];  Service: Neurosurgery;  Laterality: Right;  MICRODISCECTOMY LUMBAR 5- SACRAL1  . PARTIAL HYSTERECTOMY    . TONSILLECTOMY    . TONSILLECTOMY AND ADENOIDECTOMY    . TOTAL KNEE ARTHROPLASTY Right 08/08/2019  . TOTAL KNEE ARTHROPLASTY Right 08/08/2019   Procedure: RIGHT TOTAL KNEE ARTHROPLASTY;  Surgeon: Leandrew Koyanagi, MD;  Location: [facility];  Service: Orthopedics;  Laterality: Right;  . TUBAL LIGATION    . UPPER GI ENDOSCOPY     ulcer    There were no vitals filed for this visit.  Subjective Assessment - 09/22/19 1536    Subjective  I did great after last week.    Pertinent History  lumbar surgery  Limitations  Walking;Standing;Sitting    How long can you sit comfortably?  1 hour    How long can you stand comfortably?  20    How long can you walk comfortably?  20 with cane    Patient Stated Goals  sleep through night (restless leg and throbbing Rt leg), drive, run errands, standing for cooking    Currently in Pain?  No/denies    Pain Score  --   stiffness   Pain Orientation  Right    Pain Descriptors / Indicators  Tightness    Pain Type  Surgical pain    Aggravating Factors   not moving    Pain Relieving Factors  ice, meds, stretching    Effect of Pain on Daily Activities  not  driving yet                       [facility] Adult PT Treatment/Exercise - 09/22/19 0001      Knee/Hip Exercises: Stretches   Active Hamstring Stretch  Right;2 reps;30 seconds    Active Hamstring Stretch Limitations  foot on second step    Gastroc Stretch  Right;2 reps;20 seconds    Gastroc Stretch Limitations  slant board    Other Knee/Hip Stretches  Rt knee AA/ROM flexion foot on second step x 3 sec x 20 reps      Knee/Hip Exercises: Aerobic   Stationary Bike  beginning of session L2 x 8', PT present to discuss strategies for loosening the knee on waking in AM      Knee/Hip Exercises: Machines for Strengthening   Cybex Leg Press  50# bil LEs with ball squeeze 1x20      Knee/Hip Exercises: Standing   Terminal Knee Extension  Strengthening;Right;15 reps;Theraband   5 sec hold, PT cued WB through Rt LE during TKE   Theraband Level (Terminal Knee Extension)  Level 2 (Red)    Forward Step Up  Right;Hand Hold: 1;Step Height: 4";1 set;10 reps    Gait Training  stand tall through Rt hip during stance phase      Knee/Hip Exercises: Seated   Long Arc Quad  Right;2 sets;10 reps    [employer] Limitations  with ball squeeze, first set on dynadisc for core but d/c'd due to imbalance    Sit to [employer]  5 reps;without UE support   PT cued hip hinge and equal WB through bil LEs     Knee/Hip Exercises: Supine   Straight Leg Raises  Strengthening;Right;3 sets;5 reps    Straight Leg Raises Limitations  AA/ROM first set only, PT cued Pt to exhale on effort      Knee/Hip Exercises: Sidelying   Hip ABduction  Strengthening;Right;2 sets;5 sets    Hip ABduction Limitations  exhale on effort    Hip ADduction  Strengthening;Right;2 sets;5 reps               PT Short Term Goals - 09/20/19 1612      PT SHORT TERM GOAL #1   Title  Pt will report improved pain by at least 20%    Status  Achieved      PT SHORT TERM GOAL #2   Title  Pt will achieve improved Rt knee edema to </=  1.5cm difference at mid-patella compared to Lt knee.    Status  On-going      PT SHORT TERM GOAL #3   Title  Pt will be ind in initial HEP including scar massage and  edema management and report daily compliance.    Status  Achieved      PT SHORT TERM GOAL #4   Title  Pt will achieve knee flexion to 95 deg or more for improved sit to stand and stand to sit transfers.    Status  Achieved        PT Long Term Goals - 09/20/19 1613      PT LONG TERM GOAL #1   Title  Pt will be ind with advanced HEP and understand how to safely progress.    Status  On-going      PT LONG TERM GOAL #2   Title  Pt will achieve Rt knee flexion of at least 125 deg and extension to 0 degrees for functional tasks such as gait, alternating LE stair pattern, and sit to stand transfers.    Status  On-going      PT LONG TERM GOAL #3   Title  Pt will be able to tolerate standing tasks >/= 45 min to enable household tasks such as cooking and cleaning.    Baseline  20 min    Status  On-going      PT LONG TERM GOAL #5   Title  Pt will achieve 5/5 strength surrounding Rt knee to improve daily task performance including ascending/descending 3 flights of stairs to get to apartment.    Status  On-going            Plan - 09/22/19 1619    Clinical Impression Statement  PT noted improved recruitment of Rt VMO today.  Pt was able to perform A/ROM SLR for first time post-op today.  She did 5 reps sets x 3 sets with PT AA/ROM assist for first set.  She has some difficulty with eccentric lowering phase but no "crash landings."  PT introduced hip abd/add against gravity today which Pt was quick to fatigue with.  Pt able to demo sit to stand with improved equal WB through bil LEs today upon cueing by PT.  Pt is traveling next week and understands importance of continued ROM and HEP adherence.  No ice used today as Pt needed to get to an appointment for COVID vaccine.  She will continue to benefit from skilled PT for ROM,  strength, gait and functional mobility training following Rt knee replacement.    Comorbidities  Hx of lumbar lami summer 2020, has some ongoing lumbar pain, had Lt LE weakness prior to lumbar surgery    Rehab Potential  Excellent    PT Frequency  2x / week    PT Duration  8 weeks    PT Treatment/Interventions  ADLs/Self Care Home Management;Aquatic Therapy;Electrical Stimulation;Cryotherapy;Moist Heat;Neuromuscular re-education;Balance training;Therapeutic exercise;Therapeutic activities;Functional mobility training;Stair training;Gait training;Patient/family education;Manual techniques;Scar mobilization;Passive range of motion;Dry needling;Taping;Joint Manipulations    PT Next Visit Plan  Pt will have traveled and missed a week of PT next visit, check edema/range and address if regression, continue SLR against gravity 3 ways, TKEs, LAQ, sit to stand, leg press, bike    PT Home Exercise Plan  Access Code: [email or web address], [email or web address]    Consulted and Agree with Plan of Care  Patient       Patient will benefit from skilled therapeutic intervention in order to improve the following deficits and impairments:     Visit Diagnosis: Acute pain of right knee  Muscle weakness (generalized)  Chronic bilateral low back pain, unspecified whether sciatica present     Problem List Patient Active Problem List  Diagnosis Date Noted  . Status post total right knee replacement 08/08/2019  . Pelvic pain in female 08/26/2017  . Pelvic peritoneal adhesions, female 08/26/2017  . Endometriosis of pelvis 08/26/2017  . Unilateral primary osteoarthritis, right knee 06/09/2017  . Unilateral primary osteoarthritis, left knee 06/09/2017  . Family history of breast cancer in mother 12/06/2015  . Menorrhagia 05/31/2013  . Dysmenorrhea 05/31/2013  . Dyspareunia 05/31/2013  . Asthma 08/05/2011   Baruch Merl, PT 09/22/19 4:25 PM   [facility] [address] [place], [place], [address] Phone: [phone]   Fax:  [phone]  Name: Joyce Peniston MRN: [id number] Date of Birth: 1981-06-20

## 2019-09-26 ENCOUNTER — Encounter: Payer: Medicaid Other | Admitting: Physical Therapy

## 2019-09-29 ENCOUNTER — Encounter: Payer: Medicaid Other | Admitting: Physical Therapy

## 2019-10-04 ENCOUNTER — Encounter: Payer: Self-pay | Admitting: Physical Therapy

## 2019-10-04 ENCOUNTER — Ambulatory Visit: Payer: Medicaid Other | Admitting: Physical Therapy

## 2019-10-04 ENCOUNTER — Other Ambulatory Visit: Payer: Self-pay

## 2019-10-04 DIAGNOSIS — M25561 Pain in right knee: Secondary | ICD-10-CM | POA: Diagnosis not present

## 2019-10-04 DIAGNOSIS — M6281 Muscle weakness (generalized): Secondary | ICD-10-CM

## 2019-10-04 NOTE — Therapy (Signed)
[facility] [address] [place], [place], [address] Phone: [phone]   Fax:  [phone]  Physical Therapy Treatment  Patient Details  Name: [name] [name] MRN: [id number] Date of Birth: 01/29/1981 Referring Provider (PT): Aundra Dubin, [place]   Encounter Date: 10/04/2019  PT End of Session - 10/04/19 1405    Visit Number  9    Date for PT Re-Evaluation  10/21/19    Authorization Type  Medicaid    Authorization Time Period  09/16/19-10/27/19    Authorization - Visit Number  4    Authorization - Number of Visits  12    PT Start Time  1400    PT Stop Time  1448    PT Time Calculation (min)  48 min    Activity Tolerance  Patient tolerated treatment well    Behavior During Therapy  [facility] for tasks assessed/performed       Past Medical History:  Diagnosis Date  . Allergy   . Arthritis    right knee  . Asthma   . Constipation   . Depression   . GERD (gastroesophageal reflux disease)   . History of kidney stones   . Panic disorder   . Pneumonia   . Renal disorder   . Seasonal allergies   . SVD (spontaneous vaginal delivery)    x 3    Past Surgical History:  Procedure Laterality Date  . ABDOMINAL HYSTERECTOMY    . adiana    . BILATERAL SALPINGECTOMY Bilateral 05/31/2013   Procedure: BILATERAL SALPINGECTOMY;  Surgeon: Cheri Fowler, MD;  Location: [facility];  Service: Gynecology;  Laterality: Bilateral;  . BREAST CYST ASPIRATION Right 2015  . COLONOSCOPY    . DILATION AND CURETTAGE OF UTERUS    . DNC    . KIDNEY STONE SURGERY    . KNEE ARTHROSCOPY Right   . KNEE ARTHROSCOPY WITH DRILLING/MICROFRACTURE Right 07/26/2014   Procedure: RIGHT KNEE ARTHROSCOPY WITH CHONDROPLASTY, MICROFRACTURE;  Surgeon: Marianna Payment, MD;  Location: [place];  Service: Orthopedics;  Laterality: Right;  . KNEE ARTHROSCOPY WITH MEDIAL MENISECTOMY Right 07/26/2014   Procedure: KNEE ARTHROSCOPY WITH  MEDIAL MENISECTOMY;  Surgeon: Marianna Payment, MD;  Location: [facility];  Service: Orthopedics;  Laterality: Right;  . LAPAROSCOPIC ASSISTED VAGINAL HYSTERECTOMY N/A 05/31/2013   Procedure: LAPAROSCOPIC ASSISTED VAGINAL HYSTERECTOMY;  Surgeon: Cheri Fowler, MD;  Location: [facility];  Service: Gynecology;  Laterality: N/A;  . LAPAROSCOPY N/A 08/26/2017   Procedure: LAPAROSCOPY OPERATIVE WITH PERIOTONEAL BIOPSY AND FULGERATION OF ENDOMETRIOSIS;  Surgeon: Cheri Fowler, MD;  Location: [facility];  Service: Gynecology;  Laterality: N/A;  . LUMBAR LAMINECTOMY/DECOMPRESSION MICRODISCECTOMY Right 12/31/2018   Procedure: MICRODISCECTOMY LUMBAR 5- SACRAL1;  Surgeon: Consuella Lose, MD;  Location: [facility];  Service: Neurosurgery;  Laterality: Right;  MICRODISCECTOMY LUMBAR 5- SACRAL1  . PARTIAL HYSTERECTOMY    . TONSILLECTOMY    . TONSILLECTOMY AND ADENOIDECTOMY    . TOTAL KNEE ARTHROPLASTY Right 08/08/2019  . TOTAL KNEE ARTHROPLASTY Right 08/08/2019   Procedure: RIGHT TOTAL KNEE ARTHROPLASTY;  Surgeon: Leandrew Koyanagi, MD;  Location: [facility];  Service: Orthopedics;  Laterality: Right;  . TUBAL LIGATION    . UPPER GI ENDOSCOPY     ulcer    There were no vitals filed for this visit.  Subjective Assessment - 10/04/19 1403    Subjective  Drove to [place] to visit family last week and did some of the driving.  My knee  did pretty well traveling and visiting parks.  My back is bothering me from my past surgery so I'm seeing the doctor next week.    Pertinent History  lumbar surgery    Limitations  Walking;Standing;Sitting    How long can you sit comfortably?  1 hour    How long can you stand comfortably?  20    How long can you walk comfortably?  30-35 min with cane    Patient Stated Goals  sleep is better for the Rt knee, back limits sleep more, drive, run errands, standing for cooking    Currently in Pain?  No/denies    Pain Type  Surgical pain    Pain Onset  More than a month ago    Pain  Frequency  Intermittent    Effect of Pain on Daily Activities  doing some driving now         [facility] PT Assessment - 10/04/19 0001      AROM   Right Knee Extension  -8    Right Knee Flexion  110                   [facility] Adult PT Treatment/Exercise - 10/04/19 0001      Exercises   Exercises  Knee/Hip;Ankle      Knee/Hip Exercises: Stretches   Active Hamstring Stretch  Right;1 rep;30 seconds    Active Hamstring Stretch Limitations  foot on second step    Gastroc Stretch  Right;2 reps;30 seconds    Other Knee/Hip Stretches  Rt knee flexion x 10 reps foot on 2nd step       Knee/Hip Exercises: Aerobic   Stationary Bike  L2 x 8', PT present to review HEP and functional status      Knee/Hip Exercises: Standing   Hip Abduction  Stengthening;Right;2 sets;Knee straight;10 reps    Hip Extension  Stengthening;Right;2 sets;10 reps;Knee straight    Forward Step Up  Right;Hand Hold: 1;1 set;10 reps;Step Height: 6"      Knee/Hip Exercises: Seated   Long Arc Quad  Strengthening;Right;1 set;10 reps    [employer] Limitations  with ball squeeze, first set on dynadisc for core but d/c'd due to imbalance    Abduction/Adduction   Strengthening    Abd/Adduction Limitations  clam green band seated 2 x 10    Sit to [employer]  2 sets;5 reps;with UE support   hands on thighs, green hip abd band around knees     Knee/Hip Exercises: Supine   Straight Leg Raises  Strengthening;Right;AROM;2 sets;10 reps      Cryotherapy   Number Minutes Cryotherapy  10 Minutes    Cryotherapy Location  Knee   Rt   Type of Cryotherapy  Ice pack               PT Short Term Goals - 09/20/19 1612      PT SHORT TERM GOAL #1   Title  Pt will report improved pain by at least 20%    Status  Achieved      PT SHORT TERM GOAL #2   Title  Pt will achieve improved Rt knee edema to </= 1.5cm difference at mid-patella compared to Lt knee.    Status  On-going      PT SHORT TERM GOAL #3   Title  Pt will  be ind in initial HEP including scar massage and edema management and report daily compliance.    Status  Achieved  PT SHORT TERM GOAL #4   Title  Pt will achieve knee flexion to 95 deg or more for improved sit to stand and stand to sit transfers.    Status  Achieved        PT Long Term Goals - 10/04/19 1407      PT LONG TERM GOAL #1   Title  Pt will be ind with advanced HEP and understand how to safely progress.    Baseline  compliant with initial HEP, progressing as tol for strength, flexibility and ROM    Status  On-going      PT LONG TERM GOAL #2   Title  Pt will achieve Rt knee flexion of at least 125 deg and extension to 0 degrees for functional tasks such as gait, alternating LE stair pattern, and sit to stand transfers.    Status  On-going      PT LONG TERM GOAL #3   Title  Pt will be able to tolerate standing tasks >/= 45 min to enable household tasks such as cooking and cleaning.    Baseline  20 min    Status  On-going      PT LONG TERM GOAL #4   Title  Pt will be able to demo symmetrical gait pattern without use of AD for unlimited return to running errands and daily tasks independently with optimal Rt LE function.    Baseline  transtioned to straight cane for shorter duration errands, uses walker if longer than 20 min    Status  On-going      PT LONG TERM GOAL #5   Title  Pt will achieve 5/5 strength surrounding Rt knee to improve daily task performance including ascending/descending 3 flights of stairs to get to apartment.    Status  On-going            Plan - 10/04/19 1439    Clinical Impression Statement  Pt with good control of Rt knee pain and edema.  She is experiencing increaesed LBP and nerve pain related to lumbar surgery last year for which she will see her MD next week.  She was able to tolerate all ther ex today including updates for new HEP ther ex which PT progressed today.  Pt is able to perform alternating pattern for both ascending and  descending stairs using cane with symmetrical WB in bil LEs.  Gait is nearly symmetrical with use of cane.  Pt is able to perform sit to stand with symmetry with hands on thighs.  Circumferential mid-patellar measurement was 36cm bil indicating much improved edema from previous post-op visits.  End range is stiff with Rt knee measuring -8 to 110 today.  Pt will continue to benefit from skilled PT along POC.    Comorbidities  Hx of lumbar lami summer 2020, has some ongoing lumbar pain, had Lt LE weakness prior to lumbar surgery    PT Frequency  2x / week    PT Duration  8 weeks    PT Treatment/Interventions  ADLs/Self Care Home Management;Aquatic Therapy;Electrical Stimulation;Cryotherapy;Moist Heat;Neuromuscular re-education;Balance training;Therapeutic exercise;Therapeutic activities;Functional mobility training;Stair training;Gait training;Patient/family education;Manual techniques;Scar mobilization;Passive range of motion;Dry needling;Taping;Joint Manipulations    PT Next Visit Plan  f/u on updated HEP, leg press, SLR, end range Rt knee mobs/stretching/P/ROM    PT Home Exercise Plan  Access Code: [email or web address], [email or web address]    Consulted and Agree with Plan of Care  Patient       Patient will benefit from skilled therapeutic intervention in  order to improve the following deficits and impairments:     Visit Diagnosis: Acute pain of right knee  Muscle weakness (generalized)     Problem List Patient Active Problem List   Diagnosis Date Noted  . Status post total right knee replacement 08/08/2019  . Pelvic pain in female 08/26/2017  . Pelvic peritoneal adhesions, female 08/26/2017  . Endometriosis of pelvis 08/26/2017  . Unilateral primary osteoarthritis, right knee 06/09/2017  . Unilateral primary osteoarthritis, left knee 06/09/2017  . Family history of breast cancer in mother 12/06/2015  . Menorrhagia 05/31/2013  . Dysmenorrhea 05/31/2013  . Dyspareunia 05/31/2013  . Asthma 08/05/2011    Baruch Merl, PT 10/04/19 2:44 PM   [facility] [address] [place], [place], [address] Phone: [phone]   Fax:  [phone]  Name: [name] [name] MRN: [id number] Date of Birth: 1981-05-20

## 2019-10-06 ENCOUNTER — Encounter: Payer: Self-pay | Admitting: Physical Therapy

## 2019-10-06 ENCOUNTER — Ambulatory Visit: Payer: Medicaid Other | Attending: Physician Assistant | Admitting: Physical Therapy

## 2019-10-06 ENCOUNTER — Other Ambulatory Visit: Payer: Self-pay

## 2019-10-06 DIAGNOSIS — M25561 Pain in right knee: Secondary | ICD-10-CM | POA: Insufficient documentation

## 2019-10-06 DIAGNOSIS — M545 Low back pain: Secondary | ICD-10-CM | POA: Insufficient documentation

## 2019-10-06 DIAGNOSIS — G8929 Other chronic pain: Secondary | ICD-10-CM | POA: Insufficient documentation

## 2019-10-06 DIAGNOSIS — M6281 Muscle weakness (generalized): Secondary | ICD-10-CM | POA: Diagnosis present

## 2019-10-06 NOTE — Therapy (Signed)
[facility] [address] [place], [place], [address] Phone: [phone]   Fax:  [phone]  Physical Therapy Treatment  Patient Details  Name: [name] [name] MRN: [id number] Date of Birth: 1981-01-09 Referring Provider (PT): Aundra Dubin, [place]   Encounter Date: 10/06/2019  PT End of Session - 10/06/19 1406    Visit Number  10    Date for PT Re-Evaluation  10/21/19    Authorization Type  Medicaid    Authorization Time Period  09/16/19-10/27/19    Authorization - Visit Number  5    Authorization - Number of Visits  12    PT Start Time  1400    Activity Tolerance  Patient tolerated treatment well    Behavior During Therapy  [facility] for tasks assessed/performed       Past Medical History:  Diagnosis Date  . Allergy   . Arthritis    right knee  . Asthma   . Constipation   . Depression   . GERD (gastroesophageal reflux disease)   . History of kidney stones   . Panic disorder   . Pneumonia   . Renal disorder   . Seasonal allergies   . SVD (spontaneous vaginal delivery)    x 3    Past Surgical History:  Procedure Laterality Date  . ABDOMINAL HYSTERECTOMY    . adiana    . BILATERAL SALPINGECTOMY Bilateral 05/31/2013   Procedure: BILATERAL SALPINGECTOMY;  Surgeon: Cheri Fowler, MD;  Location: [place] [facility];  Service: Gynecology;  Laterality: Bilateral;  . BREAST CYST ASPIRATION Right 2015  . COLONOSCOPY    . DILATION AND CURETTAGE OF UTERUS    . DNC    . KIDNEY STONE SURGERY    . KNEE ARTHROSCOPY Right   . KNEE ARTHROSCOPY WITH DRILLING/MICROFRACTURE Right 07/26/2014   Procedure: RIGHT KNEE ARTHROSCOPY WITH CHONDROPLASTY, MICROFRACTURE;  Surgeon: Marianna Payment, MD;  Location: [place];  Service: Orthopedics;  Laterality: Right;  . KNEE ARTHROSCOPY WITH MEDIAL MENISECTOMY Right 07/26/2014   Procedure: KNEE ARTHROSCOPY WITH MEDIAL MENISECTOMY;  Surgeon: Marianna Payment, MD;  Location:  [place];  Service: Orthopedics;  Laterality: Right;  . LAPAROSCOPIC ASSISTED VAGINAL HYSTERECTOMY N/A 05/31/2013   Procedure: LAPAROSCOPIC ASSISTED VAGINAL HYSTERECTOMY;  Surgeon: Cheri Fowler, MD;  Location: [facility];  Service: Gynecology;  Laterality: N/A;  . LAPAROSCOPY N/A 08/26/2017   Procedure: LAPAROSCOPY OPERATIVE WITH PERIOTONEAL BIOPSY AND FULGERATION OF ENDOMETRIOSIS;  Surgeon: Cheri Fowler, MD;  Location: [facility];  Service: Gynecology;  Laterality: N/A;  . LUMBAR LAMINECTOMY/DECOMPRESSION MICRODISCECTOMY Right 12/31/2018   Procedure: MICRODISCECTOMY LUMBAR 5- SACRAL1;  Surgeon: Consuella Lose, MD;  Location: [place];  Service: Neurosurgery;  Laterality: Right;  MICRODISCECTOMY LUMBAR 5- SACRAL1  . PARTIAL HYSTERECTOMY    . TONSILLECTOMY    . TONSILLECTOMY AND ADENOIDECTOMY    . TOTAL KNEE ARTHROPLASTY Right 08/08/2019  . TOTAL KNEE ARTHROPLASTY Right 08/08/2019   Procedure: RIGHT TOTAL KNEE ARTHROPLASTY;  Surgeon: Leandrew Koyanagi, MD;  Location: [place];  Service: Orthopedics;  Laterality: Right;  . TUBAL LIGATION    . UPPER GI ENDOSCOPY     ulcer    There were no vitals filed for this visit.  Subjective Assessment - 10/06/19 1403    Subjective  Knee is feeling pretty good.  I shopped for 45 min yesterday which did increase my pain but felt better once I got off my feet.    Pertinent History  lumbar surgery  Limitations  Walking;Standing;Sitting    How long can you sit comfortably?  1 hour    How long can you stand comfortably?  20    How long can you walk comfortably?  30-35 min with cane    Patient Stated Goals  sleep is better for the Rt knee, back limits sleep more, drive, run errands, standing for cooking    Currently in Pain?  No/denies    Pain Onset  More than a month ago    Aggravating Factors   standing/walking > 30-35 min    Pain Relieving Factors  ice, meds, stretching                       [facility] Adult PT Treatment/Exercise -  10/06/19 0001      Exercises   Exercises  Knee/Hip      Knee/Hip Exercises: Stretches   Active Hamstring Stretch  Right;2 reps;30 seconds    Active Hamstring Stretch Limitations  seated    Gastroc Stretch  Right;2 reps;30 seconds    Gastroc Stretch Limitations  slant board    Other Knee/Hip Stretches  Rt knee flexion ROM foot on 2nd step 5x5 sec      Knee/Hip Exercises: Aerobic   Stationary Bike  L2 x 8', PT present to discuss updated HEP and activity level and pain       Knee/Hip Exercises: Machines for Strengthening   Cybex Leg Press  55# bil LEs 2x10, single leg Rt only 30# 2x5      Knee/Hip Exercises: Standing   Knee Flexion  Strengthening;Right;2 sets;10 reps    Knee Flexion Limitations  2#    Hip Flexion  Stengthening;Right;1 set;10 reps;Knee straight    Hip Flexion Limitations  2#    Hip Abduction  Stengthening;Right;Knee straight;1 set;10 reps    Hip Extension  Stengthening;Right;1 set;10 reps;Knee straight      Cryotherapy   Number Minutes Cryotherapy  10 Minutes    Cryotherapy Location  Knee   Rt   Type of Cryotherapy  Ice pack      Manual Therapy   Manual Therapy  Joint mobilization    Joint Mobilization  patellar mobs 4-way Gr II/III    Soft tissue mobilization  anterior knee, longitudinal scar mobs    Passive ROM  knee flexion on Rt               PT Short Term Goals - 09/20/19 1612      PT SHORT TERM GOAL #1   Title  Pt will report improved pain by at least 20%    Status  Achieved      PT SHORT TERM GOAL #2   Title  Pt will achieve improved Rt knee edema to </= 1.5cm difference at mid-patella compared to Lt knee.    Status  On-going      PT SHORT TERM GOAL #3   Title  Pt will be ind in initial HEP including scar massage and edema management and report daily compliance.    Status  Achieved      PT SHORT TERM GOAL #4   Title  Pt will achieve knee flexion to 95 deg or more for improved sit to stand and stand to sit transfers.    Status   Achieved        PT Long Term Goals - 10/04/19 1407      PT LONG TERM GOAL #1   Title  Pt will be ind with advanced  HEP and understand how to safely progress.    Baseline  compliant with initial HEP, progressing as tol for strength, flexibility and ROM    Status  On-going      PT LONG TERM GOAL #2   Title  Pt will achieve Rt knee flexion of at least 125 deg and extension to 0 degrees for functional tasks such as gait, alternating LE stair pattern, and sit to stand transfers.    Status  On-going      PT LONG TERM GOAL #3   Title  Pt will be able to tolerate standing tasks >/= 45 min to enable household tasks such as cooking and cleaning.    Baseline  20 min    Status  On-going      PT LONG TERM GOAL #4   Title  Pt will be able to demo symmetrical gait pattern without use of AD for unlimited return to running errands and daily tasks independently with optimal Rt LE function.    Baseline  transtioned to straight cane for shorter duration errands, uses walker if longer than 20 min    Status  On-going      PT LONG TERM GOAL #5   Title  Pt will achieve 5/5 strength surrounding Rt knee to improve daily task performance including ascending/descending 3 flights of stairs to get to apartment.    Status  On-going            Plan - 10/06/19 1441    Clinical Impression Statement  Pt with improved knee flexion with manual techniques today.  Achieved 114 deg flexion.  She demos improving quad control in concentric and eccentric phases of ther ex.  PT worked on improving longitudinal scar mobilizaiton for knee flexion and patellar mobs in all directions.  Pt tolerated single Rt leg press today but noted fatigue after 2x5.  She has good pain and edema control.  She is tolerating community activity for 30-45 min at a time.  She is doing some walking without AD.  She was able to descend stairs with alt pattern without use of cane today.  Pt will continue to benefit from skilled PT along current  POC.    Comorbidities  Hx of lumbar lami summer 2020, has some ongoing lumbar pain, had Lt LE weakness prior to lumbar surgery    PT Frequency  2x / week    PT Duration  8 weeks    PT Treatment/Interventions  ADLs/Self Care Home Management;Aquatic Therapy;Electrical Stimulation;Cryotherapy;Moist Heat;Neuromuscular re-education;Balance training;Therapeutic exercise;Therapeutic activities;Functional mobility training;Stair training;Gait training;Patient/family education;Manual techniques;Scar mobilization;Passive range of motion;Dry needling;Taping;Joint Manipulations    PT Next Visit Plan  continue patellar mobs, P/ROM end range with scar mobs, knee and hip strength, gait training    PT Home Exercise Plan  Access Code: [email or web address], scar [email or web address]    Consulted and Agree with Plan of Care  Patient       Patient will benefit from skilled therapeutic intervention in order to improve the following deficits and impairments:     Visit Diagnosis: Acute pain of right knee  Muscle weakness (generalized)     Problem List Patient Active Problem List   Diagnosis Date Noted  . Status post total right knee replacement 08/08/2019  . Pelvic pain in female 08/26/2017  . Pelvic peritoneal adhesions, female 08/26/2017  . Endometriosis of pelvis 08/26/2017  . Unilateral primary osteoarthritis, right knee 06/09/2017  . Unilateral primary osteoarthritis, left knee 06/09/2017  . Family history of breast cancer in mother 12/06/2015  .  Menorrhagia 05/31/2013  . Dysmenorrhea 05/31/2013  . Dyspareunia 05/31/2013  . Asthma 08/05/2011    Baruch Merl, PT 10/06/19 2:45 PM   [facility] [address] [place], [place], [address] Phone: [phone]   Fax:  [phone]  Name: [name] [name] MRN: [id number] Date of Birth: [birth year]

## 2019-10-11 ENCOUNTER — Other Ambulatory Visit: Payer: Self-pay

## 2019-10-11 ENCOUNTER — Ambulatory Visit: Payer: Medicaid Other | Admitting: Physical Therapy

## 2019-10-11 ENCOUNTER — Encounter: Payer: Self-pay | Admitting: Physical Therapy

## 2019-10-11 DIAGNOSIS — M25561 Pain in right knee: Secondary | ICD-10-CM | POA: Diagnosis not present

## 2019-10-11 DIAGNOSIS — G8929 Other chronic pain: Secondary | ICD-10-CM

## 2019-10-11 DIAGNOSIS — M545 Low back pain, unspecified: Secondary | ICD-10-CM

## 2019-10-11 DIAGNOSIS — M6281 Muscle weakness (generalized): Secondary | ICD-10-CM
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
## 2019-10-13 ENCOUNTER — Other Ambulatory Visit: Payer: Self-pay

## 2019-10-13 ENCOUNTER — Ambulatory Visit: Payer: Medicaid Other | Admitting: Physical Therapy

## 2019-10-13 ENCOUNTER — Encounter: Payer: Self-pay | Admitting: Physical Therapy

## 2019-10-13 ENCOUNTER — Encounter: Payer: Medicaid Other | Admitting: Physical Therapy

## 2019-10-13 DIAGNOSIS — M6281 Muscle weakness (generalized): Secondary | ICD-10-CM

## 2019-10-13 DIAGNOSIS — M25561 Pain in right knee: Secondary | ICD-10-CM

## 2019-10-13 NOTE — Therapy (Signed)
[facility] [address] [place], [place], [address] Phone: [phone]   Fax:  [phone]  Physical Therapy Treatment  Patient Details  Name: [name] [name] MRN: [id number] Date of Birth: [birth year] Referring Provider (PT): Aundra Dubin, [place]   Encounter Date: 10/13/2019  PT End of Session - 10/13/19 0931    Visit Number  12    Date for PT Re-Evaluation  10/21/19    Authorization Type  Medicaid    Authorization Time Period  09/16/19-10/27/19    Authorization - Visit Number  7    Authorization - Number of Visits  12    PT Start Time  0932    PT Stop Time  1021    PT Time Calculation (min)  49 min    Activity Tolerance  Patient tolerated treatment well    Behavior During Therapy  [facility] for tasks assessed/performed       Past Medical History:  Diagnosis Date  . Allergy   . Arthritis    right knee  . Asthma   . Constipation   . Depression   . GERD (gastroesophageal reflux disease)   . History of kidney stones   . Panic disorder   . Pneumonia   . Renal disorder   . Seasonal allergies   . SVD (spontaneous vaginal delivery)    x 3    Past Surgical History:  Procedure Laterality Date  . ABDOMINAL HYSTERECTOMY    . adiana    . BILATERAL SALPINGECTOMY Bilateral 05/31/2013   Procedure: BILATERAL SALPINGECTOMY;  Surgeon: Cheri Fowler, MD;  Location: [place] [facility];  Service: Gynecology;  Laterality: Bilateral;  . BREAST CYST ASPIRATION Right 2015  . COLONOSCOPY    . DILATION AND CURETTAGE OF UTERUS    . DNC    . KIDNEY STONE SURGERY    . KNEE ARTHROSCOPY Right   . KNEE ARTHROSCOPY WITH DRILLING/MICROFRACTURE Right 07/26/2014   Procedure: RIGHT KNEE ARTHROSCOPY WITH CHONDROPLASTY, MICROFRACTURE;  Surgeon: Marianna Payment, MD;  Location: [place];  Service: Orthopedics;  Laterality: Right;  . KNEE ARTHROSCOPY WITH MEDIAL MENISECTOMY Right 07/26/2014   Procedure: KNEE ARTHROSCOPY WITH  MEDIAL MENISECTOMY;  Surgeon: Marianna Payment, MD;  Location: [place];  Service: Orthopedics;  Laterality: Right;  . LAPAROSCOPIC ASSISTED VAGINAL HYSTERECTOMY N/A 05/31/2013   Procedure: LAPAROSCOPIC ASSISTED VAGINAL HYSTERECTOMY;  Surgeon: Cheri Fowler, MD;  Location: [place] [facility];  Service: Gynecology;  Laterality: N/A;  . LAPAROSCOPY N/A 08/26/2017   Procedure: LAPAROSCOPY OPERATIVE WITH PERIOTONEAL BIOPSY AND FULGERATION OF ENDOMETRIOSIS;  Surgeon: Cheri Fowler, MD;  Location: [facility] [place];  Service: Gynecology;  Laterality: N/A;  . LUMBAR LAMINECTOMY/DECOMPRESSION MICRODISCECTOMY Right 12/31/2018   Procedure: MICRODISCECTOMY LUMBAR 5- SACRAL1;  Surgeon: Consuella Lose, MD;  Location: [place];  Service: Neurosurgery;  Laterality: Right;  MICRODISCECTOMY LUMBAR 5- SACRAL1  . PARTIAL HYSTERECTOMY    . TONSILLECTOMY    . TONSILLECTOMY AND ADENOIDECTOMY    . TOTAL KNEE ARTHROPLASTY Right 08/08/2019  . TOTAL KNEE ARTHROPLASTY Right 08/08/2019   Procedure: RIGHT TOTAL KNEE ARTHROPLASTY;  Surgeon: Leandrew Koyanagi, MD;  Location: [place];  Service: Orthopedics;  Laterality: Right;  . TUBAL LIGATION    . UPPER GI ENDOSCOPY     ulcer    There were no vitals filed for this visit.  Subjective Assessment - 10/13/19 0932    Subjective  Patient reports some stiffness in knee today. Patient still limited with how long she can walk. She  can walk throught [place] but needs the buggy.    Patient Stated Goals  sleep is better for the Rt knee, drive, run errands, standing for cooking    Currently in Pain?  No/denies                       [facility] Adult PT Treatment/Exercise - 10/13/19 0001      Knee/Hip Exercises: Aerobic   Recumbent Bike  L1-2 x 6 min   seat close for flexion stretch     Knee/Hip Exercises: Machines for Strengthening   Cybex Knee Extension  10# 2x10; eccentric lowering too difficult    Cybex Knee Flexion  10 # 2x10 RLE with assist of left for last 30 deg     Cybex Leg Press  55# bil LEs x 25, single leg Rt only 30# 2x10      Knee/Hip Exercises: Standing   Terminal Knee Extension  Strengthening;Right;10 reps;Theraband    Theraband Level (Terminal Knee Extension)  Level 2 (Red)    Terminal Knee Extension Limitations  hold 5 sec, PT cued weight shift into Rt LE    Step Down  Left;2 sets;5 sets;Hand Hold: 2;Step Height: 6"    Step Down Limitations  heel tap left    SLS  2x10 sec    SLS with Vectors  on RLE x 10     Other Standing Knee Exercises  semi-tandem stance with head turns x 5 ea way      Knee/Hip Exercises: Seated   Long Arc Quad  Right;2 sets;10 reps;Weights    Long Arc Quad Weight  6 lbs.      Knee/Hip Exercises: Supine   Short Arc Quad Sets  2 sets;10 reps    Short Arc Quad Sets Limitations  6# (increase to 8#)      Knee/Hip Exercises: Prone   Hamstring Curl  2 sets;10 reps    Hamstring Curl Limitations  6#                PT Short Term Goals - 10/11/19 1407      PT SHORT TERM GOAL #1   Title  Pt will report improved pain by at least 20%    Status  Achieved      PT SHORT TERM GOAL #2   Title  Pt will achieve improved Rt knee edema to </= 1.5cm difference at mid-patella compared to Lt knee.    Status  On-going      PT SHORT TERM GOAL #3   Title  Pt will be ind in initial HEP including scar massage and edema management and report daily compliance.    Status  Achieved      PT SHORT TERM GOAL #4   Title  Pt will achieve knee flexion to 95 deg or more for improved sit to stand and stand to sit transfers.    Status  Achieved        PT Long Term Goals - 10/11/19 1408      PT LONG TERM GOAL #1   Title  Pt will be ind with advanced HEP and understand how to safely progress.    Baseline  compliant with current level of HEP, progressing ther ex    Status  On-going      PT LONG TERM GOAL #2   Title  Pt will achieve Rt knee flexion of at least 125 deg and extension to 0 degrees for functional tasks such as gait,  alternating LE  stair pattern, and sit to stand transfers.    Baseline  -10 to 95 A/ROM on Rt knee    Status  On-going      PT LONG TERM GOAL #3   Title  Pt will be able to tolerate standing tasks >/= 45 min to enable household tasks such as cooking and cleaning.    Baseline  20 min at a time    Status  On-going      PT LONG TERM GOAL #4   Title  Pt will be able to demo symmetrical gait pattern without use of AD for unlimited return to running errands and daily tasks independently with optimal Rt LE function.    Baseline  transtioned to straight cane for shorter duration errand    Status  On-going      PT LONG TERM GOAL #5   Title  Pt will achieve 5/5 strength surrounding Rt knee to improve daily task performance including ascending/descending 3 flights of stairs to get to apartment.    Status  On-going            Plan - 10/13/19 1013    Clinical Impression Statement  Patient did well with increased resistance today. She has eccentric weakness with heel taps and weakness with end range extension. Patient enjoyed increased resistance.    Comorbidities  Hx of lumbar lami summer 2020, has some ongoing lumbar pain, had Lt LE weakness prior to lumbar surgery    PT Frequency  2x / week    PT Duration  8 weeks    PT Treatment/Interventions  ADLs/Self Care Home Management;Aquatic Therapy;Electrical Stimulation;Cryotherapy;Moist Heat;Neuromuscular re-education;Balance training;Therapeutic exercise;Therapeutic activities;Functional mobility training;Stair training;Gait training;Patient/family education;Manual techniques;Scar mobilization;Passive range of motion;Dry needling;Taping;Joint Manipulations    PT Next Visit Plan  continue patellar mobs, P/ROM end range with scar mobs, knee and hip strength, gait training       Patient will benefit from skilled therapeutic intervention in order to improve the following deficits and impairments:  Abnormal gait, Decreased range of motion, Difficulty  walking, Decreased activity tolerance, Pain, Decreased balance, Decreased scar mobility, Hypomobility, Impaired flexibility, Improper body mechanics, Decreased mobility, Decreased strength, Increased edema, Postural dysfunction  Visit Diagnosis: Acute pain of right knee  Muscle weakness (generalized)     Problem List Patient Active Problem List   Diagnosis Date Noted  . Status post total right knee replacement 08/08/2019  . Pelvic pain in female 08/26/2017  . Pelvic peritoneal adhesions, female 08/26/2017  . Endometriosis of pelvis 08/26/2017  . Unilateral primary osteoarthritis, right knee 06/09/2017  . Unilateral primary osteoarthritis, left knee 06/09/2017  . Family history of breast cancer in mother 12/06/2015  . Menorrhagia 05/31/2013  . Dysmenorrhea 05/31/2013  . Dyspareunia 05/31/2013  . Asthma 08/05/2011   Madelyn Flavors PT 10/13/2019, 5:31 PM  [facility] [address] [place], [place], [address] Phone: [phone]   Fax:  [phone]  Name: [name] [name] MRN: [id number] Date of Birth: 1980-08-06

## 2019-10-14 ENCOUNTER — Other Ambulatory Visit: Payer: Self-pay | Admitting: Physician Assistant

## 2019-10-14 DIAGNOSIS — Z1231 Encounter for screening mammogram for malignant neoplasm of breast: Secondary | ICD-10-CM

## 2019-10-18 ENCOUNTER — Ambulatory Visit: Payer: Medicaid Other | Admitting: Physical Therapy

## 2019-10-18 ENCOUNTER — Telehealth: Payer: Self-pay | Admitting: Physical Therapy

## 2019-10-18 NOTE — Telephone Encounter (Signed)
Spoke to Pt about missed physical therapy appointment. She had to go out of town for family emergency and meant to call to cancel this week.  She confirmed she will be back next week. Finch Costanzo, PT 10/18/19 2:23 PM

## 2019-10-20 ENCOUNTER — Ambulatory Visit: Payer: Medicaid Other | Admitting: Physical Therapy

## 2019-10-21 ENCOUNTER — Ambulatory Visit: Payer: Medicaid Other

## 2019-10-25 ENCOUNTER — Ambulatory Visit: Payer: Medicaid Other | Admitting: Physical Therapy

## 2019-10-25 ENCOUNTER — Encounter: Payer: Self-pay | Admitting: Physical Therapy

## 2019-10-25 ENCOUNTER — Other Ambulatory Visit: Payer: Self-pay

## 2019-10-25 DIAGNOSIS — M25561 Pain in right knee: Secondary | ICD-10-CM

## 2019-10-25 DIAGNOSIS — M6281 Muscle weakness (generalized): Secondary | ICD-10-CM

## 2019-10-25 NOTE — Therapy (Signed)
[facility] [address], [place], [place], [address] Phone: [phone]   Fax:  [phone]  Physical Therapy Treatment  Patient Details  Name: Notie Wildt MRN: [id number] Date of Birth: October 28, 1980 Referring Provider (PT): Aundra Dubin, [place]   Encounter Date: 10/25/2019  PT End of Session - 10/25/19 1355    Visit Number  13    Date for PT Re-Evaluation  12/20/19    Authorization Type  Medicaid    Authorization Time Period  09/16/19-10/27/19    Authorization - Visit Number  8    Authorization - Number of Visits  12    PT Start Time  [id number]    PT Stop Time  1447    PT Time Calculation (min)  52 min    Activity Tolerance  Patient tolerated treatment well    Behavior During Therapy  [facility] for tasks assessed/performed       Past Medical History:  Diagnosis Date  . Allergy   . Arthritis    right knee  . Asthma   . Constipation   . Depression   . GERD (gastroesophageal reflux disease)   . History of kidney stones   . Panic disorder   . Pneumonia   . Renal disorder   . Seasonal allergies   . SVD (spontaneous vaginal delivery)    x 3    Past Surgical History:  Procedure Laterality Date  . ABDOMINAL HYSTERECTOMY    . adiana    . BILATERAL SALPINGECTOMY Bilateral 05/31/2013   Procedure: BILATERAL SALPINGECTOMY;  Surgeon: Cheri Fowler, MD;  Location: [place] [facility];  Service: Gynecology;  Laterality: Bilateral;  . BREAST CYST ASPIRATION Right 2015  . COLONOSCOPY    . DILATION AND CURETTAGE OF UTERUS    . DNC    . KIDNEY STONE SURGERY    . KNEE ARTHROSCOPY Right   . KNEE ARTHROSCOPY WITH DRILLING/MICROFRACTURE Right 07/26/2014   Procedure: RIGHT KNEE ARTHROSCOPY WITH CHONDROPLASTY, MICROFRACTURE;  Surgeon: Marianna Payment, MD;  Location: [place];  Service: Orthopedics;  Laterality: Right;  . KNEE ARTHROSCOPY WITH MEDIAL MENISECTOMY Right 07/26/2014   Procedure: KNEE ARTHROSCOPY WITH  MEDIAL MENISECTOMY;  Surgeon: Marianna Payment, MD;  Location: [place];  Service: Orthopedics;  Laterality: Right;  . LAPAROSCOPIC ASSISTED VAGINAL HYSTERECTOMY N/A 05/31/2013   Procedure: LAPAROSCOPIC ASSISTED VAGINAL HYSTERECTOMY;  Surgeon: Cheri Fowler, MD;  Location: [place] [facility];  Service: Gynecology;  Laterality: N/A;  . LAPAROSCOPY N/A 08/26/2017   Procedure: LAPAROSCOPY OPERATIVE WITH PERIOTONEAL BIOPSY AND FULGERATION OF ENDOMETRIOSIS;  Surgeon: Cheri Fowler, MD;  Location: [facility] [facility];  Service: Gynecology;  Laterality: N/A;  . LUMBAR LAMINECTOMY/DECOMPRESSION MICRODISCECTOMY Right 12/31/2018   Procedure: MICRODISCECTOMY LUMBAR 5- SACRAL1;  Surgeon: Consuella Lose, MD;  Location: [place];  Service: Neurosurgery;  Laterality: Right;  MICRODISCECTOMY LUMBAR 5- SACRAL1  . PARTIAL HYSTERECTOMY    . TONSILLECTOMY    . TONSILLECTOMY AND ADENOIDECTOMY    . TOTAL KNEE ARTHROPLASTY Right 08/08/2019  . TOTAL KNEE ARTHROPLASTY Right 08/08/2019   Procedure: RIGHT TOTAL KNEE ARTHROPLASTY;  Surgeon: Leandrew Koyanagi, MD;  Location: [place];  Service: Orthopedics;  Laterality: Right;  . TUBAL LIGATION    . UPPER GI ENDOSCOPY     ulcer    There were no vitals filed for this visit.  Subjective Assessment - 10/25/19 1358    Subjective  Pt reports ongoing swelling when walking a lot.  Not using cane anymore.  Goes down stairs  alternating pattern but slow due to Rt knee stiffness.  Tolerates errands x 45 min a time now but with increased swelling.    Pertinent History  lumbar surgery    Limitations  Walking;Standing;Sitting    How long can you sit comfortably?  1 hour    How long can you stand comfortably?  20 (limited by back pain)    How long can you walk comfortably?  30-45' without cane    Patient Stated Goals  now driving up to 1 hour, errands x 40-45 min at a time,    Currently in Pain?  No/denies    Pain Score  --   can go up to 3/10   Pain Location  Knee    Pain Orientation   Right    Pain Descriptors / Indicators  Tightness;Throbbing    Pain Type  Surgical pain    Pain Onset  More than a month ago    Pain Frequency  Intermittent    Aggravating Factors   walk > 40-45'    Pain Relieving Factors  ice, meds, stretch    Effect of Pain on Daily Activities  increasing errands and driving time         [facility] PT Assessment - 10/25/19 0001      Assessment   Medical Diagnosis  Z96.651 (ICD-10-CM) - Hx of total knee replacement, right    Referring Provider (PT)  Aundra Dubin, PA-C    Onset Date/Surgical Date  08/08/19    Hand Dominance  Right    Next MD Visit  4 weeks    Prior Therapy  home health      Functional Tests   Functional tests  Step down;Step up      Step Up   Comments  symmetrical, ind      Step Down   Comments  end range eccentric weakness on Rt       Sit to Stand   Comments  symmetrical, ind      ROM / Strength   AROM / PROM / Strength  Strength      AROM   Right Knee Extension  -5    Right Knee Flexion  120      Strength   Right Knee Flexion  4-/5    Right Knee Extension  4/5      Flexibility   Soft Tissue Assessment /Muscle Length  yes    Hamstrings  limited 10%      Ambulation/Gait   Gait Comments  slight asymmetry due to limited end range ext on Rt knee                   [facility] Adult PT Treatment/Exercise - 10/25/19 0001      Knee/Hip Exercises: Machines for Strengthening   Cybex Leg Press  55# bil LEs x 25, single leg Rt only 30# 2x10      Knee/Hip Exercises: Standing   Forward Step Up  Right;10 reps;Step Height: 6";Hand Hold: 2    Forward Step Up Limitations  contralateral high knee march emphasis on Rt knee terminal ext    Step Down  Left;Hand Hold: 2;Step Height: 6";10 reps    Step Down Limitations  heel tap left    Other Standing Knee Exercises  knee flexion 6# 1x10      Knee/Hip Exercises: Supine   Short Arc Quad Sets  Strengthening;Right;10 reps    Short Arc Quad Sets Limitations  6# with slow  eccentric return  Straight Leg Raises  Strengthening;Right;10 reps      Knee/Hip Exercises: Sidelying   Hip ABduction  Strengthening;Right;2 sets;5 reps      Cryotherapy   Number Minutes Cryotherapy  10 Minutes    Cryotherapy Location  Knee    Type of Cryotherapy  Ice pack               PT Short Term Goals - 10/11/19 1407      PT SHORT TERM GOAL #1   Title  Pt will report improved pain by at least 20%    Status  Achieved      PT SHORT TERM GOAL #2   Title  Pt will achieve improved Rt knee edema to </= 1.5cm difference at mid-patella compared to Lt knee.    Status  On-going      PT SHORT TERM GOAL #3   Title  Pt will be ind in initial HEP including scar massage and edema management and report daily compliance.    Status  Achieved      PT SHORT TERM GOAL #4   Title  Pt will achieve knee flexion to 95 deg or more for improved sit to stand and stand to sit transfers.    Status  Achieved        PT Long Term Goals - 10/25/19 1355      PT LONG TERM GOAL #1   Title  Pt will be ind with advanced HEP and understand how to safely progress.    Baseline  building resistance/reps    Time  8    Period  Weeks    Status  On-going    Target Date  12/20/19      PT LONG TERM GOAL #2   Title  Pt will achieve Rt knee flexion of at least 125 deg and extension to 0 degrees for functional tasks such as gait, alternating LE stair pattern, and sit to stand transfers.    Baseline  -5 to 120 deg on Rt    Time  8    Period  Weeks    Status  On-going    Target Date  12/20/19      PT LONG TERM GOAL #3   Title  Pt will be able to tolerate standing tasks >/= 45 min to enable household tasks such as cooking and cleaning.    Baseline  40 min    Time  8    Period  Weeks    Status  On-going    Target Date  12/20/19      PT LONG TERM GOAL #4   Title  Pt will be able to demo symmetrical gait pattern without use of AD for unlimited return to running errands and daily tasks independently  with optimal Rt LE function.    Baseline  running errands without cane, slight asymmetry still present due to limited end range extension    Time  8    Period  Weeks    Status  On-going    Target Date  12/20/19      PT LONG TERM GOAL #5   Title  Pt will achieve 5/5 strength surrounding Rt knee to improve daily task performance including ascending/descending 3 flights of stairs to get to apartment.    Baseline  Rt knee 4/5 ext, 4-/5 flexion    Time  8    Period  Weeks    Target Date  12/20/19  Plan - 10/25/19 1415    Clinical Impression Statement  Pt is making good progress with Rt knee ROM and strength.  ROM is -5 to 120 in Rt knee (Lt 0-125).  Rt knee MMT for ext is 4/5 and flexion is 4-/5.  She is no longer using a cane unless anticipates walking up to 40-45 min.  She continues to manage edema after being on feet with elevation and ice packs at home.  Gait has slight asymmetry due to limited end range knee ext.  She demos ability to do stairs up/down with alternating pattern with some eccentric weakness with descending stairs.  She is tolerating increased resistance for all ther ex.  She will continue to benefit from skilled PT for ongoing progression of strength, function, endurance and ROM for Rt knee.    Comorbidities  Hx of lumbar lami summer 2020, has some ongoing lumbar pain, had Lt LE weakness prior to lumbar surgery    Examination-Activity Limitations  Locomotion Level;Transfers;Bed Mobility;Sit;Sleep;Squat;Stairs;Stand;Lift    Examination-Participation Restrictions  Community Activity;Driving;Cleaning;Meal Prep;Laundry    Stability/Clinical Decision Making  Stable/Uncomplicated    Clinical Decision Making  Low    Rehab Potential  Excellent    PT Frequency  2x / week    PT Duration  8 weeks    PT Treatment/Interventions  ADLs/Self Care Home Management;Aquatic Therapy;Electrical Stimulation;Cryotherapy;Moist Heat;Neuromuscular re-education;Balance  training;Therapeutic exercise;Therapeutic activities;Functional mobility training;Stair training;Gait training;Patient/family education;Manual techniques;Scar mobilization;Passive range of motion;Dry needling;Taping;Joint Manipulations    PT Next Visit Plan  continue end range extension and flexion ROM, gait training, knee and hip strength, STM/scar massage as needed    PT Home Exercise Plan  Access Code: [email or web address], scar [email or web address]    Consulted and Agree with Plan of Care  Patient       Patient will benefit from skilled therapeutic intervention in order to improve the following deficits and impairments:  Abnormal gait, Decreased range of motion, Difficulty walking, Decreased activity tolerance, Pain, Decreased balance, Decreased scar mobility, Hypomobility, Impaired flexibility, Improper body mechanics, Decreased mobility, Decreased strength, Increased edema, Postural dysfunction  Visit Diagnosis: Acute pain of right knee - Plan: PT plan of care cert/re-cert  Muscle weakness (generalized) - Plan: PT plan of care cert/re-cert     Problem List Patient Active Problem List   Diagnosis Date Noted  . Status post total right knee replacement 08/08/2019  . Pelvic pain in female 08/26/2017  . Pelvic peritoneal adhesions, female 08/26/2017  . Endometriosis of pelvis 08/26/2017  . Unilateral primary osteoarthritis, right knee 06/09/2017  . Unilateral primary osteoarthritis, left knee 06/09/2017  . Family history of breast cancer in mother 12/06/2015  . Menorrhagia 05/31/2013  . Dysmenorrhea 05/31/2013  . Dyspareunia 05/31/2013  . Asthma 08/05/2011    Baruch Merl, PT 10/25/19 2:39 PM   [facility] [address], [place], [place], [address] Phone: [phone]   Fax:  [phone]  Name: Briella Warne MRN: [id number] Date of Birth: 1980-12-20

## 2019-10-27 ENCOUNTER — Encounter: Payer: Self-pay | Admitting: Physical Therapy

## 2019-10-27 ENCOUNTER — Ambulatory Visit: Payer: Medicaid Other | Admitting: Physical Therapy

## 2019-10-27 ENCOUNTER — Other Ambulatory Visit: Payer: Self-pay

## 2019-10-27 DIAGNOSIS — M6281 Muscle weakness (generalized): Secondary | ICD-10-CM

## 2019-10-27 DIAGNOSIS — M25561 Pain in right knee: Secondary | ICD-10-CM | POA: Diagnosis not present

## 2019-10-27 NOTE — Therapy (Signed)
[facility] [address], [place], [place], [address] Phone: [phone]   Fax:  [phone]  Physical Therapy Treatment  Patient Details  Name: [name] [name] MRN: [id number] Date of Birth: [birth year] Referring Provider (PT): Aundra Dubin, [place]   Encounter Date: 10/27/2019  PT End of Session - 10/27/19 1359    Visit Number  14    Date for PT Re-Evaluation  12/20/19    Authorization Type  Medicaid    Authorization Time Period  09/16/19-10/27/19    Authorization - Visit Number  9    Authorization - Number of Visits  12    PT Start Time  [id number]    PT Stop Time  1445    PT Time Calculation (min)  50 min    Activity Tolerance  Patient tolerated treatment well    Behavior During Therapy  [facility] for tasks assessed/performed       Past Medical History:  Diagnosis Date  . Allergy   . Arthritis    right knee  . Asthma   . Constipation   . Depression   . GERD (gastroesophageal reflux disease)   . History of kidney stones   . Panic disorder   . Pneumonia   . Renal disorder   . Seasonal allergies   . SVD (spontaneous vaginal delivery)    x 3    Past Surgical History:  Procedure Laterality Date  . ABDOMINAL HYSTERECTOMY    . adiana    . BILATERAL SALPINGECTOMY Bilateral 05/31/2013   Procedure: BILATERAL SALPINGECTOMY;  Surgeon: Cheri Fowler, MD;  Location: [facility];  Service: Gynecology;  Laterality: Bilateral;  . BREAST CYST ASPIRATION Right 2015  . COLONOSCOPY    . DILATION AND CURETTAGE OF UTERUS    . DNC    . KIDNEY STONE SURGERY    . KNEE ARTHROSCOPY Right   . KNEE ARTHROSCOPY WITH DRILLING/MICROFRACTURE Right 07/26/2014   Procedure: RIGHT KNEE ARTHROSCOPY WITH CHONDROPLASTY, MICROFRACTURE;  Surgeon: Marianna Payment, MD;  Location: [place];  Service: Orthopedics;  Laterality: Right;  . KNEE ARTHROSCOPY WITH MEDIAL MENISECTOMY Right 07/26/2014   Procedure: KNEE ARTHROSCOPY WITH  MEDIAL MENISECTOMY;  Surgeon: Marianna Payment, MD;  Location: [place];  Service: Orthopedics;  Laterality: Right;  . LAPAROSCOPIC ASSISTED VAGINAL HYSTERECTOMY N/A 05/31/2013   Procedure: LAPAROSCOPIC ASSISTED VAGINAL HYSTERECTOMY;  Surgeon: Cheri Fowler, MD;  Location: [facility];  Service: Gynecology;  Laterality: N/A;  . LAPAROSCOPY N/A 08/26/2017   Procedure: LAPAROSCOPY OPERATIVE WITH PERIOTONEAL BIOPSY AND FULGERATION OF ENDOMETRIOSIS;  Surgeon: Cheri Fowler, MD;  Location: [facility];  Service: Gynecology;  Laterality: N/A;  . LUMBAR LAMINECTOMY/DECOMPRESSION MICRODISCECTOMY Right 12/31/2018   Procedure: MICRODISCECTOMY LUMBAR 5- SACRAL1;  Surgeon: Consuella Lose, MD;  Location: [place];  Service: Neurosurgery;  Laterality: Right;  MICRODISCECTOMY LUMBAR 5- SACRAL1  . PARTIAL HYSTERECTOMY    . TONSILLECTOMY    . TONSILLECTOMY AND ADENOIDECTOMY    . TOTAL KNEE ARTHROPLASTY Right 08/08/2019  . TOTAL KNEE ARTHROPLASTY Right 08/08/2019   Procedure: RIGHT TOTAL KNEE ARTHROPLASTY;  Surgeon: Leandrew Koyanagi, MD;  Location: [place];  Service: Orthopedics;  Laterality: Right;  . TUBAL LIGATION    . UPPER GI ENDOSCOPY     ulcer    There were no vitals filed for this visit.  Subjective Assessment - 10/27/19 1358    Subjective  The knee feels ok today.  I'm going to need PT for my back at some point.  I got an order from my doctor.    Pertinent History  lumbar surgery    Limitations  Walking;Standing;Sitting    How long can you sit comfortably?  1 hour    How long can you stand comfortably?  20 (limited by back pain)    How long can you walk comfortably?  30-45' without cane    Patient Stated Goals  now driving up to 1 hour, errands x 40-45 min at a time,    Currently in Pain?  No/denies    Pain Location  Knee    Pain Orientation  Right    Pain Descriptors / Indicators  Tightness;Throbbing    Pain Type  Surgical pain    Pain Onset  More than a month ago    Pain Frequency   Intermittent    Aggravating Factors   walk >30-45'    Pain Relieving Factors  ice, meds, stretch    Effect of Pain on Daily Activities  increasing errands and driving time                       [facility] Adult PT Treatment/Exercise - 10/27/19 0001      Exercises   Exercises  Knee/Hip      Knee/Hip Exercises: Stretches   Gastroc Stretch  Left;3 reps;20 seconds    Gastroc Stretch Limitations  slant board    Other Knee/Hip Stretches  knee flexion ROM foot on 2nd step x 10 reps, PT cued deep flexion for end range      Knee/Hip Exercises: Aerobic   Recumbent Bike  L2 x 8', PT present to discuss symptoms      Knee/Hip Exercises: Machines for Strengthening   Cybex Leg Press  60# bil LEs 1x15, 30# Rt only 1x15      Knee/Hip Exercises: Standing   Knee Flexion  Strengthening;Right;1 set;10 reps    Knee Flexion Limitations  6lb, holding back of chair    Terminal Knee Extension  Strengthening;Right;10 reps    Terminal Knee Extension Limitations  foot on 3rd step quad set for end range ext    Hip Extension  Stengthening;Right;15 reps;Knee straight    Step Down  Left;Step Height: 6";10 reps;Hand Hold: 1;2 sets    Step Down Limitations  heel tap left      Knee/Hip Exercises: Seated   Long Arc Quad  Strengthening;Right;10 reps;2 sets    [employer] Weight  6 lbs.    Long [employer] Limitations  much improved end range strength/range    Sit to [employer]  10 reps   holding 10#, PT cued valgus control, hip hinge     Knee/Hip Exercises: Supine   Short Arc Quad Sets  Strengthening;Right;10 reps    Short Arc Quad Sets Limitations  6#, slow eccentric phase for control    Straight Leg Raises  Strengthening;Right;20 reps      Cryotherapy   Number Minutes Cryotherapy  10 Minutes    Cryotherapy Location  Knee    Type of Cryotherapy  Ice pack      Manual Therapy   Joint Mobilization  patellar mobs sup/inf/med/lat Gr III    Soft tissue mobilization  Rt knee med/lat joint lines, scar mobs     Passive ROM  flexion/extension Rt knee               PT Short Term Goals - 10/11/19 1407      PT SHORT TERM GOAL #1   Title  Pt  will report improved pain by at least 20%    Status  Achieved      PT SHORT TERM GOAL #2   Title  Pt will achieve improved Rt knee edema to </= 1.5cm difference at mid-patella compared to Lt knee.    Status  On-going      PT SHORT TERM GOAL #3   Title  Pt will be ind in initial HEP including scar massage and edema management and report daily compliance.    Status  Achieved      PT SHORT TERM GOAL #4   Title  Pt will achieve knee flexion to 95 deg or more for improved sit to stand and stand to sit transfers.    Status  Achieved        PT Long Term Goals - 10/25/19 1355      PT LONG TERM GOAL #1   Title  Pt will be ind with advanced HEP and understand how to safely progress.    Baseline  building resistance/reps    Time  8    Period  Weeks    Status  On-going    Target Date  12/20/19      PT LONG TERM GOAL #2   Title  Pt will achieve Rt knee flexion of at least 125 deg and extension to 0 degrees for functional tasks such as gait, alternating LE stair pattern, and sit to stand transfers.    Baseline  -5 to 120 deg on Rt    Time  8    Period  Weeks    Status  On-going    Target Date  12/20/19      PT LONG TERM GOAL #3   Title  Pt will be able to tolerate standing tasks >/= 45 min to enable household tasks such as cooking and cleaning.    Baseline  40 min    Time  8    Period  Weeks    Status  On-going    Target Date  12/20/19      PT LONG TERM GOAL #4   Title  Pt will be able to demo symmetrical gait pattern without use of AD for unlimited return to running errands and daily tasks independently with optimal Rt LE function.    Baseline  running errands without cane, slight asymmetry still present due to limited end range extension    Time  8    Period  Weeks    Status  On-going    Target Date  12/20/19      PT LONG TERM GOAL  #5   Title  Pt will achieve 5/5 strength surrounding Rt knee to improve daily task performance including ascending/descending 3 flights of stairs to get to apartment.    Baseline  Rt knee 4/5 ext, 4-/5 flexion    Time  8    Period  Weeks    Target Date  12/20/19            Plan - 10/27/19 1437    Clinical Impression Statement  Pt with much improving end range knee extension strength and range.  She was able to achieve end of her extension range with LAQ using 6lb ankle weight today x 15 reps.  PT increased bil leg press today to 60#.  Pt demo'ing improving eccentric Rt closed chain control with step down heel taps on Rt LE today.  Good edema control between visits.  Pt will continue to benefit from skilled PT along POC.  Pt is experiencing flare up of LBP with history of lumbar surgery and has new order for PT for lumbar region.  Pt will discuss transition timing from knee to lumbar PT with knee surgeon at next visit to ensure she reaches max benefit from PT for her knee post-operatively.    Comorbidities  Hx of lumbar lami summer 2020, has some ongoing lumbar pain, had Lt LE weakness prior to lumbar surgery    Rehab Potential  Excellent    PT Frequency  2x / week    PT Duration  8 weeks    PT Treatment/Interventions  ADLs/Self Care Home Management;Aquatic Therapy;Electrical Stimulation;Cryotherapy;Moist Heat;Neuromuscular re-education;Balance training;Therapeutic exercise;Therapeutic activities;Functional mobility training;Stair training;Gait training;Patient/family education;Manual techniques;Scar mobilization;Passive range of motion;Dry needling;Taping;Joint Manipulations    PT Next Visit Plan  continue Rt knee/hip strength, gait training to improve symmetry    PT Home Exercise Plan  Access Code: [email or web address], scar [email or web address]    Consulted and Agree with Plan of Care  Patient       Patient will benefit from skilled therapeutic intervention in order to improve the following deficits and  impairments:     Visit Diagnosis: Acute pain of right knee  Muscle weakness (generalized)     Problem List Patient Active Problem List   Diagnosis Date Noted  . Status post total right knee replacement 08/08/2019  . Pelvic pain in female 08/26/2017  . Pelvic peritoneal adhesions, female 08/26/2017  . Endometriosis of pelvis 08/26/2017  . Unilateral primary osteoarthritis, right knee 06/09/2017  . Unilateral primary osteoarthritis, left knee 06/09/2017  . Family history of breast cancer in mother 12/06/2015  . Menorrhagia 05/31/2013  . Dysmenorrhea 05/31/2013  . Dyspareunia 05/31/2013  . Asthma 08/05/2011    Baruch Merl, PT 10/27/19 2:41 PM   [facility] [address] [place], [place], [address] Phone: [phone]   Fax:  [phone]  Name: [name] [name] MRN: [id number] Date of Birth: 02-Jul-1981

## 2019-10-28 ENCOUNTER — Ambulatory Visit
Admission: RE | Admit: 2019-10-28 | Discharge: 2019-10-28 | Disposition: A | Discharge status: Home / Self Care | Payer: Medicaid Other | Source: Ambulatory Visit | Attending: Physician Assistant | Admitting: Physician Assistant

## 2019-10-28 DIAGNOSIS — Z1231 Encounter for screening mammogram for malignant neoplasm of breast: Secondary | ICD-10-CM

## 2019-11-01 ENCOUNTER — Encounter: Payer: Self-pay | Admitting: Physical Therapy

## 2019-11-01 ENCOUNTER — Encounter: Payer: Self-pay | Admitting: Orthopaedic Surgery

## 2019-11-01 ENCOUNTER — Other Ambulatory Visit: Payer: Self-pay

## 2019-11-01 ENCOUNTER — Ambulatory Visit: Payer: Medicaid Other | Admitting: Physical Therapy

## 2019-11-01 ENCOUNTER — Ambulatory Visit (INDEPENDENT_AMBULATORY_CARE_PROVIDER_SITE_OTHER): Payer: Medicaid Other | Admitting: Orthopaedic Surgery

## 2019-11-01 DIAGNOSIS — G8929 Other chronic pain: Secondary | ICD-10-CM

## 2019-11-01 DIAGNOSIS — M25561 Pain in right knee: Secondary | ICD-10-CM

## 2019-11-01 DIAGNOSIS — M6281 Muscle weakness (generalized): Secondary | ICD-10-CM

## 2019-11-01 DIAGNOSIS — Z96651 Presence of right artificial knee joint: Secondary | ICD-10-CM

## 2019-11-01 NOTE — Progress Notes (Signed)
Post-Op Visit Note   Patient: [name] [name]           Date of Birth: 1980-10-18           MRN: [id number] Visit Date: 11/01/2019 PCP: [place]:  Chief Complaint:  Chief Complaint  Patient presents with  . Right Knee - Follow-up   Visit Diagnoses:  1. Status post total right knee replacement     Plan: Eulene is 32-month status post right total knee replacement.  She is doing well overall.  No longer walking with a cane.  No real complaints other than some stiffness.  Her back is giving her some issues recently but she would like to continue PT for the knee.  Surgical scar is fully healed.  Range of motion is 0 to 105 degrees.  Stable to varus valgus.  From my standpoint she is doing well.  Dental prophylaxis reinforced.  Increase activity as tolerated.  Continue PT for range of motion strengthening.  Recheck in 3 months with two-view x-rays of the right knee.  Follow-Up Instructions: Return in about 3 months (around 01/31/2020).   Orders:  No orders of the defined types were placed in this encounter.  No orders of the defined types were placed in this encounter.   Imaging: No results found.  PMFS History: Patient Active Problem List   Diagnosis Date Noted  . Status post total right knee replacement 08/08/2019  . Pelvic pain in female 08/26/2017  . Pelvic peritoneal adhesions, female 08/26/2017  . Endometriosis of pelvis 08/26/2017  . Unilateral primary osteoarthritis, right knee 06/09/2017  . Unilateral primary osteoarthritis, left knee 06/09/2017  . Family history of breast cancer in mother 12/06/2015  . Menorrhagia 05/31/2013  . Dysmenorrhea 05/31/2013  . Dyspareunia 05/31/2013  . Asthma 08/05/2011   Past Medical History:  Diagnosis Date  . Allergy   . Arthritis    right knee  . Asthma   . Constipation   . Depression   . GERD (gastroesophageal reflux disease)   . History of kidney stones   . Panic disorder   .  Pneumonia   . Renal disorder   . Seasonal allergies   . SVD (spontaneous vaginal delivery)    x 3    Family History  Problem Relation Age of Onset  . Hypertension Mother   . Diabetes Mother   . Breast cancer Mother 35       IDC; s/p lumpectomy, radiation, and arimidex  . Prostate cancer Paternal Grandfather        dx. 50s-60s  . Other Sister        both full sisters also have dense breast tissue  . Breast cancer Maternal Grandmother 83       IDC  . Colon polyps Maternal Grandmother        approx 4-5 colon polyps  . Pancreatic cancer Other        maternal great aunt (MGF's sister) d. pancreatic cancer  . Cancer Other        maternal great aunt (MGF's sister) dx NOS cancer  . Cancer Other        maternal great grandmother (MGF's mother) d. NOS cancer in her 39s  . Cancer Cousin 25       paternal 1st cousin dx. w/ either throat or thyroid cancer; not a smoker  . Breast cancer Other        paternal great aunt (PGF's sister) dx. breast cancer, 25s  .  Colon cancer Neg Hx   . Rectal cancer Neg Hx   . Stomach cancer Neg Hx   . Esophageal cancer Neg Hx     Past Surgical History:  Procedure Laterality Date  . ABDOMINAL HYSTERECTOMY    . adiana    . BILATERAL SALPINGECTOMY Bilateral 05/31/2013   Procedure: BILATERAL SALPINGECTOMY;  Surgeon: Cheri Fowler, MD;  Location: [facility];  Service: Gynecology;  Laterality: Bilateral;  . BREAST CYST ASPIRATION Right 2015  . COLONOSCOPY    . DILATION AND CURETTAGE OF UTERUS    . DNC    . KIDNEY STONE SURGERY    . KNEE ARTHROSCOPY Right   . KNEE ARTHROSCOPY WITH DRILLING/MICROFRACTURE Right 07/26/2014   Procedure: RIGHT KNEE ARTHROSCOPY WITH CHONDROPLASTY, MICROFRACTURE;  Surgeon: Marianna Payment, MD;  Location: [place] [facility];  Service: Orthopedics;  Laterality: Right;  . KNEE ARTHROSCOPY WITH MEDIAL MENISECTOMY Right 07/26/2014   Procedure: KNEE ARTHROSCOPY WITH MEDIAL MENISECTOMY;  Surgeon: Marianna Payment, MD;  Location:  [place];  Service: Orthopedics;  Laterality: Right;  . LAPAROSCOPIC ASSISTED VAGINAL HYSTERECTOMY N/A 05/31/2013   Procedure: LAPAROSCOPIC ASSISTED VAGINAL HYSTERECTOMY;  Surgeon: Cheri Fowler, MD;  Location: [facility];  Service: Gynecology;  Laterality: N/A;  . LAPAROSCOPY N/A 08/26/2017   Procedure: LAPAROSCOPY OPERATIVE WITH PERIOTONEAL BIOPSY AND FULGERATION OF ENDOMETRIOSIS;  Surgeon: Cheri Fowler, MD;  Location: [facility];  Service: Gynecology;  Laterality: N/A;  . LUMBAR LAMINECTOMY/DECOMPRESSION MICRODISCECTOMY Right 12/31/2018   Procedure: MICRODISCECTOMY LUMBAR 5- SACRAL1;  Surgeon: Consuella Lose, MD;  Location: [facility];  Service: Neurosurgery;  Laterality: Right;  MICRODISCECTOMY LUMBAR 5- SACRAL1  . PARTIAL HYSTERECTOMY    . TONSILLECTOMY    . TONSILLECTOMY AND ADENOIDECTOMY    . TOTAL KNEE ARTHROPLASTY Right 08/08/2019  . TOTAL KNEE ARTHROPLASTY Right 08/08/2019   Procedure: RIGHT TOTAL KNEE ARTHROPLASTY;  Surgeon: Leandrew Koyanagi, MD;  Location: [facility];  Service: Orthopedics;  Laterality: Right;  . TUBAL LIGATION    . UPPER GI ENDOSCOPY     ulcer   Social History   Occupational History  . Not on file  Tobacco Use  . Smoking status: Never Smoker  . Smokeless tobacco: Never Used  Substance and Sexual Activity  . Alcohol use: No    Alcohol/week: 0.0 standard drinks  . Drug use: No  . Sexual activity: Not on file

## 2019-11-01 NOTE — Therapy (Signed)
[facility] [address] [place], [place], [address] Phone: [phone]   Fax:  [phone]  Physical Therapy Treatment  Patient Details  Name: [name] [name] MRN: [id number] Date of Birth: 1980/08/06 Referring Provider (PT): Aundra Dubin, [place]   Encounter Date: 11/01/2019  PT End of Session - 11/01/19 1402    Visit Number  15    Date for PT Re-Evaluation  12/20/19    Authorization Type  Medicaid    Authorization Time Period  10/29/19-12/12/19    Authorization - Visit Number  1    Authorization - Number of Visits  10    PT Start Time  1400    PT Stop Time  1450    PT Time Calculation (min)  50 min    Activity Tolerance  Patient tolerated treatment well    Behavior During Therapy  [facility] for tasks assessed/performed       Past Medical History:  Diagnosis Date  . Allergy   . Arthritis    right knee  . Asthma   . Constipation   . Depression   . GERD (gastroesophageal reflux disease)   . History of kidney stones   . Panic disorder   . Pneumonia   . Renal disorder   . Seasonal allergies   . SVD (spontaneous vaginal delivery)    x 3    Past Surgical History:  Procedure Laterality Date  . ABDOMINAL HYSTERECTOMY    . adiana    . BILATERAL SALPINGECTOMY Bilateral 05/31/2013   Procedure: BILATERAL SALPINGECTOMY;  Surgeon: Cheri Fowler, MD;  Location: [facility];  Service: Gynecology;  Laterality: Bilateral;  . BREAST CYST ASPIRATION Right 2015  . COLONOSCOPY    . DILATION AND CURETTAGE OF UTERUS    . DNC    . KIDNEY STONE SURGERY    . KNEE ARTHROSCOPY Right   . KNEE ARTHROSCOPY WITH DRILLING/MICROFRACTURE Right 07/26/2014   Procedure: RIGHT KNEE ARTHROSCOPY WITH CHONDROPLASTY, MICROFRACTURE;  Surgeon: Marianna Payment, MD;  Location: [place];  Service: Orthopedics;  Laterality: Right;  . KNEE ARTHROSCOPY WITH MEDIAL MENISECTOMY Right 07/26/2014   Procedure: KNEE ARTHROSCOPY WITH  MEDIAL MENISECTOMY;  Surgeon: Marianna Payment, MD;  Location: [place];  Service: Orthopedics;  Laterality: Right;  . LAPAROSCOPIC ASSISTED VAGINAL HYSTERECTOMY N/A 05/31/2013   Procedure: LAPAROSCOPIC ASSISTED VAGINAL HYSTERECTOMY;  Surgeon: Cheri Fowler, MD;  Location: [facility];  Service: Gynecology;  Laterality: N/A;  . LAPAROSCOPY N/A 08/26/2017   Procedure: LAPAROSCOPY OPERATIVE WITH PERIOTONEAL BIOPSY AND FULGERATION OF ENDOMETRIOSIS;  Surgeon: Cheri Fowler, MD;  Location: [facility];  Service: Gynecology;  Laterality: N/A;  . LUMBAR LAMINECTOMY/DECOMPRESSION MICRODISCECTOMY Right 12/31/2018   Procedure: MICRODISCECTOMY LUMBAR 5- SACRAL1;  Surgeon: Consuella Lose, MD;  Location: [place];  Service: Neurosurgery;  Laterality: Right;  MICRODISCECTOMY LUMBAR 5- SACRAL1  . PARTIAL HYSTERECTOMY    . TONSILLECTOMY    . TONSILLECTOMY AND ADENOIDECTOMY    . TOTAL KNEE ARTHROPLASTY Right 08/08/2019  . TOTAL KNEE ARTHROPLASTY Right 08/08/2019   Procedure: RIGHT TOTAL KNEE ARTHROPLASTY;  Surgeon: Leandrew Koyanagi, MD;  Location: [place];  Service: Orthopedics;  Laterality: Right;  . TUBAL LIGATION    . UPPER GI ENDOSCOPY     ulcer    There were no vitals filed for this visit.  Subjective Assessment - 11/01/19 1400    Subjective  I walked 1 hour on a trail the other day and I was hurting getting back.  Pertinent History  lumbar surgery    Limitations  Walking;Standing;Sitting    How long can you sit comfortably?  1 hour    How long can you stand comfortably?  20 (limited by back pain)    How long can you walk comfortably?  78' no cane    Patient Stated Goals  now driving up to 1 hour, errands x 40-45 min at a time,    Currently in Pain?  No/denies    Pain Onset  More than a month ago    Aggravating Factors   walking >45'    Pain Relieving Factors  ice, meds, stretch                       [facility] Adult PT Treatment/Exercise - 11/01/19 0001      Exercises    Exercises  Knee/Hip      Knee/Hip Exercises: Stretches   Gastroc Stretch  Left;2 reps;20 seconds    Gastroc Stretch Limitations  slant board    Other Knee/Hip Stretches  knee flexion foot on 2nd step x 10 reps, hold 5 sec      Knee/Hip Exercises: Aerobic   Recumbent Bike  L2 x 8', PT present to discuss HEP and functional progress      Knee/Hip Exercises: Machines for Strengthening   Cybex Knee Extension  10lb 1x10 Rt with Lt assist on concentric, Rt only for eccentric    Cybex Knee Flexion  Rt with Lt assist concentric/eccentric 15lb 2x5, Rt only 10lb 1x10      Knee/Hip Exercises: Standing   SLS with Vectors  on Rt LE, Lt toe tap clock drill 12:00-6:00 x 3 cycles, progressed to Rt LE mini squat with heel taps 3 ways x 4 cycles      Knee/Hip Exercises: Supine   Bridges  Strengthening;Both;1 set;10 reps    Other Supine Knee/Hip Exercises  hamstring curls bil feet on red ball x 10 rolling in/out      Cryotherapy   Number Minutes Cryotherapy  10 Minutes    Cryotherapy Location  Knee    Type of Cryotherapy  Ice pack      Manual Therapy   Joint Mobilization  patellar mobs sup/inf/med/lat Gr III    Passive ROM  knee flexion end range x 5 rounds               PT Short Term Goals - 10/11/19 1407      PT SHORT TERM GOAL #1   Title  Pt will report improved pain by at least 20%    Status  Achieved      PT SHORT TERM GOAL #2   Title  Pt will achieve improved Rt knee edema to </= 1.5cm difference at mid-patella compared to Lt knee.    Status  On-going      PT SHORT TERM GOAL #3   Title  Pt will be ind in initial HEP including scar massage and edema management and report daily compliance.    Status  Achieved      PT SHORT TERM GOAL #4   Title  Pt will achieve knee flexion to 95 deg or more for improved sit to stand and stand to sit transfers.    Status  Achieved        PT Long Term Goals - 10/25/19 1355      PT LONG TERM GOAL #1   Title  Pt will be ind with advanced  HEP and understand  how to safely progress.    Baseline  building resistance/reps    Time  8    Period  Weeks    Status  On-going    Target Date  12/20/19      PT LONG TERM GOAL #2   Title  Pt will achieve Rt knee flexion of at least 125 deg and extension to 0 degrees for functional tasks such as gait, alternating LE stair pattern, and sit to stand transfers.    Baseline  -5 to 120 deg on Rt    Time  8    Period  Weeks    Status  On-going    Target Date  12/20/19      PT LONG TERM GOAL #3   Title  Pt will be able to tolerate standing tasks >/= 45 min to enable household tasks such as cooking and cleaning.    Baseline  40 min    Time  8    Period  Weeks    Status  On-going    Target Date  12/20/19      PT LONG TERM GOAL #4   Title  Pt will be able to demo symmetrical gait pattern without use of AD for unlimited return to running errands and daily tasks independently with optimal Rt LE function.    Baseline  running errands without cane, slight asymmetry still present due to limited end range extension    Time  8    Period  Weeks    Status  On-going    Target Date  12/20/19      PT LONG TERM GOAL #5   Title  Pt will achieve 5/5 strength surrounding Rt knee to improve daily task performance including ascending/descending 3 flights of stairs to get to apartment.    Baseline  Rt knee 4/5 ext, 4-/5 flexion    Time  8    Period  Weeks    Target Date  12/20/19            Plan - 11/01/19 1442    Clinical Impression Statement  Pt reports pain ranges from 0-3/10 depending on activity level.  She walked x 60 min since last visit on a trail and had pain towards end of walk due to fatigue and it being uphill.  She reports ongoing difficulty with deep sqautting due to feeling of stiffness/anterior knee stretching.  She also has some tenderness if she has to put pressure on the Rt knee in kneeling such as when she changes bed sheets.  Session focused on end range flexion stretching, knee  strength via machines with increased resistance, and closed chain SLS with vector reaches/mini lunges.  Pt with improving bulk in Rt quad.  Strength of quad and hamstring is 4/5.  She will continue to benefit from skilled PT along POC with increased strength/resistance progression for full return to daily tasks.    Comorbidities  Hx of lumbar lami summer 2020, has some ongoing lumbar pain, had Lt LE weakness prior to lumbar surgery    Rehab Potential  Excellent    PT Frequency  2x / week    PT Duration  8 weeks    PT Treatment/Interventions  ADLs/Self Care Home Management;Aquatic Therapy;Electrical Stimulation;Cryotherapy;Moist Heat;Neuromuscular re-education;Balance training;Therapeutic exercise;Therapeutic activities;Functional mobility training;Stair training;Gait training;Patient/family education;Manual techniques;Scar mobilization;Passive range of motion;Dry needling;Taping;Joint Manipulations    PT Next Visit Plan  continue SLS vectors/clock drill on Rt LE, mini lunges, squats holding weight plate at chest, cybex knee flex/ext, leg press,  bridges, ham curls, deep squat stretch holding edge of TM    PT Home Exercise Plan  Access Code: [email or web address], [email or web address]    Consulted and Agree with Plan of Care  Patient       Patient will benefit from skilled therapeutic intervention in order to improve the following deficits and impairments:     Visit Diagnosis: Acute pain of right knee  Muscle weakness (generalized)  Chronic bilateral low back pain, unspecified whether sciatica present     Problem List Patient Active Problem List   Diagnosis Date Noted  . Status post total right knee replacement 08/08/2019  . Pelvic pain in female 08/26/2017  . Pelvic peritoneal adhesions, female 08/26/2017  . Endometriosis of pelvis 08/26/2017  . Unilateral primary osteoarthritis, right knee 06/09/2017  . Unilateral primary osteoarthritis, left knee 06/09/2017  . Family history of breast cancer in mother  12/06/2015  . Menorrhagia 05/31/2013  . Dysmenorrhea 05/31/2013  . Dyspareunia 05/31/2013  . Asthma 08/05/2011    Baruch Merl, PT 11/01/19 2:47 PM   [facility] [address], [place], [place], [address] Phone: [phone]   Fax:  [phone]  Name: [name] [name] MRN: [id number] Date of Birth: [birth year]

## 2019-11-03 ENCOUNTER — Other Ambulatory Visit: Payer: Self-pay

## 2019-11-03 ENCOUNTER — Encounter: Payer: Self-pay | Admitting: Physical Therapy

## 2019-11-03 ENCOUNTER — Ambulatory Visit: Payer: Medicaid Other | Admitting: Physical Therapy

## 2019-11-03 DIAGNOSIS — M6281 Muscle weakness (generalized): Secondary | ICD-10-CM

## 2019-11-03 DIAGNOSIS — G8929 Other chronic pain: Secondary | ICD-10-CM

## 2019-11-03 DIAGNOSIS — M25561 Pain in right knee: Secondary | ICD-10-CM

## 2019-11-03 NOTE — Therapy (Signed)
[facility] [address], [place], [place], [address] Phone: [phone]   Fax:  [phone]  Physical Therapy Treatment  Patient Details  Name: Moe Brier MRN: [id number] Date of Birth: Apr 19, 1981 Referring Provider (PT): Aundra Dubin, [place]   Encounter Date: 11/03/2019  PT End of Session - 11/03/19 1403    Visit Number  16    Date for PT Re-Evaluation  12/20/19    Authorization Type  Medicaid    Authorization Time Period  10/29/19-12/12/19    Authorization - Visit Number  2    Authorization - Number of Visits  10    PT Start Time  1400    Activity Tolerance  Patient tolerated treatment well    Behavior During Therapy  [facility] for tasks assessed/performed       Past Medical History:  Diagnosis Date  . Allergy   . Arthritis    right knee  . Asthma   . Constipation   . Depression   . GERD (gastroesophageal reflux disease)   . History of kidney stones   . Panic disorder   . Pneumonia   . Renal disorder   . Seasonal allergies   . SVD (spontaneous vaginal delivery)    x 3    Past Surgical History:  Procedure Laterality Date  . ABDOMINAL HYSTERECTOMY    . adiana    . BILATERAL SALPINGECTOMY Bilateral 05/31/2013   Procedure: BILATERAL SALPINGECTOMY;  Surgeon: Cheri Fowler, MD;  Location: [facility];  Service: Gynecology;  Laterality: Bilateral;  . BREAST CYST ASPIRATION Right 2015  . COLONOSCOPY    . DILATION AND CURETTAGE OF UTERUS    . DNC    . KIDNEY STONE SURGERY    . KNEE ARTHROSCOPY Right   . KNEE ARTHROSCOPY WITH DRILLING/MICROFRACTURE Right 07/26/2014   Procedure: RIGHT KNEE ARTHROSCOPY WITH CHONDROPLASTY, MICROFRACTURE;  Surgeon: Marianna Payment, MD;  Location: [place];  Service: Orthopedics;  Laterality: Right;  . KNEE ARTHROSCOPY WITH MEDIAL MENISECTOMY Right 07/26/2014   Procedure: KNEE ARTHROSCOPY WITH MEDIAL MENISECTOMY;  Surgeon: Marianna Payment, MD;  Location:  [place];  Service: Orthopedics;  Laterality: Right;  . LAPAROSCOPIC ASSISTED VAGINAL HYSTERECTOMY N/A 05/31/2013   Procedure: LAPAROSCOPIC ASSISTED VAGINAL HYSTERECTOMY;  Surgeon: Cheri Fowler, MD;  Location: [facility];  Service: Gynecology;  Laterality: N/A;  . LAPAROSCOPY N/A 08/26/2017   Procedure: LAPAROSCOPY OPERATIVE WITH PERIOTONEAL BIOPSY AND FULGERATION OF ENDOMETRIOSIS;  Surgeon: Cheri Fowler, MD;  Location: [facility];  Service: Gynecology;  Laterality: N/A;  . LUMBAR LAMINECTOMY/DECOMPRESSION MICRODISCECTOMY Right 12/31/2018   Procedure: MICRODISCECTOMY LUMBAR 5- SACRAL1;  Surgeon: Consuella Lose, MD;  Location: [place];  Service: Neurosurgery;  Laterality: Right;  MICRODISCECTOMY LUMBAR 5- SACRAL1  . PARTIAL HYSTERECTOMY    . TONSILLECTOMY    . TONSILLECTOMY AND ADENOIDECTOMY    . TOTAL KNEE ARTHROPLASTY Right 08/08/2019  . TOTAL KNEE ARTHROPLASTY Right 08/08/2019   Procedure: RIGHT TOTAL KNEE ARTHROPLASTY;  Surgeon: Leandrew Koyanagi, MD;  Location: [place];  Service: Orthopedics;  Laterality: Right;  . TUBAL LIGATION    . UPPER GI ENDOSCOPY     ulcer    There were no vitals filed for this visit.  Subjective Assessment - 11/03/19 1401    Subjective  No pain today.  Not as tight either.  I feel like my knee is starting to have range that it is supposed to have now.  I'm ready to manage the knee on my  own with HEP after today.    Pertinent History  lumbar surgery    Limitations  Walking;Standing;Sitting    How long can you sit comfortably?  1 hour    How long can you stand comfortably?  20 (limited by back pain)    How long can you walk comfortably?  49' no cane    Patient Stated Goals  now driving up to 1 hour, errands x 40-45 min at a time,    Currently in Pain?  No/denies    Pain Score  0-No pain    Pain Onset  More than a month ago    Pain Frequency  Intermittent    Aggravating Factors   walk >45'    Pain Relieving Factors  ice, meds, stretch          [facility] PT Assessment - 11/03/19 0001      Assessment   Medical Diagnosis  Z96.651 (ICD-10-CM) - Hx of total knee replacement, right    Referring Provider (PT)  Aundra Dubin, PA-C    Onset Date/Surgical Date  08/08/19    Hand Dominance  Right    Next MD Visit  4 weeks    Prior Therapy  home health      Functional Tests   Functional tests  Step down;Step up      Step Up   Comments  symmetrical, ind      Sit to Stand   Comments  symmetrical, ind      AROM   Right Knee Extension  -2    Right Knee Flexion  122      Strength   Right Knee Flexion  4/5    Right Knee Extension  4/5      Flexibility   Soft Tissue Assessment /Muscle Length  no      Ambulation/Gait   Gait Comments  slight asymmetry due to limited end range ext on Rt knee                   [facility] Adult PT Treatment/Exercise - 11/03/19 0001      Exercises   Exercises  Knee/Hip      Knee/Hip Exercises: Stretches   Active Hamstring Stretch  Right;2 reps;20 seconds    Active Hamstring Stretch Limitations  foot on 2nd step    Gastroc Stretch  Left;2 reps;20 seconds    Gastroc Stretch Limitations  slant board    Other Knee/Hip Stretches  knee flexion foot on 2nd step x 10 reps, hold 5 sec      Knee/Hip Exercises: Aerobic   Recumbent Bike  L2 x 8', PT present to review goals      Knee/Hip Exercises: Machines for Strengthening   Cybex Knee Extension  --    Cybex Knee Flexion  Rt with Lt assist concentric/eccentric 15lb 2x10    Total Gym Leg Press  60lb bil LEs x 20, 30lb Rt LE 2x10      Knee/Hip Exercises: Standing   Forward Step Up  Right;Step Height: 6";Hand Hold: 1;15 reps    Step Down  Left;Hand Hold: 1;Step Height: 6";10 reps    SLS with Vectors  on Rt LE, Lt toe tap clock drill 12:00-6:00 x 3 cycles, progressed to Rt LE mini squat with heel taps 3 ways x 4 cycles      Knee/Hip Exercises: Seated   Long Arc Quad  Strengthening;Right;2 sets;10 reps;Weights    Long Arc Quad Weight  5  lbs.    Sit to  Sand  1 set;10 reps   holding 10lb weight plate at chest     Cryotherapy   Number Minutes Cryotherapy  10 Minutes    Cryotherapy Location  Knee    Type of Cryotherapy  Ice pack      Manual Therapy   Joint Mobilization  patellar mobs sup/inf/med/lat Gr III               PT Short Term Goals - 10/11/19 1407      PT SHORT TERM GOAL #1   Title  Pt will report improved pain by at least 20%    Status  Achieved      PT SHORT TERM GOAL #2   Title  Pt will achieve improved Rt knee edema to </= 1.5cm difference at mid-patella compared to Lt knee.    Status  On-going      PT SHORT TERM GOAL #3   Title  Pt will be ind in initial HEP including scar massage and edema management and report daily compliance.    Status  Achieved      PT SHORT TERM GOAL #4   Title  Pt will achieve knee flexion to 95 deg or more for improved sit to stand and stand to sit transfers.    Status  Achieved        PT Long Term Goals - 11/03/19 1404      PT LONG TERM GOAL #1   Title  Pt will be ind with advanced HEP and understand how to safely progress.    Status  Achieved      PT LONG TERM GOAL #2   Title  Pt will achieve Rt knee flexion of at least 125 deg and extension to 0 degrees for functional tasks such as gait, alternating LE stair pattern, and sit to stand transfers.    Baseline  -2-122    Status  Partially Met      PT LONG TERM GOAL #3   Title  Pt will be able to tolerate standing tasks >/= 45 min to enable household tasks such as cooking and cleaning.    Status  Achieved      PT LONG TERM GOAL #4   Title  Pt will be able to demo symmetrical gait pattern without use of AD for unlimited return to running errands and daily tasks independently with optimal Rt LE function.    Baseline  running errands without cane, slight asymmetry still present due to limited end range extension    Status  Partially Met      PT LONG TERM GOAL #5   Title  Pt will achieve 5/5 strength  surrounding Rt knee to improve daily task performance including ascending/descending 3 flights of stairs to get to apartment.    Baseline  4/5 Rt knee    Status  Partially Met            Plan - 11/03/19 1420    Clinical Impression Statement  Pt has made great progress in PT and has met or partially met all LTGs.  Rt knee range measures -2 to 122.  Rt knee strength is 4/5.  SLS balance is >30 sec without UEs.  Pt reports she has returned to 90% of daily tasks and activities.  She is starting to take walks > 30', run longer errands, and participate in easier hikes.  She wishes to d/c to HEP at this point so she can focus on rehab for her return of LBP since  her knee surgery.  She will continue HEP to target remaining strength and ROM deficits.    Comorbidities  Hx of lumbar lami summer 2020, has some ongoing lumbar pain, had Lt LE weakness prior to lumbar surgery    Stability/Clinical Decision Making  Stable/Uncomplicated    Rehab Potential  Excellent    PT Frequency  2x / week    PT Duration  8 weeks    PT Treatment/Interventions  ADLs/Self Care Home Management;Aquatic Therapy;Electrical Stimulation;Cryotherapy;Moist Heat;Neuromuscular re-education;Balance training;Therapeutic exercise;Therapeutic activities;Functional mobility training;Stair training;Gait training;Patient/family education;Manual techniques;Scar mobilization;Passive range of motion;Dry needling;Taping;Joint Manipulations    PT Next Visit Plan  d/c to HEP    PT Home Exercise Plan  Access Code: [email or web address], [email or web address]    Consulted and Agree with Plan of Care  Patient       Patient will benefit from skilled therapeutic intervention in order to improve the following deficits and impairments:  Abnormal gait, Decreased range of motion, Difficulty walking, Decreased activity tolerance, Pain, Decreased balance, Decreased scar mobility, Hypomobility, Impaired flexibility, Improper body mechanics, Decreased mobility, Decreased strength,  Increased edema, Postural dysfunction  Visit Diagnosis: Acute pain of right knee  Muscle weakness (generalized)  Chronic bilateral low back pain, unspecified whether sciatica present     Problem List Patient Active Problem List   Diagnosis Date Noted  . Status post total right knee replacement 08/08/2019  . Pelvic pain in female 08/26/2017  . Pelvic peritoneal adhesions, female 08/26/2017  . Endometriosis of pelvis 08/26/2017  . Unilateral primary osteoarthritis, right knee 06/09/2017  . Unilateral primary osteoarthritis, left knee 06/09/2017  . Family history of breast cancer in mother 12/06/2015  . Menorrhagia 05/31/2013  . Dysmenorrhea 05/31/2013  . Dyspareunia 05/31/2013  . Asthma 08/05/2011    PHYSICAL THERAPY DISCHARGE SUMMARY  Visits from Start of Care: 16  Current functional level related to goals / functional outcomes: See above   Remaining deficits: See above   Education / Equipment: HEP Plan: Patient agrees to discharge.  Patient goals were partially met. Patient is being discharged due to being pleased with the current functional level.  ?????         Baruch Merl, PT 11/03/19 2:30 PM   [facility] [address] [place], [place], [address] Phone: [phone]   Fax:  [phone]  Name: Demesha Boorman MRN: [id number] Date of Birth: Aug 10, 1980

## 2019-11-07 ENCOUNTER — Ambulatory Visit: Payer: Medicaid Other | Admitting: Physical Therapy

## 2019-11-09 ENCOUNTER — Ambulatory Visit: Payer: Medicaid Other | Attending: Physician Assistant | Admitting: Physical Therapy

## 2019-11-09 ENCOUNTER — Other Ambulatory Visit: Payer: Self-pay

## 2019-11-09 ENCOUNTER — Encounter: Payer: Self-pay | Admitting: Physical Therapy

## 2019-11-09 DIAGNOSIS — M6281 Muscle weakness (generalized): Secondary | ICD-10-CM | POA: Diagnosis present

## 2019-11-09 DIAGNOSIS — G8929 Other chronic pain: Secondary | ICD-10-CM | POA: Insufficient documentation

## 2019-11-09 DIAGNOSIS — M5441 Lumbago with sciatica, right side: Secondary | ICD-10-CM | POA: Diagnosis not present

## 2019-11-09 DIAGNOSIS — M5442 Lumbago with sciatica, left side: Secondary | ICD-10-CM | POA: Diagnosis present

## 2019-11-09 DIAGNOSIS — R293 Abnormal posture: Secondary | ICD-10-CM | POA: Insufficient documentation

## 2019-11-09 NOTE — Patient Instructions (Signed)
Access Code: [email or web address] URL: [email or web address]: 05/05/2021Prepared by: Venetia Night BeuhringExercises  Sidelying Transversus Abdominis Bracing - 1 x daily - 7 x weekly - 2 sets - 10 reps - 10 hold

## 2019-11-09 NOTE — Therapy (Signed)
[facility] [address] [place], [place], [address] Phone: [phone]   Fax:  [phone]  Physical Therapy Evaluation  Patient Details  Name: [name] [name] MRN: [id number] Date of Birth: September 14, 1980 Referring Provider (PT): Traci Sermon, [place]   Encounter Date: 11/09/2019  PT End of Session - 11/09/19 1242    Visit Number  1    Date for PT Re-Evaluation  01/04/20    Authorization Type  Medicaid    Authorization Time Period  submitting for auth for first 3 visits    Authorization - Number of Visits  27    PT Start Time  0930    PT Stop Time  1010    PT Time Calculation (min)  40 min    Activity Tolerance  Patient tolerated treatment well;Patient limited by pain    Behavior During Therapy  [facility] for tasks assessed/performed       Past Medical History:  Diagnosis Date  . Allergy   . Arthritis    right knee  . Asthma   . Constipation   . Depression   . GERD (gastroesophageal reflux disease)   . History of kidney stones   . Panic disorder   . Pneumonia   . Renal disorder   . Seasonal allergies   . SVD (spontaneous vaginal delivery)    x 3    Past Surgical History:  Procedure Laterality Date  . ABDOMINAL HYSTERECTOMY    . adiana    . BILATERAL SALPINGECTOMY Bilateral 05/31/2013   Procedure: BILATERAL SALPINGECTOMY;  Surgeon: Cheri Fowler, MD;  Location: [facility];  Service: Gynecology;  Laterality: Bilateral;  . BREAST CYST ASPIRATION Right 2015  . COLONOSCOPY    . DILATION AND CURETTAGE OF UTERUS    . DNC    . KIDNEY STONE SURGERY    . KNEE ARTHROSCOPY Right   . KNEE ARTHROSCOPY WITH DRILLING/MICROFRACTURE Right 07/26/2014   Procedure: RIGHT KNEE ARTHROSCOPY WITH CHONDROPLASTY, MICROFRACTURE;  Surgeon: Marianna Payment, MD;  Location: [place];  Service: Orthopedics;  Laterality: Right;  . KNEE ARTHROSCOPY WITH MEDIAL MENISECTOMY Right 07/26/2014   Procedure: KNEE  ARTHROSCOPY WITH MEDIAL MENISECTOMY;  Surgeon: Marianna Payment, MD;  Location: [place];  Service: Orthopedics;  Laterality: Right;  . LAPAROSCOPIC ASSISTED VAGINAL HYSTERECTOMY N/A 05/31/2013   Procedure: LAPAROSCOPIC ASSISTED VAGINAL HYSTERECTOMY;  Surgeon: Cheri Fowler, MD;  Location: [facility];  Service: Gynecology;  Laterality: N/A;  . LAPAROSCOPY N/A 08/26/2017   Procedure: LAPAROSCOPY OPERATIVE WITH PERIOTONEAL BIOPSY AND FULGERATION OF ENDOMETRIOSIS;  Surgeon: Cheri Fowler, MD;  Location: [facility];  Service: Gynecology;  Laterality: N/A;  . LUMBAR LAMINECTOMY/DECOMPRESSION MICRODISCECTOMY Right 12/31/2018   Procedure: MICRODISCECTOMY LUMBAR 5- SACRAL1;  Surgeon: Consuella Lose, MD;  Location: [facility];  Service: Neurosurgery;  Laterality: Right;  MICRODISCECTOMY LUMBAR 5- SACRAL1  . PARTIAL HYSTERECTOMY    . TONSILLECTOMY    . TONSILLECTOMY AND ADENOIDECTOMY    . TOTAL KNEE ARTHROPLASTY Right 08/08/2019  . TOTAL KNEE ARTHROPLASTY Right 08/08/2019   Procedure: RIGHT TOTAL KNEE ARTHROPLASTY;  Surgeon: Leandrew Koyanagi, MD;  Location: [facility];  Service: Orthopedics;  Laterality: Right;  . TUBAL LIGATION    . UPPER GI ENDOSCOPY     ulcer    There were no vitals filed for this visit.   Subjective Assessment - 11/09/19 0931    Subjective  Pt with intermittent history of acute low back pain and LE muscle spasms x 5-10 years.  "  I throw my back out from time to time and it lays me up."  Pt had L5/S1 microdiscectomy June 2020.  No PT after surgery.  Had Rt TKR 3 mos ago and noted new flare up of back pain following knee surgery.  Pain is located in central lumbar region with intemittent posterior thigh and calf pain bil.    Pertinent History  lumbar surgery    Limitations  Sitting;Standing    How long can you sit comfortably?  1 hour, heat helps, supported back helps    How long can you stand comfortably?  30 min    How long can you walk comfortably?  1 hour    Diagnostic tests   MRI, multi-level disc degeneration    Currently in Pain?  Yes    Pain Score  5     Pain Location  Back    Pain Orientation  Lower;Mid;Medial    Pain Descriptors / Indicators  Throbbing;Stabbing;Tiring    Pain Type  Chronic pain    Pain Radiating Towards  occassionally bil post thighs and legs    Pain Onset  More than a month ago    Pain Frequency  Intermittent    Aggravating Factors   sitting, standing, squatting, lifting, bending    Pain Relieving Factors  heat, sitting on exercise ball    Effect of Pain on Daily Activities  household tasks, cooking, cleaning, laundry, driving, laying supine         [facility] PT Assessment - 11/09/19 0001      Assessment   Medical Diagnosis  M54.5 (ICD-10-CM) - Low back pain    Referring Provider (PT)  Cato Mulligan, Vista Mink, PA-C    Onset Date/Surgical Date  11/21/19    Hand Dominance  Right    Prior Therapy  for knee at this facility      Precautions   Precautions  None      Restrictions   Weight Bearing Restrictions  No      Balance Screen   Has the patient fallen in the past 6 months  No      [place] residence    Living Arrangements  Spouse/significant other;Children    Type of [place] to enter    [place]  One level      Prior Function   Level of Independence  Independent    Vocation  Unemployed    Leisure  travel, hike, walk, family time, swim      Cognition   Overall Cognitive Status  Within Functional Limits for tasks assessed      Functional Tests   Functional tests  Squat      Squat   Comments  uses hands to walk up thighs on return to stand      Posture/Postural Control   Posture/Postural Control  Postural limitations    Postural Limitations  Decreased thoracic kyphosis;Increased lumbar lordosis      ROM / Strength   AROM / PROM / Strength  AROM;Strength      AROM   Overall AROM   Deficits    AROM Assessment Site  Lumbar    Lumbar Flexion   15 pain    Lumbar Extension  10 pain    Lumbar - Left Side Bend  15   Rt LBP   Lumbar - Right Rotation  15      Strength   Overall Strength Comments  core 3+/5, atrophy and poor recruitment of lumbar multifidi L3-S1 bil    Strength Assessment Site  Hip    Right/Left Hip  Right;Left    Right Hip Flexion  4-/5    Right Hip Extension  4-/5    Right Hip External Rotation   3+/5    Right Hip Internal Rotation  4/5    Right Hip ABduction  3+/5    Right Hip ADduction  4+/5    Left Hip Flexion  4-/5    Left Hip Extension  4-/5    Left Hip External Rotation  3+/5    Left Hip Internal Rotation  4/5    Left Hip ABduction  3+/5    Left Hip ADduction  4+/5      Flexibility   Soft Tissue Assessment /Muscle Length  yes   limited hip flexors 30% bil   Hamstrings  limited 20%    Quadriceps  limited on Rt 30%, Lt 20%    Piriformis  limited bil 20%      Palpation   Spinal mobility  poor skin rolling L2-L5, poor segmental flexion L2-S1, with pain    Palpation comment  bil gluts, piriformis, lumbar multifidi, lumbar paraspinals      Bed Mobility   Bed Mobility  --   slow and painful with rolling, sit to sidelying, SL to supin     Ambulation/Gait   Gait Pattern  Decreased stride length;Step-through pattern   slow deliberate gait for lumbar pain               Objective measurements completed on examination: See above findings.                PT Short Term Goals - 11/09/19 1306      PT SHORT TERM GOAL #1   Title  Pt will demo A/ROM for lumbar flexion to at least 50 deg.    Time  4    Period  Weeks    Status  New    Target Date  12/07/19      PT SHORT TERM GOAL #2   Title  Pt will demo LE flexibility for hips to WNL to reduce undue strain of hip flexors, quads, gluteals and piriformis on lumbar spine.    Time  4    Period  Weeks    Status  New    Target Date  12/07/19        PT Long Term Goals - 11/09/19 1255      PT LONG TERM GOAL #1   Title  Pt will  be able to demo proper core use with functional transfers including bed mobility, sit to stand, functional squat, and lift 10# with pain not to exceed 3/10.    Baseline  core strength 3/5 with pain up to 8/10 with these activities, poor body mechanics and limited mobility for squat    Time  8    Period  Weeks    Status  New    Target Date  01/04/20      PT LONG TERM GOAL #2   Title  Pt will be ind with advanced HEP for improved core strength, LE strength, and functional task training to reduce pain and function for daily tasks.    Baseline  no knowledge    Time  8    Period  Weeks    Status  New    Target Date  01/04/20      PT LONG TERM GOAL #  3   Title  Pt will tolerate standing and sitting tasks for up to 1 hour with pain not to exceed 3/10.    Baseline  can stand 30 min, sit 1 hour with pain rating 6+/10    Time  8    Period  Weeks    Status  New    Target Date  01/04/20      PT LONG TERM GOAL #4   Title  Pt will report </= 2 sleep disruptions due to low back pain consistently throughout the week.    Baseline  turns from side to side up to 8-10 times a night due to pain    Time  8    Period  Weeks    Status  New    Target Date  01/04/20      PT LONG TERM GOAL #5   Title  Pt will achieve core and LE strength rating of at least 4+/5 to return to full activities.    Baseline  strength ranges from 3-4/5, core 3/5    Time  8    Period  Weeks    Status  New    Target Date  01/04/20             Plan - 11/09/19 1010    Clinical Impression Statement  Pt is a 39yo female with central LBP and intermittent posterior thigh and calf pain which was exacerbated over past 3 months following Rt TKR surgery.  She had an L5/S1 microdiscectomy in June 2020 and did not do any PT afterwards.  Pain was well managed until knee surgery.  She presents with postural deficits, signif weakness in core muscles, limited and painful A/ROM of trunk in all planes especially flexion and extension, and  diffuse tender trigger points throughout lumbar spine and bil hips.  She has pain with prolonged sitting, standing, sit to stand, bed mobility, and all household tasks.  PT started her on HEP for SL TrA contractions and encouraged her to use this "internal back brace" and/or supplement with her lumbar brace for aggravating activities.  Pt will benefit from skilled PT to address these deficits and improve overall tolerance and pain with daily activities.    Personal Factors and Comorbidities  Comorbidity 1    Comorbidities  lumbar microdiscectomy June 2020, Rt TKR Feb 2021 (therapy at this facility)    Examination-Activity Limitations  Transfers;Locomotion Level;Bed Mobility;Bend;Squat;Lift;Carry;Sit;Sleep;Stand    Examination-Participation Restrictions  Cleaning;Meal Prep;Community Activity;Driving;[facility]    Stability/Clinical Decision Making  Stable/Uncomplicated    Clinical Decision Making  Low    Rehab Potential  Good    PT Frequency  1x / week    PT Duration  8 weeks    PT Treatment/Interventions  ADLs/Self Care Home Management;Aquatic Therapy;Electrical Stimulation;Cryotherapy;Moist Heat;Neuromuscular re-education;Balance training;Therapeutic exercise;Therapeutic activities;Functional mobility training;Stair training;Gait training;Patient/family education;Manual techniques;Scar mobilization;Passive range of motion;Dry needling;Taping;Joint Manipulations;Spinal Manipulations    PT Next Visit Plan  lumbar heat on NuStep/bikef/u on SL TrA, static trunk core challenges in sitting on ball/dynadisc and with UE movements, gentle myofascial lumbar, DN lumbar and bil hips if Pt ok with this (discussed at eval)    PT Home Exercise Plan  Access Code: [email or web address]    Consulted and Agree with Plan of Care  Patient       Patient will benefit from skilled therapeutic intervention in order to improve the following deficits and impairments:  Decreased range of motion, Abnormal gait, Increased fascial  restricitons, Pain, Decreased strength, Postural dysfunction, Impaired  flexibility, Improper body mechanics, Hypomobility, Decreased activity tolerance, Decreased mobility, Increased muscle spasms  Visit Diagnosis: Chronic midline low back pain with bilateral sciatica - Plan: PT plan of care cert/re-cert  Muscle weakness (generalized) - Plan: PT plan of care cert/re-cert  Abnormal posture - Plan: PT plan of care cert/re-cert     Problem List Patient Active Problem List   Diagnosis Date Noted  . Status post total right knee replacement 08/08/2019  . Pelvic pain in female 08/26/2017  . Pelvic peritoneal adhesions, female 08/26/2017  . Endometriosis of pelvis 08/26/2017  . Unilateral primary osteoarthritis, right knee 06/09/2017  . Unilateral primary osteoarthritis, left knee 06/09/2017  . Family history of breast cancer in mother 12/06/2015  . Menorrhagia 05/31/2013  . Dysmenorrhea 05/31/2013  . Dyspareunia 05/31/2013  . Asthma 08/05/2011    Baruch Merl, PT 11/09/19 1:12 PM   [facility] [address] [place], [place], [address] Phone: [phone]   Fax:  [phone]  Name: Libni Buttacavoli MRN: [id number] Date of Birth: 10/30/1980

## 2019-11-16 ENCOUNTER — Encounter: Payer: Self-pay | Admitting: Physical Therapy

## 2019-11-16 ENCOUNTER — Other Ambulatory Visit: Payer: Self-pay

## 2019-11-16 ENCOUNTER — Ambulatory Visit: Payer: Medicaid Other | Attending: Physician Assistant | Admitting: Physical Therapy

## 2019-11-16 DIAGNOSIS — M5442 Lumbago with sciatica, left side: Secondary | ICD-10-CM | POA: Diagnosis present

## 2019-11-16 DIAGNOSIS — R293 Abnormal posture: Secondary | ICD-10-CM | POA: Insufficient documentation

## 2019-11-16 DIAGNOSIS — G8929 Other chronic pain: Secondary | ICD-10-CM | POA: Insufficient documentation

## 2019-11-16 DIAGNOSIS — M545 Low back pain, unspecified: Secondary | ICD-10-CM

## 2019-11-16 DIAGNOSIS — M25561 Pain in right knee: Secondary | ICD-10-CM | POA: Diagnosis present

## 2019-11-16 DIAGNOSIS — M6281 Muscle weakness (generalized): Secondary | ICD-10-CM

## 2019-11-16 DIAGNOSIS — M5441 Lumbago with sciatica, right side: Secondary | ICD-10-CM | POA: Insufficient documentation

## 2019-11-16 NOTE — Therapy (Signed)
[facility] [address], [place], [place], [address] Phone: [phone]   Fax:  [phone]  Physical Therapy Treatment  Patient Details  Name: [name] [name] MRN: [id number] Date of Birth: 1981/04/13 Referring Provider (PT): Traci Sermon, [place]   Encounter Date: 11/16/2019  PT End of Session - 11/16/19 0929    Visit Number  2    Number of Visits  4    Date for PT Re-Evaluation  12/06/19   3 visits authorized 11/16/19-12/06/19   Authorization Type  Medicaid    Authorization Time Period  submitting for auth for first 3 visits    Authorization - Visit Number  2    Authorization - Number of Visits  4    PT Start Time  0925    PT Stop Time  1023    PT Time Calculation (min)  58 min    Activity Tolerance  Patient tolerated treatment well    Behavior During Therapy  [facility] for tasks assessed/performed       Past Medical History:  Diagnosis Date  . Allergy   . Arthritis    right knee  . Asthma   . Constipation   . Depression   . GERD (gastroesophageal reflux disease)   . History of kidney stones   . Panic disorder   . Pneumonia   . Renal disorder   . Seasonal allergies   . SVD (spontaneous vaginal delivery)    x 3    Past Surgical History:  Procedure Laterality Date  . ABDOMINAL HYSTERECTOMY    . adiana    . BILATERAL SALPINGECTOMY Bilateral 05/31/2013   Procedure: BILATERAL SALPINGECTOMY;  Surgeon: Cheri Fowler, MD;  Location: [facility];  Service: Gynecology;  Laterality: Bilateral;  . BREAST CYST ASPIRATION Right 2015  . COLONOSCOPY    . DILATION AND CURETTAGE OF UTERUS    . DNC    . KIDNEY STONE SURGERY    . KNEE ARTHROSCOPY Right   . KNEE ARTHROSCOPY WITH DRILLING/MICROFRACTURE Right 07/26/2014   Procedure: RIGHT KNEE ARTHROSCOPY WITH CHONDROPLASTY, MICROFRACTURE;  Surgeon: Marianna Payment, MD;  Location: [facility];  Service: Orthopedics;  Laterality: Right;  . KNEE  ARTHROSCOPY WITH MEDIAL MENISECTOMY Right 07/26/2014   Procedure: KNEE ARTHROSCOPY WITH MEDIAL MENISECTOMY;  Surgeon: Marianna Payment, MD;  Location: [facility];  Service: Orthopedics;  Laterality: Right;  . LAPAROSCOPIC ASSISTED VAGINAL HYSTERECTOMY N/A 05/31/2013   Procedure: LAPAROSCOPIC ASSISTED VAGINAL HYSTERECTOMY;  Surgeon: Cheri Fowler, MD;  Location: [facility];  Service: Gynecology;  Laterality: N/A;  . LAPAROSCOPY N/A 08/26/2017   Procedure: LAPAROSCOPY OPERATIVE WITH PERIOTONEAL BIOPSY AND FULGERATION OF ENDOMETRIOSIS;  Surgeon: Cheri Fowler, MD;  Location: [facility];  Service: Gynecology;  Laterality: N/A;  . LUMBAR LAMINECTOMY/DECOMPRESSION MICRODISCECTOMY Right 12/31/2018   Procedure: MICRODISCECTOMY LUMBAR 5- SACRAL1;  Surgeon: Consuella Lose, MD;  Location: [facility];  Service: Neurosurgery;  Laterality: Right;  MICRODISCECTOMY LUMBAR 5- SACRAL1  . PARTIAL HYSTERECTOMY    . TONSILLECTOMY    . TONSILLECTOMY AND ADENOIDECTOMY    . TOTAL KNEE ARTHROPLASTY Right 08/08/2019  . TOTAL KNEE ARTHROPLASTY Right 08/08/2019   Procedure: RIGHT TOTAL KNEE ARTHROPLASTY;  Surgeon: Leandrew Koyanagi, MD;  Location: [facility];  Service: Orthopedics;  Laterality: Right;  . TUBAL LIGATION    . UPPER GI ENDOSCOPY     ulcer    There were no vitals filed for this visit.  Subjective Assessment - 11/16/19 [id number]  Subjective  My back feel tight this AM. Didn't sleep too well last night. I bought a lumbar pillow and this has helped alot.    Pertinent History  lumbar surgery    Currently in Pain?  Yes    Pain Score  2     Pain Location  Back    Pain Orientation  Lower    Pain Descriptors / Indicators  Stabbing    Multiple Pain Sites  No                       [facility] Adult PT Treatment/Exercise - 11/16/19 0001      Self-Care   Self-Care  ADL's    ADL's  Lumbar protective body mechanics education demo & verbal: pt verbally understands principles.       Lumbar Exercises:  Aerobic   Nustep  L3 x 8 min with PTA  present organizing HEP      Lumbar Exercises: Standing   Other Standing Lumbar Exercises  Bil standing hip abduction 10x added to HEP: VC to initiate with core      Lumbar Exercises: Supine   Ab Set  --   Supine, sidelying, seated 5x each: added to HEP   Clam  10 reps    Heel Slides  10 reps   Bil   Bent Knee Raise  10 reps   added to HEP     Electrical Stimulation   Electrical Stimulation Location  Bil lumbar paraspinals    Electrical Stimulation Action  IFC in hooklying    Electrical Stimulation Parameters  80-150 HZ    Electrical Stimulation Goals  Tone             PT Education - 11/16/19 0946    Education Details  HEP for lumbar    Person(s) Educated  Patient    Methods  Explanation;Demonstration;Verbal cues;Handout    Comprehension  Returned demonstration;Verbalized understanding       PT Short Term Goals - 11/09/19 1306      PT SHORT TERM GOAL #1   Title  Pt will demo A/ROM for lumbar flexion to at least 50 deg.    Time  4    Period  Weeks    Status  New    Target Date  12/07/19      PT SHORT TERM GOAL #2   Title  Pt will demo LE flexibility for hips to WNL to reduce undue strain of hip flexors, quads, gluteals and piriformis on lumbar spine.    Time  4    Period  Weeks    Status  New    Target Date  12/07/19        PT Long Term Goals - 11/09/19 1255      PT LONG TERM GOAL #1   Title  Pt will be able to demo proper core use with functional transfers including bed mobility, sit to stand, functional squat, and lift 10# with pain not to exceed 3/10.    Baseline  core strength 3/5 with pain up to 8/10 with these activities, poor body mechanics and limited mobility for squat    Time  8    Period  Weeks    Status  New    Target Date  01/04/20      PT LONG TERM GOAL #2   Title  Pt will be ind with advanced HEP for improved core strength, LE strength, and functional task training to reduce pain and function  for  daily tasks.    Baseline  no knowledge    Time  8    Period  Weeks    Status  New    Target Date  01/04/20      PT LONG TERM GOAL #3   Title  Pt will tolerate standing and sitting tasks for up to 1 hour with pain not to exceed 3/10.    Baseline  can stand 30 min, sit 1 hour with pain rating 6+/10    Time  8    Period  Weeks    Status  New    Target Date  01/04/20      PT LONG TERM GOAL #4   Title  Pt will report </= 2 sleep disruptions due to low back pain consistently throughout the week.    Baseline  turns from side to side up to 8-10 times a night due to pain    Time  8    Period  Weeks    Status  New    Target Date  01/04/20      PT LONG TERM GOAL #5   Title  Pt will achieve core and LE strength rating of at least 4+/5 to return to full activities.    Baseline  strength ranges from 3-4/5, core 3/5    Time  8    Period  Weeks    Status  New    Target Date  01/04/20            Plan - 11/16/19 0929    Clinical Impression Statement  Initiated HEP for multi-position TvA initiation and hip strength. Pt had no pain complaints of pain with these exercises. Pt was able to perfrom a good lower abdominal contraction with very minor initiation at upper traps which pt could abolish once PTA pointed it out to her. Attempted ESTIM at the end of her session to reduce resting tone of bil lumbar paraspinals.    Personal Factors and Comorbidities  Comorbidity 1    Comorbidities  lumbar microdiscectomy June 2020, Rt TKR Feb 2021 (therapy at this facility)    Examination-Activity Limitations  Transfers;Locomotion Level;Bed Mobility;Bend;Squat;Lift;Carry;Sit;Sleep;Stand    Examination-Participation Restrictions  Cleaning;Meal Prep;Community Activity;Driving;[facility]alla Leaver [facility]    Stability/Clinical Decision Making  Stable/Uncomplicated    Rehab Potential  Good    PT Frequency  1x / week    PT Duration  8 weeks    PT Treatment/Interventions  ADLs/Self Care Home Management;Aquatic  Therapy;Electrical Stimulation;Cryotherapy;Moist Heat;Neuromuscular re-education;Balance training;Therapeutic exercise;Therapeutic activities;Functional mobility training;Stair training;Gait training;Patient/family education;Manual techniques;Scar mobilization;Passive range of motion;Dry needling;Taping;Joint Manipulations;Spinal Manipulations    PT Next Visit Plan  Review HEP and progress as pt is 1x week. DN if seen by PT.    PT Home Exercise Plan  Access Code: [email or web address]    Consulted and Agree with Plan of Care  Patient       Patient will benefit from skilled therapeutic intervention in order to improve the following deficits and impairments:  Decreased range of motion, Abnormal gait, Increased fascial restricitons, Pain, Decreased strength, Postural dysfunction, Impaired flexibility, Improper body mechanics, Hypomobility, Decreased activity tolerance, Decreased mobility, Increased muscle spasms  Visit Diagnosis: Chronic midline low back pain with bilateral sciatica  Muscle weakness (generalized)  Abnormal posture  Acute pain of right knee  Chronic bilateral low back pain, unspecified whether sciatica present     Problem List Patient Active Problem List   Diagnosis Date Noted  . Status post total right knee replacement 08/08/2019  .  Pelvic pain in female 08/26/2017  . Pelvic peritoneal adhesions, female 08/26/2017  . Endometriosis of pelvis 08/26/2017  . Unilateral primary osteoarthritis, right knee 06/09/2017  . Unilateral primary osteoarthritis, left knee 06/09/2017  . Family history of breast cancer in mother 12/06/2015  . Menorrhagia 05/31/2013  . Dysmenorrhea 05/31/2013  . Dyspareunia 05/31/2013  . Asthma 08/05/2011    Sherine Cortese, PTA 11/16/2019, 10:16 AM  [facility] [address], [place], [place], [address] Phone: [phone]   Fax:  [phone]  Name: Jenson Kilic MRN: [id number] Date  of Birth: [birth year]  Access Code: [email or web address]: 05/12/2021Prepared by: Anderson Malta CochranExercises  Sidelying Transversus Abdominis Bracing - 1 x daily - 7 x weekly - 2 sets - 10 reps - 10 hold  Supine Transversus Abdominis Bracing - Hands on Stomach - 2 x daily - 7 x weekly - 2 sets - 10 reps - 5 hold  Supine Transversus Abdominis Bracing with Double Leg Fallout - 2 x daily - 7 x weekly - 2 sets - 10 reps  Supine Transversus Abdominis Bracing with Heel Slide - 2 x daily - 7 x weekly - 2 sets - 10 reps  Supine March - 2 x daily - 7 x weekly - 2 sets - 10 reps

## 2019-11-21 ENCOUNTER — Encounter: Payer: Medicaid Other | Admitting: Physical Therapy

## 2019-11-23 ENCOUNTER — Encounter: Payer: Self-pay | Admitting: Physical Therapy

## 2019-11-23 ENCOUNTER — Other Ambulatory Visit: Payer: Self-pay

## 2019-11-23 ENCOUNTER — Ambulatory Visit: Payer: Medicaid Other | Admitting: Physical Therapy

## 2019-11-23 DIAGNOSIS — M6281 Muscle weakness (generalized): Secondary | ICD-10-CM

## 2019-11-23 DIAGNOSIS — M5441 Lumbago with sciatica, right side: Secondary | ICD-10-CM

## 2019-11-23 DIAGNOSIS — R293 Abnormal posture: Secondary | ICD-10-CM

## 2019-11-23 DIAGNOSIS — G8929 Other chronic pain: Secondary | ICD-10-CM

## 2019-11-23 NOTE — Therapy (Signed)
[facility] [address], [place], [place], [address] Phone: [phone]   Fax:  [phone]  Physical Therapy Treatment  Patient Details  Name: [name] [name] MRN: [id number] Date of Birth: Jun 21, 1981 Referring Provider (PT): Traci Sermon, [place]   Encounter Date: 11/23/2019  PT End of Session - 11/23/19 1018    Visit Number  3    Number of Visits  4    Date for PT Re-Evaluation  12/06/19    Authorization Type  Medicaid    Authorization - Visit Number  3    Authorization - Number of Visits  4    PT Start Time  0850    PT Stop Time  0933    PT Time Calculation (min)  43 min    Activity Tolerance  Patient tolerated treatment well    Behavior During Therapy  [facility] for tasks assessed/performed       Past Medical History:  Diagnosis Date  . Allergy   . Arthritis    right knee  . Asthma   . Constipation   . Depression   . GERD (gastroesophageal reflux disease)   . History of kidney stones   . Panic disorder   . Pneumonia   . Renal disorder   . Seasonal allergies   . SVD (spontaneous vaginal delivery)    x 3    Past Surgical History:  Procedure Laterality Date  . ABDOMINAL HYSTERECTOMY    . adiana    . BILATERAL SALPINGECTOMY Bilateral 05/31/2013   Procedure: BILATERAL SALPINGECTOMY;  Surgeon: Cheri Fowler, MD;  Location: [facility];  Service: Gynecology;  Laterality: Bilateral;  . BREAST CYST ASPIRATION Right 2015  . COLONOSCOPY    . DILATION AND CURETTAGE OF UTERUS    . DNC    . KIDNEY STONE SURGERY    . KNEE ARTHROSCOPY Right   . KNEE ARTHROSCOPY WITH DRILLING/MICROFRACTURE Right 07/26/2014   Procedure: RIGHT KNEE ARTHROSCOPY WITH CHONDROPLASTY, MICROFRACTURE;  Surgeon: Marianna Payment, MD;  Location: [place];  Service: Orthopedics;  Laterality: Right;  . KNEE ARTHROSCOPY WITH MEDIAL MENISECTOMY Right 07/26/2014   Procedure: KNEE ARTHROSCOPY WITH MEDIAL MENISECTOMY;   Surgeon: Marianna Payment, MD;  Location: [place];  Service: Orthopedics;  Laterality: Right;  . LAPAROSCOPIC ASSISTED VAGINAL HYSTERECTOMY N/A 05/31/2013   Procedure: LAPAROSCOPIC ASSISTED VAGINAL HYSTERECTOMY;  Surgeon: Cheri Fowler, MD;  Location: [facility];  Service: Gynecology;  Laterality: N/A;  . LAPAROSCOPY N/A 08/26/2017   Procedure: LAPAROSCOPY OPERATIVE WITH PERIOTONEAL BIOPSY AND FULGERATION OF ENDOMETRIOSIS;  Surgeon: Cheri Fowler, MD;  Location: [facility];  Service: Gynecology;  Laterality: N/A;  . LUMBAR LAMINECTOMY/DECOMPRESSION MICRODISCECTOMY Right 12/31/2018   Procedure: MICRODISCECTOMY LUMBAR 5- SACRAL1;  Surgeon: Consuella Lose, MD;  Location: [place];  Service: Neurosurgery;  Laterality: Right;  MICRODISCECTOMY LUMBAR 5- SACRAL1  . PARTIAL HYSTERECTOMY    . TONSILLECTOMY    . TONSILLECTOMY AND ADENOIDECTOMY    . TOTAL KNEE ARTHROPLASTY Right 08/08/2019  . TOTAL KNEE ARTHROPLASTY Right 08/08/2019   Procedure: RIGHT TOTAL KNEE ARTHROPLASTY;  Surgeon: Leandrew Koyanagi, MD;  Location: [place];  Service: Orthopedics;  Laterality: Right;  . TUBAL LIGATION    . UPPER GI ENDOSCOPY     ulcer    There were no vitals filed for this visit.  Subjective Assessment - 11/23/19 0851    Subjective  Pain reached 8/10 after standing to cook past two nights.  Interrupted my sleep.  It's back  to a nag today.  Very tight.  I'm scared to move around for fear of a lock up.  I talked to my spine doctor yesterday via phone visit and we are looking for an MRI approval.    Pertinent History  lumbar surgery    How long can you sit comfortably?  1 hour, heat helps, supported back helps    How long can you stand comfortably?  30 min    How long can you walk comfortably?  1 hour    Diagnostic tests  MRI, multi-level disc degeneration    Patient Stated Goals  now driving up to 1 hour, errands x 40-45 min at a time,    Currently in Pain?  Yes    Pain Score  4     Pain Location  Back     Pain Descriptors / Indicators  Tightness;Nagging;Aching;Sore    Pain Type  Chronic pain    Pain Radiating Towards  no leg pain this week    Pain Onset  More than a month ago    Pain Frequency  Intermittent    Aggravating Factors   standing to cook, bend/lift/squat    Pain Relieving Factors  heat, sitting on ball    Effect of Pain on Daily Activities  cooking, household tasks                        [facility] Adult PT Treatment/Exercise - 11/23/19 0001      Self-Care   Self-Care  Posture    Posture  standing alignment with correction of rounded/IR shoulders and lack of core use      Neuro Re-ed    Neuro Re-ed Details   "kegel" cue works best for Pt's TrA recruitment, otherwise splints upper abs and diaphragm with "pull in your stomach" cue      Lumbar Exercises: Standing   Functional Squats  10 reps    Functional Squats Limitations  TC and VC for Kegel/TrA, hip hinge, inhale on squat, exhale on stand    Lifting Limitations  simulation of bending/lifting tasks x 5 reps, unweighted, in stagger stance for kitchen tasks      Lumbar Exercises: Quadruped   Madcat/Old Horse  10 reps    Madcat/Old Horse Limitations  gentle, after manual therapy, neutral to rounding only (no sag)      Manual Therapy   Manual Therapy  Soft tissue mobilization;Myofascial release    Soft tissue mobilization  bil lumbar and thoracic paraspinals, min progressing to mod pressure with upward elongation focus    Myofascial Release  mid-lower t-spine bil upward elongation       Trigger Point Dry Needling - 11/23/19 0001    Consent Given?  Yes    Education Handout Provided  Yes    Muscles Treated Back/Hip  Lumbar multifidi    Dry Needling Comments  bil L4-S1    Lumbar multifidi Response  Twitch response elicited;Palpable increased muscle length             PT Short Term Goals - 11/09/19 1306      PT SHORT TERM GOAL #1   Title  Pt will demo A/ROM for lumbar flexion to at least 50 deg.     Time  4    Period  Weeks    Status  New    Target Date  12/07/19      PT SHORT TERM GOAL #2   Title  Pt will demo LE flexibility  for hips to WNL to reduce undue strain of hip flexors, quads, gluteals and piriformis on lumbar spine.    Time  4    Period  Weeks    Status  New    Target Date  12/07/19        PT Long Term Goals - 11/23/19 1024      PT LONG TERM GOAL #1   Title  Pt will be able to demo proper core use with functional transfers including bed mobility, sit to stand, functional squat, and lift 10# with pain not to exceed 3/10.    Baseline  working on Economist and awareness of core canister with dynamic movement    Status  On-going      PT LONG TERM GOAL #2   Title  Pt will be ind with advanced HEP for improved core strength, LE strength, and functional task training to reduce pain and function for daily tasks.    Baseline  progressing with gentle spine ROM and core/hip strength    Status  On-going      PT LONG TERM GOAL #3   Title  Pt will tolerate standing and sitting tasks for up to 1 hour with pain not to exceed 3/10.    Baseline  can stand 30 min, sit 1 hour with pain rating 6+/10    Status  On-going      PT LONG TERM GOAL #4   Title  Pt will report </= 2 sleep disruptions due to low back pain consistently throughout the week.    Status  On-going      PT LONG TERM GOAL #5   Title  Pt will achieve core and LE strength rating of at least 4+/5 to return to full activities.    Baseline  strength ranges from 3-4/5, core 3/5, improving awareness and recruitment    Status  On-going            Plan - 11/23/19 1018    Clinical Impression Statement  Pt arrived with 4/10 pain having experienced 8/10 after cooking past 2 nights.  Sleep has been signif interrupted by pain last night.  PT performed lumbar multifidi DN which simulated sciatic symptoms during release.  PT also addressed increased tone and myofascial restrictions in lower and mid thoracic spine  which provided relief to lumbar symptoms.  Pt was able to tol gentle cat/cow from neutral to rounding and demo'd ability to perform trunk flexion hands to below knees end of session.  PT worked with Pt on core with dynamic postures for kitchen task simulation for bending/squatting/lifting simulation.  Pt needed max VC/TC intially but improved with repeated reps.  "Kegel" cue was best for Pt to recruit TrA today to avoid splinting upper abdominals.  Re-eval for Medicaid next visit.    Comorbidities  lumbar microdiscectomy June 2020, Rt TKR Feb 2021 (therapy at this facility)    Examination-Activity Limitations  Transfers;Locomotion Level;Bed Mobility;Bend;Squat;Lift;Carry;Sit;Sleep;Stand    Stability/Clinical Decision Making  Stable/Uncomplicated    Clinical Decision Making  Low    Rehab Potential  Good    PT Frequency  1x / week    PT Duration  8 weeks    PT Treatment/Interventions  ADLs/Self Care Home Management;Aquatic Therapy;Electrical Stimulation;Cryotherapy;Moist Heat;Neuromuscular re-education;Balance training;Therapeutic exercise;Therapeutic activities;Functional mobility training;Stair training;Gait training;Patient/family education;Manual techniques;Scar mobilization;Passive range of motion;Dry needling;Taping;Joint Manipulations;Spinal Manipulations    PT Next Visit Plan  MEDICAID RE-EVAL NEXT, review LTGs, review dynamic sit to stand/squat and stagger stance squat with "kegel" cue for  TrA, progress core, thoracic elongation STM bil, gentle spine ROM    PT Home Exercise Plan  Access Code: [email or web address]    Consulted and Agree with Plan of Care  Patient       Patient will benefit from skilled therapeutic intervention in order to improve the following deficits and impairments:  Decreased range of motion, Abnormal gait, Increased fascial restricitons, Pain, Decreased strength, Postural dysfunction, Impaired flexibility, Improper body mechanics, Hypomobility, Decreased activity tolerance, Decreased  mobility, Increased muscle spasms  Visit Diagnosis: Chronic midline low back pain with bilateral sciatica  Muscle weakness (generalized)  Abnormal posture  Chronic bilateral low back pain, unspecified whether sciatica present     Problem List Patient Active Problem List   Diagnosis Date Noted  . Status post total right knee replacement 08/08/2019  . Pelvic pain in female 08/26/2017  . Pelvic peritoneal adhesions, female 08/26/2017  . Endometriosis of pelvis 08/26/2017  . Unilateral primary osteoarthritis, right knee 06/09/2017  . Unilateral primary osteoarthritis, left knee 06/09/2017  . Family history of breast cancer in mother 12/06/2015  . Menorrhagia 05/31/2013  . Dysmenorrhea 05/31/2013  . Dyspareunia 05/31/2013  . Asthma 08/05/2011    Baruch Merl, PT 11/23/19 10:26 AM   [facility] [address] [place], [place], [address] Phone: [phone]   Fax:  [phone]  Name: Bernyce Punches MRN: [id number] Date of Birth: October 09, 1980

## 2019-11-28 ENCOUNTER — Encounter: Payer: Medicaid Other | Admitting: Physical Therapy

## 2019-11-30 ENCOUNTER — Encounter: Payer: Self-pay | Admitting: Physical Therapy

## 2019-11-30 ENCOUNTER — Ambulatory Visit: Payer: Medicaid Other | Admitting: Physical Therapy

## 2019-11-30 ENCOUNTER — Other Ambulatory Visit: Payer: Self-pay

## 2019-11-30 DIAGNOSIS — M5441 Lumbago with sciatica, right side: Secondary | ICD-10-CM | POA: Diagnosis not present

## 2019-11-30 DIAGNOSIS — M6281 Muscle weakness (generalized): Secondary | ICD-10-CM

## 2019-11-30 DIAGNOSIS — G8929 Other chronic pain: Secondary | ICD-10-CM

## 2019-11-30 DIAGNOSIS — R293 Abnormal posture: Secondary | ICD-10-CM

## 2019-11-30 DIAGNOSIS — M25561 Pain in right knee: Secondary | ICD-10-CM

## 2019-11-30 NOTE — Therapy (Signed)
[facility] [address], [place], [place], [address] Phone: [phone]   Fax:  [phone]  Physical Therapy Treatment  Patient Details  Name: [name] [name] MRN: [id number] Date of Birth: 03/12/1981 Referring Provider (PT): Traci Sermon, [place]   Encounter Date: 11/30/2019  PT End of Session - 11/30/19 0853    Visit Number  4    Number of Visits  4    Date for PT Re-Evaluation  12/06/19    Authorization Type  Medicaid    Authorization Time Period  submitting for auth for first 3 visits    Authorization - Visit Number  4    Authorization - Number of Visits  4    PT Start Time  0845    PT Stop Time  0930    PT Time Calculation (min)  45 min    Activity Tolerance  Patient tolerated treatment well    Behavior During Therapy  [facility] for tasks assessed/performed       Past Medical History:  Diagnosis Date  . Allergy   . Arthritis    right knee  . Asthma   . Constipation   . Depression   . GERD (gastroesophageal reflux disease)   . History of kidney stones   . Panic disorder   . Pneumonia   . Renal disorder   . Seasonal allergies   . SVD (spontaneous vaginal delivery)    x 3    Past Surgical History:  Procedure Laterality Date  . ABDOMINAL HYSTERECTOMY    . adiana    . BILATERAL SALPINGECTOMY Bilateral 05/31/2013   Procedure: BILATERAL SALPINGECTOMY;  Surgeon: Cheri Fowler, MD;  Location: [facility];  Service: Gynecology;  Laterality: Bilateral;  . BREAST CYST ASPIRATION Right 2015  . COLONOSCOPY    . DILATION AND CURETTAGE OF UTERUS    . DNC    . KIDNEY STONE SURGERY    . KNEE ARTHROSCOPY Right   . KNEE ARTHROSCOPY WITH DRILLING/MICROFRACTURE Right 07/26/2014   Procedure: RIGHT KNEE ARTHROSCOPY WITH CHONDROPLASTY, MICROFRACTURE;  Surgeon: Marianna Payment, MD;  Location: [place];  Service: Orthopedics;  Laterality: Right;  . KNEE ARTHROSCOPY WITH MEDIAL MENISECTOMY Right  07/26/2014   Procedure: KNEE ARTHROSCOPY WITH MEDIAL MENISECTOMY;  Surgeon: Marianna Payment, MD;  Location: [place];  Service: Orthopedics;  Laterality: Right;  . LAPAROSCOPIC ASSISTED VAGINAL HYSTERECTOMY N/A 05/31/2013   Procedure: LAPAROSCOPIC ASSISTED VAGINAL HYSTERECTOMY;  Surgeon: Cheri Fowler, MD;  Location: [facility];  Service: Gynecology;  Laterality: N/A;  . LAPAROSCOPY N/A 08/26/2017   Procedure: LAPAROSCOPY OPERATIVE WITH PERIOTONEAL BIOPSY AND FULGERATION OF ENDOMETRIOSIS;  Surgeon: Cheri Fowler, MD;  Location: [facility];  Service: Gynecology;  Laterality: N/A;  . LUMBAR LAMINECTOMY/DECOMPRESSION MICRODISCECTOMY Right 12/31/2018   Procedure: MICRODISCECTOMY LUMBAR 5- SACRAL1;  Surgeon: Consuella Lose, MD;  Location: [place];  Service: Neurosurgery;  Laterality: Right;  MICRODISCECTOMY LUMBAR 5- SACRAL1  . PARTIAL HYSTERECTOMY    . TONSILLECTOMY    . TONSILLECTOMY AND ADENOIDECTOMY    . TOTAL KNEE ARTHROPLASTY Right 08/08/2019  . TOTAL KNEE ARTHROPLASTY Right 08/08/2019   Procedure: RIGHT TOTAL KNEE ARTHROPLASTY;  Surgeon: Leandrew Koyanagi, MD;  Location: [place];  Service: Orthopedics;  Laterality: Right;  . TUBAL LIGATION    . UPPER GI ENDOSCOPY     ulcer    There were no vitals filed for this visit.  Subjective Assessment - 11/30/19 0855    Subjective  My pain is  typically worse standing still, washing dishes. Having MRI next week. I am afraid to move in certain ways because I don't want it to lock on me.    Pertinent History  lumbar surgery    Currently in Pain?  No/denies   Just a little tightness   Multiple Pain Sites  No         [facility] PT Assessment - 11/30/19 0001      Assessment   Medical Diagnosis  M54.5 (ICD-10-CM) - Low back pain    Referring Provider (PT)  Traci Sermon, PA-C    Onset Date/Surgical Date  11/21/19    Hand Dominance  Right      Strength   Strength Assessment Site  Hip    Right/Left Hip  Right;Left    Right Hip Flexion   4/5    Right Hip Extension  4-/5    Right Hip External Rotation   3+/5    Right Hip Internal Rotation  4/5    Right Hip ABduction  4-/5    Right Hip ADduction  4+/5    Left Hip Flexion  4/5    Left Hip Extension  4-/5    Left Hip External Rotation  4/5    Left Hip Internal Rotation  4/5    Left Hip ABduction  4/5    Left Hip ADduction  4+/5    Right Knee Flexion  4/5    Right Knee Extension  4/5                    [facility] Adult PT Treatment/Exercise - 11/30/19 0001      Lumbar Exercises: Aerobic   Stationary Bike  L1 x 10 min with concurrent review of goals      Lumbar Exercises: Standing   Row  Strengthening;Both;20 reps;Theraband    Row Limitations  VC/TC to reduce lordosis and increased lower ab activation    Shoulder Extension  Strengthening;Both;20 reps;Theraband    Theraband Level (Shoulder Extension)  Level 2 (Red)    Shoulder Extension Limitations  VC for pelvic alignment               PT Short Term Goals - 11/09/19 1306      PT SHORT TERM GOAL #1   Title  Pt will demo A/ROM for lumbar flexion to at least 50 deg.    Time  4    Period  Weeks    Status  New    Target Date  12/07/19      PT SHORT TERM GOAL #2   Title  Pt will demo LE flexibility for hips to WNL to reduce undue strain of hip flexors, quads, gluteals and piriformis on lumbar spine.    Time  4    Period  Weeks    Status  New    Target Date  12/07/19        PT Long Term Goals - 11/30/19 0858      PT LONG TERM GOAL #1   Title  Pt will be able to demo proper core use with functional transfers including bed mobility, sit to stand, functional squat, and lift 10# with pain not to exceed 3/10.    Time  8    Period  Weeks    Status  Partially Met   Bed mobility no pain with pillow b/t knees: standing up improved pain 2-3/10, pt doesn't bend d/t pain ( getting laundry out)     PT LONG TERM GOAL #  2   Title  Pt will be ind with advanced HEP for improved core strength, LE strength,  and functional task training to reduce pain and function for daily tasks.    Baseline  progressing with gentle spine ROM and core/hip strength    Time  8    Period  Weeks    Status  On-going      PT LONG TERM GOAL #3   Title  Pt will tolerate standing and sitting tasks for up to 1 hour with pain not to exceed 3/10.    Time  8    Period  Weeks    Status  Partially Met   Sitting 2 hours then needs to stretch, pain can range but on avg 6/10: standing 1 hour with movement/rocking, if no movement 20 min with 6-7/10 pain     PT LONG TERM GOAL #4   Title  Pt will report </= 2 sleep disruptions due to low back pain consistently throughout the week.    Time  8    Period  Weeks    Status  Achieved   2x on avergae, pillows help a lot           Plan - 11/30/19 8366    Clinical Impression Statement  Pt presents today without back pain. She reports most pain is when she stands still like when doing dishes or forward bending like when getting her laundry out. She has been educated in lumbar protective body mechanics for her ADLs which she thinks helps her pain but reports"geting tired" while she "tried to stand taller." pt showing improvements in standing with more a neutral pelvis, needs some reminding but not constant as pt is learning. MMT shows some improvements in hip strength. Goals partially met at this time. Most improvement is sleeping at night, she only wakes up ( at most) 2x during the night becasue of pain. Previously was 8x.    Personal Factors and Comorbidities  Comorbidity 1    Comorbidities  lumbar microdiscectomy June 2020, Rt TKR Feb 2021 (therapy at this facility)    Examination-Activity Limitations  Transfers;Locomotion Level;Bed Mobility;Bend;Squat;Lift;Carry;Sit;Sleep;Stand    Examination-Participation Restrictions  Cleaning;Meal Prep;Community Activity;Driving;[facility]    Stability/Clinical Decision Making  Stable/Uncomplicated    Rehab Potential  Good    PT  Frequency  1x / week    PT Duration  8 weeks    PT Treatment/Interventions  ADLs/Self Care Home Management;Aquatic Therapy;Electrical Stimulation;Cryotherapy;Moist Heat;Neuromuscular re-education;Balance training;Therapeutic exercise;Therapeutic activities;Functional mobility training;Stair training;Gait training;Patient/family education;Manual techniques;Scar mobilization;Passive range of motion;Dry needling;Taping;Joint Manipulations;Spinal Manipulations    PT Next Visit Plan  MRI next week, follow up with this and if she has discussed results with MD. Await insurance approval.    PT Home Exercise Plan  Access Code: [email or web address]    Consulted and Agree with Plan of Care  Patient       Patient will benefit from skilled therapeutic intervention in order to improve the following deficits and impairments:  Decreased range of motion, Abnormal gait, Increased fascial restricitons, Pain, Decreased strength, Postural dysfunction, Impaired flexibility, Improper body mechanics, Hypomobility, Decreased activity tolerance, Decreased mobility, Increased muscle spasms  Visit Diagnosis: Chronic midline low back pain with bilateral sciatica  Muscle weakness (generalized)  Abnormal posture  Chronic bilateral low back pain, unspecified whether sciatica present  Acute pain of right knee     Problem List Patient Active Problem List   Diagnosis Date Noted  . Status post total right knee replacement 08/08/2019  . Pelvic  pain in female 08/26/2017  . Pelvic peritoneal adhesions, female 08/26/2017  . Endometriosis of pelvis 08/26/2017  . Unilateral primary osteoarthritis, right knee 06/09/2017  . Unilateral primary osteoarthritis, left knee 06/09/2017  . Family history of breast cancer in mother 12/06/2015  . Menorrhagia 05/31/2013  . Dysmenorrhea 05/31/2013  . Dyspareunia 05/31/2013  . Asthma 08/05/2011    COCHRAN,JENNIFER, PTA 11/30/2019, 1:55 PM  [facility] [address] [place], [place], [address] Phone: [phone]   Fax:  [phone]  Name: [name] [name] MRN: [id number] Date of Birth: January 25, 1981

## 2019-12-07 ENCOUNTER — Encounter: Payer: Medicaid Other | Admitting: Physical Therapy

## 2019-12-09 ENCOUNTER — Encounter: Payer: Medicaid Other | Admitting: Physical Therapy

## 2019-12-12 ENCOUNTER — Encounter: Payer: Medicaid Other | Admitting: Physical Therapy

## 2019-12-14 ENCOUNTER — Ambulatory Visit: Payer: Medicaid Other | Admitting: Physical Therapy

## 2019-12-21 ENCOUNTER — Ambulatory Visit: Payer: Medicaid Other | Attending: Physician Assistant | Admitting: Physical Therapy

## 2019-12-21 ENCOUNTER — Other Ambulatory Visit: Payer: Self-pay

## 2019-12-21 ENCOUNTER — Encounter: Payer: Self-pay | Admitting: Physical Therapy

## 2019-12-21 DIAGNOSIS — M5442 Lumbago with sciatica, left side: Secondary | ICD-10-CM | POA: Diagnosis present

## 2019-12-21 DIAGNOSIS — M545 Low back pain, unspecified: Secondary | ICD-10-CM

## 2019-12-21 DIAGNOSIS — M6281 Muscle weakness (generalized): Secondary | ICD-10-CM | POA: Diagnosis present

## 2019-12-21 DIAGNOSIS — G8929 Other chronic pain: Secondary | ICD-10-CM

## 2019-12-21 DIAGNOSIS — M5441 Lumbago with sciatica, right side: Secondary | ICD-10-CM | POA: Diagnosis present

## 2019-12-21 DIAGNOSIS — R293 Abnormal posture: Secondary | ICD-10-CM | POA: Insufficient documentation

## 2019-12-21 NOTE — Therapy (Signed)
[facility] [address] [place], [place], [address] Phone: [phone]   Fax:  [phone]  Physical Therapy Treatment  Patient Details  Name: [name] [name] MRN: [id number] Date of Birth: February 20, 1981 Referring Provider (PT): Traci Sermon, [place]   Encounter Date: 12/21/2019   PT End of Session - 12/21/19 0857    Visit Number 5    Number of Visits 5    Date for PT Re-Evaluation 01/04/20    Authorization Type Medicaid    Authorization Time Period 5 visits approved 6/9-7/27    Authorization - Visit Number 5    Authorization - Number of Visits 9    PT Start Time 0845    PT Stop Time 0925    PT Time Calculation (min) 40 min    Activity Tolerance Patient tolerated treatment well    Behavior During Therapy [facility] for tasks assessed/performed           Past Medical History:  Diagnosis Date  . Allergy   . Arthritis    right knee  . Asthma   . Constipation   . Depression   . GERD (gastroesophageal reflux disease)   . History of kidney stones   . Panic disorder   . Pneumonia   . Renal disorder   . Seasonal allergies   . SVD (spontaneous vaginal delivery)    x 3    Past Surgical History:  Procedure Laterality Date  . ABDOMINAL HYSTERECTOMY    . [place]    . BILATERAL SALPINGECTOMY Bilateral 05/31/2013   Procedure: BILATERAL SALPINGECTOMY;  Surgeon: Cheri Fowler, MD;  Location: [facility];  Service: Gynecology;  Laterality: Bilateral;  . BREAST CYST ASPIRATION Right 2015  . COLONOSCOPY    . DILATION AND CURETTAGE OF UTERUS    . DNC    . KIDNEY STONE SURGERY    . KNEE ARTHROSCOPY Right   . KNEE ARTHROSCOPY WITH DRILLING/MICROFRACTURE Right 07/26/2014   Procedure: RIGHT KNEE ARTHROSCOPY WITH CHONDROPLASTY, MICROFRACTURE;  Surgeon: Marianna Payment, MD;  Location: [place];  Service: Orthopedics;  Laterality: Right;  . KNEE ARTHROSCOPY WITH MEDIAL MENISECTOMY Right 07/26/2014    Procedure: KNEE ARTHROSCOPY WITH MEDIAL MENISECTOMY;  Surgeon: Marianna Payment, MD;  Location: [place];  Service: Orthopedics;  Laterality: Right;  . LAPAROSCOPIC ASSISTED VAGINAL HYSTERECTOMY N/A 05/31/2013   Procedure: LAPAROSCOPIC ASSISTED VAGINAL HYSTERECTOMY;  Surgeon: Cheri Fowler, MD;  Location: [facility];  Service: Gynecology;  Laterality: N/A;  . LAPAROSCOPY N/A 08/26/2017   Procedure: LAPAROSCOPY OPERATIVE WITH PERIOTONEAL BIOPSY AND FULGERATION OF ENDOMETRIOSIS;  Surgeon: Cheri Fowler, MD;  Location: [facility];  Service: Gynecology;  Laterality: N/A;  . LUMBAR LAMINECTOMY/DECOMPRESSION MICRODISCECTOMY Right 12/31/2018   Procedure: MICRODISCECTOMY LUMBAR 5- SACRAL1;  Surgeon: Consuella Lose, MD;  Location: [place];  Service: Neurosurgery;  Laterality: Right;  MICRODISCECTOMY LUMBAR 5- SACRAL1  . PARTIAL HYSTERECTOMY    . TONSILLECTOMY    . TONSILLECTOMY AND ADENOIDECTOMY    . TOTAL KNEE ARTHROPLASTY Right 08/08/2019  . TOTAL KNEE ARTHROPLASTY Right 08/08/2019   Procedure: RIGHT TOTAL KNEE ARTHROPLASTY;  Surgeon: Leandrew Koyanagi, MD;  Location: [place];  Service: Orthopedics;  Laterality: Right;  . TUBAL LIGATION    . UPPER GI ENDOSCOPY     ulcer    There were no vitals filed for this visit.   Subjective Assessment - 12/21/19 0848    Subjective I had my MRI report and doctor visit.  I am bone on  bone and MD recommends fusion for that segment for whenever I'm ready.  He thought I should hold off as long as possible but I'm thinking I might go for it b/c I'm in so much pain.    Pertinent History lumbar surgery    Limitations Sitting;Standing    How long can you sit comfortably? 1 hour, heat helps, supported back helps    How long can you stand comfortably? 30 min    How long can you walk comfortably? 1 hour    Diagnostic tests MRI, multi-level disc degeneration    Patient Stated Goals now driving up to 1 hour, errands x 40-45 min at a time,    Currently in Pain? Yes     Pain Location Back    Pain Orientation Left;Lower;Mid    Pain Descriptors / Indicators Tightness;Throbbing;Aching    Pain Type Chronic pain;Acute pain    Pain Onset More than a month ago    Pain Frequency Constant    Aggravating Factors  everything, sleep very distrupted    Pain Relieving Factors heat, sitting on ball, use of leg pillow    Effect of Pain on Daily Activities cooking, household tasks              [facility] PT Assessment - 12/21/19 0001      Assessment   Medical Diagnosis M54.5 (ICD-10-CM) - Low back pain    Referring Provider (PT) Traci Sermon, PA-C    Onset Date/Surgical Date 11/21/19    Hand Dominance Right                         [facility] Adult PT Treatment/Exercise - 12/21/19 0001      Self-Care   Self-Care Other Self-Care Comments    Other Self-Care Comments  pre-op and post-op PT expectations, walking program      Lumbar Exercises: Standing   Other Standing Lumbar Exercises wall plank 5x10 sec, counter plank 5x10 sec      Lumbar Exercises: Seated   Other Seated Lumbar Exercises foam roller press 5x5 sec holds      Lumbar Exercises: Quadruped   Other Quadruped Lumbar Exercises TrA 5x5      Knee/Hip Exercises: Sidelying   Clams red band bil x 10                    PT Short Term Goals - 12/21/19 0858      PT SHORT TERM GOAL #1   Title Pt will demo A/ROM for lumbar flexion to at least 50 deg.    Status Partially Met      PT SHORT TERM GOAL #2   Title Pt will demo LE flexibility for hips to WNL to reduce undue strain of hip flexors, quads, gluteals and piriformis on lumbar spine.    Status Partially Met             PT Long Term Goals - 12/21/19 0859      PT LONG TERM GOAL #1   Title Pt will be able to demo proper core use with functional transfers including bed mobility, sit to stand, functional squat, and lift 10# with pain not to exceed 3/10.    Baseline working on Economist and awareness of core  canister with dynamic movement    Status Partially Met      PT LONG TERM GOAL #2   Title Pt will be ind with advanced HEP for improved core strength, LE  strength, and functional task training to reduce pain and function for daily tasks.    Baseline progressing with gentle spine ROM and core/hip strength    Status Partially Met      PT LONG TERM GOAL #3   Title Pt will tolerate standing and sitting tasks for up to 1 hour with pain not to exceed 3/10.    Baseline can stand 30 min, sit 1 hour with pain rating 6+/10    Status Not Met      PT LONG TERM GOAL #4   Title Pt will report </= 2 sleep disruptions due to low back pain consistently throughout the week.    Status Achieved      PT LONG TERM GOAL #5   Title Pt will achieve core and LE strength rating of at least 4+/5 to return to full activities.    Baseline strength ranges from 3-4/5, core 3/5, improving awareness and recruitment    Status Partially Met                 Plan - 12/21/19 0914    Clinical Impression Statement Pt is likely going to have lumbar fusion surgery soon.  She only has 4 more PT visits for this year so PT and Pt discussed her plan and decided collectively that Pt will be d/c'd to HEP to focus on core before surgery at home and save remaining visits for post-op.  Pt partially met or didn't meet goals due to pain levels and PT cut short.  PT reviewed options and gave new options for core within pain tolerance as she preps strength for surgery today.  D/C to HEP today with follow up if recommended by surgeon post-operatively.    Comorbidities lumbar microdiscectomy June 2020, Rt TKR Feb 2021 (therapy at this facility)    PT Frequency 1x / week    PT Duration 8 weeks    PT Treatment/Interventions ADLs/Self Care Home Management;Aquatic Therapy;Electrical Stimulation;Cryotherapy;Moist Heat;Neuromuscular re-education;Balance training;Therapeutic exercise;Therapeutic activities;Functional mobility training;Stair  training;Gait training;Patient/family education;Manual techniques;Scar mobilization;Passive range of motion;Dry needling;Taping;Joint Manipulations;Spinal Manipulations    PT Next Visit Plan d/c to HEP for core focus pre-op to save remaining annual visits for post-op    PT Home Exercise Plan Access Code: [email or web address]    Consulted and Agree with Plan of Care Patient           Patient will benefit from skilled therapeutic intervention in order to improve the following deficits and impairments:     Visit Diagnosis: Chronic midline low back pain with bilateral sciatica  Muscle weakness (generalized)  Abnormal posture  Chronic bilateral low back pain, unspecified whether sciatica present     Problem List Patient Active Problem List   Diagnosis Date Noted  . Status post total right knee replacement 08/08/2019  . Pelvic pain in female 08/26/2017  . Pelvic peritoneal adhesions, female 08/26/2017  . Endometriosis of pelvis 08/26/2017  . Unilateral primary osteoarthritis, right knee 06/09/2017  . Unilateral primary osteoarthritis, left knee 06/09/2017  . Family history of breast cancer in mother 12/06/2015  . Menorrhagia 05/31/2013  . Dysmenorrhea 05/31/2013  . Dyspareunia 05/31/2013  . Asthma 08/05/2011    Seana Underwood [place] 12/21/2019, 9:26 AM  PHYSICAL THERAPY DISCHARGE SUMMARY  Visits from Start of Care: 5  Current functional level related to goals / functional outcomes: See above   Remaining deficits: See above   Education / Equipment: HEP Plan: Patient agrees to discharge.  Patient goals were partially met. Patient is being discharged  due to the patient's request.  ?????        Baruch Merl, PT 12/21/19 9:26 AM   [facility] [address], [place], [place], [address] Phone: [phone]   Fax:  [phone]  Name: Cayci Mcnabb MRN: [id number] Date of Birth: Mar 12, 1981

## 2019-12-23 ENCOUNTER — Encounter: Payer: Medicaid Other | Admitting: Physical Therapy

## 2019-12-26 ENCOUNTER — Encounter: Payer: Medicaid Other | Admitting: Physical Therapy

## 2019-12-26 ENCOUNTER — Other Ambulatory Visit: Payer: Self-pay | Admitting: Neurosurgery

## 2019-12-28 ENCOUNTER — Ambulatory Visit: Payer: Medicaid Other | Admitting: Physical Therapy

## 2020-01-02 ENCOUNTER — Encounter: Payer: Medicaid Other | Admitting: Physical Therapy

## 2020-01-03 ENCOUNTER — Other Ambulatory Visit: Payer: Self-pay | Admitting: Neurosurgery

## 2020-01-03 NOTE — Pre-Procedure Instructions (Signed)
[name] [name]  01/03/2020     Your procedure is scheduled on Friday, July 9th.  Report to [facility], Main Entrance or Entrance "A" at 5:30 AM                     Your surgery or procedure is scheduled for 7:30 A.M.   Call this number if you have problems the morning of surgery: [phone]  This is the number for the Pre- Surgical Desk.        For any other questions, please call [phone], Monday - Friday 8 AM - 4 PM.   Remember:  Do not eat or drink after midnight Thursday, July 8th.   Take these medicines the morning of surgery with A SIP OF WATER: busPIRone (BUSPAR) omeprazole (PRILOSEC)  Fluticasone-Salmeterol (ADVAIR)  May take if needed: methocarbamol (ROBAXIN) albuterol (VENTOLIN HFA) inhaler, please bring the inhaler to the hospital with you.  Day of Surgery   Do not wear jewelry, make-up or nail polish.  Do not wear lotions, powders, or perfumes, or deodorant.  Do not shave 48 hours prior to surgery.    Do not bring valuables to the hospital.  [facility] is not responsible for any belongings or valuables.  Contacts, dentures or bridgework may not be worn into surgery.  Leave your suitcase in the car.  After surgery it may be brought to your room.  For patients admitted to the hospital, discharge time will be determined by your treatment team.  Patients discharged the day of surgery will not be allowed to drive home.   Special instructions:  [facility]- Preparing For Surgery  Before surgery, you can play an important role. Because skin is not sterile, your skin needs to be as free of germs as possible. You can reduce the number of germs on your skin by washing with CHG (chlorahexidine gluconate) Soap before surgery.  CHG is an antiseptic cleaner which kills germs and bonds with the skin to continue killing germs even after washing.    Oral Hygiene is also important to reduce your risk of infection.  Remember - BRUSH YOUR TEETH THE  MORNING OF SURGERY WITH YOUR REGULAR TOOTHPASTE  Please do not use if you have an allergy to CHG or antibacterial soaps. If your skin becomes reddened/irritated stop using the CHG.  Do not shave (including legs and underarms) for at least 48 hours prior to first CHG shower. It is OK to shave your face.  Please follow these instructions carefully.   1. Shower the NIGHT BEFORE SURGERY and the MORNING OF SURGERY with CHG.   2. If you chose to wash your hair, wash your hair first as usual with your normal shampoo.  3. After you shampoo, rinse your hair and body thoroughly to remove the shampoo.  4. Use CHG as you would any other liquid soap. You can apply CHG directly to the skin and wash gently with a scrungie or a clean washcloth.   5. Apply the CHG Soap to your body ONLY FROM THE NECK DOWN.  Do not use on open wounds or open sores. Avoid contact with your eyes, ears, mouth and genitals (private parts). Wash Face and genitals (private parts)  with your normal soap.  6. Wash thoroughly, paying special attention to the area where your surgery will be performed.  7. Thoroughly rinse your body with warm water from the neck down.  8. DO NOT shower/wash with your normal soap after  using and rinsing off the CHG Soap.  9. Pat yourself dry with a CLEAN TOWEL.  10. Wear CLEAN PAJAMAS to bed the night before surgery, wear comfortable clothes the morning of surgery  11. Place CLEAN SHEETS on your bed the night of your first shower and DO NOT SLEEP WITH PETS.  Day of Surgery: Shower as instructed above. Do not apply any deodorants/lotions.  Please wear clean clothes to the hospital/surgery center.   Remember to brush your teeth WITH YOUR REGULAR TOOTHPASTE.  Please read over the  fact sheets that you were given.

## 2020-01-04 ENCOUNTER — Ambulatory Visit: Payer: Medicaid Other | Admitting: Physical Therapy

## 2020-01-04 ENCOUNTER — Encounter (HOSPITAL_COMMUNITY): Payer: Self-pay

## 2020-01-04 ENCOUNTER — Encounter (HOSPITAL_COMMUNITY)
Admission: RE | Admit: 2020-01-04 | Discharge: 2020-01-04 | Disposition: A | Discharge status: Home / Self Care | Payer: Medicaid Other | Source: Ambulatory Visit | Attending: Neurosurgery | Admitting: Neurosurgery

## 2020-01-04 ENCOUNTER — Other Ambulatory Visit: Payer: Self-pay

## 2020-01-04 DIAGNOSIS — Z01812 Encounter for preprocedural laboratory examination: Secondary | ICD-10-CM | POA: Insufficient documentation

## 2020-01-04 LAB — TYPE AND SCREEN
ABO/RH(D): A POS
Antibody Screen: NEGATIVE

## 2020-01-04 LAB — CBC
HCT: 40.1 % (ref 36.0–46.0)
Hemoglobin: 13.3 g/dL (ref 12.0–15.0)
MCH: 31 pg (ref 26.0–34.0)
MCHC: 33.2 g/dL (ref 30.0–36.0)
MCV: 93.5 fL (ref 80.0–100.0)
Platelets: 246 10*3/uL (ref 150–400)
RBC: 4.29 MIL/uL (ref 3.87–5.11)
RDW: 12.3 % (ref 11.5–15.5)
WBC: 6.4 10*3/uL (ref 4.0–10.5)
nRBC: 0 % (ref 0.0–0.2)

## 2020-01-04 LAB — SURGICAL PCR SCREEN
MRSA, PCR: NEGATIVE
Staphylococcus aureus: POSITIVE — AB

## 2020-01-04 NOTE — Pre-Procedure Instructions (Signed)
[name] [name]  01/04/2020     Your procedure is scheduled on Friday, July 9th.  Report to [facility], Main Entrance or Entrance "A" at 5:30 AM                     Your surgery or procedure is scheduled for 7:30 A.M.   Call this number if you have problems the morning of surgery: [phone]  This is the number for the Pre- Surgical Desk.        For any other questions, please call [phone], Monday - Friday 8 AM - 4 PM.   Remember:  Do not eat or drink after midnight Thursday, July 8th.   Take these medicines the morning of surgery with A SIP OF WATER: busPIRone (BUSPAR) omeprazole (PRILOSEC)  Fluticasone-Salmeterol (ADVAIR)  May take if needed: methocarbamol (ROBAXIN) albuterol (VENTOLIN HFA) inhaler, please bring the inhaler to the hospital with you.  Day of Surgery   Do not wear jewelry, make-up or nail polish.  Do not wear lotions, powders, or perfumes, or deodorant.  Do not shave 48 hours prior to surgery.    Do not bring valuables to the hospital.  [facility] is not responsible for any belongings or valuables.  Contacts, dentures or bridgework may not be worn into surgery.  Leave your suitcase in the car.  After surgery it may be brought to your room.  For patients admitted to the hospital, discharge time will be determined by your treatment team.  Patients discharged the day of surgery will not be allowed to drive home.   Special instructions:  [facility]- Preparing For Surgery  Before surgery, you can play an important role. Because skin is not sterile, your skin needs to be as free of germs as possible. You can reduce the number of germs on your skin by washing with CHG (chlorahexidine gluconate) Soap before surgery.  CHG is an antiseptic cleaner which kills germs and bonds with the skin to continue killing germs even after washing.    Oral Hygiene is also important to reduce your risk of infection.  Remember - BRUSH YOUR TEETH THE  MORNING OF SURGERY WITH YOUR REGULAR TOOTHPASTE  Please do not use if you have an allergy to CHG or antibacterial soaps. If your skin becomes reddened/irritated stop using the CHG.  Do not shave (including legs and underarms) for at least 48 hours prior to first CHG shower. It is OK to shave your face.  Please follow these instructions carefully.   1. Shower the NIGHT BEFORE SURGERY and the MORNING OF SURGERY with CHG.   2. If you chose to wash your hair, wash your hair first as usual with your normal shampoo.  3. After you shampoo, rinse your hair and body thoroughly to remove the shampoo.  4. Use CHG as you would any other liquid soap. You can apply CHG directly to the skin and wash gently with a scrungie or a clean washcloth.   5. Apply the CHG Soap to your body ONLY FROM THE NECK DOWN.  Do not use on open wounds or open sores. Avoid contact with your eyes, ears, mouth and genitals (private parts). Wash Face and genitals (private parts)  with your normal soap.  6. Wash thoroughly, paying special attention to the area where your surgery will be performed.  7. Thoroughly rinse your body with warm water from the neck down.  8. DO NOT shower/wash with your normal soap after  using and rinsing off the CHG Soap.  9. Pat yourself dry with a CLEAN TOWEL.  10. Wear CLEAN PAJAMAS to bed the night before surgery, wear comfortable clothes the morning of surgery  11. Place CLEAN SHEETS on your bed the night of your first shower and DO NOT SLEEP WITH PETS.  Day of Surgery: Shower as instructed above. Do not apply any deodorants/lotions.  Please wear clean clothes to the hospital/surgery center.   Remember to brush your teeth WITH YOUR REGULAR TOOTHPASTE.  Please read over the  fact sheets that you were given.

## 2020-01-04 NOTE — Progress Notes (Signed)
PCP:  [facility] Cardiologist:  Denies  EKG:  08/04/19 CXR:  08/04/19 ECHO:  Denies Stress Test:  Denies Cardiac Cath:  Denies  Covid test 01/11/20   Patient denies shortness of breath, fever, cough, and chest pain at PAT appointment.  Patient verbalized understanding of instructions provided today at the PAT appointment.  Patient asked to review instructions at home and day of surgery.

## 2020-01-11 ENCOUNTER — Ambulatory Visit: Payer: Medicaid Other | Admitting: Physical Therapy

## 2020-01-11 ENCOUNTER — Other Ambulatory Visit (HOSPITAL_COMMUNITY): Payer: Medicaid Other

## 2020-01-18 ENCOUNTER — Encounter: Payer: Medicaid Other | Admitting: Physical Therapy

## 2020-01-18 ENCOUNTER — Other Ambulatory Visit (HOSPITAL_COMMUNITY)
Admission: RE | Admit: 2020-01-18 | Discharge: 2020-01-18 | Disposition: A | Discharge status: Home / Self Care | Payer: Medicaid Other | Source: Ambulatory Visit | Attending: Neurosurgery | Admitting: Neurosurgery

## 2020-01-18 DIAGNOSIS — Z01812 Encounter for preprocedural laboratory examination: Secondary | ICD-10-CM | POA: Diagnosis not present

## 2020-01-18 DIAGNOSIS — Z20822 Contact with and (suspected) exposure to covid-19: Secondary | ICD-10-CM | POA: Diagnosis not present

## 2020-01-18 LAB — SARS CORONAVIRUS 2 (TAT 6-24 HRS): SARS Coronavirus 2: NEGATIVE

## 2020-01-19 ENCOUNTER — Encounter (HOSPITAL_COMMUNITY): Payer: Self-pay | Admitting: Neurosurgery

## 2020-01-19 ENCOUNTER — Other Ambulatory Visit: Payer: Self-pay

## 2020-01-19 NOTE — Anesthesia Preprocedure Evaluation (Addendum)
Anesthesia Evaluation  Patient identified by MRN, date of birth, ID band Patient awake    Reviewed: Allergy & Precautions, NPO status , Patient's Chart, lab work & pertinent test results  History of Anesthesia Complications Negative for: history of anesthetic complications  Airway Mallampati: II  TM Distance: >3 FB Neck ROM: Full    Dental  (+) Dental Advisory Given, Chipped,    Pulmonary asthma , pneumonia, COPD,  COPD inhaler, Patient abstained from smoking.,  12/28/2018 SARS coronavirus NEG   Pulmonary exam normal breath sounds clear to auscultation       Cardiovascular negative cardio ROS Normal cardiovascular exam Rhythm:Regular Rate:Normal     Neuro/Psych PSYCHIATRIC DISORDERS Anxiety Depression Chronic back and leg pain: tramadol    GI/Hepatic Neg liver ROS, GERD  Medicated and Controlled,  Endo/Other  negative endocrine ROS  Renal/GU Renal disease  negative genitourinary   Musculoskeletal  (+) Arthritis , Osteoarthritis,    Abdominal   Peds  Hematology negative hematology ROS (+) plt 285, hct 41   Anesthesia Other Findings   Reproductive/Obstetrics negative OB ROS S/p BTL                            Anesthesia Physical  Anesthesia Plan  ASA: II  Anesthesia Plan: General   Post-op Pain Management:    Induction:   PONV Risk Score and Plan: 4 or greater and Scopolamine patch - Pre-op, Dexamethasone, Ondansetron, Treatment may vary due to age or medical condition and Midazolam  Airway Management Planned: Oral ETT  Additional Equipment: None  Intra-op Plan:   Post-operative Plan: Extubation in OR  Informed Consent: I have reviewed the patients History and Physical, chart, labs and discussed the procedure including the risks, benefits and alternatives for the proposed anesthesia with the patient or authorized representative who has indicated his/her understanding and  acceptance.     Dental advisory given  Plan Discussed with: CRNA  Anesthesia Plan Comments:        Anesthesia Quick Evaluation

## 2020-01-19 NOTE — Progress Notes (Signed)
[name] [name] denies chest pain or shortness of breath. Patient tested negative for Covid 7/14 and has been in quarantine since that time.

## 2020-01-20 ENCOUNTER — Inpatient Hospital Stay (HOSPITAL_COMMUNITY): Payer: Medicaid Other

## 2020-01-20 ENCOUNTER — Encounter (HOSPITAL_COMMUNITY): Payer: Self-pay | Admitting: Neurosurgery

## 2020-01-20 ENCOUNTER — Inpatient Hospital Stay (HOSPITAL_COMMUNITY): Payer: Medicaid Other | Admitting: Certified Registered Nurse Anesthetist

## 2020-01-20 ENCOUNTER — Encounter (HOSPITAL_COMMUNITY)
Admission: RE | Disposition: A | Discharge status: Home / Self Care | Payer: Self-pay | Source: Home / Self Care | Attending: Neurosurgery

## 2020-01-20 ENCOUNTER — Inpatient Hospital Stay (HOSPITAL_COMMUNITY)
Admission: RE | Admit: 2020-01-20 | Discharge: 2020-01-21 | DRG: 460 | Disposition: A | Discharge status: Home / Self Care | Payer: Medicaid Other | Attending: Neurosurgery | Admitting: Neurosurgery

## 2020-01-20 DIAGNOSIS — Z885 Allergy status to narcotic agent status: Secondary | ICD-10-CM

## 2020-01-20 DIAGNOSIS — Z888 Allergy status to other drugs, medicaments and biological substances status: Secondary | ICD-10-CM

## 2020-01-20 DIAGNOSIS — J45909 Unspecified asthma, uncomplicated: Secondary | ICD-10-CM | POA: Diagnosis present

## 2020-01-20 DIAGNOSIS — Z8249 Family history of ischemic heart disease and other diseases of the circulatory system: Secondary | ICD-10-CM

## 2020-01-20 DIAGNOSIS — Z419 Encounter for procedure for purposes other than remedying health state, unspecified: Secondary | ICD-10-CM

## 2020-01-20 DIAGNOSIS — Z79899 Other long term (current) drug therapy: Secondary | ICD-10-CM

## 2020-01-20 DIAGNOSIS — Z803 Family history of malignant neoplasm of breast: Secondary | ICD-10-CM

## 2020-01-20 DIAGNOSIS — M5416 Radiculopathy, lumbar region: Secondary | ICD-10-CM | POA: Diagnosis present

## 2020-01-20 DIAGNOSIS — Z886 Allergy status to analgesic agent status: Secondary | ICD-10-CM

## 2020-01-20 DIAGNOSIS — Z96651 Presence of right artificial knee joint: Secondary | ICD-10-CM | POA: Diagnosis present

## 2020-01-20 DIAGNOSIS — M4727 Other spondylosis with radiculopathy, lumbosacral region: Principal | ICD-10-CM | POA: Diagnosis present

## 2020-01-20 DIAGNOSIS — F329 Major depressive disorder, single episode, unspecified: Secondary | ICD-10-CM | POA: Diagnosis present

## 2020-01-20 DIAGNOSIS — M549 Dorsalgia, unspecified: Secondary | ICD-10-CM | POA: Diagnosis present

## 2020-01-20 DIAGNOSIS — F410 Panic disorder [episodic paroxysmal anxiety] without agoraphobia: Secondary | ICD-10-CM | POA: Diagnosis present

## 2020-01-20 DIAGNOSIS — K219 Gastro-esophageal reflux disease without esophagitis: Secondary | ICD-10-CM | POA: Diagnosis present

## 2020-01-20 DIAGNOSIS — M4317 Spondylolisthesis, lumbosacral region: Secondary | ICD-10-CM | POA: Diagnosis present

## 2020-01-20 DIAGNOSIS — Z833 Family history of diabetes mellitus: Secondary | ICD-10-CM | POA: Diagnosis not present

## 2020-01-20 DIAGNOSIS — Z800 Family history of malignant neoplasm of digestive organs: Secondary | ICD-10-CM

## 2020-01-20 LAB — CBC
HCT: 40.2 % (ref 36.0–46.0)
Hemoglobin: 13.1 g/dL (ref 12.0–15.0)
MCH: 30.8 pg (ref 26.0–34.0)
MCHC: 32.6 g/dL (ref 30.0–36.0)
MCV: 94.6 fL (ref 80.0–100.0)
Platelets: 234 10*3/uL (ref 150–400)
RBC: 4.25 MIL/uL (ref 3.87–5.11)
RDW: 12.2 % (ref 11.5–15.5)
WBC: 5.7 10*3/uL (ref 4.0–10.5)
nRBC: 0 % (ref 0.0–0.2)

## 2020-01-20 LAB — TYPE AND SCREEN
ABO/RH(D): A POS
Antibody Screen: NEGATIVE

## 2020-01-20 SURGERY — POSTERIOR LUMBAR FUSION 1 LEVEL
Anesthesia: General | Site: Spine Lumbar

## 2020-01-20 MED ORDER — SODIUM CHLORIDE 0.9% FLUSH: 3.0000 mL | INTRAVENOUS | Status: DC | PRN

## 2020-01-20 MED ORDER — HYDROMORPHONE HCL 1 MG/ML IJ SOLN
INTRAMUSCULAR | Status: AC
  Filled 2020-01-20: qty 1

## 2020-01-20 MED ORDER — SODIUM CHLORIDE 0.9 % IV SOLN
INTRAVENOUS | Status: DC | PRN
  Administered 2020-01-20: 500 mL

## 2020-01-20 MED ORDER — FENTANYL CITRATE (PF) 250 MCG/5ML IJ SOLN
INTRAMUSCULAR | Status: DC | PRN
  Administered 2020-01-20 (×2): 50 ug via INTRAVENOUS
  Administered 2020-01-20: 100 ug via INTRAVENOUS
  Administered 2020-01-20: 50 ug via INTRAVENOUS

## 2020-01-20 MED ORDER — METHOCARBAMOL 500 MG PO TABS
500.0000 mg | ORAL_TABLET | Freq: Four times a day (QID) | ORAL | Status: DC | PRN
  Administered 2020-01-20 – 2020-01-21 (×2): 500 mg via ORAL
  Filled 2020-01-20 (×2): qty 1

## 2020-01-20 MED ORDER — PHENYLEPHRINE 40 MCG/ML (10ML) SYRINGE FOR IV PUSH (FOR BLOOD PRESSURE SUPPORT)
PREFILLED_SYRINGE | INTRAVENOUS | Status: DC | PRN
  Administered 2020-01-20 (×2): 80 ug via INTRAVENOUS

## 2020-01-20 MED ORDER — MEPERIDINE HCL 25 MG/ML IJ SOLN: 6.2500 mg | INTRAMUSCULAR | Status: DC | PRN

## 2020-01-20 MED ORDER — LIDOCAINE 2% (20 MG/ML) 5 ML SYRINGE
INTRAMUSCULAR | Status: AC
  Filled 2020-01-20: qty 5

## 2020-01-20 MED ORDER — ONDANSETRON HCL 4 MG PO TABS: 4.0000 mg | ORAL_TABLET | Freq: Four times a day (QID) | ORAL | Status: DC | PRN

## 2020-01-20 MED ORDER — HYDROMORPHONE HCL 1 MG/ML IJ SOLN
0.2500 mg | INTRAMUSCULAR | Status: DC | PRN
  Administered 2020-01-20 (×2): 0.5 mg via INTRAVENOUS

## 2020-01-20 MED ORDER — OXYCODONE HCL 5 MG PO TABS
5.0000 mg | ORAL_TABLET | ORAL | Status: DC | PRN
  Administered 2020-01-20 (×3): 10 mg via ORAL
  Filled 2020-01-20 (×5): qty 2

## 2020-01-20 MED ORDER — SODIUM CHLORIDE 0.9% FLUSH: 3.0000 mL | Freq: Two times a day (BID) | INTRAVENOUS | Status: DC

## 2020-01-20 MED ORDER — METHOCARBAMOL 1000 MG/10ML IJ SOLN
500.0000 mg | Freq: Four times a day (QID) | INTRAVENOUS | Status: DC | PRN
  Filled 2020-01-20: qty 5

## 2020-01-20 MED ORDER — HYDROCODONE-ACETAMINOPHEN 5-325 MG PO TABS: 1.0000 | ORAL_TABLET | ORAL | Status: DC | PRN

## 2020-01-20 MED ORDER — FENTANYL CITRATE (PF) 250 MCG/5ML IJ SOLN
INTRAMUSCULAR | Status: AC
  Filled 2020-01-20: qty 5

## 2020-01-20 MED ORDER — SODIUM CHLORIDE 0.9 % IV SOLN: INTRAVENOUS | Status: DC

## 2020-01-20 MED ORDER — CHLORHEXIDINE GLUCONATE CLOTH 2 % EX PADS: 6.0000 | MEDICATED_PAD | Freq: Once | CUTANEOUS | Status: DC

## 2020-01-20 MED ORDER — BUSPIRONE HCL 10 MG PO TABS
10.0000 mg | ORAL_TABLET | Freq: Three times a day (TID) | ORAL | Status: DC
  Administered 2020-01-20 (×2): 10 mg via ORAL
  Filled 2020-01-20 (×3): qty 1

## 2020-01-20 MED ORDER — ROCURONIUM BROMIDE 10 MG/ML (PF) SYRINGE
PREFILLED_SYRINGE | INTRAVENOUS | Status: AC
  Filled 2020-01-20: qty 10

## 2020-01-20 MED ORDER — MIDAZOLAM HCL 2 MG/2ML IJ SOLN
INTRAMUSCULAR | Status: AC
  Filled 2020-01-20: qty 2

## 2020-01-20 MED ORDER — PHENYLEPHRINE HCL-NACL 10-0.9 MG/250ML-% IV SOLN
INTRAVENOUS | Status: DC | PRN
Start: 2020-01-20 — End: 2020-01-20
  Administered 2020-01-20: 15 ug/min via INTRAVENOUS

## 2020-01-20 MED ORDER — LIDOCAINE-EPINEPHRINE 1 %-1:100000 IJ SOLN
INTRAMUSCULAR | Status: AC
  Filled 2020-01-20: qty 1

## 2020-01-20 MED ORDER — OXYCODONE HCL 5 MG PO TABS
5.0000 mg | ORAL_TABLET | ORAL | Status: DC | PRN
  Administered 2020-01-20 – 2020-01-21 (×3): 10 mg via ORAL
  Filled 2020-01-20: qty 2

## 2020-01-20 MED ORDER — ACETAMINOPHEN 650 MG RE SUPP: 650.0000 mg | RECTAL | Status: DC | PRN

## 2020-01-20 MED ORDER — LIDOCAINE-EPINEPHRINE 1 %-1:100000 IJ SOLN
INTRAMUSCULAR | Status: DC | PRN
  Administered 2020-01-20: 5 mL

## 2020-01-20 MED ORDER — HYDROMORPHONE HCL 1 MG/ML IJ SOLN
INTRAMUSCULAR | Status: DC | PRN
  Administered 2020-01-20 (×2): .5 mg via INTRAVENOUS

## 2020-01-20 MED ORDER — DEXMEDETOMIDINE (PRECEDEX) IN NS 20 MCG/5ML (4 MCG/ML) IV SYRINGE
PREFILLED_SYRINGE | INTRAVENOUS | Status: DC | PRN
  Administered 2020-01-20 (×5): 4 ug via INTRAVENOUS

## 2020-01-20 MED ORDER — HYDROMORPHONE HCL 1 MG/ML IJ SOLN
INTRAMUSCULAR | Status: AC
  Filled 2020-01-20: qty 0.5

## 2020-01-20 MED ORDER — PROMETHAZINE HCL 25 MG/ML IJ SOLN
6.2500 mg | INTRAMUSCULAR | Status: DC | PRN
  Administered 2020-01-20: 6.25 mg via INTRAVENOUS

## 2020-01-20 MED ORDER — SENNA 8.6 MG PO TABS
1.0000 | ORAL_TABLET | Freq: Two times a day (BID) | ORAL | Status: DC
  Administered 2020-01-20 – 2020-01-21 (×3): 8.6 mg via ORAL
  Filled 2020-01-20 (×3): qty 1

## 2020-01-20 MED ORDER — ROCURONIUM BROMIDE 10 MG/ML (PF) SYRINGE
PREFILLED_SYRINGE | INTRAVENOUS | Status: DC | PRN
  Administered 2020-01-20: 60 mg via INTRAVENOUS
  Administered 2020-01-20 (×2): 20 mg via INTRAVENOUS

## 2020-01-20 MED ORDER — DEXMEDETOMIDINE (PRECEDEX) IN NS 20 MCG/5ML (4 MCG/ML) IV SYRINGE
PREFILLED_SYRINGE | INTRAVENOUS | Status: AC
  Filled 2020-01-20: qty 5

## 2020-01-20 MED ORDER — FLEET ENEMA 7-19 GM/118ML RE ENEM: 1.0000 | ENEMA | Freq: Once | RECTAL | Status: DC | PRN

## 2020-01-20 MED ORDER — SODIUM CHLORIDE 0.9 % IV SOLN: 250.0000 mL | INTRAVENOUS | Status: DC

## 2020-01-20 MED ORDER — CEFAZOLIN SODIUM-DEXTROSE 2-4 GM/100ML-% IV SOLN
2.0000 g | INTRAVENOUS | Status: AC
  Administered 2020-01-20: 2 g via INTRAVENOUS
  Filled 2020-01-20: qty 100

## 2020-01-20 MED ORDER — ORAL CARE MOUTH RINSE: 15.0000 mL | Freq: Once | OROMUCOSAL | Status: AC

## 2020-01-20 MED ORDER — PHENOL 1.4 % MT LIQD: 1.0000 | OROMUCOSAL | Status: DC | PRN

## 2020-01-20 MED ORDER — HYDROMORPHONE HCL 1 MG/ML IJ SOLN: 0.5000 mg | INTRAMUSCULAR | Status: DC | PRN

## 2020-01-20 MED ORDER — MIDAZOLAM HCL 2 MG/2ML IJ SOLN
INTRAMUSCULAR | Status: DC | PRN
  Administered 2020-01-20: 2 mg via INTRAVENOUS

## 2020-01-20 MED ORDER — LACTATED RINGERS IV SOLN: INTRAVENOUS | Status: DC

## 2020-01-20 MED ORDER — SCOPOLAMINE 1 MG/3DAYS TD PT72
MEDICATED_PATCH | TRANSDERMAL | Status: DC | PRN
  Administered 2020-01-20: 1 via TRANSDERMAL

## 2020-01-20 MED ORDER — PHENYLEPHRINE 40 MCG/ML (10ML) SYRINGE FOR IV PUSH (FOR BLOOD PRESSURE SUPPORT)
PREFILLED_SYRINGE | INTRAVENOUS | Status: AC
  Filled 2020-01-20: qty 10

## 2020-01-20 MED ORDER — LIDOCAINE 2% (20 MG/ML) 5 ML SYRINGE
INTRAMUSCULAR | Status: DC | PRN
  Administered 2020-01-20: 100 mg via INTRAVENOUS

## 2020-01-20 MED ORDER — DEXAMETHASONE SODIUM PHOSPHATE 10 MG/ML IJ SOLN
INTRAMUSCULAR | Status: DC | PRN
  Administered 2020-01-20: 10 mg via INTRAVENOUS

## 2020-01-20 MED ORDER — ACETAMINOPHEN 500 MG PO TABS
1000.0000 mg | ORAL_TABLET | Freq: Four times a day (QID) | ORAL | Status: AC
  Administered 2020-01-20 – 2020-01-21 (×4): 1000 mg via ORAL
  Filled 2020-01-20 (×4): qty 2

## 2020-01-20 MED ORDER — ONDANSETRON HCL 4 MG/2ML IJ SOLN
INTRAMUSCULAR | Status: DC | PRN
  Administered 2020-01-20: 4 mg via INTRAVENOUS

## 2020-01-20 MED ORDER — BUPIVACAINE HCL (PF) 0.5 % IJ SOLN
INTRAMUSCULAR | Status: DC | PRN
  Administered 2020-01-20: 5 mL

## 2020-01-20 MED ORDER — DEXAMETHASONE SODIUM PHOSPHATE 10 MG/ML IJ SOLN
INTRAMUSCULAR | Status: AC
  Filled 2020-01-20: qty 1

## 2020-01-20 MED ORDER — PROPOFOL 10 MG/ML IV BOLUS
INTRAVENOUS | Status: AC
  Filled 2020-01-20: qty 40

## 2020-01-20 MED ORDER — BISACODYL 10 MG RE SUPP: 10.0000 mg | Freq: Every day | RECTAL | Status: DC | PRN

## 2020-01-20 MED ORDER — DOCUSATE SODIUM 100 MG PO CAPS
100.0000 mg | ORAL_CAPSULE | Freq: Two times a day (BID) | ORAL | Status: DC
  Administered 2020-01-20 – 2020-01-21 (×3): 100 mg via ORAL
  Filled 2020-01-20 (×3): qty 1

## 2020-01-20 MED ORDER — BUPIVACAINE HCL (PF) 0.5 % IJ SOLN
INTRAMUSCULAR | Status: AC
  Filled 2020-01-20: qty 30

## 2020-01-20 MED ORDER — ONDANSETRON HCL 4 MG/2ML IJ SOLN: 4.0000 mg | Freq: Four times a day (QID) | INTRAMUSCULAR | Status: DC | PRN

## 2020-01-20 MED ORDER — THROMBIN 5000 UNITS EX SOLR
OROMUCOSAL | Status: DC | PRN
  Administered 2020-01-20: 5 mL via TOPICAL

## 2020-01-20 MED ORDER — SUGAMMADEX SODIUM 200 MG/2ML IV SOLN
INTRAVENOUS | Status: DC | PRN
  Administered 2020-01-20: 200 mg via INTRAVENOUS

## 2020-01-20 MED ORDER — CHLORHEXIDINE GLUCONATE 0.12 % MT SOLN
15.0000 mL | Freq: Once | OROMUCOSAL | Status: AC
  Administered 2020-01-20: 15 mL via OROMUCOSAL

## 2020-01-20 MED ORDER — SENNOSIDES-DOCUSATE SODIUM 8.6-50 MG PO TABS: 1.0000 | ORAL_TABLET | Freq: Every evening | ORAL | Status: DC | PRN

## 2020-01-20 MED ORDER — DIPHENHYDRAMINE HCL 25 MG PO CAPS
25.0000 mg | ORAL_CAPSULE | Freq: Four times a day (QID) | ORAL | Status: DC | PRN
  Administered 2020-01-20 – 2020-01-21 (×2): 25 mg via ORAL
  Filled 2020-01-20 (×2): qty 1

## 2020-01-20 MED ORDER — CEFAZOLIN SODIUM-DEXTROSE 2-4 GM/100ML-% IV SOLN
2.0000 g | Freq: Three times a day (TID) | INTRAVENOUS | Status: AC
  Administered 2020-01-20 (×2): 2 g via INTRAVENOUS
  Filled 2020-01-20 (×2): qty 100

## 2020-01-20 MED ORDER — PROMETHAZINE HCL 25 MG/ML IJ SOLN
INTRAMUSCULAR | Status: AC
  Filled 2020-01-20: qty 1

## 2020-01-20 MED ORDER — 0.9 % SODIUM CHLORIDE (POUR BTL) OPTIME
TOPICAL | Status: DC | PRN
  Administered 2020-01-20: 1000 mL

## 2020-01-20 MED ORDER — PROPOFOL 10 MG/ML IV BOLUS
INTRAVENOUS | Status: DC | PRN
  Administered 2020-01-20: 150 mg via INTRAVENOUS

## 2020-01-20 MED ORDER — MENTHOL 3 MG MT LOZG: 1.0000 | LOZENGE | OROMUCOSAL | Status: DC | PRN

## 2020-01-20 MED ORDER — ACETAMINOPHEN 325 MG PO TABS: 650.0000 mg | ORAL_TABLET | ORAL | Status: DC | PRN

## 2020-01-20 MED ORDER — PANTOPRAZOLE SODIUM 40 MG PO TBEC
40.0000 mg | DELAYED_RELEASE_TABLET | Freq: Every day | ORAL | Status: DC
  Administered 2020-01-20 – 2020-01-21 (×2): 40 mg via ORAL
  Filled 2020-01-20 (×2): qty 1

## 2020-01-20 MED ORDER — BUPROPION HCL ER (XL) 300 MG PO TB24
300.0000 mg | ORAL_TABLET | Freq: Every day | ORAL | Status: DC
  Administered 2020-01-20: 300 mg via ORAL
  Filled 2020-01-20: qty 1

## 2020-01-20 MED ORDER — FAMOTIDINE 20 MG PO TABS
40.0000 mg | ORAL_TABLET | Freq: Every evening | ORAL | Status: DC
  Administered 2020-01-20: 40 mg via ORAL
  Filled 2020-01-20: qty 2

## 2020-01-20 MED ORDER — THROMBIN 5000 UNITS EX SOLR
CUTANEOUS | Status: AC
  Filled 2020-01-20: qty 5000

## 2020-01-20 SURGICAL SUPPLY — 72 items
BAG DECANTER FOR FLEXI CONT (MISCELLANEOUS) ×3 IMPLANT
BASKET BONE COLLECTION (BASKET) ×3 IMPLANT
BENZOIN TINCTURE PRP APPL 2/3 (GAUZE/BANDAGES/DRESSINGS) IMPLANT
BLADE CLIPPER SURG (BLADE) IMPLANT
BLADE SURG 11 STRL SS (BLADE) ×3 IMPLANT
BUR MATCHSTICK NEURO 3.0 LAGG (BURR) ×3 IMPLANT
BUR PRECISION FLUTE 5.0 (BURR) ×3 IMPLANT
CAGE INTERBODY PL SHT 7X22.5 (Plate) ×6 IMPLANT
CANISTER SUCT 3000ML PPV (MISCELLANEOUS) ×3 IMPLANT
CARTRIDGE OIL MAESTRO DRILL (MISCELLANEOUS) ×1 IMPLANT
CLOSURE WOUND 1/2 X4 (GAUZE/BANDAGES/DRESSINGS)
CNTNR URN SCR LID CUP LEK RST (MISCELLANEOUS) ×1 IMPLANT
CONT SPEC 4OZ STRL OR WHT (MISCELLANEOUS) ×3
COVER BACK TABLE 60X90IN (DRAPES) ×3 IMPLANT
COVER WAND RF STERILE (DRAPES) IMPLANT
DECANTER SPIKE VIAL GLASS SM (MISCELLANEOUS) ×3 IMPLANT
DERMABOND ADVANCED (GAUZE/BANDAGES/DRESSINGS) ×2
DERMABOND ADVANCED .7 DNX12 (GAUZE/BANDAGES/DRESSINGS) ×1 IMPLANT
DIFFUSER DRILL AIR PNEUMATIC (MISCELLANEOUS) ×3 IMPLANT
DRAPE C-ARM 42X72 X-RAY (DRAPES) ×3 IMPLANT
DRAPE C-ARMOR (DRAPES) ×3 IMPLANT
DRAPE LAPAROTOMY 100X72X124 (DRAPES) ×3 IMPLANT
DRAPE SURG 17X23 STRL (DRAPES) ×3 IMPLANT
DRSG OPSITE POSTOP 4X6 (GAUZE/BANDAGES/DRESSINGS) ×3 IMPLANT
DURAPREP 26ML APPLICATOR (WOUND CARE) ×3 IMPLANT
ELECT REM PT RETURN 9FT ADLT (ELECTROSURGICAL) ×3
ELECTRODE REM PT RTRN 9FT ADLT (ELECTROSURGICAL) ×1 IMPLANT
GAUZE 4X4 16PLY RFD (DISPOSABLE) IMPLANT
GAUZE SPONGE 4X4 12PLY STRL (GAUZE/BANDAGES/DRESSINGS) IMPLANT
GLOVE BIO SURGEON STRL SZ7.5 (GLOVE) ×12 IMPLANT
GLOVE BIOGEL PI IND STRL 6.5 (GLOVE) ×1 IMPLANT
GLOVE BIOGEL PI IND STRL 7.5 (GLOVE) ×6 IMPLANT
GLOVE BIOGEL PI INDICATOR 6.5 (GLOVE) ×2
GLOVE BIOGEL PI INDICATOR 7.5 (GLOVE) ×12
GLOVE ECLIPSE 7.0 STRL STRAW (GLOVE) ×6 IMPLANT
GLOVE EXAM NITRILE XL STR (GLOVE) IMPLANT
GLOVE SURG SS PI 6.0 STRL IVOR (GLOVE) ×12 IMPLANT
GOWN STRL REUS W/ TWL LRG LVL3 (GOWN DISPOSABLE) ×7 IMPLANT
GOWN STRL REUS W/ TWL XL LVL3 (GOWN DISPOSABLE) IMPLANT
GOWN STRL REUS W/TWL 2XL LVL3 (GOWN DISPOSABLE) IMPLANT
GOWN STRL REUS W/TWL LRG LVL3 (GOWN DISPOSABLE) ×21
GOWN STRL REUS W/TWL XL LVL3 (GOWN DISPOSABLE)
HEMOSTAT POWDER KIT SURGIFOAM (HEMOSTASIS) ×3 IMPLANT
KIT BASIN OR (CUSTOM PROCEDURE TRAY) ×3 IMPLANT
KIT INFUSE XX SMALL 0.7CC (Orthopedic Implant) ×3 IMPLANT
KIT POSITION SURG JACKSON T1 (MISCELLANEOUS) ×3 IMPLANT
KIT TURNOVER KIT B (KITS) ×3 IMPLANT
MILL MEDIUM DISP (BLADE) ×3 IMPLANT
NEEDLE HYPO 18GX1.5 BLUNT FILL (NEEDLE) IMPLANT
NEEDLE HYPO 22GX1.5 SAFETY (NEEDLE) ×3 IMPLANT
NEEDLE SPNL 18GX3.5 QUINCKE PK (NEEDLE) ×3 IMPLANT
NS IRRIG 1000ML POUR BTL (IV SOLUTION) ×3 IMPLANT
OIL CARTRIDGE MAESTRO DRILL (MISCELLANEOUS) ×3
PACK LAMINECTOMY NEURO (CUSTOM PROCEDURE TRAY) ×3 IMPLANT
PAD ARMBOARD 7.5X6 YLW CONV (MISCELLANEOUS) ×6 IMPLANT
PASTE BONE GRAFTON 5CC (Bone Implant) ×3 IMPLANT
ROD CC 30MM (Rod) ×6 IMPLANT
SCREW 5.5X35MM (Screw) ×12 IMPLANT
SCREW BN 35X5.5XMA NS SPNE (Screw) ×4 IMPLANT
SCREW SET SOLERA (Screw) ×12 IMPLANT
SCREW SET SOLERA TI (Screw) ×4 IMPLANT
SPONGE LAP 4X18 RFD (DISPOSABLE) IMPLANT
SPONGE SURGIFOAM ABS GEL 100 (HEMOSTASIS) IMPLANT
STRIP CLOSURE SKIN 1/2X4 (GAUZE/BANDAGES/DRESSINGS) IMPLANT
SUT VIC AB 0 CT1 18XCR BRD8 (SUTURE) ×2 IMPLANT
SUT VIC AB 0 CT1 8-18 (SUTURE) ×6
SUT VICRYL 3-0 RB1 18 ABS (SUTURE) ×6 IMPLANT
SYR 3ML LL SCALE MARK (SYRINGE) ×9 IMPLANT
TOWEL GREEN STERILE (TOWEL DISPOSABLE) ×3 IMPLANT
TOWEL GREEN STERILE FF (TOWEL DISPOSABLE) ×3 IMPLANT
TRAY FOLEY MTR SLVR 16FR STAT (SET/KITS/TRAYS/PACK) ×3 IMPLANT
WATER STERILE IRR 1000ML POUR (IV SOLUTION) ×3 IMPLANT

## 2020-01-20 NOTE — H&P (Signed)
Chief Complaint   Back pain  HPI   HPI: [name] [name] is a 39 y.o. female who underwent right L5-S1 microdiskectomy for a large herniated disc nearly 1 year ago.  She was doing well immediately postop.  Unfortunately over the last several months she started to develop constant lower back pain,  With relatively severe flare ups intermittently.  A repeat MRI of her lumbar spine was ordered revealing new spondylolisthesis at L5-S1.   She presents today for lumbar decompression and fusion.  She is without any concerns.  Patient Active Problem List   Diagnosis Date Noted  . Status post total right knee replacement 08/08/2019  . Pelvic pain in female 08/26/2017  . Pelvic peritoneal adhesions, female 08/26/2017  . Endometriosis of pelvis 08/26/2017  . Unilateral primary osteoarthritis, right knee 06/09/2017  . Unilateral primary osteoarthritis, left knee 06/09/2017  . Family history of breast cancer in mother 12/06/2015  . Menorrhagia 05/31/2013  . Dysmenorrhea 05/31/2013  . Dyspareunia 05/31/2013  . Asthma 08/05/2011    PMH: Past Medical History:  Diagnosis Date  . Allergy   . Arthritis    right knee  . Asthma   . Constipation   . Depression   . GERD (gastroesophageal reflux disease)   . History of kidney stones   . Panic disorder   . Pneumonia   . Renal disorder   . Seasonal allergies   . SVD (spontaneous vaginal delivery)    x 3    PSH: Past Surgical History:  Procedure Laterality Date  . ABDOMINAL HYSTERECTOMY    . adiana    . BILATERAL SALPINGECTOMY Bilateral 05/31/2013   Procedure: BILATERAL SALPINGECTOMY;  Surgeon: Cheri Fowler, MD;  Location: [facility];  Service: Gynecology;  Laterality: Bilateral;  . BREAST CYST ASPIRATION Right 2015  . COLONOSCOPY    . DILATION AND CURETTAGE OF UTERUS    . DNC    . KIDNEY STONE SURGERY    . KNEE ARTHROSCOPY Right   . KNEE ARTHROSCOPY WITH DRILLING/MICROFRACTURE Right 07/26/2014   Procedure: RIGHT KNEE ARTHROSCOPY  WITH CHONDROPLASTY, MICROFRACTURE;  Surgeon: Marianna Payment, MD;  Location: [facility];  Service: Orthopedics;  Laterality: Right;  . KNEE ARTHROSCOPY WITH MEDIAL MENISECTOMY Right 07/26/2014   Procedure: KNEE ARTHROSCOPY WITH MEDIAL MENISECTOMY;  Surgeon: Marianna Payment, MD;  Location: [facility];  Service: Orthopedics;  Laterality: Right;  . LAPAROSCOPIC ASSISTED VAGINAL HYSTERECTOMY N/A 05/31/2013   Procedure: LAPAROSCOPIC ASSISTED VAGINAL HYSTERECTOMY;  Surgeon: Cheri Fowler, MD;  Location: [facility];  Service: Gynecology;  Laterality: N/A;  . LAPAROSCOPY N/A 08/26/2017   Procedure: LAPAROSCOPY OPERATIVE WITH PERIOTONEAL BIOPSY AND FULGERATION OF ENDOMETRIOSIS;  Surgeon: Cheri Fowler, MD;  Location: [facility];  Service: Gynecology;  Laterality: N/A;  . LUMBAR LAMINECTOMY/DECOMPRESSION MICRODISCECTOMY Right 12/31/2018   Procedure: MICRODISCECTOMY LUMBAR 5- SACRAL1;  Surgeon: Consuella Lose, MD;  Location: [facility];  Service: Neurosurgery;  Laterality: Right;  MICRODISCECTOMY LUMBAR 5- SACRAL1  . PARTIAL HYSTERECTOMY    . TONSILLECTOMY    . TONSILLECTOMY AND ADENOIDECTOMY    . TOTAL KNEE ARTHROPLASTY Right 08/08/2019  . TOTAL KNEE ARTHROPLASTY Right 08/08/2019   Procedure: RIGHT TOTAL KNEE ARTHROPLASTY;  Surgeon: Leandrew Koyanagi, MD;  Location: [facility];  Service: Orthopedics;  Laterality: Right;  . TUBAL LIGATION    . UPPER GI ENDOSCOPY     ulcer    Medications Prior to Admission  Medication Sig Dispense Refill Last Dose  . albuterol (VENTOLIN HFA) 108 (90 Base)  MCG/ACT inhaler Inhale 2 puffs into the lungs every 6 (six) hours as needed for wheezing or shortness of breath. (Patient taking differently: Inhale 2 puffs into the lungs 2 (two) times daily. Inhale 2 puffs twice daily after using advair doses, may inhale 2 puffs every 6 hours as needed for shortness of breath) 18 g 0   . buPROPion (WELLBUTRIN XL) 300 MG 24 hr tablet Take 300 mg by mouth at bedtime.      . busPIRone (BUSPAR) 10 MG tablet Take 10 mg by mouth 3 (three) times daily.      . Fluticasone-Salmeterol (ADVAIR) 500-50 MCG/DOSE AEPB Inhale 1 puff into the lungs daily.      . pantoprazole (PROTONIX) 40 MG tablet Take 40 mg by mouth daily.     . famotidine (PEPCID) 40 MG tablet Take 40 mg by mouth every evening.     . gabapentin (NEURONTIN) 100 MG capsule Take 1-3 capsules (100-300 mg total) by mouth 3 (three) times daily. (Patient not taking: Reported on 01/03/2020) 30 capsule 3 Not Taking at Unknown time  . methocarbamol (ROBAXIN) 750 MG tablet Take 1 tablet (750 mg total) by mouth 2 (two) times daily as needed for muscle spasms. 20 tablet 3     SH: Social History   Tobacco Use  . Smoking status: Never Smoker  . Smokeless tobacco: Never Used  Vaping Use  . Vaping Use: Never used  Substance Use Topics  . Alcohol use: No    Alcohol/week: 0.0 standard drinks  . Drug use: No    MEDS: Prior to Admission medications   Medication Sig Start Date End Date Taking? Authorizing Provider  albuterol (VENTOLIN HFA) 108 (90 Base) MCG/ACT inhaler Inhale 2 puffs into the lungs every 6 (six) hours as needed for wheezing or shortness of breath. Patient taking differently: Inhale 2 puffs into the lungs 2 (two) times daily. Inhale 2 puffs twice daily after using advair doses, may inhale 2 puffs every 6 hours as needed for shortness of breath 02/02/19  Yes Hassell Done, Mary-Margaret, FNP  buPROPion (WELLBUTRIN XL) 300 MG 24 hr tablet Take 300 mg by mouth at bedtime.   Yes [provider]  busPIRone (BUSPAR) 10 MG tablet Take 10 mg by mouth 3 (three) times daily.    Yes [provider]  Fluticasone-Salmeterol (ADVAIR) 500-50 MCG/DOSE AEPB Inhale 1 puff into the lungs daily.    Yes [provider]  pantoprazole (PROTONIX) 40 MG tablet Take 40 mg by mouth daily.   Yes [provider]  famotidine (PEPCID) 40 MG tablet Take 40 mg by mouth every evening.    [provider]  gabapentin (NEURONTIN) 100 MG capsule Take 1-3 capsules (100-300 mg total) by mouth 3 (three) times daily. Patient not taking: Reported on 01/03/2020 08/07/19   Leandrew Koyanagi, MD  methocarbamol (ROBAXIN) 750 MG tablet Take 1 tablet (750 mg total) by mouth 2 (two) times daily as needed for muscle spasms. 08/07/19   Leandrew Koyanagi, MD    ALLERGY: Allergies  Allergen Reactions  . Duloxetine Hcl Nausea And Vomiting  . Nitrofurantoin Monohyd Macro Hives and Shortness Of Breath  . Aspirin     Bleeding ulcer  . Morphine And Related Nausea And Vomiting    Social History   Tobacco Use  . Smoking status: Never Smoker  . Smokeless tobacco: Never Used  Substance Use Topics  . Alcohol use: No    Alcohol/week: 0.0 standard drinks     Family History  Problem Relation Age of Onset  . Hypertension Mother   . Diabetes Mother   . Breast cancer Mother 66       IDC; s/p lumpectomy, radiation, and arimidex  . Prostate cancer Paternal Grandfather        dx. 50s-60s  . Other Sister        both full sisters also have dense breast tissue  . Breast cancer Maternal Grandmother 83       IDC  . Colon polyps Maternal Grandmother        approx 4-5 colon polyps  . Pancreatic cancer Other        maternal great aunt (MGF's sister) d. pancreatic cancer  . Cancer Other        maternal great aunt (MGF's sister) dx NOS cancer  . Cancer Other        maternal great grandmother (MGF's mother) d. NOS cancer in her 72s  . Cancer Cousin 25       paternal 1st cousin dx. w/ either throat or thyroid cancer; not a smoker  . Breast cancer Other        paternal great aunt (PGF's sister) dx. breast cancer, 93s  . Colon cancer Neg Hx   . Rectal cancer Neg Hx   . Stomach cancer Neg Hx   . Esophageal cancer Neg Hx      ROS   ROS  Exam   There were no vitals filed for this visit. General appearance: WDWN, NAD Eyes: No scleral injection Cardiovascular: Regular rate and rhythm without murmurs,  rubs, gallops. No edema or variciosities. Distal pulses normal. Pulmonary: Effort normal, non-labored breathing Musculoskeletal:     Muscle tone upper extremities: Normal    Muscle tone lower extremities: Normal    Motor exam: Upper Extremities Deltoid Bicep Tricep Grip  Right 5/5 5/5 5/5 5/5  Left 5/5 5/5 5/5 5/5   Lower Extremity IP Quad PF DF EHL  Right 5/5 5/5 5/5 5/5 5/5  Left 5/5 5/5 5/5 5/5 5/5   Neurological Mental Status:    - Patient is awake, alert, oriented to person, place, month, year, and situation    - Patient is able to give a clear and coherent history.    - No signs of aphasia or neglect Cranial Nerves    - II: Visual Fields are full. PERRL    - III/IV/VI: EOMI without ptosis or diploplia.     - V: Facial sensation is grossly normal    - VII: Facial movement is symmetric.     - VIII: hearing is intact to voice    - X: Uvula elevates symmetrically    - XI: Shoulder shrug is symmetric.    - XII: tongue is midline without atrophy or fasciculations.  Sensory: Sensation grossly intact to LT  Results - Imaging/Labs   Results for orders placed or performed during the hospital encounter of 01/18/20 (from the past 48 hour(s))  SARS CORONAVIRUS 2 (TAT 6-24 HRS) Nasopharyngeal Nasopharyngeal Swab     Status: None   Collection Time: 01/18/20  2:35 PM   Specimen: Nasopharyngeal Swab  Result Value Ref Range   SARS Coronavirus 2 NEGATIVE NEGATIVE    Comment: (NOTE) SARS-CoV-2 target nucleic acids are NOT DETECTED.  The SARS-CoV-2 RNA is generally detectable in upper and lower respiratory specimens during the acute phase of infection. Negative results do not preclude SARS-CoV-2 infection, do not rule out co-infections with other pathogens, and should not be used as the sole basis for  treatment or other patient management decisions. Negative results must be combined with clinical observations, patient history, and epidemiological information. The expected result is  Negative.  Fact Sheet for Patients: SugarRoll.be  Fact Sheet for Healthcare Providers: https://www.woods-mathews.com/  This test is not yet approved or cleared by the [place] FDA and  has been authorized for detection and/or diagnosis of SARS-CoV-2 by FDA under an Emergency Use Authorization (EUA). This EUA will remain  in effect (meaning this test can be used) for the duration of the COVID-19 declaration under Se ction 564(b)(1) of the Act, 21 U.S.C. section 360bbb-3(b)(1), unless the authorization is terminated or revoked sooner.  Performed at [facility], [place] [address] [place], [place] [address]     No results found.  IMAGING:   MRI of the lumbar spine dated 12/07/2019 was reviewed and compared to prior.  There has been removal of the relatively large herniated fragment.  There is perhaps slight worsening of grade 1 retrolisthesis of L5 on S1.  There is continued significant disc desiccation and loss of height.  There is also significant reactive endplate changes at L5 and S1.  Impression/Plan    39 y.o. female  with worsening low back pain with intermittent severe exacerbations now 1 year after L5-S1 laminotomy and microdiskectomy likely secondary to spondylolisthesis with significant disc desiccation and reactive endplate changes at J5-K0.  She has failed reasonable conservative treatment. We will proceed with L5-S1 decompression and fusion which includes placement of intervertebral cages, interbody arthrodesis, posterior nonsegmental instrumentation and likely use of BMP.  We have reviewed the indications for surgery, the associated risks, benefits and alternatives at length in the office.  All questions today were answered and consent was obtained.  Consuella Lose, MD [facility]

## 2020-01-20 NOTE — Evaluation (Signed)
Physical Therapy Evaluation Patient Details Name: [name] [name] MRN: [id number] DOB: 10-10-1980 Today's Date: 01/20/2020   History of Present Illness  Pt is a 39yo female presenting s/p L5-S1 decompression/fusion 7/16 due to ongoing LBP following microdiscectomy on 12/31/2018. PMH includes R TKA.  Clinical Impression  Pt in bed upon arrival of PT, agreeable to evaluation at this time. 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 The pt lives in a 3rd floor apt with ~3 sets of 13 steps to enter, but lives with multiple family members who could assist as needed following d/c. The pt now presents with limitations in functional mobility, power, strength, stability, and endurance due to above dx and resulting pain, and will continue to benefit from skilled PT to address these deficits. The pt was able to demo good hallway ambulation with HHA of 2, but may benefit from progression to use of RW for improved independence with stability in the short term. She will need to participate in stair training prior to d/c due to significant number of stairs to enter her home, and will continue to benefit from skilled PT to further progress functional strength and reduce risk of falls upon return home.      Follow Up Recommendations Follow surgeon's recommendation for DC plan and follow-up therapies;Supervision for mobility/OOB;No PT follow up    Equipment Recommendations   (pt has RW and BSC at home)    Recommendations for Other Services       Precautions / Restrictions Precautions Precautions: Fall;Back Precaution Booklet Issued: Yes (comment) Precaution Comments: verbally reviewed Required Braces or Orthoses: Spinal Brace Spinal Brace: Lumbar corset Restrictions Weight Bearing Restrictions: No      Mobility  Bed Mobility Overal bed  mobility: Needs Assistance Bed Mobility: Sit to Sidelying;Rolling Rolling: Min guard       Sit to sidelying: Min guard General bed mobility comments: no assist needed, verbally cued for log roll and technique  Transfers Overall transfer level: Needs assistance Equipment used: 1 person hand held assist Transfers: Sit to/from Stand Sit to Stand: Min assist         General transfer comment: mina from HHA, pt with good use of hands on surface to lower with good control  Ambulation/Gait Ambulation/Gait assistance: Min assist Gait Distance (Feet): 250 Feet Assistive device: 2 person hand held assist Gait Pattern/deviations: Step-through pattern;Decreased stride length;Shuffle   Gait velocity interpretation: <1.31 ft/sec, indicative of household ambulator General Gait Details: pt with very short, shuffling steps with minimal clearance but no LOB. Slight increase in lateral mobility when trialing HHA of 1, may benefit from short term use of RW to increase stability  Stairs Stairs:  (pt has 3x13 steps to enter)                Balance Overall balance assessment: Mild deficits observed, not formally tested                                           Pertinent Vitals/Pain Pain Assessment: Faces Faces Pain Scale: Hurts even more Pain Location: low back, increased with continued activity Pain Descriptors / Indicators: Grimacing;Sore;Throbbing Pain Intervention(s): Limited activity within patient's tolerance;Monitored during session;Repositioned;Patient requesting pain meds-RN notified  Home Living Family/patient expects to be discharged to:: Private residence Living Arrangements: Spouse/significant other Available Help at Discharge: Family;Available 24 hours/day Type of Home: Apartment Home Access: Stairs to enter Entrance Stairs-Rails: Can reach both Entrance Stairs-Number of Steps: 3 flights of 13 steps Home Layout: One level Home Equipment: Walker -  2 wheels;Bedside commode;Cane - quad;Cane - single point Additional Comments: pt family unsure about [facility] but think they have one    Prior Function Level of Independence: Independent         Comments: pt reports still working, driving. limited in activity by pain     Hand Dominance        Extremity/Trunk Assessment   Upper Extremity Assessment Upper Extremity Assessment: Overall WFL for tasks assessed    Lower Extremity Assessment Lower Extremity Assessment: Generalized weakness;RLE deficits/detail RLE Deficits / Details: 3-/5 for knee ext and flex, hip flex. 3+/5 for ankle DF RLE Sensation: decreased light touch (in L5 dermatome)    Cervical / Trunk Assessment Cervical / Trunk Assessment:  (s/p L5-S1 decomp-fusion)  Communication   Communication: No difficulties  Cognition Arousal/Alertness: Awake/alert Behavior During Therapy: WFL for tasks assessed/performed Overall Cognitive Status: Within Functional Limits for tasks assessed                                 General Comments: pt cooperative and agreeable to all education      General Comments General comments (skin integrity, edema, etc.): pt limited by pain today, but will need stair training prior to d/c        Assessment/Plan    PT Assessment Patient needs continued PT services  PT Problem List Decreased strength;Decreased mobility;Decreased range of motion;Decreased balance;Pain       PT Treatment Interventions DME instruction;Therapeutic exercise;Gait training;Balance training;Stair training;Functional mobility training;Therapeutic activities;Patient/family education    PT Goals (Current goals can be found in the Care Plan section)  Acute Rehab PT Goals Patient Stated Goal: return home PT Goal Formulation: With patient Time For Goal Achievement: 02/03/20 Potential to Achieve Goals: Good    Frequency Min 5X/week   Barriers to discharge Inaccessible home environment pt with 3 sets  of 13 steps to enter       AM-PAC PT "6 Clicks" Mobility  Outcome Measure Help needed turning from your back to your side while in a flat bed without using bedrails?: A Little Help needed moving from lying on your back to sitting on the side of a flat bed without using bedrails?: A Little Help needed moving to and from a bed to a chair (including a wheelchair)?: A Little Help needed standing up from a chair using your arms (e.g., wheelchair or bedside chair)?: A Little Help needed to walk in hospital room?: A Little Help needed climbing 3-5 steps with a railing? : A Lot 6 Click Score: 17    End of Session Equipment Utilized During Treatment: Back brace Activity Tolerance: Patient tolerated treatment well;Patient limited by pain Patient left: in bed;with family/visitor present;with nursing/sitter in room;with call bell/phone within reach Nurse Communication: Mobility status (d/c needs) PT Visit Diagnosis: Muscle weakness (generalized) (M62.81);Difficulty in walking, not elsewhere classified (R26.2);Pain Pain - Right/Left:  (bilateral) Pain - part of body:  (low back)    Time: 2025-4270 PT Time Calculation (min) (ACUTE ONLY): 20 min   Charges:   PT Evaluation $PT Eval Low Complexity: 1 Low          Nakoa Ganus  Hillery Jacks, DPT   Acute Rehabilitation Department Pager #: [phone]  Otho Bellows 01/20/2020, 5:26 PM

## 2020-01-20 NOTE — Op Note (Signed)
NEUROSURGERY OPERATIVE NOTE   PREOP DIAGNOSIS:  1. Lumbago with radiculopathy, L5-S1 2. Lumbar spondylosis, L5-S1  POSTOP DIAGNOSIS: Same  PROCEDURE: 1. L5 laminectomy with facetectomy for decompression of exiting nerve roots, more than would be required for placement of interbody graft 2. Placement of anterior interbody device - [employer]tronic expandable cage 7x22.83mm x2 3. Posterior non-segmental instrumentation using cortical pedicle screws at L5 - S1 4. Interbody arthrodesis, L5-S1 5. Use of locally harvested bone autograft 6. Use of non-structural bone allograft - BMP  SURGEON: Dr. Consuella Lose, MD  ASSISTANT: Ferne Reus, PA-C  ANESTHESIA: General Endotracheal  EBL: 100cc  SPECIMENS: None  DRAINS: None  COMPLICATIONS: None immediate  CONDITION: Hemodynamically stable to PACU  HISTORY: [name] [name] is a 39 y.o. female who has been followed in the outpatient clinic with back and leg pain related to spondylosis at L5-S1. She had previously undergone right-sided L5 laminotomy for decompression about a year ago with transient improvement in her symptoms. Unfortunately she presented to the outpatient clinic with recurrent back and right-sided leg pain. Multiple conservative treatments were attempted without significant improvement and we ultimately elected to proceed with surgical decompression and fusion. Risks, benefits, and alternative treatments were reviewed in detail in the office. After all questions were answered, informed consent was obtained and witnessed.  PROCEDURE IN DETAIL: The patient was brought to the operating room via stretcher. After induction of general anesthesia, the patient was positioned on the operative table in the prone position. All pressure points were meticulously padded. Incision was then marked out and prepped and draped in the usual sterile fashion.  After timeout was conducted, previous skin was infiltrated with local anesthetic.  Skin incision was then made sharply and Bovie electrocautery was used to dissect the subcutaneous tissue until the lumbodorsal fascia was identified and incised. The muscle was then elevated in the subperiosteal plane and the L5 lamina and L5-S1 facet complexes were identified. Care was taken to identify the defect from the previous right-sided decompression. Self-retaining retractors were then placed. Lateral fluoroscopy was taken with a dissector in the L5-S1 interspace to confirm our location.  At this point attention was turned to decompression. Complete  L5 laminectomy was completed with a high-speed drill and Kerrison punches.  Normal dura was identified.   Interestingly, there was minimal epidural fibrosis from the prior surgery. A ball-tipped dissector was then used to identify the foramina bilaterally.  High-speed drill was used to cut across the pars interarticularis and the inferior articulating process of L5 was removed bilaterally.  The exiting nerve roots and the traversing nerve roots were then identified.  I was then able to easily pass a ball dissector in the ventral epidural space and underneath the bilateral L5 and S1 nerves indicating good decompression.   Disc space was then identified and noted to be significantly collapsed. The posterior annulus and PLL were incised with 11 blade scalpel. Entry into the disc space required use of an osteotome. Sequential shavers were then used to slowly expand the disc space. Discectomy was then completed using a combination of curettes and rongeurs. Endplates were prepared using curettes and rasps, and bone harvested during decompression was mixed with BMP and packed into the interspace. A 7 mm expandable cage was tapped into place and expanded to achieve good endplate apposition. Position was checked with lateral fluoroscopy. Cages were then backfilled with a mixture of allograft and autograft. This procedure was repeated bilaterally.  At this point,  the entry points for bilateral L5  and S1 cortical pedicle screws were identified using standard anatomic landmarks and lateral fluoro. Pilot holes were then drilled and tapped to 5.5 x 35 mm. Screws were then placed in L5 and S1 bilaterally. Prebent lordotic rod was then sized and placed into the pedicle screws. Set screws were placed and final tightened. Final AP and lateral fluoroscopic images confirmed good position.  Hemostasis was secured and confirmed with bipolar cautery and morcellized gelfoam with thrombin. The wound was then irrigated with copious amounts of antibiotic saline, then closed in standard fashion using a combination of interrupted 0 and 3-0 Vicryl stitches in the muscular, fascial, and subcutaneous layers. Skin was then closed using standard Dermabond. Sterile dressing was then applied. The patient was then transferred to the stretcher, extubated, and taken to the postanesthesia care unit in stable hemodynamic condition.  At the end of the case all sponge, needle, cottonoid, and instrument counts were correct.

## 2020-01-20 NOTE — Transfer of Care (Signed)
Immediate Anesthesia Transfer of Care Note  Patient: [name] [name]  Procedure(s) Performed: POSTERIOR LUMBAR INTERBODY FUSION LUMBAR FIVE- SACCRAL ONE, POSTERIOR NON-SEGMENTAL INSTRUMENTATION (N/A Spine Lumbar)  Patient Location: PACU  Anesthesia Type:General  Level of Consciousness: awake, alert  and oriented  Airway & Oxygen Therapy: Patient Spontanous Breathing and Patient connected to nasal cannula oxygen  Post-op Assessment: Report given to RN and Post -op Vital signs reviewed and stable  Post vital signs: Reviewed and stable  Last Vitals:  Vitals Value Taken Time  BP 121/84 01/20/20 1128  Temp 36.4 C 01/20/20 1128  Pulse 95 01/20/20 1133  Resp 21 01/20/20 1133  SpO2 99 % 01/20/20 1133  Vitals shown include unvalidated device data.  Last Pain:  Vitals:   01/20/20 0610  TempSrc:   PainSc: 3       Patients Stated Pain Goal: 2 (94/80/16 5537)  Complications: No complications documented.

## 2020-01-20 NOTE — Anesthesia Procedure Notes (Signed)
Procedure Name: Intubation Date/Time: 01/20/2020 8:03 AM Performed by: Valda Favia, CRNA Pre-anesthesia Checklist: Patient identified, Emergency Drugs available, Suction available and Patient being monitored Patient Re-evaluated:Patient Re-evaluated prior to induction Oxygen Delivery Method: Circle System Utilized Preoxygenation: Pre-oxygenation with 100% oxygen Induction Type: IV induction Ventilation: Mask ventilation without difficulty Laryngoscope Size: Mac and 4 Grade View: Grade I Tube type: Oral Tube size: 7.0 mm Number of attempts: 1 Airway Equipment and Method: Stylet and Oral airway Placement Confirmation: ETT inserted through vocal cords under direct vision,  positive ETCO2 and breath sounds checked- equal and bilateral Secured at: 21 cm Tube secured with: Tape Dental Injury: Teeth and Oropharynx as per pre-operative assessment

## 2020-01-20 NOTE — Progress Notes (Signed)
Orthopedic Tech Progress Note Patient Details:  [name] [name] April 23, 1981 [id number] Called in order to [facility] for an [place]  Patient ID: [name] [name], female   DOB: [birth year], 39 y.o.   MRN: [id number]   [name] 01/20/2020, 12:02 PM

## 2020-01-21 LAB — BASIC METABOLIC PANEL
Anion gap: 11 (ref 5–15)
BUN: 10 mg/dL (ref 6–20)
CO2: 22 mmol/L (ref 22–32)
Calcium: 8.8 mg/dL — ABNORMAL LOW (ref 8.9–10.3)
Chloride: 106 mmol/L (ref 98–111)
Creatinine, Ser: 0.88 mg/dL (ref 0.44–1.00)
GFR calc Af Amer: >60 mL/min (ref >60)
GFR calc non Af Amer: >60 mL/min (ref >60)
Glucose, Bld: 84 mg/dL (ref 70–99)
Potassium: 3.8 mmol/L (ref 3.5–5.1)
Sodium: 139 mmol/L (ref 135–145)

## 2020-01-21 LAB — CBC
HCT: 37.1 % (ref 36.0–46.0)
Hemoglobin: 12.1 g/dL (ref 12.0–15.0)
MCH: 31.8 pg (ref 26.0–34.0)
MCHC: 32.6 g/dL (ref 30.0–36.0)
MCV: 97.4 fL (ref 80.0–100.0)
Platelets: 249 10*3/uL (ref 150–400)
RBC: 3.81 MIL/uL — ABNORMAL LOW (ref 3.87–5.11)
RDW: 12.2 % (ref 11.5–15.5)
WBC: 11.8 10*3/uL — ABNORMAL HIGH (ref 4.0–10.5)
nRBC: 0 % (ref 0.0–0.2)

## 2020-01-21 LAB — APTT: aPTT: 27 seconds (ref 24–36)

## 2020-01-21 LAB — PROTIME-INR
INR: 1 (ref 0.8–1.2)
Prothrombin Time: 12.7 seconds (ref 11.4–15.2)

## 2020-01-21 MED ORDER — OXYCODONE HCL 5 MG PO TABS: 5.0000 mg | ORAL_TABLET | ORAL | 0 refills | Status: DC | PRN

## 2020-01-21 NOTE — Discharge Summary (Signed)
°  Physician Discharge Summary  Patient ID: [name] [name] MRN: [id number] DOB/AGE: 41/02/1981 41 y.o. Estimated body mass index is 27.07 kg/m as calculated from the following:   Height as of this encounter: 5\' 2"  (1.575 m).   Weight as of this encounter: 67.1 kg.   Admit date: 01/20/2020 Discharge date: 01/21/2020  Admission Diagnoses: Lumbar spondylosis with radiculopathy L5-S1  Discharge Diagnoses: Same Active Problems:   Lumbar radiculopathy   Discharged Condition: good  Hospital Course: Patient is admitted to hospital underwent decompression stabilization procedure L5-S1.  Postoperatively patient did very well recovering the floor was able ending voiding spontaneously tolerating regular diet stable for discharge home.  Consults: Significant Diagnostic Studies: Treatments: L5-S1 posterior lumbar interbody fusion Discharge Exam: Blood pressure 103/65, pulse 100, temperature 98.5 F (36.9 C), temperature source Oral, resp. rate 16, height 5\' 2"  (1.575 m), weight 67.1 kg, last menstrual period 05/09/2013, SpO2 100 %. Strength 5-5 wound clean dry and intact  Disposition: Home   Allergies as of 01/21/2020      Reactions   Duloxetine Hcl Nausea And Vomiting   Nitrofurantoin Monohyd Macro Hives, Shortness Of Breath   Aspirin    Bleeding ulcer   Morphine And Related Nausea And Vomiting      Medication List    TAKE these medications   albuterol 108 (90 Base) MCG/ACT inhaler Commonly known as: VENTOLIN HFA Inhale 2 puffs into the lungs every 6 (six) hours as needed for wheezing or shortness of breath. What changed:   when to take this  additional instructions   buPROPion 300 MG 24 hr tablet Commonly known as: WELLBUTRIN XL Take 300 mg by mouth at bedtime.   busPIRone 10 MG tablet Commonly known as: BUSPAR Take 10 mg by mouth 3 (three) times daily.   famotidine 40 MG tablet Commonly known as: PEPCID Take 40 mg by mouth every evening.    Fluticasone-Salmeterol 500-50 MCG/DOSE Aepb Commonly known as: ADVAIR Inhale 1 puff into the lungs daily.   gabapentin 100 MG capsule Commonly known as: NEURONTIN Take 1-3 capsules (100-300 mg total) by mouth 3 (three) times daily.   methocarbamol 750 MG tablet Commonly known as: ROBAXIN Take 1 tablet (750 mg total) by mouth 2 (two) times daily as needed for muscle spasms.   oxyCODONE 5 MG immediate release tablet Commonly known as: Oxy IR/ROXICODONE Take 1-2 tablets (5-10 mg total) by mouth every 4 (four) hours as needed for severe pain ((score 7 to 10)).   pantoprazole 40 MG tablet Commonly known as: PROTONIX Take 40 mg by mouth daily.        Signed: Elaina Hoops 01/21/2020, 7:50 AM

## 2020-01-21 NOTE — Progress Notes (Signed)
Physical Therapy Treatment Patient Details Name: [name] [name] MRN: [id number] DOB: December 27, 1980 Today's Date: 01/21/2020    History of Present Illness Pt is a 39yo female presenting s/p L5-S1 decompression/fusion 7/16 due to ongoing LBP following microdiscectomy on 12/31/2018. PMH includes R TKA.    PT Comments    Pt making excellent progress with mobility. She tolerated stair training this session without difficulties. PT provided pt education re: back precautions, car transfers and a generalized walking program for pt to initiate upon d/c home. Plan is to d/c home today with family support.     Follow Up Recommendations  No PT follow up     Equipment Recommendations  None recommended by PT    Recommendations for Other Services       Precautions / Restrictions Precautions Precautions: Fall;Back Precaution Comments: verbally reviewed Required Braces or Orthoses: Spinal Brace Spinal Brace: Lumbar corset Restrictions Weight Bearing Restrictions: No    Mobility  Bed Mobility               General bed mobility comments: pt standing in room with OT present upon arrival  Transfers                 General transfer comment: pt standing in room with OT present upon arrival  Ambulation/Gait Ambulation/Gait assistance: Supervision Gait Distance (Feet): 500 Feet Assistive device: None Gait Pattern/deviations: Step-through pattern;Decreased stride length Gait velocity: slightly reduced   General Gait Details: pt overall with steady gait in hallway without the use of an AD or need for UE supports. No instability or LOB, supervision for safety   Stairs Stairs: Yes Stairs assistance: Supervision Stair Management: One rail Right;Step to pattern;Forwards Number of Stairs: 10 General stair comments: pt able to perform without difficulties, no LOB or need for physical assistance   Wheelchair Mobility    Modified Rankin (Stroke Patients Only)        Balance Overall balance assessment: Mild deficits observed, not formally tested                                          Cognition Arousal/Alertness: Awake/alert Behavior During Therapy: WFL for tasks assessed/performed Overall Cognitive Status: Within Functional Limits for tasks assessed                                        Exercises      General Comments        Pertinent Vitals/Pain Pain Assessment: Faces Faces Pain Scale: Hurts a little bit Pain Location: lower back Pain Descriptors / Indicators: Sore Pain Intervention(s): Monitored during session;Repositioned    Home Living                      Prior Function            PT Goals (current goals can now be found in the care plan section) Acute Rehab PT Goals PT Goal Formulation: With patient Time For Goal Achievement: 02/03/20 Potential to Achieve Goals: Good Progress towards PT goals: Progressing toward goals    Frequency    Min 5X/week      PT Plan Current plan remains appropriate    Co-evaluation              AM-PAC PT "6 Clicks" Mobility  Outcome Measure  Help needed turning from your back to your side while in a flat bed without using bedrails?: None Help needed moving from lying on your back to sitting on the side of a flat bed without using bedrails?: None Help needed moving to and from a bed to a chair (including a wheelchair)?: None Help needed standing up from a chair using your arms (e.g., wheelchair or bedside chair)?: None Help needed to walk in hospital room?: None Help needed climbing 3-5 steps with a railing? : None 6 Click Score: 24    End of Session Equipment Utilized During Treatment: Back brace Activity Tolerance: Patient tolerated treatment well Patient left: with call bell/phone within reach;Other (comment) (seated EOB) Nurse Communication: Mobility status PT Visit Diagnosis: Muscle weakness (generalized) (M62.81);Difficulty  in walking, not elsewhere classified (R26.2);Pain Pain - part of body:  (back)     Time: 7357-8978 PT Time Calculation (min) (ACUTE ONLY): 10 min  Charges:  $Gait Training: 8-22 mins                     Anastasio Champion, DPT  Acute Rehabilitation Services Pager [phone] Office [place] 01/21/2020, 9:52 AM

## 2020-01-21 NOTE — Progress Notes (Signed)
Discharged instructions/education/AVS/Rx given to patient and verbalized understanding. All questions have been answered appropriately and patient is satisfied. Pain is mild to moderate per patient. No swelling, no drainage, no redness noted on incision site. Ambulating independently. Voiding and emptying bladder well. Awaiting for transport.

## 2020-01-21 NOTE — Discharge Instructions (Signed)
Wound Care °Leave incision open to air. °You may shower. °Do not scrub directly on incision.  °Do not put any creams, lotions, or ointments on incision. °Activity °Walk each and every day, increasing distance each day. °No lifting greater than 5 lbs.  Avoid bending, arching, and twisting. °No driving for 2 weeks; may ride as a passenger locally. °If provided with back brace, wear when out of bed.  It is not necessary to wear in bed. °Diet °Resume your normal diet.  °Return to Work °Will be discussed at you follow up appointment. °Call Your Doctor If Any of These Occur °Redness, drainage, or swelling at the wound.  °Temperature greater than 101 degrees. °Severe pain not relieved by pain medication. °Incision starts to come apart. °Follow Up Appt °Call today for appointment in 1-2 weeks ([phone]) or for problems.  If you have any hardware placed in your spine, you will need an x-ray before your appointment. °

## 2020-01-21 NOTE — Anesthesia Postprocedure Evaluation (Signed)
Anesthesia Post Note  Patient: [name] [name]  Procedure(s) Performed: POSTERIOR LUMBAR INTERBODY FUSION LUMBAR FIVE- SACCRAL ONE, POSTERIOR NON-SEGMENTAL INSTRUMENTATION (N/A Spine Lumbar)     Patient location during evaluation: PACU Anesthesia Type: General Level of consciousness: sedated and patient cooperative Pain management: pain level controlled Vital Signs Assessment: post-procedure vital signs reviewed and stable Respiratory status: spontaneous breathing Cardiovascular status: stable Anesthetic complications: no   No complications documented.  Last Vitals:  Vitals:   01/21/20 0329 01/21/20 0723  BP: 121/69 103/65  Pulse: 66 100  Resp: 16 16  Temp: 37.1 C 36.9 C  SpO2: 100% 100%    Last Pain:  Vitals:   01/21/20 0723  TempSrc: Oral  PainSc:                  Nolon Nations

## 2020-01-21 NOTE — Evaluation (Signed)
Occupational Therapy Evaluation Patient Details Name: [name] [name] MRN: [id number] DOB: Mar 24, 1981 Today's Date: 01/21/2020    History of Present Illness Pt is a 39yo female presenting s/p L5-S1 decompression/fusion 7/16 due to ongoing LBP following microdiscectomy on 12/31/2018. PMH includes R TKA.   Clinical Impression   Pt PTA: Pt living with s/o and reports independence prior with ADL and mobility. Pt currently already dressed so pt simulating LB dressing by donning/doffing socks with a combination of hip hike and figure 4 techniques. Pt donning.doffing brace without difficulty. Pt simulating tub transfer with no difficulty and ability to maintain back precautions. Pt ambulating with no AD and modified independence in room.  Back handout provided and reviewed ADL in detail. Pt educated on: clothing between brace, never sleep in brace, set an alarm at night for medication, avoid sitting for long periods of time, correct bed positioning for sleeping, correct sequence for bed mobility, avoiding lifting more than 5 pounds and never wash directly over incision. All education is complete and patient indicates understanding. No further OT skilled services required. OT signing off. Thank you.      Follow Up Recommendations  No OT follow up    Equipment Recommendations  None recommended by OT    Recommendations for Other Services       Precautions / Restrictions Precautions Precautions: Fall;Back Precaution Comments: verbally reviewed Required Braces or Orthoses: Spinal Brace Spinal Brace: Lumbar corset Restrictions Weight Bearing Restrictions: No      Mobility Bed Mobility Overal bed mobility: Needs Assistance Bed Mobility: Supine to Sit     Supine to sit: Supervision     General bed mobility comments: Pt verbalizing log roll as OT entering room  Transfers Overall transfer level: Needs assistance Equipment used: None Transfers: Sit to/from Stand Sit to Stand:  Modified independent (Device/Increase time)         General transfer comment: no physical assist required    Balance Overall balance assessment: Mild deficits observed, not formally tested                                         ADL either performed or assessed with clinical judgement   ADL Overall ADL's : At baseline;Modified independent                                       General ADL Comments: Pt already dressed so pt simulating LB dressing by donning/doffing socks with a combination of hip hike and figure 4 techniques. Pt donning.doffing brace without difficulty. Pt simulating tub transfer with no difficulty and ability to maintain back precautions.     Vision Baseline Vision/History: No visual deficits Patient Visual Report: No change from baseline Vision Assessment?: No apparent visual deficits     Perception     Praxis      Pertinent Vitals/Pain Pain Assessment: Faces Faces Pain Scale: Hurts a little bit Pain Location: lower back Pain Descriptors / Indicators: Sore Pain Intervention(s): Monitored during session     Hand Dominance Right   Extremity/Trunk Assessment Upper Extremity Assessment Upper Extremity Assessment: Overall WFL for tasks assessed   Lower Extremity Assessment Lower Extremity Assessment: Overall WFL for tasks assessed   Cervical / Trunk Assessment Cervical / Trunk Assessment: Other exceptions Cervical / Trunk Exceptions: s/p lumbar/sacral sx   Communication  Communication Communication: No difficulties   Cognition Arousal/Alertness: Awake/alert Behavior During Therapy: WFL for tasks assessed/performed Overall Cognitive Status: Within Functional Limits for tasks assessed                                     General Comments  Pt appears ready for d/c    Exercises     Shoulder Instructions      Home Living Family/patient expects to be discharged to:: Private residence Living  Arrangements: Spouse/significant other Available Help at Discharge: Family;Available 24 hours/day Type of Home: Apartment Home Access: Stairs to enter Entrance Stairs-Number of Steps: 2 flights of 13 steps Entrance Stairs-Rails: Can reach both Home Layout: One level     Bathroom Shower/Tub: Teacher, early years/pre: Standard     Home Equipment: Environmental consultant - 2 wheels;Bedside commode;Cane - quad;Cane - single point   Additional Comments: Pt reports that family has a BSC      Prior Functioning/Environment Level of Independence: Independent                 OT Problem List: Pain      OT Treatment/Interventions:      OT Goals(Current goals can be found in the care plan section) Acute Rehab OT Goals Patient Stated Goal: return home  OT Frequency:     Barriers to D/C:            Co-evaluation              AM-PAC OT "6 Clicks" Daily Activity     Outcome Measure Help from another person eating meals?: None Help from another person taking care of personal grooming?: None Help from another person toileting, which includes using toliet, bedpan, or urinal?: None Help from another person bathing (including washing, rinsing, drying)?: A Little Help from another person to put on and taking off regular upper body clothing?: None Help from another person to put on and taking off regular lower body clothing?: None 6 Click Score: 23   End of Session Equipment Utilized During Treatment: Rolling walker;Back brace Nurse Communication: Mobility status;Precautions  Activity Tolerance: Patient tolerated treatment well Patient left: in bed;with call bell/phone within reach  OT Visit Diagnosis: Unsteadiness on feet (R26.81);Pain Pain - part of body:  (low back)                Time: 8250-5397 OT Time Calculation (min): 10 min Charges:  OT General Charges $OT Visit: 1 Visit OT Evaluation $OT Eval Low Complexity: 1 Low  Jefferey Pica, OTR/L Acute Rehabilitation  Services Pager: [phone] Office: [phone]   Jefferey Pica 01/21/2020, 11:46 AM

## 2020-01-31 ENCOUNTER — Ambulatory Visit: Payer: Medicaid Other | Admitting: Orthopaedic Surgery

## 2020-02-14 ENCOUNTER — Ambulatory Visit (INDEPENDENT_AMBULATORY_CARE_PROVIDER_SITE_OTHER): Payer: Medicaid Other | Admitting: Orthopaedic Surgery

## 2020-02-14 ENCOUNTER — Ambulatory Visit (INDEPENDENT_AMBULATORY_CARE_PROVIDER_SITE_OTHER): Payer: Medicaid Other

## 2020-02-14 ENCOUNTER — Encounter: Payer: Self-pay | Admitting: Orthopaedic Surgery

## 2020-02-14 DIAGNOSIS — Z96651 Presence of right artificial knee joint: Secondary | ICD-10-CM

## 2020-02-14 NOTE — Progress Notes (Signed)
Patient: [name] [name]           Date of Birth: 24-Nov-1980           MRN: [id number] Visit Date: 02/14/2020 [facility]: [facility]   Assessment & Plan:  Chief Complaint:  Chief Complaint  Patient presents with  . Right Knee - Pain   Visit Diagnoses:  1. Status post total right knee replacement     Plan: [name] is 73-month status post right total knee replacement.  She is doing very well and very happy overall.  She recently had back surgery which went well.  She has no complaints regarding the right knee.  Surgical scar is fully healed.  Excellent range of motion.  Collaterals are stable.  X-rays are unremarkable.  Dental prophylaxis reinforced today.  Activity as tolerated.  Follow-up in 6 months with two-view x-rays of the right knee.  Follow-Up Instructions: Return in about 6 months (around 08/16/2020).   Orders:  Orders Placed This Encounter  Procedures  . XR KNEE 3 VIEW RIGHT   No orders of the defined types were placed in this encounter.   Imaging: XR KNEE 3 VIEW RIGHT  Result Date: 02/14/2020 Stable total knee replacement without complication.   PMFS History: Patient Active Problem List   Diagnosis Date Noted  . Lumbar radiculopathy 01/20/2020  . Status post total right knee replacement 08/08/2019  . Pelvic pain in female 08/26/2017  . Pelvic peritoneal adhesions, female 08/26/2017  . Endometriosis of pelvis 08/26/2017  . Unilateral primary osteoarthritis, right knee 06/09/2017  . Unilateral primary osteoarthritis, left knee 06/09/2017  . Family history of breast cancer in mother 12/06/2015  . Menorrhagia 05/31/2013  . Dysmenorrhea 05/31/2013  . Dyspareunia 05/31/2013  . Asthma 08/05/2011   Past Medical History:  Diagnosis Date  . Allergy   . Arthritis    right knee  . Asthma   . Constipation   . Depression   . GERD (gastroesophageal reflux disease)   . History of kidney stones   . Panic disorder   . Pneumonia   . Renal disorder     . Seasonal allergies   . SVD (spontaneous vaginal delivery)    x 3    Family History  Problem Relation Age of Onset  . Hypertension Mother   . Diabetes Mother   . Breast cancer Mother 52       IDC; s/p lumpectomy, radiation, and arimidex  . Prostate cancer Paternal Grandfather        dx. 50s-60s  . Other Sister        both full sisters also have dense breast tissue  . Breast cancer Maternal Grandmother 83       IDC  . Colon polyps Maternal Grandmother        approx 4-5 colon polyps  . Pancreatic cancer Other        maternal great aunt (MGF's sister) d. pancreatic cancer  . Cancer Other        maternal great aunt (MGF's sister) dx NOS cancer  . Cancer Other        maternal great grandmother (MGF's mother) d. NOS cancer in her 57s  . Cancer Cousin 25       paternal 1st cousin dx. w/ either throat or thyroid cancer; not a smoker  . Breast cancer Other        paternal great aunt (PGF's sister) dx. breast cancer, 70s  . Colon cancer Neg Hx   . Rectal  cancer Neg Hx   . Stomach cancer Neg Hx   . Esophageal cancer Neg Hx     Past Surgical History:  Procedure Laterality Date  . ABDOMINAL HYSTERECTOMY    . adiana    . BILATERAL SALPINGECTOMY Bilateral 05/31/2013   Procedure: BILATERAL SALPINGECTOMY;  Surgeon: Cheri Fowler, MD;  Location: [facility];  Service: Gynecology;  Laterality: Bilateral;  . BREAST CYST ASPIRATION Right 2015  . COLONOSCOPY    . DILATION AND CURETTAGE OF UTERUS    . DNC    . KIDNEY STONE SURGERY    . KNEE ARTHROSCOPY Right   . KNEE ARTHROSCOPY WITH DRILLING/MICROFRACTURE Right 07/26/2014   Procedure: RIGHT KNEE ARTHROSCOPY WITH CHONDROPLASTY, MICROFRACTURE;  Surgeon: Marianna Payment, MD;  Location: [place];  Service: Orthopedics;  Laterality: Right;  . KNEE ARTHROSCOPY WITH MEDIAL MENISECTOMY Right 07/26/2014   Procedure: KNEE ARTHROSCOPY WITH MEDIAL MENISECTOMY;  Surgeon: Marianna Payment, MD;  Location: [place];   Service: Orthopedics;  Laterality: Right;  . LAPAROSCOPIC ASSISTED VAGINAL HYSTERECTOMY N/A 05/31/2013   Procedure: LAPAROSCOPIC ASSISTED VAGINAL HYSTERECTOMY;  Surgeon: Cheri Fowler, MD;  Location: [facility];  Service: Gynecology;  Laterality: N/A;  . LAPAROSCOPY N/A 08/26/2017   Procedure: LAPAROSCOPY OPERATIVE WITH PERIOTONEAL BIOPSY AND FULGERATION OF ENDOMETRIOSIS;  Surgeon: Cheri Fowler, MD;  Location: [facility];  Service: Gynecology;  Laterality: N/A;  . LUMBAR LAMINECTOMY/DECOMPRESSION MICRODISCECTOMY Right 12/31/2018   Procedure: MICRODISCECTOMY LUMBAR 5- SACRAL1;  Surgeon: Consuella Lose, MD;  Location: [place];  Service: Neurosurgery;  Laterality: Right;  MICRODISCECTOMY LUMBAR 5- SACRAL1  . PARTIAL HYSTERECTOMY    . TONSILLECTOMY    . TONSILLECTOMY AND ADENOIDECTOMY    . TOTAL KNEE ARTHROPLASTY Right 08/08/2019  . TOTAL KNEE ARTHROPLASTY Right 08/08/2019   Procedure: RIGHT TOTAL KNEE ARTHROPLASTY;  Surgeon: Leandrew Koyanagi, MD;  Location: [place];  Service: Orthopedics;  Laterality: Right;  . TUBAL LIGATION    . UPPER GI ENDOSCOPY     ulcer   Social History   Occupational History  . Not on file  Tobacco Use  . Smoking status: Never Smoker  . Smokeless tobacco: Never Used  Vaping Use  . Vaping Use: Never used  Substance and Sexual Activity  . Alcohol use: No    Alcohol/week: 0.0 standard drinks  . Drug use: No  . Sexual activity: Not on file

## 2020-08-14 ENCOUNTER — Ambulatory Visit (INDEPENDENT_AMBULATORY_CARE_PROVIDER_SITE_OTHER): Payer: Medicaid Other

## 2020-08-14 ENCOUNTER — Other Ambulatory Visit: Payer: Self-pay

## 2020-08-14 ENCOUNTER — Ambulatory Visit (INDEPENDENT_AMBULATORY_CARE_PROVIDER_SITE_OTHER): Payer: Medicaid Other | Admitting: Orthopaedic Surgery

## 2020-08-14 ENCOUNTER — Encounter: Payer: Self-pay | Admitting: Orthopaedic Surgery

## 2020-08-14 DIAGNOSIS — Z96651 Presence of right artificial knee joint: Secondary | ICD-10-CM

## 2020-08-14 NOTE — Progress Notes (Signed)
Office Visit Note   Patient: [name] [name]           Date of Birth: 01/17/1981           MRN: [id number] Visit Date: 08/14/2020              Requested by: [facility] [address] [place],  [place] [address] PCP: [place]: Visit Diagnoses:  1. Status post total right knee replacement     Plan: Impression is 1 year status post uncemented right total knee replacement.  She is doing well and very happy.  Dental prophylaxis reinforced.  Recheck in another year with two-view x-rays of the right knee.  Follow-Up Instructions: Return in about 1 year (around 08/14/2021).   Orders:  Orders Placed This Encounter  Procedures  . XR Knee 1-2 Views Right   No orders of the defined types were placed in this encounter.     Procedures: No procedures performed   Clinical Data: No additional findings.   Subjective: Chief Complaint  Patient presents with  . Right Knee - Routine Post Op    [name] [name] is 1 year status post uncemented right total knee replacement.  She is doing great has no complaints.  She had revision back surgery which has greatly improved her pain as well.  Overall she is in great spirits and very happy that she is able to live her life without pain.   Review of Systems   Objective: Vital Signs: LMP 05/09/2013   Physical Exam  Ortho Exam Right knee reveals a fully healed surgical scar.  Excellent range of motion.  No swelling.  Collaterals are stable. Specialty Comments:  No specialty comments available.  Imaging: XR Knee 1-2 Views Right  Result Date: 08/14/2020 Stable total knee replacement in good alignment.  There is no evidence of loosening or hardware complications    PMFS History: Patient Active Problem List   Diagnosis Date Noted  . Lumbar radiculopathy 01/20/2020  . Status post total right knee replacement 08/08/2019  . Pelvic pain in female 08/26/2017  . Pelvic peritoneal adhesions,  female 08/26/2017  . Endometriosis of pelvis 08/26/2017  . Unilateral primary osteoarthritis, right knee 06/09/2017  . Unilateral primary osteoarthritis, left knee 06/09/2017  . Family history of breast cancer in mother 12/06/2015  . Menorrhagia 05/31/2013  . Dysmenorrhea 05/31/2013  . Dyspareunia 05/31/2013  . Asthma 08/05/2011   Past Medical History:  Diagnosis Date  . Allergy   . Arthritis    right knee  . Asthma   . Constipation   . Depression   . GERD (gastroesophageal reflux disease)   . History of kidney stones   . Panic disorder   . Pneumonia   . Renal disorder   . Seasonal allergies   . SVD (spontaneous vaginal delivery)    x 3    Family History  Problem Relation Age of Onset  . Hypertension Mother   . Diabetes Mother   . Breast cancer Mother 5       IDC; s/p lumpectomy, radiation, and arimidex  . Prostate cancer Paternal Grandfather        dx. 50s-60s  . Other Sister        both full sisters also have dense breast tissue  . Breast cancer Maternal Grandmother 83       IDC  . Colon polyps Maternal Grandmother        approx 4-5 colon polyps  . Pancreatic  cancer Other        maternal great aunt (MGF's sister) d. pancreatic cancer  . Cancer Other        maternal great aunt (MGF's sister) dx NOS cancer  . Cancer Other        maternal great grandmother (MGF's mother) d. NOS cancer in her 89s  . Cancer Cousin 25       paternal 1st cousin dx. w/ either throat or thyroid cancer; not a smoker  . Breast cancer Other        paternal great aunt (PGF's sister) dx. breast cancer, 62s  . Colon cancer Neg Hx   . Rectal cancer Neg Hx   . Stomach cancer Neg Hx   . Esophageal cancer Neg Hx     Past Surgical History:  Procedure Laterality Date  . ABDOMINAL HYSTERECTOMY    . [place]    . BILATERAL SALPINGECTOMY Bilateral 05/31/2013   Procedure: BILATERAL SALPINGECTOMY;  Surgeon: Cheri Fowler, MD;  Location: [facility];  Service: Gynecology;  Laterality: Bilateral;  .  BREAST CYST ASPIRATION Right 2015  . COLONOSCOPY    . DILATION AND CURETTAGE OF UTERUS    . [facility]    . KIDNEY STONE SURGERY    . KNEE ARTHROSCOPY Right   . KNEE ARTHROSCOPY WITH DRILLING/MICROFRACTURE Right 07/26/2014   Procedure: RIGHT KNEE ARTHROSCOPY WITH CHONDROPLASTY, MICROFRACTURE;  Surgeon: Marianna Payment, MD;  Location: [facility];  Service: Orthopedics;  Laterality: Right;  . KNEE ARTHROSCOPY WITH MEDIAL MENISECTOMY Right 07/26/2014   Procedure: KNEE ARTHROSCOPY WITH MEDIAL MENISECTOMY;  Surgeon: Marianna Payment, MD;  Location: [facility];  Service: Orthopedics;  Laterality: Right;  . LAPAROSCOPIC ASSISTED VAGINAL HYSTERECTOMY N/A 05/31/2013   Procedure: LAPAROSCOPIC ASSISTED VAGINAL HYSTERECTOMY;  Surgeon: Cheri Fowler, MD;  Location: [facility];  Service: Gynecology;  Laterality: N/A;  . LAPAROSCOPY N/A 08/26/2017   Procedure: LAPAROSCOPY OPERATIVE WITH PERIOTONEAL BIOPSY AND FULGERATION OF ENDOMETRIOSIS;  Surgeon: Cheri Fowler, MD;  Location: [facility];  Service: Gynecology;  Laterality: N/A;  . LUMBAR LAMINECTOMY/DECOMPRESSION MICRODISCECTOMY Right 12/31/2018   Procedure: MICRODISCECTOMY LUMBAR 5- SACRAL1;  Surgeon: Consuella Lose, MD;  Location: [facility];  Service: Neurosurgery;  Laterality: Right;  MICRODISCECTOMY LUMBAR 5- SACRAL1  . PARTIAL HYSTERECTOMY    . TONSILLECTOMY    . TONSILLECTOMY AND ADENOIDECTOMY    . TOTAL KNEE ARTHROPLASTY Right 08/08/2019  . TOTAL KNEE ARTHROPLASTY Right 08/08/2019   Procedure: RIGHT TOTAL KNEE ARTHROPLASTY;  Surgeon: Leandrew Koyanagi, MD;  Location: [facility];  Service: Orthopedics;  Laterality: Right;  . TUBAL LIGATION    . UPPER GI ENDOSCOPY     ulcer   Social History   Occupational History  . Not on file  Tobacco Use  . Smoking status: Never Smoker  . Smokeless tobacco: Never Used  Vaping Use  . Vaping Use: Never used  Substance and Sexual Activity  . Alcohol use: No    Alcohol/week: 0.0 standard drinks  .  Drug use: No  . Sexual activity: Not on file

## 2020-11-05 ENCOUNTER — Other Ambulatory Visit: Payer: Self-pay | Admitting: Obstetrics and Gynecology

## 2020-11-05 ENCOUNTER — Other Ambulatory Visit: Payer: Self-pay

## 2020-11-05 DIAGNOSIS — Z1231 Encounter for screening mammogram for malignant neoplasm of breast: Secondary | ICD-10-CM

## 2021-01-01 ENCOUNTER — Inpatient Hospital Stay: Admission: RE | Admit: 2021-01-01 | Payer: Medicaid Other | Source: Ambulatory Visit

## 2021-02-21 ENCOUNTER — Ambulatory Visit
Admission: RE | Admit: 2021-02-21 | Discharge: 2021-02-21 | Disposition: A | Discharge status: Home / Self Care | Payer: Medicaid Other | Source: Ambulatory Visit | Attending: Obstetrics and Gynecology | Admitting: Obstetrics and Gynecology

## 2021-02-21 ENCOUNTER — Other Ambulatory Visit: Payer: Self-pay

## 2021-02-21 DIAGNOSIS — Z1231 Encounter for screening mammogram for malignant neoplasm of breast: Secondary | ICD-10-CM

## 2021-02-25 ENCOUNTER — Other Ambulatory Visit: Payer: Self-pay | Admitting: Obstetrics and Gynecology

## 2021-02-25 DIAGNOSIS — R928 Other abnormal and inconclusive findings on diagnostic imaging of breast: Secondary | ICD-10-CM

## 2021-03-12 ENCOUNTER — Ambulatory Visit
Admission: RE | Admit: 2021-03-12 | Discharge: 2021-03-12 | Disposition: A | Discharge status: Home / Self Care | Payer: Medicaid Other | Source: Ambulatory Visit | Attending: Obstetrics and Gynecology | Admitting: Obstetrics and Gynecology

## 2021-03-12 ENCOUNTER — Other Ambulatory Visit: Payer: Self-pay

## 2021-03-12 ENCOUNTER — Other Ambulatory Visit: Payer: Self-pay | Admitting: Obstetrics and Gynecology

## 2021-03-12 DIAGNOSIS — R928 Other abnormal and inconclusive findings on diagnostic imaging of breast: Secondary | ICD-10-CM

## 2021-03-18 ENCOUNTER — Ambulatory Visit
Admission: RE | Admit: 2021-03-18 | Discharge: 2021-03-18 | Disposition: A | Discharge status: Home / Self Care | Payer: Medicaid Other | Source: Ambulatory Visit | Attending: Obstetrics and Gynecology | Admitting: Obstetrics and Gynecology

## 2021-03-18 ENCOUNTER — Other Ambulatory Visit: Payer: Self-pay

## 2021-03-18 DIAGNOSIS — R928 Other abnormal and inconclusive findings on diagnostic imaging of breast: Secondary | ICD-10-CM

## 2021-03-20 ENCOUNTER — Other Ambulatory Visit: Payer: Self-pay | Admitting: Family Medicine

## 2021-03-20 DIAGNOSIS — N644 Mastodynia: Secondary | ICD-10-CM

## 2021-04-03 ENCOUNTER — Other Ambulatory Visit: Payer: Self-pay

## 2021-04-03 ENCOUNTER — Ambulatory Visit
Admission: RE | Admit: 2021-04-03 | Discharge: 2021-04-03 | Disposition: A | Discharge status: Home / Self Care | Payer: Medicaid Other | Source: Ambulatory Visit | Attending: Family Medicine | Admitting: Family Medicine

## 2021-04-03 DIAGNOSIS — N644 Mastodynia: Secondary | ICD-10-CM

## 2021-04-03 MED ORDER — GADOBUTROL 1 MMOL/ML IV SOLN
7.0000 mL | Freq: Once | INTRAVENOUS | Status: AC | PRN
  Administered 2021-04-03: 7 mL via INTRAVENOUS

## 2021-06-25 ENCOUNTER — Telehealth: Payer: Self-pay | Admitting: Orthopaedic Surgery

## 2021-06-25 NOTE — Telephone Encounter (Signed)
Patient is flying out of town in the morning and has misplaced her card stating she has metal in her body from a TKR. She is wondering if ti would be possible to get a copy of the card or form emailed to her. Please follow up with patient.  [email or web address]

## 2022-03-12 ENCOUNTER — Ambulatory Visit (INDEPENDENT_AMBULATORY_CARE_PROVIDER_SITE_OTHER): Payer: Medicaid Other | Admitting: Orthopaedic Surgery

## 2022-03-12 ENCOUNTER — Ambulatory Visit (INDEPENDENT_AMBULATORY_CARE_PROVIDER_SITE_OTHER): Payer: Medicaid Other

## 2022-03-12 ENCOUNTER — Encounter: Payer: Self-pay | Admitting: Orthopaedic Surgery

## 2022-03-12 DIAGNOSIS — M25562 Pain in left knee: Secondary | ICD-10-CM | POA: Diagnosis not present

## 2022-03-12 MED ORDER — LIDOCAINE HCL 1 % IJ SOLN
2.0000 mL | INTRAMUSCULAR | Status: AC | PRN
  Administered 2022-03-12: 2 mL

## 2022-03-12 MED ORDER — BUPIVACAINE HCL 0.5 % IJ SOLN
2.0000 mL | INTRAMUSCULAR | Status: AC | PRN
  Administered 2022-03-12: 2 mL via INTRA_ARTICULAR

## 2022-03-12 MED ORDER — METHYLPREDNISOLONE ACETATE 40 MG/ML IJ SUSP
40.0000 mg | INTRAMUSCULAR | Status: AC | PRN
  Administered 2022-03-12: 40 mg via INTRA_ARTICULAR

## 2022-03-12 NOTE — Progress Notes (Signed)
Office Visit Note   Patient: [name] [name]           Date of Birth: 26-Oct-1980           MRN: [id number] Visit Date: 03/12/2022              Requested by: Carylon Perches, NP [place],  [place] [address] PCP: Carylon Perches, NP   Assessment & Plan: Visit Diagnoses:  1. Acute pain of left knee     Plan: Impression is left knee medial meniscal tear.  We have seen her for this issue before which was about 3 years ago.  Cortisone injection did help.  Based on her options she would like to try another cortisone injection but since symptoms have returned and she would also like to get an MRI to obtain more information on the meniscal tear.  I will communicate with her through [facility] with the results.  Follow-Up Instructions: No follow-ups on file.   Orders:  Orders Placed This Encounter  Procedures   XR KNEE 3 VIEW LEFT   No orders of the defined types were placed in this encounter.     Procedures: Large Joint Inj: L knee on 03/12/2022 10:46 AM Details: 22 G needle Medications: 2 mL bupivacaine 0.5 %; 2 mL lidocaine 1 %; 40 mg methylPREDNISolone acetate 40 MG/ML Outcome: tolerated well, no immediate complications Patient was prepped and draped in the usual sterile fashion.       Clinical Data: No additional findings.   Subjective: Chief Complaint  Patient presents with   Left Knee - Pain    HPI [name] [name] is a 41 year old female well-known to me comes in for left knee pain for a week.  Feels like she twisted her knee and now with swelling.  We saw her for similar issue a few years ago and cortisone injection was administered which really helped with the pain.  Denies any locking catching.  She has medial pain with activity and weightbearing.  Review of Systems  Constitutional: Negative.   HENT: Negative.    Eyes: Negative.   Respiratory: Negative.    Cardiovascular: Negative.   Endocrine: Negative.   Musculoskeletal: Negative.   Neurological:  Negative.   Hematological: Negative.   Psychiatric/Behavioral: Negative.    All other systems reviewed and are negative.    Objective: Vital Signs: LMP 05/09/2013   Physical Exam Vitals and nursing note reviewed.  Constitutional:      Appearance: She is well-developed.  HENT:     Head: Normocephalic and atraumatic.  Pulmonary:     Effort: Pulmonary effort is normal.  Abdominal:     Palpations: Abdomen is soft.  Musculoskeletal:     Cervical back: Neck supple.  Skin:    General: Skin is warm.     Capillary Refill: Capillary refill takes less than 2 seconds.  Neurological:     Mental Status: She is alert and oriented to person, place, and time.  Psychiatric:        Behavior: Behavior normal.        Thought Content: Thought content normal.        Judgment: Judgment normal.     Ortho Exam Examination left knee shows a trace effusion.  Medial joint line tenderness and positive McMurray.  Collaterals and cruciates are stable.  Specialty Comments:  No specialty comments available.  Imaging: No results found.   PMFS History: Patient Active Problem List   Diagnosis Date Noted   Lumbar radiculopathy 01/20/2020  Status post total right knee replacement 08/08/2019   Pelvic pain in female 08/26/2017   Pelvic peritoneal adhesions, female 08/26/2017   Endometriosis of pelvis 08/26/2017   Unilateral primary osteoarthritis, right knee 06/09/2017   Unilateral primary osteoarthritis, left knee 06/09/2017   Family history of breast cancer in mother 12/06/2015   Menorrhagia 05/31/2013   Dysmenorrhea 05/31/2013   Dyspareunia 05/31/2013   Asthma 08/05/2011   Past Medical History:  Diagnosis Date   Allergy    Arthritis    right knee   Asthma    Constipation    Depression    GERD (gastroesophageal reflux disease)    History of kidney stones    Panic disorder    Pneumonia    Renal disorder    Seasonal allergies    SVD (spontaneous vaginal delivery)    x 3    Family  History  Problem Relation Age of Onset   Hypertension Mother    Diabetes Mother    Breast cancer Mother 53       IDC; s/p lumpectomy, radiation, and arimidex   Other Sister        both full sisters also have dense breast tissue   Breast cancer Maternal Grandmother 83       IDC   Colon polyps Maternal Grandmother        approx 4-5 colon polyps   Prostate cancer Paternal Grandfather        dx. 36s-60s   Cancer Cousin 25       paternal 1st cousin dx. w/ either throat or thyroid cancer; not a smoker   Pancreatic cancer Other        maternal great aunt (MGF's sister) d. pancreatic cancer   Cancer Other        maternal great aunt (MGF's sister) dx NOS cancer   Cancer Other        maternal great grandmother (MGF's mother) d. NOS cancer in her 37s   Breast cancer Other        paternal great aunt (PGF's sister) dx. breast cancer, 38s   Colon cancer Neg Hx    Rectal cancer Neg Hx    Stomach cancer Neg Hx    Esophageal cancer Neg Hx     Past Surgical History:  Procedure Laterality Date   ABDOMINAL HYSTERECTOMY     [place]     BILATERAL SALPINGECTOMY Bilateral 05/31/2013   Procedure: BILATERAL SALPINGECTOMY;  Surgeon: Cheri Fowler, MD;  Location: [facility];  Service: Gynecology;  Laterality: Bilateral;   BREAST CYST ASPIRATION Right 2015   BREAST CYST EXCISION Right    2015   COLONOSCOPY     DILATION AND CURETTAGE OF UTERUS     [facility]     KIDNEY STONE SURGERY     KNEE ARTHROSCOPY Right    KNEE ARTHROSCOPY WITH DRILLING/MICROFRACTURE Right 07/26/2014   Procedure: RIGHT KNEE ARTHROSCOPY WITH CHONDROPLASTY, MICROFRACTURE;  Surgeon: Marianna Payment, MD;  Location: [facility];  Service: Orthopedics;  Laterality: Right;   KNEE ARTHROSCOPY WITH MEDIAL MENISECTOMY Right 07/26/2014   Procedure: KNEE ARTHROSCOPY WITH MEDIAL MENISECTOMY;  Surgeon: Marianna Payment, MD;  Location: [facility];  Service: Orthopedics;  Laterality: Right;   LAPAROSCOPIC ASSISTED  VAGINAL HYSTERECTOMY N/A 05/31/2013   Procedure: LAPAROSCOPIC ASSISTED VAGINAL HYSTERECTOMY;  Surgeon: Cheri Fowler, MD;  Location: [facility];  Service: Gynecology;  Laterality: N/A;   LAPAROSCOPY N/A 08/26/2017   Procedure: LAPAROSCOPY OPERATIVE WITH PERIOTONEAL BIOPSY AND FULGERATION OF ENDOMETRIOSIS;  Surgeon:  Meisinger, Sherren Mocha, MD;  Location: [facility];  Service: Gynecology;  Laterality: N/A;   LUMBAR LAMINECTOMY/DECOMPRESSION MICRODISCECTOMY Right 12/31/2018   Procedure: MICRODISCECTOMY LUMBAR 5- SACRAL1;  Surgeon: Consuella Lose, MD;  Location: [facility];  Service: Neurosurgery;  Laterality: Right;  MICRODISCECTOMY LUMBAR 5- SACRAL1   PARTIAL HYSTERECTOMY     TONSILLECTOMY     TONSILLECTOMY AND ADENOIDECTOMY     TOTAL KNEE ARTHROPLASTY Right 08/08/2019   TOTAL KNEE ARTHROPLASTY Right 08/08/2019   Procedure: RIGHT TOTAL KNEE ARTHROPLASTY;  Surgeon: Leandrew Koyanagi, MD;  Location: [facility];  Service: Orthopedics;  Laterality: Right;   TUBAL LIGATION     UPPER GI ENDOSCOPY     ulcer   Social History   Occupational History   Not on file  Tobacco Use   Smoking status: Never   Smokeless tobacco: Never  Vaping Use   Vaping Use: Never used  Substance and Sexual Activity   Alcohol use: No    Alcohol/week: 0.0 standard drinks of alcohol   Drug use: No   Sexual activity: Not on file

## 2022-03-30 ENCOUNTER — Ambulatory Visit
Admission: RE | Admit: 2022-03-30 | Discharge: 2022-03-30 | Disposition: A | Discharge status: Home / Self Care | Payer: Medicaid Other | Source: Ambulatory Visit | Attending: Orthopaedic Surgery | Admitting: Orthopaedic Surgery

## 2022-03-30 DIAGNOSIS — M25562 Pain in left knee: Secondary | ICD-10-CM

## 2022-04-16 ENCOUNTER — Other Ambulatory Visit (HOSPITAL_COMMUNITY): Payer: Self-pay | Admitting: Family Medicine

## 2022-04-16 DIAGNOSIS — R0789 Other chest pain: Secondary | ICD-10-CM

## 2022-04-23 ENCOUNTER — Ambulatory Visit (HOSPITAL_COMMUNITY)
Admission: RE | Admit: 2022-04-23 | Discharge: 2022-04-23 | Disposition: A | Discharge status: Home / Self Care | Payer: Medicaid Other | Source: Ambulatory Visit | Attending: Family Medicine | Admitting: Family Medicine

## 2022-04-23 DIAGNOSIS — R0789 Other chest pain: Secondary | ICD-10-CM | POA: Insufficient documentation

## 2022-04-24 ENCOUNTER — Encounter: Payer: Self-pay | Admitting: Orthopaedic Surgery

## 2022-04-24 ENCOUNTER — Ambulatory Visit (INDEPENDENT_AMBULATORY_CARE_PROVIDER_SITE_OTHER): Payer: Medicaid Other | Admitting: Orthopaedic Surgery

## 2022-04-24 DIAGNOSIS — S83242A Other tear of medial meniscus, current injury, left knee, initial encounter: Secondary | ICD-10-CM

## 2022-04-24 NOTE — Progress Notes (Signed)
Office Visit Note   Patient: [name] [name]           Date of Birth: 1980/12/30           MRN: [id number] Visit Date: 04/24/2022              Requested by: Carylon Perches, NP [place],  [place] [address] PCP: Carylon Perches, NP   Assessment & Plan: Visit Diagnoses:  1. Acute medial meniscus tear of left knee, initial encounter     Plan: Impression is symptomatic left medial meniscus tear.  Unfortunately cortisone injection was not effective.  Based on her options she would like to move forward with left knee arthroscopy and partial medial meniscectomy.  Questions encouraged and answered.  Jackelyn Poling will call the patient to schedule for the near future.  Follow-Up Instructions: No follow-ups on file.   Orders:  No orders of the defined types were placed in this encounter.  No orders of the defined types were placed in this encounter.     Procedures: No procedures performed   Clinical Data: No additional findings.   Subjective: Chief Complaint  Patient presents with   Left Knee - Pain    HPI Tobi returns today for chronic left knee pain.  She had an MRI earlier this year that showed a medial meniscal tear.  We last saw her about 6 to 7 weeks ago and did a cortisone injection.  She states that it was not effective.  She is having a lot of trouble with pivoting and turning and changing directions. Review of Systems  Constitutional: Negative.   HENT: Negative.    Eyes: Negative.   Respiratory: Negative.    Cardiovascular: Negative.   Endocrine: Negative.   Musculoskeletal: Negative.   Neurological: Negative.   Hematological: Negative.   Psychiatric/Behavioral: Negative.    All other systems reviewed and are negative.    Objective: Vital Signs: LMP 05/09/2013   Physical Exam Vitals and nursing note reviewed.  Constitutional:      Appearance: She is well-developed.  HENT:     Head: Atraumatic.     Nose: Nose normal.  Eyes:      Extraocular Movements: Extraocular movements intact.  Cardiovascular:     Pulses: Normal pulses.  Pulmonary:     Effort: Pulmonary effort is normal.  Abdominal:     Palpations: Abdomen is soft.  Musculoskeletal:     Cervical back: Neck supple.  Skin:    General: Skin is warm.     Capillary Refill: Capillary refill takes less than 2 seconds.  Neurological:     Mental Status: She is alert. Mental status is at baseline.  Psychiatric:        Behavior: Behavior normal.        Thought Content: Thought content normal.        Judgment: Judgment normal.     Ortho Exam Examination of the left knee shows medial joint line tenderness.  Trace effusion. Specialty Comments:  No specialty comments available.  Imaging: No results found.   PMFS History: Patient Active Problem List   Diagnosis Date Noted   Acute medial meniscus tear of left knee 04/24/2022   Lumbar radiculopathy 01/20/2020   Status post total right knee replacement 08/08/2019   Pelvic pain in female 08/26/2017   Pelvic peritoneal adhesions, female 08/26/2017   Endometriosis of pelvis 08/26/2017   Unilateral primary osteoarthritis, right knee 06/09/2017   Unilateral primary osteoarthritis, left knee 06/09/2017   Family history of  breast cancer in mother 12/06/2015   Menorrhagia 05/31/2013   Dysmenorrhea 05/31/2013   Dyspareunia 05/31/2013   Asthma 08/05/2011   Past Medical History:  Diagnosis Date   Allergy    Arthritis    right knee   Asthma    Constipation    Depression    GERD (gastroesophageal reflux disease)    History of kidney stones    Panic disorder    Pneumonia    Renal disorder    Seasonal allergies    SVD (spontaneous vaginal delivery)    x 3    Family History  Problem Relation Age of Onset   Hypertension Mother    Diabetes Mother    Breast cancer Mother 58       IDC; s/p lumpectomy, radiation, and arimidex   Other Sister        both full sisters also have dense breast tissue   Breast  cancer Maternal Grandmother 83       IDC   Colon polyps Maternal Grandmother        approx 4-5 colon polyps   Prostate cancer Paternal Grandfather        dx. 63s-60s   Cancer Cousin 25       paternal 1st cousin dx. w/ either throat or thyroid cancer; not a smoker   Pancreatic cancer Other        maternal great aunt (MGF's sister) d. pancreatic cancer   Cancer Other        maternal great aunt (MGF's sister) dx NOS cancer   Cancer Other        maternal great grandmother (MGF's mother) d. NOS cancer in her 68s   Breast cancer Other        paternal great aunt (PGF's sister) dx. breast cancer, 36s   Colon cancer Neg Hx    Rectal cancer Neg Hx    Stomach cancer Neg Hx    Esophageal cancer Neg Hx     Past Surgical History:  Procedure Laterality Date   ABDOMINAL HYSTERECTOMY     [place]     BILATERAL SALPINGECTOMY Bilateral 05/31/2013   Procedure: BILATERAL SALPINGECTOMY;  Surgeon: Cheri Fowler, MD;  Location: [facility];  Service: Gynecology;  Laterality: Bilateral;   BREAST CYST ASPIRATION Right 2015   BREAST CYST EXCISION Right    2015   COLONOSCOPY     DILATION AND CURETTAGE OF UTERUS     [facility]     KIDNEY STONE SURGERY     KNEE ARTHROSCOPY Right    KNEE ARTHROSCOPY WITH DRILLING/MICROFRACTURE Right 07/26/2014   Procedure: RIGHT KNEE ARTHROSCOPY WITH CHONDROPLASTY, MICROFRACTURE;  Surgeon: Marianna Payment, MD;  Location: [facility];  Service: Orthopedics;  Laterality: Right;   KNEE ARTHROSCOPY WITH MEDIAL MENISECTOMY Right 07/26/2014   Procedure: KNEE ARTHROSCOPY WITH MEDIAL MENISECTOMY;  Surgeon: Marianna Payment, MD;  Location: [facility];  Service: Orthopedics;  Laterality: Right;   LAPAROSCOPIC ASSISTED VAGINAL HYSTERECTOMY N/A 05/31/2013   Procedure: LAPAROSCOPIC ASSISTED VAGINAL HYSTERECTOMY;  Surgeon: Cheri Fowler, MD;  Location: [facility];  Service: Gynecology;  Laterality: N/A;   LAPAROSCOPY N/A 08/26/2017   Procedure: LAPAROSCOPY OPERATIVE  WITH PERIOTONEAL BIOPSY AND FULGERATION OF ENDOMETRIOSIS;  Surgeon: Cheri Fowler, MD;  Location: [facility];  Service: Gynecology;  Laterality: N/A;   LUMBAR LAMINECTOMY/DECOMPRESSION MICRODISCECTOMY Right 12/31/2018   Procedure: MICRODISCECTOMY LUMBAR 5- SACRAL1;  Surgeon: Consuella Lose, MD;  Location: [facility];  Service: Neurosurgery;  Laterality: Right;  MICRODISCECTOMY LUMBAR 5- SACRAL1  PARTIAL HYSTERECTOMY     TONSILLECTOMY     TONSILLECTOMY AND ADENOIDECTOMY     TOTAL KNEE ARTHROPLASTY Right 08/08/2019   TOTAL KNEE ARTHROPLASTY Right 08/08/2019   Procedure: RIGHT TOTAL KNEE ARTHROPLASTY;  Surgeon: Leandrew Koyanagi, MD;  Location: [facility];  Service: Orthopedics;  Laterality: Right;   TUBAL LIGATION     UPPER GI ENDOSCOPY     ulcer   Social History   Occupational History   Not on file  Tobacco Use   Smoking status: Never   Smokeless tobacco: Never  Vaping Use   Vaping Use: Never used  Substance and Sexual Activity   Alcohol use: No    Alcohol/week: 0.0 standard drinks of alcohol   Drug use: No   Sexual activity: Not on file

## 2022-04-29 ENCOUNTER — Encounter (HOSPITAL_BASED_OUTPATIENT_CLINIC_OR_DEPARTMENT_OTHER): Payer: Self-pay | Admitting: Orthopaedic Surgery

## 2022-04-30 ENCOUNTER — Other Ambulatory Visit: Payer: Self-pay

## 2022-04-30 ENCOUNTER — Encounter (HOSPITAL_BASED_OUTPATIENT_CLINIC_OR_DEPARTMENT_OTHER): Payer: Self-pay | Admitting: Orthopaedic Surgery

## 2022-04-30 NOTE — Progress Notes (Signed)
   04/30/22 0949  PAT Phone Screen  Do You Have Diabetes? No  Do You Have Hypertension? No  Have You Ever Been to the ER for Asthma? No  Have You Taken Oral Steroids in the Past 3 Months? No  Do you Take Phenteramine or any Other Diet Drugs? No  Recent  Lab Work, EKG, CXR? Yes  Do you have a history of heart problems? No  Have You Ever Had Tests on Your Heart? Yes (04/18/22 preventative cardic CT - WNL see EPIC results)  Any Recent Hospitalizations? No  Height '5\' 2"'$  (1.575 m)  Weight 56.7 kg  Pat Appointment Scheduled Yes

## 2022-05-06 NOTE — Progress Notes (Signed)

## 2022-05-07 ENCOUNTER — Encounter: Payer: Self-pay | Admitting: Orthopaedic Surgery

## 2022-05-07 ENCOUNTER — Ambulatory Visit (HOSPITAL_BASED_OUTPATIENT_CLINIC_OR_DEPARTMENT_OTHER)
Admission: RE | Admit: 2022-05-07 | Discharge: 2022-05-07 | Disposition: A | Discharge status: Home / Self Care | Payer: Medicaid Other | Attending: Orthopaedic Surgery | Admitting: Orthopaedic Surgery

## 2022-05-07 ENCOUNTER — Encounter (HOSPITAL_BASED_OUTPATIENT_CLINIC_OR_DEPARTMENT_OTHER)
Admission: RE | Disposition: A | Discharge status: Home / Self Care | Payer: Self-pay | Source: Home / Self Care | Attending: Orthopaedic Surgery

## 2022-05-07 ENCOUNTER — Ambulatory Visit (HOSPITAL_BASED_OUTPATIENT_CLINIC_OR_DEPARTMENT_OTHER): Payer: Medicaid Other | Admitting: Anesthesiology

## 2022-05-07 ENCOUNTER — Encounter (HOSPITAL_BASED_OUTPATIENT_CLINIC_OR_DEPARTMENT_OTHER): Payer: Self-pay | Admitting: Orthopaedic Surgery

## 2022-05-07 ENCOUNTER — Other Ambulatory Visit: Payer: Self-pay

## 2022-05-07 DIAGNOSIS — X58XXXA Exposure to other specified factors, initial encounter: Secondary | ICD-10-CM | POA: Insufficient documentation

## 2022-05-07 DIAGNOSIS — S83512A Sprain of anterior cruciate ligament of left knee, initial encounter: Secondary | ICD-10-CM

## 2022-05-07 DIAGNOSIS — F32A Depression, unspecified: Secondary | ICD-10-CM | POA: Diagnosis not present

## 2022-05-07 DIAGNOSIS — G8929 Other chronic pain: Secondary | ICD-10-CM | POA: Insufficient documentation

## 2022-05-07 DIAGNOSIS — M79606 Pain in leg, unspecified: Secondary | ICD-10-CM | POA: Insufficient documentation

## 2022-05-07 DIAGNOSIS — J449 Chronic obstructive pulmonary disease, unspecified: Secondary | ICD-10-CM | POA: Insufficient documentation

## 2022-05-07 DIAGNOSIS — K219 Gastro-esophageal reflux disease without esophagitis: Secondary | ICD-10-CM | POA: Insufficient documentation

## 2022-05-07 DIAGNOSIS — Y939 Activity, unspecified: Secondary | ICD-10-CM | POA: Insufficient documentation

## 2022-05-07 DIAGNOSIS — M1990 Unspecified osteoarthritis, unspecified site: Secondary | ICD-10-CM | POA: Insufficient documentation

## 2022-05-07 DIAGNOSIS — S83242A Other tear of medial meniscus, current injury, left knee, initial encounter: Secondary | ICD-10-CM | POA: Insufficient documentation

## 2022-05-07 DIAGNOSIS — M549 Dorsalgia, unspecified: Secondary | ICD-10-CM | POA: Diagnosis not present

## 2022-05-07 DIAGNOSIS — Z01818 Encounter for other preprocedural examination: Secondary | ICD-10-CM

## 2022-05-07 DIAGNOSIS — M94262 Chondromalacia, left knee: Secondary | ICD-10-CM | POA: Diagnosis not present

## 2022-05-07 DIAGNOSIS — M659 Synovitis and tenosynovitis, unspecified: Secondary | ICD-10-CM

## 2022-05-07 DIAGNOSIS — F419 Anxiety disorder, unspecified: Secondary | ICD-10-CM | POA: Diagnosis not present

## 2022-05-07 HISTORY — PX: KNEE ARTHROSCOPY WITH MEDIAL MENISECTOMY: SHX5651

## 2022-05-07 SURGERY — ARTHROSCOPY, KNEE, WITH MEDIAL MENISCECTOMY
Anesthesia: General | Site: Knee | Laterality: Left

## 2022-05-07 MED ORDER — ACETAMINOPHEN 325 MG PO TABS: 325.0000 mg | ORAL_TABLET | ORAL | Status: DC | PRN

## 2022-05-07 MED ORDER — CEFAZOLIN SODIUM-DEXTROSE 2-4 GM/100ML-% IV SOLN
2.0000 g | INTRAVENOUS | Status: AC
  Administered 2022-05-07: 2 g via INTRAVENOUS

## 2022-05-07 MED ORDER — ONDANSETRON HCL 4 MG/2ML IJ SOLN
INTRAMUSCULAR | Status: DC | PRN
  Administered 2022-05-07: 4 mg via INTRAVENOUS

## 2022-05-07 MED ORDER — DEXAMETHASONE SODIUM PHOSPHATE 10 MG/ML IJ SOLN
INTRAMUSCULAR | Status: DC | PRN
  Administered 2022-05-07: 5 mg via INTRAVENOUS

## 2022-05-07 MED ORDER — PROPOFOL 10 MG/ML IV BOLUS
INTRAVENOUS | Status: DC | PRN
  Administered 2022-05-07: 130 mg via INTRAVENOUS
  Administered 2022-05-07: 20 mg via INTRAVENOUS
  Administered 2022-05-07: 50 mg via INTRAVENOUS

## 2022-05-07 MED ORDER — ONDANSETRON HCL 4 MG/2ML IJ SOLN: 4.0000 mg | Freq: Once | INTRAMUSCULAR | Status: DC | PRN

## 2022-05-07 MED ORDER — FENTANYL CITRATE (PF) 100 MCG/2ML IJ SOLN
INTRAMUSCULAR | Status: AC
  Filled 2022-05-07: qty 2

## 2022-05-07 MED ORDER — CEFAZOLIN SODIUM-DEXTROSE 2-4 GM/100ML-% IV SOLN
INTRAVENOUS | Status: AC
  Filled 2022-05-07: qty 100

## 2022-05-07 MED ORDER — MIDAZOLAM HCL 2 MG/2ML IJ SOLN
INTRAMUSCULAR | Status: AC
  Filled 2022-05-07: qty 2

## 2022-05-07 MED ORDER — FENTANYL CITRATE (PF) 100 MCG/2ML IJ SOLN: 25.0000 ug | INTRAMUSCULAR | Status: DC | PRN

## 2022-05-07 MED ORDER — MIDAZOLAM HCL 5 MG/5ML IJ SOLN
INTRAMUSCULAR | Status: DC | PRN
  Administered 2022-05-07: 2 mg via INTRAVENOUS

## 2022-05-07 MED ORDER — LACTATED RINGERS IV SOLN: INTRAVENOUS | Status: DC

## 2022-05-07 MED ORDER — ACETAMINOPHEN 160 MG/5ML PO SOLN: 325.0000 mg | ORAL | Status: DC | PRN

## 2022-05-07 MED ORDER — SODIUM CHLORIDE 0.9 % IR SOLN
Status: DC | PRN
  Administered 2022-05-07: 500 mL

## 2022-05-07 MED ORDER — LIDOCAINE 2% (20 MG/ML) 5 ML SYRINGE
INTRAMUSCULAR | Status: DC | PRN
  Administered 2022-05-07: 100 mg via INTRAVENOUS

## 2022-05-07 MED ORDER — MEPERIDINE HCL 25 MG/ML IJ SOLN: 6.2500 mg | INTRAMUSCULAR | Status: DC | PRN

## 2022-05-07 MED ORDER — PROPOFOL 500 MG/50ML IV EMUL
INTRAVENOUS | Status: DC | PRN
  Administered 2022-05-07: 25 ug/kg/min via INTRAVENOUS

## 2022-05-07 MED ORDER — HYDROCODONE-ACETAMINOPHEN 5-325 MG PO TABS: 1.0000 | ORAL_TABLET | Freq: Four times a day (QID) | ORAL | 0 refills | Status: DC | PRN

## 2022-05-07 MED ORDER — ACETAMINOPHEN 500 MG PO TABS: 1000.0000 mg | ORAL_TABLET | Freq: Once | ORAL | Status: DC

## 2022-05-07 MED ORDER — OXYCODONE HCL 5 MG/5ML PO SOLN: 5.0000 mg | Freq: Once | ORAL | Status: DC | PRN

## 2022-05-07 MED ORDER — BUPIVACAINE HCL (PF) 0.25 % IJ SOLN
INTRAMUSCULAR | Status: DC | PRN
  Administered 2022-05-07: 20 mL

## 2022-05-07 MED ORDER — ONDANSETRON HCL 4 MG PO TABS: 4.0000 mg | ORAL_TABLET | Freq: Three times a day (TID) | ORAL | 0 refills | Status: DC | PRN

## 2022-05-07 MED ORDER — BUPIVACAINE HCL (PF) 0.25 % IJ SOLN
INTRAMUSCULAR | Status: AC
  Filled 2022-05-07: qty 30

## 2022-05-07 MED ORDER — OXYCODONE HCL 5 MG PO TABS: 5.0000 mg | ORAL_TABLET | Freq: Once | ORAL | Status: DC | PRN

## 2022-05-07 MED ORDER — FENTANYL CITRATE (PF) 100 MCG/2ML IJ SOLN
INTRAMUSCULAR | Status: DC | PRN
  Administered 2022-05-07 (×3): 50 ug via INTRAVENOUS

## 2022-05-07 MED ORDER — BUPIVACAINE HCL (PF) 0.5 % IJ SOLN
INTRAMUSCULAR | Status: AC
  Filled 2022-05-07: qty 30

## 2022-05-07 SURGICAL SUPPLY — 38 items
BANDAGE ESMARK 6X9 LF (GAUZE/BANDAGES/DRESSINGS) IMPLANT
BLADE EXCALIBUR 4.0X13 (MISCELLANEOUS) IMPLANT
BLADE SHAVER TORPEDO 4X13 (MISCELLANEOUS) ×1 IMPLANT
BNDG CMPR 9X6 STRL LF SNTH (GAUZE/BANDAGES/DRESSINGS)
BNDG ELASTIC 6X5.8 VLCR STR LF (GAUZE/BANDAGES/DRESSINGS) ×2 IMPLANT
BNDG ESMARK 6X9 LF (GAUZE/BANDAGES/DRESSINGS)
BURR OVAL 8 FLU 4.0X13 (MISCELLANEOUS) IMPLANT
COOLER ICEMAN CLASSIC (MISCELLANEOUS) ×1 IMPLANT
CUFF TOURN SGL QUICK 34 (TOURNIQUET CUFF) ×1
CUFF TRNQT CYL 34X4.125X (TOURNIQUET CUFF) ×1 IMPLANT
CUTTER BONE 4.0MM X 13CM (MISCELLANEOUS) IMPLANT
DISSECTOR 3.5MM X 13CM CVD (MISCELLANEOUS) IMPLANT
DRAPE ARTHROSCOPY W/POUCH 90 (DRAPES) ×1 IMPLANT
DRAPE IMP U-DRAPE 54X76 (DRAPES) ×1 IMPLANT
DRAPE U-SHAPE 47X51 STRL (DRAPES) ×1 IMPLANT
DURAPREP 26ML APPLICATOR (WOUND CARE) ×2 IMPLANT
ELECT MENISCUS 165MM 90D (ELECTRODE) IMPLANT
EXCALIBUR 3.8MM X 13CM (MISCELLANEOUS) IMPLANT
GAUZE SPONGE 4X4 12PLY STRL (GAUZE/BANDAGES/DRESSINGS) ×1 IMPLANT
GAUZE XEROFORM 1X8 LF (GAUZE/BANDAGES/DRESSINGS) ×1 IMPLANT
GLOVE ECLIPSE 7.0 STRL STRAW (GLOVE) ×2 IMPLANT
GLOVE INDICATOR 7.0 STRL GRN (GLOVE) ×2 IMPLANT
GLOVE INDICATOR 7.5 STRL GRN (GLOVE) ×1 IMPLANT
GLOVE SURG SYN 7.5  E (GLOVE) ×2
GLOVE SURG SYN 7.5 E (GLOVE) ×2 IMPLANT
GLOVE SURG SYN 7.5 PF PI (GLOVE) ×1 IMPLANT
GOWN STRL REUS W/ TWL LRG LVL3 (GOWN DISPOSABLE) ×1 IMPLANT
GOWN STRL REUS W/TWL LRG LVL3 (GOWN DISPOSABLE)
GOWN STRL SURGICAL XL XLNG (GOWN DISPOSABLE) ×2 IMPLANT
MANIFOLD NEPTUNE II (INSTRUMENTS) ×1 IMPLANT
PACK ARTHROSCOPY DSU (CUSTOM PROCEDURE TRAY) ×1 IMPLANT
PACK BASIN DAY SURGERY FS (CUSTOM PROCEDURE TRAY) ×1 IMPLANT
PAD COLD SHLDR WRAP-ON (PAD) ×1 IMPLANT
PENCIL SMOKE EVACUATOR (MISCELLANEOUS) IMPLANT
SHEET MEDIUM DRAPE 40X70 STRL (DRAPES) ×1 IMPLANT
SUT ETHILON 3 0 PS 1 (SUTURE) ×1 IMPLANT
TOWEL GREEN STERILE FF (TOWEL DISPOSABLE) ×1 IMPLANT
TUBING ARTHROSCOPY IRRIG 16FT (MISCELLANEOUS) ×1 IMPLANT

## 2022-05-07 NOTE — Op Note (Signed)
   Surgery Date: 05/07/2022  PREOPERATIVE DIAGNOSES:  1. Left knee medial meniscus tear 2. Left knee synovitis  POSTOPERATIVE DIAGNOSES:  same  PROCEDURES PERFORMED:  1. Left knee arthroscopy with major synovectomy 2. Left knee arthroscopy with arthroscopic partial medial meniscectomy 3. Left knee arthroscopy with arthroscopic chondroplasty of patellar surface  SURGEON: N. Eduard Roux, M.D.  ASSIST: Ciro Backer [place], [place]; necessary for the timely completion of procedure and due to complexity of procedure.  ANESTHESIA:  general  FLUIDS: Per anesthesia record.   ESTIMATED BLOOD LOSS: minimal  DESCRIPTION OF PROCEDURE: [name] is a 41 y.o.-year-old female with above mentioned conditions. Full discussion held regarding risks benefits alternatives and complications related surgical intervention. Conservative care options reviewed. All questions answered.  The patient was identified in the preoperative holding area and the operative extremity was marked. The patient was brought to the operating room and transferred to operating table in a supine position. Satisfactory general anesthesia was induced by anesthesiology.    Standard anterolateral, anteromedial arthroscopy portals were obtained. The anteromedial portal was obtained with a spinal needle for localization under direct visualization with subsequent diagnostic findings.   Major synovectomy was first performed after encountering moderate synovitis in the gutters and the suprapatellar pouch.  We then placed a gentle valgus force on the knee and addressed the medial compartment.  There was grade 2 changes on the medial femoral condyle.  Grade 1 changes of the medial tibial plateau.  There was some degenerative fraying of the mid body of the medial meniscus.  We did find the horizontal tear of the posterior horn.  Partial medial meniscectomy was performed with a meniscus basket and also shaver back to stable margins.  Less than 10%  of the volume was removed.  The meniscal root was intact.  Cruciates were unremarkable.  Lateral compartment showed grade 1 changes.  No lateral meniscus pathology.  The knee was then placed into full extension and there was a central stripe down the trough of the trochlea that was near grade IV chondromalacia.  The medial patellar facet demonstrated some grade 2-3 changes.  Gutters were checked for loose bodies.  Excess fluid was removed from the knee joint.  Incisions were closed with interrupted nylon sutures.  Sterile dressings were applied.  Patient tolerated procedure well had no immediate complications.  Suprapatellar pouch and gutters: moderate synovitis or debris. Patella chondral surface: Grade 2-3 Trochlear chondral surface: Grade 4 trough of the trochlea Patellofemoral tracking: Slightly lateral Medial meniscus: Small horizontal tear posterior horn.  Medial femoral condyle weight bearing surface: Grade 2 Medial tibial plateau: Grade 1 Anterior cruciate ligament:stable Posterior cruciate ligament:stable Lateral meniscus: Normal.   Lateral femoral condyle weight bearing surface: Grade 1 Lateral tibial plateau: Grade 1  DISPOSITION: The patient was awakened from general anesthetic, extubated, taken to the recovery room in medically stable condition, no apparent complications. The patient may be weightbearing as tolerated to the operative lower extremity.  Range of motion of right knee as tolerated.  Azucena Cecil, MD [facility] 2:37 PM

## 2022-05-07 NOTE — Anesthesia Procedure Notes (Signed)
Procedure Name: LMA Insertion Date/Time: 05/07/2022 2:04 PM  Performed by: Ezequiel Kayser, CRNAPre-anesthesia Checklist: Patient identified, Emergency Drugs available, Suction available and Patient being monitored Patient Re-evaluated:Patient Re-evaluated prior to induction Oxygen Delivery Method: Circle System Utilized Preoxygenation: Pre-oxygenation with 100% oxygen Induction Type: IV induction Ventilation: Mask ventilation without difficulty LMA: LMA inserted LMA Size: 4.0 Number of attempts: 1 Airway Equipment and Method: Bite block Placement Confirmation: positive ETCO2 Tube secured with: Tape Dental Injury: Teeth and Oropharynx as per pre-operative assessment

## 2022-05-07 NOTE — Transfer of Care (Signed)
Immediate Anesthesia Transfer of Care Note  Patient: [name] [name]  Procedure(s) Performed: LEFT KNEE ARTHROSCOPY WITH DEBRIDEMENT PARTIAL MEDIAL MENISCECTOMY (Left: Knee)  Patient Location: PACU  Anesthesia Type:General  Level of Consciousness: awake, alert , oriented and patient cooperative  Airway & Oxygen Therapy: Patient Spontanous Breathing and Patient connected to face mask oxygen  Post-op Assessment: Report given to RN and Post -op Vital signs reviewed and stable  Post vital signs: Reviewed and stable  Last Vitals:  Vitals Value Taken Time  BP    Temp    Pulse 86 05/07/22 1448  Resp    SpO2 100 % 05/07/22 1448  Vitals shown include unvalidated device data.  Last Pain:  Vitals:   05/07/22 1203  TempSrc: Oral  PainSc: 9          Complications: No notable events documented.

## 2022-05-07 NOTE — Discharge Instructions (Addendum)

## 2022-05-07 NOTE — Anesthesia Postprocedure Evaluation (Signed)
Anesthesia Post Note  Patient: [name] [name]  Procedure(s) Performed: LEFT KNEE ARTHROSCOPY WITH DEBRIDEMENT AND CHONDROPLASTY PARTIAL MEDIAL MENISCECTOMY (Left: Knee)     Patient location during evaluation: PACU Anesthesia Type: General Level of consciousness: awake and alert Pain management: pain level controlled Vital Signs Assessment: post-procedure vital signs reviewed and stable Respiratory status: spontaneous breathing, nonlabored ventilation, respiratory function stable and patient connected to nasal cannula oxygen Cardiovascular status: blood pressure returned to baseline and stable Postop Assessment: no apparent nausea or vomiting Anesthetic complications: no   No notable events documented.  Last Vitals:  Vitals:   05/07/22 1515 05/07/22 1543  BP: (!) 135/92 (!) 143/92  Pulse: 74 94  Resp: 16 16  Temp:  36.6 C  SpO2: 98% 99%    Last Pain:  Vitals:   05/07/22 1543  TempSrc: Oral  PainSc: 0-No pain                 Jupiter Kabir

## 2022-05-07 NOTE — H&P (Signed)
PREOPERATIVE H&P  Chief Complaint: LEFT KNEE MEDIAL MENISCAL TEAR  HPI: [name] [name] is a 41 y.o. female who presents for surgical treatment of LEFT KNEE [place].  She denies any changes in medical history.  Past Medical History:  Diagnosis Date   Allergy    Arthritis    right knee   Asthma    Constipation    Depression    GERD (gastroesophageal reflux disease)    History of kidney stones    Panic disorder    Pneumonia    Renal disorder    Seasonal allergies    SVD (spontaneous vaginal delivery)    x 3   Past Surgical History:  Procedure Laterality Date   BILATERAL SALPINGECTOMY Bilateral 05/31/2013   Procedure: BILATERAL SALPINGECTOMY;  Surgeon: Cheri Fowler, MD;  Location: [facility];  Service: Gynecology;  Laterality: Bilateral;   BREAST CYST ASPIRATION Right 2015   BREAST CYST EXCISION Right    2015   DILATION AND CURETTAGE OF UTERUS     KIDNEY STONE SURGERY     KNEE ARTHROSCOPY Right    KNEE ARTHROSCOPY WITH DRILLING/MICROFRACTURE Right 07/26/2014   Procedure: RIGHT KNEE ARTHROSCOPY WITH CHONDROPLASTY, MICROFRACTURE;  Surgeon: Marianna Payment, MD;  Location: [place];  Service: Orthopedics;  Laterality: Right;   KNEE ARTHROSCOPY WITH MEDIAL MENISECTOMY Right 07/26/2014   Procedure: KNEE ARTHROSCOPY WITH MEDIAL MENISECTOMY;  Surgeon: Marianna Payment, MD;  Location: [place];  Service: Orthopedics;  Laterality: Right;   LAPAROSCOPIC ASSISTED VAGINAL HYSTERECTOMY N/A 05/31/2013   Procedure: LAPAROSCOPIC ASSISTED VAGINAL HYSTERECTOMY;  Surgeon: Cheri Fowler, MD;  Location: [facility];  Service: Gynecology;  Laterality: N/A;   LAPAROSCOPY N/A 08/26/2017   Procedure: LAPAROSCOPY OPERATIVE WITH PERIOTONEAL BIOPSY AND FULGERATION OF ENDOMETRIOSIS;  Surgeon: Cheri Fowler, MD;  Location: [facility];  Service: Gynecology;  Laterality: N/A;   LUMBAR LAMINECTOMY/DECOMPRESSION MICRODISCECTOMY Right 12/31/2018   Procedure:  MICRODISCECTOMY LUMBAR 5- SACRAL1;  Surgeon: Consuella Lose, MD;  Location: [place];  Service: Neurosurgery;  Laterality: Right;  MICRODISCECTOMY LUMBAR 5- SACRAL1   TONSILLECTOMY     TONSILLECTOMY AND ADENOIDECTOMY     TOTAL KNEE ARTHROPLASTY Right 08/08/2019   Procedure: RIGHT TOTAL KNEE ARTHROPLASTY;  Surgeon: Leandrew Koyanagi, MD;  Location: [place];  Service: Orthopedics;  Laterality: Right;   TUBAL LIGATION     UPPER GI ENDOSCOPY     ulcer   Social History   Socioeconomic History   Marital status: Divorced    Spouse name: Not on file   Number of children: Not on file   Years of education: Not on file   Highest education level: Not on file  Occupational History   Not on file  Tobacco Use   Smoking status: Never   Smokeless tobacco: Never  Vaping Use   Vaping Use: Never used  Substance and Sexual Activity   Alcohol use: No    Alcohol/week: 0.0 standard drinks of alcohol   Drug use: No   Sexual activity: Not on file  Other Topics Concern   Not on file  Social History Narrative   Not on file   Social Determinants of Health   Financial Resource Strain: Not on file  Food Insecurity: Not on file  Transportation Needs: Not on file  Physical Activity: Not on file  Stress: Not on file  Social Connections: Not on file   Family History  Problem Relation Age of Onset   Hypertension Mother    Diabetes  Mother    Breast cancer Mother 22       IDC; s/p lumpectomy, radiation, and arimidex   Other Sister        both full sisters also have dense breast tissue   Breast cancer Maternal Grandmother 83       IDC   Colon polyps Maternal Grandmother        approx 4-5 colon polyps   Prostate cancer Paternal Grandfather        dx. 36s-60s   Cancer Cousin 25       paternal 1st cousin dx. w/ either throat or thyroid cancer; not a smoker   Pancreatic cancer Other        maternal great aunt (MGF's sister) d. pancreatic cancer   Cancer Other        maternal great aunt (MGF's sister)  dx NOS cancer   Cancer Other        maternal great grandmother (MGF's mother) d. NOS cancer in her 63s   Breast cancer Other        paternal great aunt (PGF's sister) dx. breast cancer, 68s   Colon cancer Neg Hx    Rectal cancer Neg Hx    Stomach cancer Neg Hx    Esophageal cancer Neg Hx    Allergies  Allergen Reactions   Duloxetine Hcl Nausea And Vomiting   Nitrofurantoin Monohyd Macro Hives and Shortness Of Breath   Aspirin     Bleeding ulcer   Morphine And Related Nausea And Vomiting   Prior to Admission medications   Medication Sig Start Date End Date Taking? Authorizing Provider  albuterol (VENTOLIN HFA) 108 (90 Base) MCG/ACT inhaler Inhale 2 puffs into the lungs every 6 (six) hours as needed for wheezing or shortness of breath. Patient taking differently: Inhale 2 puffs into the lungs 2 (two) times daily. Inhale 2 puffs twice daily after using advair doses, may inhale 2 puffs every 6 hours as needed for shortness of breath 02/02/19  Yes Hassell Done, Mary-Margaret, FNP  buPROPion (WELLBUTRIN XL) 300 MG 24 hr tablet Take 300 mg by mouth at bedtime.   Yes [provider]  busPIRone (BUSPAR) 10 MG tablet Take 10 mg by mouth 3 (three) times daily.   Yes [provider]  famotidine (PEPCID) 40 MG tablet Take 40 mg by mouth every evening.   Yes [provider]  Fluticasone-Salmeterol (ADVAIR) 500-50 MCG/DOSE AEPB Inhale 1 puff into the lungs daily.    Yes [provider]  pantoprazole (PROTONIX) 40 MG tablet Take 40 mg by mouth daily.   Yes [provider]     Positive ROS: All other systems have been reviewed and were otherwise negative with the exception of those mentioned in the HPI and as above.  Physical Exam: General: Alert, no acute distress Cardiovascular: No pedal edema Respiratory: No cyanosis, no use of accessory musculature GI: abdomen soft Skin: No lesions in the area of chief complaint Neurologic: Sensation intact  distally Psychiatric: Patient is competent for consent with normal mood and affect Lymphatic: no lymphedema  MUSCULOSKELETAL: exam stable  Assessment: LEFT KNEE MEDIAL MENISCAL TEAR  Plan: Plan for Procedure(s): LEFT KNEE ARTHROSCOPY PARTIAL MEDIAL MENISCECTOMY  The risks benefits and alternatives were discussed with the patient including but not limited to the risks of nonoperative treatment, versus surgical intervention including infection, bleeding, nerve injury,  blood clots, cardiopulmonary complications, morbidity, mortality, among others, and they were willing to proceed.   Eduard Roux, MD 05/07/2022 11:54 AM

## 2022-05-07 NOTE — Anesthesia Preprocedure Evaluation (Addendum)
Anesthesia Evaluation  Patient identified by MRN, date of birth, ID band Patient awake    Reviewed: Allergy & Precautions, NPO status , Patient's Chart, lab work & pertinent test results  History of Anesthesia Complications Negative for: history of anesthetic complications  Airway Mallampati: II  TM Distance: >3 FB Neck ROM: Full    Dental  (+) Dental Advisory Given, Chipped, Teeth Intact, Missing, Poor Dentition,    Pulmonary asthma , pneumonia, COPD,  COPD inhaler, Patient abstained from smoking.,    Pulmonary exam normal breath sounds clear to auscultation       Cardiovascular negative cardio ROS Normal cardiovascular exam Rhythm:Regular Rate:Normal     Neuro/Psych PSYCHIATRIC DISORDERS Anxiety Depression Chronic back and leg pain: tramadol    GI/Hepatic Neg liver ROS, GERD  Medicated and Controlled,  Endo/Other  negative endocrine ROS  Renal/GU Renal disease  negative genitourinary   Musculoskeletal  (+) Arthritis , Osteoarthritis,    Abdominal   Peds  Hematology negative hematology ROS (+) plt 285, hct 41   Anesthesia Other Findings   Reproductive/Obstetrics negative OB ROS S/p BTL                            Anesthesia Physical  Anesthesia Plan  ASA: 2  Anesthesia Plan: General   Post-op Pain Management: Minimal or no pain anticipated, Tylenol PO (pre-op)* and Celebrex PO (pre-op)*   Induction:   PONV Risk Score and Plan: 4 or greater and Scopolamine patch - Pre-op, Dexamethasone, Ondansetron, Treatment may vary due to age or medical condition and Midazolam  Airway Management Planned: Oral ETT and LMA  Additional Equipment: None  Intra-op Plan:   Post-operative Plan: Extubation in OR  Informed Consent: I have reviewed the patients History and Physical, chart, labs and discussed the procedure including the risks, benefits and alternatives for the proposed anesthesia  with the patient or authorized representative who has indicated his/her understanding and acceptance.     Dental advisory given  Plan Discussed with: CRNA and Anesthesiologist  Anesthesia Plan Comments:         Anesthesia Quick Evaluation

## 2022-05-08 ENCOUNTER — Encounter (HOSPITAL_BASED_OUTPATIENT_CLINIC_OR_DEPARTMENT_OTHER): Payer: Self-pay | Admitting: Orthopaedic Surgery

## 2022-05-14 ENCOUNTER — Ambulatory Visit (INDEPENDENT_AMBULATORY_CARE_PROVIDER_SITE_OTHER): Payer: Medicaid Other | Admitting: Orthopaedic Surgery

## 2022-05-14 ENCOUNTER — Encounter: Payer: Self-pay | Admitting: Orthopaedic Surgery

## 2022-05-14 DIAGNOSIS — Z9889 Other specified postprocedural states: Secondary | ICD-10-CM

## 2022-05-14 NOTE — Progress Notes (Signed)
Post-Op Visit Note   Patient: [name] [name]           Date of Birth: [birth year]           MRN: [id number] Visit Date: 05/14/2022 PCP: Carylon Perches, NP   Assessment & Plan:  Chief Complaint:  Chief Complaint  Patient presents with   Left Knee - Follow-up    Left knee arthroscopy 05/07/2022   Visit Diagnoses:  1. S/P left knee arthroscopy     Plan: Patient is a pleasant 41 year old female comes in today 1 week status post left knee arthroscopic debridement medial meniscus and chondroplasty, date of surgery 05/07/2022.  It was noted during operative invention she had grade 2 and 3 changes to the patella and grade 4 changes to the trochlea.  She has been doing much better.  She does note increased pain with increased activity.  She is still ambulating with crutches as she did not realize she could walk unassisted.  Examination of her left knee reveals well-healed surgical portals with nylon sutures in place.  No evidence of infection or cellulitis.  Calves are soft nontender.  She is neurovascular intact distally.  Today, sutures were removed and Steri-Strips applied.  Intraoperative pictures reviewed.  Home exercise program provided.  She will follow-up with [place] in 5 weeks for recheck.  Call with concerns or questions.  Follow-Up Instructions: Return in about 5 weeks (around 06/18/2022).   Orders:  No orders of the defined types were placed in this encounter.  No orders of the defined types were placed in this encounter.   Imaging: No new imaging  PMFS History: Patient Active Problem List   Diagnosis Date Noted   Synovitis of knee 05/07/2022   Acute medial meniscus tear of left knee 04/24/2022   Lumbar radiculopathy 01/20/2020   Status post total right knee replacement 08/08/2019   Pelvic pain in female 08/26/2017   Pelvic peritoneal adhesions, female 08/26/2017   Endometriosis of pelvis 08/26/2017   Unilateral primary osteoarthritis, right knee 06/09/2017   Unilateral  primary osteoarthritis, left knee 06/09/2017   Family history of breast cancer in mother 12/06/2015   Menorrhagia 05/31/2013   Dysmenorrhea 05/31/2013   Dyspareunia 05/31/2013   Asthma 08/05/2011   Past Medical History:  Diagnosis Date   Allergy    Arthritis    right knee   Asthma    Constipation    Depression    GERD (gastroesophageal reflux disease)    History of kidney stones    Panic disorder    Pneumonia    Renal disorder    Seasonal allergies    SVD (spontaneous vaginal delivery)    x 3    Family History  Problem Relation Age of Onset   Hypertension Mother    Diabetes Mother    Breast cancer Mother 32       IDC; s/p lumpectomy, radiation, and arimidex   Other Sister        both full sisters also have dense breast tissue   Breast cancer Maternal Grandmother 83       IDC   Colon polyps Maternal Grandmother        approx 4-5 colon polyps   Prostate cancer Paternal Grandfather        dx. 39s-60s   Cancer Cousin 25       paternal 1st cousin dx. w/ either throat or thyroid cancer; not a smoker   Pancreatic cancer Other        maternal great  aunt (MGF's sister) d. pancreatic cancer   Cancer Other        maternal great aunt (MGF's sister) dx NOS cancer   Cancer Other        maternal great grandmother (MGF's mother) d. NOS cancer in her 73s   Breast cancer Other        paternal great aunt (PGF's sister) dx. breast cancer, 100s   Colon cancer Neg Hx    Rectal cancer Neg Hx    Stomach cancer Neg Hx    Esophageal cancer Neg Hx     Past Surgical History:  Procedure Laterality Date   BILATERAL SALPINGECTOMY Bilateral 05/31/2013   Procedure: BILATERAL SALPINGECTOMY;  Surgeon: Cheri Fowler, MD;  Location: [facility];  Service: Gynecology;  Laterality: Bilateral;   BREAST CYST ASPIRATION Right 2015   BREAST CYST EXCISION Right    2015   DILATION AND CURETTAGE OF UTERUS     KIDNEY STONE SURGERY     KNEE ARTHROSCOPY Right    KNEE ARTHROSCOPY WITH DRILLING/MICROFRACTURE  Right 07/26/2014   Procedure: RIGHT KNEE ARTHROSCOPY WITH CHONDROPLASTY, MICROFRACTURE;  Surgeon: Marianna Payment, MD;  Location: [place];  Service: Orthopedics;  Laterality: Right;   KNEE ARTHROSCOPY WITH MEDIAL MENISECTOMY Right 07/26/2014   Procedure: KNEE ARTHROSCOPY WITH MEDIAL MENISECTOMY;  Surgeon: Marianna Payment, MD;  Location: [place];  Service: Orthopedics;  Laterality: Right;   KNEE ARTHROSCOPY WITH MEDIAL MENISECTOMY Left 05/07/2022   Procedure: LEFT KNEE ARTHROSCOPY WITH DEBRIDEMENT AND CHONDROPLASTY PARTIAL MEDIAL MENISCECTOMY;  Surgeon: Leandrew Koyanagi, MD;  Location: [place];  Service: Orthopedics;  Laterality: Left;   LAPAROSCOPIC ASSISTED VAGINAL HYSTERECTOMY N/A 05/31/2013   Procedure: LAPAROSCOPIC ASSISTED VAGINAL HYSTERECTOMY;  Surgeon: Cheri Fowler, MD;  Location: [facility];  Service: Gynecology;  Laterality: N/A;   LAPAROSCOPY N/A 08/26/2017   Procedure: LAPAROSCOPY OPERATIVE WITH PERIOTONEAL BIOPSY AND FULGERATION OF ENDOMETRIOSIS;  Surgeon: Cheri Fowler, MD;  Location: [facility];  Service: Gynecology;  Laterality: N/A;   LUMBAR LAMINECTOMY/DECOMPRESSION MICRODISCECTOMY Right 12/31/2018   Procedure: MICRODISCECTOMY LUMBAR 5- SACRAL1;  Surgeon: Consuella Lose, MD;  Location: [place];  Service: Neurosurgery;  Laterality: Right;  MICRODISCECTOMY LUMBAR 5- SACRAL1   TONSILLECTOMY     TONSILLECTOMY AND ADENOIDECTOMY     TOTAL KNEE ARTHROPLASTY Right 08/08/2019   Procedure: RIGHT TOTAL KNEE ARTHROPLASTY;  Surgeon: Leandrew Koyanagi, MD;  Location: [place];  Service: Orthopedics;  Laterality: Right;   TUBAL LIGATION     UPPER GI ENDOSCOPY     ulcer   Social History   Occupational History   Not on file  Tobacco Use   Smoking status: Never   Smokeless tobacco: Never  Vaping Use   Vaping Use: Never used  Substance and Sexual Activity   Alcohol use: No    Alcohol/week: 0.0 standard drinks of alcohol   Drug use: No    Sexual activity: Not on file

## 2022-06-18 ENCOUNTER — Encounter: Payer: Medicaid Other | Admitting: Orthopaedic Surgery

## 2022-06-25 ENCOUNTER — Ambulatory Visit (INDEPENDENT_AMBULATORY_CARE_PROVIDER_SITE_OTHER): Payer: Medicaid Other | Admitting: Physician Assistant

## 2022-06-25 DIAGNOSIS — Z9889 Other specified postprocedural states: Secondary | ICD-10-CM

## 2022-06-25 MED ORDER — TRAMADOL HCL 50 MG PO TABS: 50.0000 mg | ORAL_TABLET | Freq: Two times a day (BID) | ORAL | 2 refills | Status: DC | PRN

## 2022-06-25 NOTE — Progress Notes (Signed)
Post-Op Visit Note   Patient: [name] [name]           Date of Birth: 01/07/1981           MRN: [id number] Visit Date: 06/25/2022 PCP: Carylon Perches, NP   Assessment & Plan:  Chief Complaint:  Chief Complaint  Patient presents with   Left Knee - Routine Post Op   Visit Diagnoses:  1. S/P left knee arthroscopy     Plan: Patient is a pleasant 41 year old female who comes in today approximately 6 weeks status post left knee arthroscopic debridement medial meniscus, date of surgery 05/07/2022.  It was noted that she had grade 2 and 3 changes patella and grade 4 changes to the trochlea.  She was doing well now with the exception of stairclimbing for which she still has pain.  She also has slight increased pain when she is driving long distances as she is a Geophysicist/field seismologist for [employer].  Examination of her left knee reveals a small effusion.  Range of motion 0 to 120 degrees.  She is neurovascular intact distally.  At this point, I believe she is having pain from the underlying patellofemoral arthritis.  We have discussed that if her symptoms do not continue to improve or worsen over the next few weeks that she may follow up for cortisone injection.  In the meantime, have agreed to send in a prescription of tramadol.  Otherwise follow-up as needed.  Follow-Up Instructions: Return if symptoms worsen or fail to improve.   Orders:  No orders of the defined types were placed in this encounter.  No orders of the defined types were placed in this encounter.   Imaging: No new imaging  PMFS History: Patient Active Problem List   Diagnosis Date Noted   Synovitis of knee 05/07/2022   Acute medial meniscus tear of left knee 04/24/2022   Lumbar radiculopathy 01/20/2020   Status post total right knee replacement 08/08/2019   Pelvic pain in female 08/26/2017   Pelvic peritoneal adhesions, female 08/26/2017   Endometriosis of pelvis 08/26/2017   Unilateral primary osteoarthritis, right knee  06/09/2017   Unilateral primary osteoarthritis, left knee 06/09/2017   Family history of breast cancer in mother 12/06/2015   Menorrhagia 05/31/2013   Dysmenorrhea 05/31/2013   Dyspareunia 05/31/2013   Asthma 08/05/2011   Past Medical History:  Diagnosis Date   Allergy    Arthritis    right knee   Asthma    Constipation    Depression    GERD (gastroesophageal reflux disease)    History of kidney stones    Panic disorder    Pneumonia    Renal disorder    Seasonal allergies    SVD (spontaneous vaginal delivery)    x 3    Family History  Problem Relation Age of Onset   Hypertension Mother    Diabetes Mother    Breast cancer Mother 24       IDC; s/p lumpectomy, radiation, and arimidex   Other Sister        both full sisters also have dense breast tissue   Breast cancer Maternal Grandmother 67       IDC   Colon polyps Maternal Grandmother        approx 4-5 colon polyps   Prostate cancer Paternal Grandfather        dx. 52s-60s   Cancer Cousin 25       paternal 1st cousin dx. w/ either throat or thyroid cancer; not  a smoker   Pancreatic cancer Other        maternal great aunt (MGF's sister) d. pancreatic cancer   Cancer Other        maternal great aunt (MGF's sister) dx NOS cancer   Cancer Other        maternal great grandmother (MGF's mother) d. NOS cancer in her 76s   Breast cancer Other        paternal great aunt (PGF's sister) dx. breast cancer, 32s   Colon cancer Neg Hx    Rectal cancer Neg Hx    Stomach cancer Neg Hx    Esophageal cancer Neg Hx     Past Surgical History:  Procedure Laterality Date   BILATERAL SALPINGECTOMY Bilateral 05/31/2013   Procedure: BILATERAL SALPINGECTOMY;  Surgeon: Cheri Fowler, MD;  Location: [facility];  Service: Gynecology;  Laterality: Bilateral;   BREAST CYST ASPIRATION Right 2015   BREAST CYST EXCISION Right    2015   DILATION AND CURETTAGE OF UTERUS     KIDNEY STONE SURGERY     KNEE ARTHROSCOPY Right    KNEE ARTHROSCOPY  WITH DRILLING/MICROFRACTURE Right 07/26/2014   Procedure: RIGHT KNEE ARTHROSCOPY WITH CHONDROPLASTY, MICROFRACTURE;  Surgeon: Marianna Payment, MD;  Location: [place];  Service: Orthopedics;  Laterality: Right;   KNEE ARTHROSCOPY WITH MEDIAL MENISECTOMY Right 07/26/2014   Procedure: KNEE ARTHROSCOPY WITH MEDIAL MENISECTOMY;  Surgeon: Marianna Payment, MD;  Location: [place];  Service: Orthopedics;  Laterality: Right;   KNEE ARTHROSCOPY WITH MEDIAL MENISECTOMY Left 05/07/2022   Procedure: LEFT KNEE ARTHROSCOPY WITH DEBRIDEMENT AND CHONDROPLASTY PARTIAL MEDIAL MENISCECTOMY;  Surgeon: Leandrew Koyanagi, MD;  Location: [place];  Service: Orthopedics;  Laterality: Left;   LAPAROSCOPIC ASSISTED VAGINAL HYSTERECTOMY N/A 05/31/2013   Procedure: LAPAROSCOPIC ASSISTED VAGINAL HYSTERECTOMY;  Surgeon: Cheri Fowler, MD;  Location: [facility];  Service: Gynecology;  Laterality: N/A;   LAPAROSCOPY N/A 08/26/2017   Procedure: LAPAROSCOPY OPERATIVE WITH PERIOTONEAL BIOPSY AND FULGERATION OF ENDOMETRIOSIS;  Surgeon: Cheri Fowler, MD;  Location: [facility];  Service: Gynecology;  Laterality: N/A;   LUMBAR LAMINECTOMY/DECOMPRESSION MICRODISCECTOMY Right 12/31/2018   Procedure: MICRODISCECTOMY LUMBAR 5- SACRAL1;  Surgeon: Consuella Lose, MD;  Location: [place];  Service: Neurosurgery;  Laterality: Right;  MICRODISCECTOMY LUMBAR 5- SACRAL1   TONSILLECTOMY     TONSILLECTOMY AND ADENOIDECTOMY     TOTAL KNEE ARTHROPLASTY Right 08/08/2019   Procedure: RIGHT TOTAL KNEE ARTHROPLASTY;  Surgeon: Leandrew Koyanagi, MD;  Location: [place];  Service: Orthopedics;  Laterality: Right;   TUBAL LIGATION     UPPER GI ENDOSCOPY     ulcer   Social History   Occupational History   Not on file  Tobacco Use   Smoking status: Never   Smokeless tobacco: Never  Vaping Use   Vaping Use: Never used  Substance and Sexual Activity   Alcohol use: No    Alcohol/week: 0.0 standard drinks of  alcohol   Drug use: No   Sexual activity: Not on file

## 2022-08-15 ENCOUNTER — Encounter: Payer: Self-pay | Admitting: Orthopaedic Surgery

## 2022-08-15 ENCOUNTER — Other Ambulatory Visit: Payer: Self-pay

## 2022-08-15 ENCOUNTER — Ambulatory Visit (INDEPENDENT_AMBULATORY_CARE_PROVIDER_SITE_OTHER): Payer: Medicaid Other | Admitting: Orthopaedic Surgery

## 2022-08-15 DIAGNOSIS — S83242A Other tear of medial meniscus, current injury, left knee, initial encounter: Secondary | ICD-10-CM | POA: Diagnosis not present

## 2022-08-15 MED ORDER — TRAMADOL HCL 50 MG PO TABS: 50.0000 mg | ORAL_TABLET | Freq: Every day | ORAL | 0 refills | Status: DC | PRN

## 2022-08-15 NOTE — Progress Notes (Signed)
Office Visit Note   Patient: [name] [name]           Date of Birth: 27-Feb-1981           MRN: [id number] Visit Date: 08/15/2022              Requested by: Carylon Perches, NP [place],  [place] [id number] PCP: Carylon Perches, NP   Assessment & Plan: Visit Diagnoses:  1. Acute medial meniscus tear of left knee, initial encounter     Plan: Impression is left medial meniscal tear from twisting follow-up.  We will order MRI to evaluate for this.  Follow-up after the MRI.  Tramadol prescribed for breakthrough pain.  Follow-Up Instructions: No follow-ups on file.   Orders:  No orders of the defined types were placed in this encounter.  Meds ordered this encounter  Medications   traMADol (ULTRAM) 50 MG tablet    Sig: Take 1-2 tablets (50-100 mg total) by mouth daily as needed.    Dispense:  20 tablet    Refill:  0      Procedures: No procedures performed   Clinical Data: No additional findings.   Subjective: Chief Complaint  Patient presents with   Left Knee - Follow-up    Left knee arthroscopy 05/07/2022    HPI [name] [name] 42 year old female who is well-known to me comes in for acute injury to the left knee about 2 and half weeks ago.  She had a twisting fall onto her knee when getting out of her car.  She immediately experienced medial sided knee pain reminiscent of the meniscal tear.  She is now having trouble ambulating and experiencing frequent mechanical symptoms.  Review of Systems  Constitutional: Negative.   HENT: Negative.    Eyes: Negative.   Respiratory: Negative.    Cardiovascular: Negative.   Endocrine: Negative.   Musculoskeletal: Negative.   Neurological: Negative.   Hematological: Negative.   Psychiatric/Behavioral: Negative.    All other systems reviewed and are negative.    Objective: Vital Signs: LMP 05/09/2013   Physical Exam Vitals and nursing note reviewed.  Constitutional:      Appearance: She is well-developed.   HENT:     Head: Normocephalic and atraumatic.  Pulmonary:     Effort: Pulmonary effort is normal.  Abdominal:     Palpations: Abdomen is soft.  Musculoskeletal:     Cervical back: Neck supple.  Skin:    General: Skin is warm.     Capillary Refill: Capillary refill takes less than 2 seconds.  Neurological:     Mental Status: She is alert and oriented to person, place, and time.  Psychiatric:        Behavior: Behavior normal.        Thought Content: Thought content normal.        Judgment: Judgment normal.     Ortho Exam Examination left knee shows small joint effusion.  Significant medial joint line tenderness and pain with McMurray's maneuver.  Range of motion is guarded. Specialty Comments:  No specialty comments available.  Imaging: No results found.   PMFS History: Patient Active Problem List   Diagnosis Date Noted   Synovitis of knee 05/07/2022   Acute medial meniscus tear of left knee 04/24/2022   Lumbar radiculopathy 01/20/2020   Status post total right knee replacement 08/08/2019   Pelvic pain in female 08/26/2017   Pelvic peritoneal adhesions, female 08/26/2017   Endometriosis of pelvis 08/26/2017   Unilateral primary osteoarthritis, right knee  06/09/2017   Unilateral primary osteoarthritis, left knee 06/09/2017   Family history of breast cancer in mother 12/06/2015   Menorrhagia 05/31/2013   Dysmenorrhea 05/31/2013   Dyspareunia 05/31/2013   Asthma 08/05/2011   Past Medical History:  Diagnosis Date   Allergy    Arthritis    right knee   Asthma    Constipation    Depression    GERD (gastroesophageal reflux disease)    History of kidney stones    Panic disorder    Pneumonia    Renal disorder    Seasonal allergies    SVD (spontaneous vaginal delivery)    x 3    Family History  Problem Relation Age of Onset   Hypertension Mother    Diabetes Mother    Breast cancer Mother 36       IDC; s/p lumpectomy, radiation, and arimidex   Other Sister         both full sisters also have dense breast tissue   Breast cancer Maternal Grandmother 83       IDC   Colon polyps Maternal Grandmother        approx 4-5 colon polyps   Prostate cancer Paternal Grandfather        dx. 47s-60s   Cancer Cousin 25       paternal 1st cousin dx. w/ either throat or thyroid cancer; not a smoker   Pancreatic cancer Other        maternal great aunt (MGF's sister) d. pancreatic cancer   Cancer Other        maternal great aunt (MGF's sister) dx NOS cancer   Cancer Other        maternal great grandmother (MGF's mother) d. NOS cancer in her 60s   Breast cancer Other        paternal great aunt (PGF's sister) dx. breast cancer, 2s   Colon cancer Neg Hx    Rectal cancer Neg Hx    Stomach cancer Neg Hx    Esophageal cancer Neg Hx     Past Surgical History:  Procedure Laterality Date   BILATERAL SALPINGECTOMY Bilateral 05/31/2013   Procedure: BILATERAL SALPINGECTOMY;  Surgeon: Cheri Fowler, MD;  Location: [facility];  Service: Gynecology;  Laterality: Bilateral;   BREAST CYST ASPIRATION Right 2015   BREAST CYST EXCISION Right    2015   DILATION AND CURETTAGE OF UTERUS     KIDNEY STONE SURGERY     KNEE ARTHROSCOPY Right    KNEE ARTHROSCOPY WITH DRILLING/MICROFRACTURE Right 07/26/2014   Procedure: RIGHT KNEE ARTHROSCOPY WITH CHONDROPLASTY, MICROFRACTURE;  Surgeon: Marianna Payment, MD;  Location: [facility];  Service: Orthopedics;  Laterality: Right;   KNEE ARTHROSCOPY WITH MEDIAL MENISECTOMY Right 07/26/2014   Procedure: KNEE ARTHROSCOPY WITH MEDIAL MENISECTOMY;  Surgeon: Marianna Payment, MD;  Location: [facility];  Service: Orthopedics;  Laterality: Right;   KNEE ARTHROSCOPY WITH MEDIAL MENISECTOMY Left 05/07/2022   Procedure: LEFT KNEE ARTHROSCOPY WITH DEBRIDEMENT AND CHONDROPLASTY PARTIAL MEDIAL MENISCECTOMY;  Surgeon: Leandrew Koyanagi, MD;  Location: [facility];  Service: Orthopedics;  Laterality: Left;    LAPAROSCOPIC ASSISTED VAGINAL HYSTERECTOMY N/A 05/31/2013   Procedure: LAPAROSCOPIC ASSISTED VAGINAL HYSTERECTOMY;  Surgeon: Cheri Fowler, MD;  Location: [facility];  Service: Gynecology;  Laterality: N/A;   LAPAROSCOPY N/A 08/26/2017   Procedure: LAPAROSCOPY OPERATIVE WITH PERIOTONEAL BIOPSY AND FULGERATION OF ENDOMETRIOSIS;  Surgeon: Cheri Fowler, MD;  Location: [facility];  Service: Gynecology;  Laterality: N/A;  LUMBAR LAMINECTOMY/DECOMPRESSION MICRODISCECTOMY Right 12/31/2018   Procedure: MICRODISCECTOMY LUMBAR 5- SACRAL1;  Surgeon: Consuella Lose, MD;  Location: [facility];  Service: Neurosurgery;  Laterality: Right;  MICRODISCECTOMY LUMBAR 5- SACRAL1   TONSILLECTOMY     TONSILLECTOMY AND ADENOIDECTOMY     TOTAL KNEE ARTHROPLASTY Right 08/08/2019   Procedure: RIGHT TOTAL KNEE ARTHROPLASTY;  Surgeon: Leandrew Koyanagi, MD;  Location: [facility];  Service: Orthopedics;  Laterality: Right;   TUBAL LIGATION     UPPER GI ENDOSCOPY     ulcer   Social History   Occupational History   Not on file  Tobacco Use   Smoking status: Never   Smokeless tobacco: Never  Vaping Use   Vaping Use: Never used  Substance and Sexual Activity   Alcohol use: No    Alcohol/week: 0.0 standard drinks of alcohol   Drug use: No   Sexual activity: Not on file

## 2022-08-22 ENCOUNTER — Ambulatory Visit
Admission: RE | Admit: 2022-08-22 | Discharge: 2022-08-22 | Disposition: A | Discharge status: Home / Self Care | Payer: Medicaid Other | Source: Ambulatory Visit | Attending: Orthopaedic Surgery | Admitting: Orthopaedic Surgery

## 2022-08-22 DIAGNOSIS — S83242A Other tear of medial meniscus, current injury, left knee, initial encounter: Secondary | ICD-10-CM

## 2022-08-25 ENCOUNTER — Other Ambulatory Visit: Payer: Medicaid Other

## 2022-08-26 ENCOUNTER — Telehealth: Payer: Self-pay | Admitting: Orthopaedic Surgery

## 2022-08-26 NOTE — Telephone Encounter (Signed)
Patient states she looked over her results and sees that no damage done from repair in November, patient states she is having severe pain and is asking what she should  do prior to appt on the 28th. Please advise.

## 2022-08-26 NOTE — Telephone Encounter (Signed)
I can send in some tramadol if she wants

## 2022-08-27 ENCOUNTER — Ambulatory Visit: Payer: Medicaid Other | Admitting: Orthopaedic Surgery

## 2022-08-28 ENCOUNTER — Ambulatory Visit (INDEPENDENT_AMBULATORY_CARE_PROVIDER_SITE_OTHER): Payer: Medicaid Other | Admitting: Orthopaedic Surgery

## 2022-08-28 DIAGNOSIS — M1712 Unilateral primary osteoarthritis, left knee: Secondary | ICD-10-CM

## 2022-08-28 MED ORDER — CELECOXIB 200 MG PO CAPS: 200.0000 mg | ORAL_CAPSULE | Freq: Two times a day (BID) | ORAL | 3 refills | Status: DC

## 2022-08-28 MED ORDER — METHYLPREDNISOLONE ACETATE 40 MG/ML IJ SUSP
40.0000 mg | INTRAMUSCULAR | Status: AC | PRN
  Administered 2022-08-28: 40 mg via INTRA_ARTICULAR

## 2022-08-28 MED ORDER — LIDOCAINE HCL 1 % IJ SOLN
2.0000 mL | INTRAMUSCULAR | Status: AC | PRN
  Administered 2022-08-28: 2 mL

## 2022-08-28 MED ORDER — BUPIVACAINE HCL 0.5 % IJ SOLN
2.0000 mL | INTRAMUSCULAR | Status: AC | PRN
  Administered 2022-08-28: 2 mL via INTRA_ARTICULAR

## 2022-08-28 NOTE — Progress Notes (Signed)
Office Visit Note   Patient: [name] [name]           Date of Birth: Oct 05, 1980           MRN: [id number] Visit Date: 08/28/2022              Requested by: Carylon Perches, NP [place],  [place] [id number] PCP: Carylon Perches, NP   Assessment & Plan: Visit Diagnoses:  1. Primary osteoarthritis of left knee     Plan: Impression is chronic left knee pain from underlying osteoarthritis.  Today, we discussed various treatment options to include cortisone injection versus NSAIDs versus PT.  She would like to try cortisone injection today as well as a course of physical therapy.  Referral has been made.  She is asked about replacing her knee but we do not feel her arthritis is to the degree that this is needed to be replaced.  I would recommend taking an NSAID on a regular basis to see what kind of effect that has.  Follow-up as needed.  Follow-Up Instructions: Return if symptoms worsen or fail to improve.   Orders:  Orders Placed This Encounter  Procedures   Large Joint Inj   Ambulatory referral to Physical Therapy   Meds ordered this encounter  Medications   celecoxib (CELEBREX) 200 MG capsule    Sig: Take 1 capsule (200 mg total) by mouth 2 (two) times daily.    Dispense:  30 capsule    Refill:  3      Procedures: Large Joint Inj: L knee on 08/28/2022 3:57 PM Details: 22 G needle Medications: 2 mL bupivacaine 0.5 %; 2 mL lidocaine 1 %; 40 mg methylPREDNISolone acetate 40 MG/ML Outcome: tolerated well, no immediate complications Patient was prepped and draped in the usual sterile fashion.       Clinical Data: No additional findings.   Subjective: Chief Complaint  Patient presents with   Left Knee - Pain    HPI patient is a pleasant 42 year old female who comes in today to discuss MRI results of the left knee.  History of underlying osteoarthritis to the medial and patellofemoral compartments.  Underwent left knee arthroscopic debridement medial  meniscus and chondroplasty 05/07/2022.  She was doing okay until she twisted her knee earlier this month.  She has had pain to the entire knee specifically the medial compartment sent.  Recent MRI of the left knee shows no new meniscal pathology.  She does have tricompartmental osteoarthritis most prominent in the medial and patellofemoral compartments.  Of note, she is status post right total knee replacement from a few years ago and is doing great there.  Review of Systems as detailed in HPI.  All others reviewed and are negative.   Objective: Vital Signs: LMP 05/09/2013   Physical Exam well-developed well-nourished female no acute distress.  Alert and oriented x 3.  Ortho Exam left knee exam shows a trace effusion.  Range of motion 0 to 100 degrees.  Medial joint line tenderness mild lateral joint line tenderness.  She is neurovascular intact distally.  Specialty Comments:  No specialty comments available.  Imaging: No new imaging   PMFS History: Patient Active Problem List   Diagnosis Date Noted   Synovitis of knee 05/07/2022   Acute medial meniscus tear of left knee 04/24/2022   Lumbar radiculopathy 01/20/2020   Status post total right knee replacement 08/08/2019   Pelvic pain in female 08/26/2017   Pelvic peritoneal adhesions, female 08/26/2017  Endometriosis of pelvis 08/26/2017   Unilateral primary osteoarthritis, right knee 06/09/2017   Unilateral primary osteoarthritis, left knee 06/09/2017   Family history of breast cancer in mother 12/06/2015   Menorrhagia 05/31/2013   Dysmenorrhea 05/31/2013   Dyspareunia 05/31/2013   Asthma 08/05/2011   Past Medical History:  Diagnosis Date   Allergy    Arthritis    right knee   Asthma    Constipation    Depression    GERD (gastroesophageal reflux disease)    History of kidney stones    Panic disorder    Pneumonia    Renal disorder    Seasonal allergies    SVD (spontaneous vaginal delivery)    x 3    Family History   Problem Relation Age of Onset   Hypertension Mother    Diabetes Mother    Breast cancer Mother 72       IDC; s/p lumpectomy, radiation, and arimidex   Other Sister        both full sisters also have dense breast tissue   Breast cancer Maternal Grandmother 83       IDC   Colon polyps Maternal Grandmother        approx 4-5 colon polyps   Prostate cancer Paternal Grandfather        dx. 3s-60s   Cancer Cousin 25       paternal 1st cousin dx. w/ either throat or thyroid cancer; not a smoker   Pancreatic cancer Other        maternal great aunt (MGF's sister) d. pancreatic cancer   Cancer Other        maternal great aunt (MGF's sister) dx NOS cancer   Cancer Other        maternal great grandmother (MGF's mother) d. NOS cancer in her 16s   Breast cancer Other        paternal great aunt (PGF's sister) dx. breast cancer, 66s   Colon cancer Neg Hx    Rectal cancer Neg Hx    Stomach cancer Neg Hx    Esophageal cancer Neg Hx     Past Surgical History:  Procedure Laterality Date   BILATERAL SALPINGECTOMY Bilateral 05/31/2013   Procedure: BILATERAL SALPINGECTOMY;  Surgeon: Cheri Fowler, MD;  Location: [facility];  Service: Gynecology;  Laterality: Bilateral;   BREAST CYST ASPIRATION Right 2015   BREAST CYST EXCISION Right    2015   DILATION AND CURETTAGE OF UTERUS     KIDNEY STONE SURGERY     KNEE ARTHROSCOPY Right    KNEE ARTHROSCOPY WITH DRILLING/MICROFRACTURE Right 07/26/2014   Procedure: RIGHT KNEE ARTHROSCOPY WITH CHONDROPLASTY, MICROFRACTURE;  Surgeon: Marianna Payment, MD;  Location: [facility];  Service: Orthopedics;  Laterality: Right;   KNEE ARTHROSCOPY WITH MEDIAL MENISECTOMY Right 07/26/2014   Procedure: KNEE ARTHROSCOPY WITH MEDIAL MENISECTOMY;  Surgeon: Marianna Payment, MD;  Location: [facility];  Service: Orthopedics;  Laterality: Right;   KNEE ARTHROSCOPY WITH MEDIAL MENISECTOMY Left 05/07/2022   Procedure: LEFT KNEE ARTHROSCOPY WITH  DEBRIDEMENT AND CHONDROPLASTY PARTIAL MEDIAL MENISCECTOMY;  Surgeon: Leandrew Koyanagi, MD;  Location: [facility];  Service: Orthopedics;  Laterality: Left;   LAPAROSCOPIC ASSISTED VAGINAL HYSTERECTOMY N/A 05/31/2013   Procedure: LAPAROSCOPIC ASSISTED VAGINAL HYSTERECTOMY;  Surgeon: Cheri Fowler, MD;  Location: [facility];  Service: Gynecology;  Laterality: N/A;   LAPAROSCOPY N/A 08/26/2017   Procedure: LAPAROSCOPY OPERATIVE WITH PERIOTONEAL BIOPSY AND FULGERATION OF ENDOMETRIOSIS;  Surgeon: Cheri Fowler, MD;  Location: [facility];  Service: Gynecology;  Laterality: N/A;   LUMBAR LAMINECTOMY/DECOMPRESSION MICRODISCECTOMY Right 12/31/2018   Procedure: MICRODISCECTOMY LUMBAR 5- SACRAL1;  Surgeon: Consuella Lose, MD;  Location: [facility];  Service: Neurosurgery;  Laterality: Right;  MICRODISCECTOMY LUMBAR 5- SACRAL1   TONSILLECTOMY     TONSILLECTOMY AND ADENOIDECTOMY     TOTAL KNEE ARTHROPLASTY Right 08/08/2019   Procedure: RIGHT TOTAL KNEE ARTHROPLASTY;  Surgeon: Leandrew Koyanagi, MD;  Location: [facility];  Service: Orthopedics;  Laterality: Right;   TUBAL LIGATION     UPPER GI ENDOSCOPY     ulcer   Social History   Occupational History   Not on file  Tobacco Use   Smoking status: Never   Smokeless tobacco: Never  Vaping Use   Vaping Use: Never used  Substance and Sexual Activity   Alcohol use: No    Alcohol/week: 0.0 standard drinks of alcohol   Drug use: No   Sexual activity: Not on file

## 2022-09-03 ENCOUNTER — Ambulatory Visit: Payer: Medicaid Other | Admitting: Orthopaedic Surgery

## 2022-10-10 ENCOUNTER — Other Ambulatory Visit: Payer: Self-pay | Admitting: Obstetrics and Gynecology

## 2022-10-10 DIAGNOSIS — Z1231 Encounter for screening mammogram for malignant neoplasm of breast: Secondary | ICD-10-CM

## 2023-06-17 ENCOUNTER — Other Ambulatory Visit: Payer: Self-pay

## 2023-06-17 ENCOUNTER — Encounter (HOSPITAL_BASED_OUTPATIENT_CLINIC_OR_DEPARTMENT_OTHER): Payer: Self-pay

## 2023-06-17 ENCOUNTER — Other Ambulatory Visit (HOSPITAL_BASED_OUTPATIENT_CLINIC_OR_DEPARTMENT_OTHER): Payer: Self-pay

## 2023-06-17 ENCOUNTER — Emergency Department (HOSPITAL_BASED_OUTPATIENT_CLINIC_OR_DEPARTMENT_OTHER)
Admission: EM | Admit: 2023-06-17 | Discharge: 2023-06-17 | Disposition: A | Discharge status: Home / Self Care | Payer: Medicaid Other | Attending: Emergency Medicine | Admitting: Emergency Medicine

## 2023-06-17 DIAGNOSIS — R1010 Upper abdominal pain, unspecified: Secondary | ICD-10-CM

## 2023-06-17 DIAGNOSIS — R1012 Left upper quadrant pain: Secondary | ICD-10-CM | POA: Insufficient documentation

## 2023-06-17 LAB — CBC WITH DIFFERENTIAL/PLATELET
Abs Immature Granulocytes: 0.01 10*3/uL (ref 0.00–0.07)
Basophils Absolute: 0 10*3/uL (ref 0.0–0.1)
Basophils Relative: 0 %
Eosinophils Absolute: 0.1 10*3/uL (ref 0.0–0.5)
Eosinophils Relative: 1 %
HCT: 41.8 % (ref 36.0–46.0)
Hemoglobin: 14.2 g/dL (ref 12.0–15.0)
Immature Granulocytes: 0 %
Lymphocytes Relative: 13 %
Lymphs Abs: 0.6 10*3/uL — ABNORMAL LOW (ref 0.7–4.0)
MCH: 31.9 pg (ref 26.0–34.0)
MCHC: 34 g/dL (ref 30.0–36.0)
MCV: 93.9 fL (ref 80.0–100.0)
Monocytes Absolute: 0.4 10*3/uL (ref 0.1–1.0)
Monocytes Relative: 9 %
Neutro Abs: 3.6 10*3/uL (ref 1.7–7.7)
Neutrophils Relative %: 77 %
Platelets: 172 10*3/uL (ref 150–400)
RBC: 4.45 MIL/uL (ref 3.87–5.11)
RDW: 11.9 % (ref 11.5–15.5)
WBC: 4.7 10*3/uL (ref 4.0–10.5)
nRBC: 0 % (ref 0.0–0.2)

## 2023-06-17 LAB — COMPREHENSIVE METABOLIC PANEL
ALT: 22 U/L (ref 0–44)
AST: 17 U/L (ref 15–41)
Albumin: 4.3 g/dL (ref 3.5–5.0)
Alkaline Phosphatase: 60 U/L (ref 38–126)
Anion gap: 8 (ref 5–15)
BUN: 10 mg/dL (ref 6–20)
CO2: 26 mmol/L (ref 22–32)
Calcium: 9.1 mg/dL (ref 8.9–10.3)
Chloride: 104 mmol/L (ref 98–111)
Creatinine, Ser: 0.9 mg/dL (ref 0.44–1.00)
GFR, Estimated: >60 mL/min (ref >60)
Glucose, Bld: 108 mg/dL — ABNORMAL HIGH (ref 70–99)
Potassium: 3.2 mmol/L — ABNORMAL LOW (ref 3.5–5.1)
Sodium: 138 mmol/L (ref 135–145)
Total Bilirubin: 0.6 mg/dL (ref <1.2)
Total Protein: 6.7 g/dL (ref 6.5–8.1)

## 2023-06-17 LAB — URINALYSIS, ROUTINE W REFLEX MICROSCOPIC
Bilirubin Urine: NEGATIVE
Glucose, UA: NEGATIVE mg/dL
Hgb urine dipstick: NEGATIVE
Ketones, ur: NEGATIVE mg/dL
Leukocytes,Ua: NEGATIVE
Nitrite: NEGATIVE
Specific Gravity, Urine: 1.024 (ref 1.005–1.030)
pH: 5.5 (ref 5.0–8.0)

## 2023-06-17 LAB — LIPASE, BLOOD: Lipase: 19 U/L (ref 11–51)

## 2023-06-17 LAB — HCG, SERUM, QUALITATIVE: Preg, Serum: NEGATIVE

## 2023-06-17 MED ORDER — SUCRALFATE 1 G PO TABS
1.0000 g | ORAL_TABLET | Freq: Three times a day (TID) | ORAL | 1 refills | Status: DC
  Filled 2023-06-17: qty 60, 15d supply, fill #0

## 2023-06-17 MED ORDER — HYDROMORPHONE HCL 1 MG/ML IJ SOLN
0.5000 mg | Freq: Once | INTRAMUSCULAR | Status: AC
  Administered 2023-06-17: 0.5 mg via INTRAVENOUS
  Filled 2023-06-17: qty 1

## 2023-06-17 MED ORDER — SODIUM CHLORIDE 0.9 % IV BOLUS
1000.0000 mL | Freq: Once | INTRAVENOUS | Status: AC
  Administered 2023-06-17: 1000 mL via INTRAVENOUS

## 2023-06-17 MED ORDER — ONDANSETRON HCL 4 MG/2ML IJ SOLN
4.0000 mg | Freq: Once | INTRAMUSCULAR | Status: AC
  Administered 2023-06-17: 4 mg via INTRAVENOUS
  Filled 2023-06-17: qty 2

## 2023-06-17 MED ORDER — OXYCODONE HCL 5 MG PO TABS
5.0000 mg | ORAL_TABLET | Freq: Four times a day (QID) | ORAL | 0 refills | Status: DC | PRN
  Filled 2023-06-17: qty 5, 2d supply, fill #0

## 2023-06-17 NOTE — ED Triage Notes (Signed)
In for eval of bilateral upper abd pain onset yesterday with nausea and vomiting. This morning started having diarrhea but feels like she is bloated. Denies dysuria.

## 2023-06-17 NOTE — Discharge Instructions (Signed)
Please read and follow all provided instructions.  Your diagnoses today include:  1. Upper abdominal pain    Tests performed today include: Blood cell counts and platelets Kidney and liver function tests: slightly low potassium Pancreas function test (called lipase) Urine test to look for infection A blood or urine test for pregnancy (women only) Vital signs. See below for your results today.   Medications prescribed:  Carafate - for stomach upset and to protect your stomach  Oxycodone - narcotic pain medication  DO NOT drive or perform any activities that require you to be awake and alert because this medicine can make you drowsy.   Take any prescribed medications only as directed.  Home care instructions:  Follow any educational materials contained in this packet. Follow a bland diet over the next several days Avoid over-the-counter anti-inflammatory drugs such as ibuprofen or naproxen, avoid alcohol  Follow-up instructions: Please follow-up with your primary care provider in the next 3 days for further evaluation of your symptoms.    Return instructions:  SEEK IMMEDIATE MEDICAL ATTENTION IF: The pain does not go away or becomes severe  A temperature above 101F develops  Repeated vomiting occurs (multiple episodes)  The pain becomes localized to portions of the abdomen. The right side could possibly be appendicitis. In an adult, the left lower portion of the abdomen could be colitis or diverticulitis.  Blood is being passed in stools or vomit (bright red or black tarry stools)  You develop chest pain, difficulty breathing, dizziness or fainting, or become confused, poorly responsive, or inconsolable (young children) If you have any other emergent concerns regarding your health  Additional Information: Abdominal (belly) pain can be caused by many things. Your caregiver performed an examination and possibly ordered blood/urine tests and imaging (CT scan, x-rays, ultrasound).  Many cases can be observed and treated at home after initial evaluation in the emergency department. Even though you are being discharged home, abdominal pain can be unpredictable. Therefore, you need a repeated exam if your pain does not resolve, returns, or worsens. Most patients with abdominal pain don't have to be admitted to the hospital or have surgery, but serious problems like appendicitis and gallbladder attacks can start out as nonspecific pain. Many abdominal conditions cannot be diagnosed in one visit, so follow-up evaluations are very important.  Your vital signs today were: BP (!) 137/95 (BP Location: Right Arm)   Pulse 94   Temp 98.5 F (36.9 C)   Resp 18   Ht 5\' 2"  (1.575 m)   Wt 47.6 kg   LMP 05/09/2013   SpO2 100%   BMI 19.20 kg/m  If your blood pressure (bp) was elevated above 135/85 this visit, please have this repeated by your doctor within one month. --------------

## 2023-06-17 NOTE — ED Provider Notes (Signed)
[place] EMERGENCY DEPARTMENT AT [facility] Provider Note   CSN: [id number] Arrival date & time: 06/17/23  2952     History  Chief Complaint  Patient presents with   Abdominal Pain    [name] [name] is a 42 y.o. female.  Patient presents to the emergency department for evaluation of upper abdominal pain with radiation to the upper back starting yesterday.  It is gradually progressed.  Back pain started last night and is worse this morning.  She had difficult time sleeping due to the pain.  Last night she had a vomiting "spell" and vomited some more this morning.  Feels that trying to drink or eat anything makes the pain worse.  Movement also makes it worse.  She has tried some ginger ale and toast this morning.  Her only previous abdominal surgery was laparoscopic surgery for endometriosis.  She denies recent heavy NSAID use, smoking, alcohol use.  She did have some associated diarrhea that was not bloody, multiple episodes, watery in nature.  She does take famotidine and pantoprazole on a regular basis for GERD.        Home Medications Prior to Admission medications   Medication Sig Start Date End Date Taking? Authorizing Provider  albuterol (VENTOLIN HFA) 108 (90 Base) MCG/ACT inhaler Inhale 2 puffs into the lungs every 6 (six) hours as needed for wheezing or shortness of breath. Patient taking differently: Inhale 2 puffs into the lungs 2 (two) times daily. Inhale 2 puffs twice daily after using advair doses, may inhale 2 puffs every 6 hours as needed for shortness of breath 02/02/19  Yes Daphine Deutscher, Mary-Margaret, FNP  buPROPion (WELLBUTRIN XL) 300 MG 24 hr tablet Take 300 mg by mouth at bedtime.   Yes [provider]  busPIRone (BUSPAR) 10 MG tablet Take 10 mg by mouth 3 (three) times daily.   Yes [provider]  famotidine (PEPCID) 40 MG tablet Take 40 mg by mouth every evening.   Yes [provider]  Fluticasone-Salmeterol (ADVAIR)  500-50 MCG/DOSE AEPB Inhale 1 puff into the lungs daily.    Yes [provider]  guanFACINE (INTUNIV) 1 MG TB24 ER tablet Take 1 mg by mouth daily. 05/26/23  Yes [provider]  pantoprazole (PROTONIX) 40 MG tablet Take 40 mg by mouth daily.   Yes [provider]  VRAYLAR 1.5 MG capsule Take 1.5 mg by mouth daily. 05/26/23  Yes [provider]      Allergies    Duloxetine hcl, Nitrofurantoin monohyd macro, Aspirin, and Morphine and codeine    Review of Systems   Review of Systems  Physical Exam Updated Vital Signs BP (!) 137/95 (BP Location: Right Arm)   Pulse 94   Temp 98.5 F (36.9 C)   Resp 18   Ht 5\' 2"  (1.575 m)   Wt 47.6 kg   LMP 05/09/2013   SpO2 100%   BMI 19.20 kg/m  Physical Exam Vitals and nursing note reviewed.  Constitutional:      General: She is not in acute distress.    Appearance: She is well-developed.  HENT:     Head: Normocephalic and atraumatic.     Right Ear: External ear normal.     Left Ear: External ear normal.     Nose: Nose normal.  Eyes:     Conjunctiva/sclera: Conjunctivae normal.  Cardiovascular:     Rate and Rhythm: Normal rate and regular rhythm.     Pulses:  Radial pulses are 2+ on the right side and 2+ on the left side.     Heart sounds: No murmur heard. Pulmonary:     Effort: No respiratory distress.     Breath sounds: No wheezing, rhonchi or rales.  Abdominal:     Palpations: Abdomen is soft.     Tenderness: There is abdominal tenderness in the epigastric area and left upper quadrant. There is no guarding or rebound.     Comments: No pulsatile mass in midline.  Musculoskeletal:     Cervical back: Normal range of motion and neck supple.     Right lower leg: No edema.     Left lower leg: No edema.  Skin:    General: Skin is warm and dry.     Findings: No rash.  Neurological:     General: No focal deficit present.     Mental Status: She is alert. Mental status is at baseline.      Motor: No weakness.  Psychiatric:        Mood and Affect: Mood normal.     ED Results / Procedures / Treatments   Labs (all labs ordered are listed, but only abnormal results are displayed) Labs Reviewed  CBC WITH DIFFERENTIAL/PLATELET - Abnormal; Notable for the following components:      Result Value   Lymphs Abs 0.6 (*)    All other components within normal limits  COMPREHENSIVE METABOLIC PANEL - Abnormal; Notable for the following components:   Potassium 3.2 (*)    Glucose, Bld 108 (*)    All other components within normal limits  URINALYSIS, ROUTINE W REFLEX MICROSCOPIC - Abnormal; Notable for the following components:   Protein, ur TRACE (*)    All other components within normal limits  LIPASE, BLOOD  HCG, SERUM, QUALITATIVE    EKG None  Radiology No results found.  Procedures Procedures    Medications Ordered in ED Medications - No data to display  ED Course/ Medical Decision Making/ A&P    Patient seen and examined. History obtained directly from patient.   Labs/EKG: Ordered CBC, CMP, lipase, UA.  Imaging: None ordered, awaiting results of initial labs  Medications/Fluids: Ordered: Fluid bolus, IV Zofran, IV Dilaudid.   Most recent vital signs reviewed and are as follows: BP (!) 137/95 (BP Location: Right Arm)   Pulse 94   Temp 98.5 F (36.9 C)   Resp 18   Ht 5\' 2"  (1.575 m)   Wt 47.6 kg   LMP 05/09/2013   SpO2 100%   BMI 19.20 kg/m   Initial impression: Upper abdominal pain, mainly epigastrium with radiation to the back.  12:34 PM Reassessment performed. Patient appears improved.  She states that her pain is better and overall she feels more comfortable.  Has some residual mild back pain.  Labs personally reviewed and interpreted including: CBC with differential unremarkable; CMP with mildly low potassium otherwise unremarkable; lipase normal; pregnancy negative; UA without signs of infection.  Reviewed pertinent lab work and imaging with  patient at bedside. Questions answered.  We discussed how to proceed including additional testing with right upper quadrant ultrasound to look for possibility of gallstones, CT scan of the abdomen to look for other causes.  Also discussed possibility of treatment of symptoms and continued monitoring, acknowledging the need for return if symptoms worsen.  After bedside discussion, patient states that she would prefer to go home.  She states willingness to come back if symptoms do worsen.  Most current vital  signs reviewed and are as follows: BP (!) 137/95 (BP Location: Right Arm)   Pulse 94   Temp 98.5 F (36.9 C)   Resp 18   Ht 5\' 2"  (1.575 m)   Wt 47.6 kg   LMP 05/09/2013   SpO2 100%   BMI 19.20 kg/m   Plan: Discharge to home.   Prescriptions written for: Carafate, oxycodone # 5 tablets for more severe pain  Other home care instructions discussed: Parke Simmers diet, slow advancement, continued use of PPI and H2 blockers.  ED return instructions discussed: The patient was urged to return to the Emergency Department immediately with worsening of current symptoms, worsening abdominal pain, persistent vomiting, blood noted in stools, fever, or any other concerns. The patient verbalized understanding.   Follow-up instructions discussed: Patient encouraged to follow-up with their PCP in 3 days.                                 Medical Decision Making Amount and/or Complexity of Data Reviewed Labs: ordered.  Risk Prescription drug management.   For this patient's complaint of abdominal pain, the following conditions were considered on the differential diagnosis: gastritis/PUD, enteritis/duodenitis, appendicitis, cholelithiasis/cholecystitis, cholangitis, pancreatitis, ruptured viscus, colitis, diverticulitis, small/large bowel obstruction, proctitis, cystitis, pyelonephritis, ureteral colic, aortic dissection, aortic aneurysm. In women, ectopic pregnancy, pelvic inflammatory disease, ovarian  cysts, and tubo-ovarian abscess were also considered. Atypical chest etiologies were also considered including ACS, PE, and pneumonia.   Of note, calcium score of 0 in October 2023.  Symptoms would be very atypical for ACS.  Low concern for dissection.  The patient's vital signs, pertinent lab work and imaging were reviewed and interpreted as discussed in the ED course. Hospitalization was considered for further testing, treatments, or serial exams/observation. However as patient is well-appearing, has a stable exam, and reassuring studies today, I do not feel that they warrant admission at this time. This plan was discussed with the patient who verbalizes agreement and comfort with this plan and seems reliable and able to return to the Emergency Department with worsening or changing symptoms.         Final Clinical Impression(s) / ED Diagnoses Final diagnoses:  Upper abdominal pain    Rx / DC Orders ED Discharge Orders          Ordered    sucralfate (CARAFATE) 1 g tablet  3 times daily with meals & bedtime        06/17/23 1227    oxyCODONE (OXY IR/ROXICODONE) 5 MG immediate release tablet  Every 6 hours PRN        06/17/23 1227              Renne Crigler, PA-C 06/17/23 1237    Royanne Foots, DO 06/21/23 2029

## 2023-07-16 ENCOUNTER — Encounter: Payer: Self-pay | Admitting: Orthopaedic Surgery

## 2023-07-16 ENCOUNTER — Ambulatory Visit: Payer: Medicaid Other | Admitting: Orthopaedic Surgery

## 2023-07-16 ENCOUNTER — Other Ambulatory Visit (INDEPENDENT_AMBULATORY_CARE_PROVIDER_SITE_OTHER): Payer: Self-pay

## 2023-07-16 DIAGNOSIS — M25551 Pain in right hip: Secondary | ICD-10-CM

## 2023-07-16 NOTE — Progress Notes (Addendum)
 Office Visit Note   Patient: [name] [name]           Date of Birth: 01-16-1981           MRN: [id number] Visit Date: 07/16/2023              Requested by: Austin Mutton, MD [address] [place],  [place] [address] PCP: Austin Mutton, MD   Assessment & Plan: Visit Diagnoses:  1. Pain in right hip     Plan: Dymon is a 43 year old female with right hip pain.  Impression is right gluteus maximus insertional strain from the posterior iliac wing.  Not demonstrating any intra-articular problems or radicular symptoms.  X-rays are unremarkable.  Recommend symptomatic treatment.  Follow-Up Instructions: No follow-ups on file.   Orders:  Orders Placed This Encounter  Procedures   XR HIP UNILAT W OR W/O PELVIS 2-3 VIEWS RIGHT   No orders of the defined types were placed in this encounter.     Procedures: No procedures performed   Clinical Data: No additional findings.   Subjective: Chief Complaint  Patient presents with   Right Hip - Pain    HPI Kalisi comes in today for evaluation of right hip pain.  She was playing soccer and felt something in her lower back area when she went to kick the ball.  Felt like something ripped.  Denies any groin pain.  Has been using ibuprofen heat and ice which do help with the symptoms. Review of Systems  Constitutional: Negative.   HENT: Negative.    Eyes: Negative.   Respiratory: Negative.    Cardiovascular: Negative.   Endocrine: Negative.   Musculoskeletal: Negative.   Neurological: Negative.   Hematological: Negative.   Psychiatric/Behavioral: Negative.    All other systems reviewed and are negative.    Objective: Vital Signs: LMP 05/09/2013   Physical Exam Vitals and nursing note reviewed.  Constitutional:      Appearance: She is well-developed.  HENT:     Head: Normocephalic and atraumatic.  Pulmonary:     Effort: Pulmonary effort is normal.  Abdominal:     Palpations: Abdomen is soft.  Musculoskeletal:      Cervical back: Neck supple.  Skin:    General: Skin is warm.     Capillary Refill: Capillary refill takes less than 2 seconds.  Neurological:     Mental Status: She is alert and oriented to person, place, and time.  Psychiatric:        Behavior: Behavior normal.        Thought Content: Thought content normal.        Judgment: Judgment normal.     Ortho Exam Exam of the right hip and lumbar spine shows tenderness along the posterior iliac crest.  Fluid painless range of motion of the hip.  No sciatic tension signs. Specialty Comments:  No specialty comments available.  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   Pelvic pain in female 08/26/2017   Pelvic peritoneal adhesions, female 08/26/2017   Endometriosis of pelvis 08/26/2017   Unilateral primary osteoarthritis, right knee 06/09/2017   Unilateral primary osteoarthritis, left knee  06/09/2017   Family history of breast cancer in mother 12/06/2015   Menorrhagia 05/31/2013   Dysmenorrhea 05/31/2013   Dyspareunia 05/31/2013   Asthma 08/05/2011   Past Medical History:  Diagnosis Date   Allergy    Arthritis    right knee   Asthma    Constipation    Depression    GERD (gastroesophageal reflux disease)    History of kidney stones    Panic disorder    Pneumonia    Renal disorder    Seasonal allergies    SVD (spontaneous vaginal delivery)    x 3    Family History  Problem Relation Age of Onset   Hypertension Mother    Diabetes Mother    Breast cancer Mother 22       IDC; s/p lumpectomy, radiation, and arimidex   Other Sister        both full sisters also have dense breast  tissue   Breast cancer Maternal Grandmother 102       IDC   Colon polyps Maternal Grandmother        approx 4-5 colon polyps   Prostate cancer Paternal Grandfather        dx. 84s-60s   Cancer Cousin 25       paternal 1st cousin dx. w/ either throat or thyroid cancer; not a smoker   Pancreatic cancer Other        maternal great aunt (MGF's sister) d. pancreatic cancer   Cancer Other        maternal great aunt (MGF's sister) dx NOS cancer   Cancer Other        maternal great grandmother (MGF's mother) d. NOS cancer in her 8s   Breast cancer Other        paternal great aunt (PGF's sister) dx. breast cancer, 55s   Colon cancer Neg Hx    Rectal cancer Neg Hx    Stomach cancer Neg Hx    Esophageal cancer Neg Hx     Past Surgical History:  Procedure Laterality Date   BILATERAL SALPINGECTOMY Bilateral 05/31/2013   Procedure: BILATERAL SALPINGECTOMY;  Surgeon: Krystal Deaner, MD;  Location: [facility];  Service: Gynecology;  Laterality: Bilateral;   BREAST CYST ASPIRATION Right 2015   BREAST CYST EXCISION Right    2015   DILATION AND CURETTAGE OF UTERUS     KIDNEY STONE SURGERY     KNEE ARTHROSCOPY Right    KNEE ARTHROSCOPY WITH DRILLING/MICROFRACTURE Right 07/26/2014   Procedure: RIGHT KNEE ARTHROSCOPY WITH CHONDROPLASTY, MICROFRACTURE;  Surgeon: Kay Ozell Cummins, MD;  Location: [facility];  Service: Orthopedics;  Laterality: Right;   KNEE ARTHROSCOPY WITH MEDIAL MENISECTOMY Right 07/26/2014   Procedure: KNEE ARTHROSCOPY WITH MEDIAL MENISECTOMY;  Surgeon: Kay Ozell Cummins, MD;  Location: [facility];  Service: Orthopedics;  Laterality: Right;   KNEE ARTHROSCOPY WITH MEDIAL MENISECTOMY Left 05/07/2022   Procedure: LEFT KNEE ARTHROSCOPY WITH DEBRIDEMENT AND CHONDROPLASTY PARTIAL MEDIAL MENISCECTOMY;  Surgeon: Cummins Kay HERO, MD;  Location: [facility];  Service: Orthopedics;  Laterality: Left;   LAPAROSCOPIC ASSISTED VAGINAL HYSTERECTOMY N/A  05/31/2013   Procedure: LAPAROSCOPIC ASSISTED VAGINAL HYSTERECTOMY;  Surgeon: Krystal Deaner, MD;  Location: [facility];  Service: Gynecology;  Laterality: N/A;   LAPAROSCOPY N/A 08/26/2017   Procedure: LAPAROSCOPY OPERATIVE WITH PERIOTONEAL BIOPSY AND FULGERATION OF ENDOMETRIOSIS;  Surgeon: Deaner Krystal, MD;  Location: [facility];  Service: Gynecology;  Laterality: N/A;   LUMBAR LAMINECTOMY/DECOMPRESSION MICRODISCECTOMY Right 12/31/2018   Procedure:  MICRODISCECTOMY LUMBAR 5- SACRAL1;  Surgeon: Lanis Pupa, MD;  Location: [facility];  Service: Neurosurgery;  Laterality: Right;  MICRODISCECTOMY LUMBAR 5- SACRAL1   TONSILLECTOMY     TONSILLECTOMY AND ADENOIDECTOMY     TOTAL KNEE ARTHROPLASTY Right 08/08/2019   Procedure: RIGHT TOTAL KNEE ARTHROPLASTY;  Surgeon: Jerri Kay HERO, MD;  Location: [facility];  Service: Orthopedics;  Laterality: Right;   TUBAL LIGATION     UPPER GI ENDOSCOPY     ulcer   Social History   Occupational History   Not on file  Tobacco Use   Smoking status: Never   Smokeless tobacco: Never  Vaping Use   Vaping status: Never Used  Substance and Sexual Activity   Alcohol use: No    Alcohol/week: 0.0 standard drinks of alcohol   Drug use: No   Sexual activity: Not on file

## 2023-12-23 ENCOUNTER — Emergency Department (HOSPITAL_BASED_OUTPATIENT_CLINIC_OR_DEPARTMENT_OTHER)
Admission: EM | Admit: 2023-12-23 | Discharge: 2023-12-23 | Disposition: A | Discharge status: Home / Self Care | Payer: MEDICAID

## 2023-12-23 ENCOUNTER — Other Ambulatory Visit: Payer: Self-pay

## 2023-12-23 DIAGNOSIS — M5481 Occipital neuralgia: Secondary | ICD-10-CM | POA: Insufficient documentation

## 2023-12-23 DIAGNOSIS — R519 Headache, unspecified: Secondary | ICD-10-CM | POA: Diagnosis present

## 2023-12-23 MED ORDER — DEXAMETHASONE SODIUM PHOSPHATE 4 MG/ML IJ SOLN
4.0000 mg | Freq: Once | INTRAMUSCULAR | Status: AC
  Administered 2023-12-23: 4 mg via INTRAVENOUS
  Filled 2023-12-23: qty 1

## 2023-12-23 MED ORDER — BUTALBITAL-APAP-CAFFEINE 50-325-40 MG PO TABS: 1.0000 | ORAL_TABLET | Freq: Four times a day (QID) | ORAL | 0 refills | Status: AC | PRN

## 2023-12-23 MED ORDER — METOCLOPRAMIDE HCL 5 MG/ML IJ SOLN
10.0000 mg | Freq: Once | INTRAMUSCULAR | Status: AC
  Administered 2023-12-23: 10 mg via INTRAVENOUS
  Filled 2023-12-23: qty 2

## 2023-12-23 MED ORDER — DIPHENHYDRAMINE HCL 50 MG/ML IJ SOLN
25.0000 mg | Freq: Once | INTRAMUSCULAR | Status: AC
  Administered 2023-12-23: 25 mg via INTRAVENOUS
  Filled 2023-12-23: qty 1

## 2023-12-23 MED ORDER — KETOROLAC TROMETHAMINE 15 MG/ML IJ SOLN
15.0000 mg | Freq: Once | INTRAMUSCULAR | Status: AC
  Administered 2023-12-23: 15 mg via INTRAVENOUS
  Filled 2023-12-23: qty 1

## 2023-12-23 MED ORDER — METHOCARBAMOL 500 MG PO TABS: 500.0000 mg | ORAL_TABLET | Freq: Four times a day (QID) | ORAL | 0 refills | Status: DC | PRN

## 2023-12-23 NOTE — ED Notes (Signed)
 DC paperwork given and verbally understood.

## 2023-12-23 NOTE — ED Provider Notes (Signed)
 [place] EMERGENCY DEPARTMENT AT [facility] Provider Note   CSN: [id number] Arrival date & time: 12/23/23  0745     Patient presents with: Headache   [name] [name] is a 43 y.o. female.   43 year old female presents for evaluation of headache.  States has been for 3 days.  States she was given a shot of Toradol  that helped a few days ago.  States has been taking Excedrin at home without relief.  She admits to some nausea as well but denies any vision changes, numbness or tingling, falls or injury, or any other symptoms or concerns at this time.   Headache Associated symptoms: no abdominal pain, no back pain, no cough, no ear pain, no eye pain, no fever, no seizures, no sore throat and no vomiting        Prior to Admission medications   Medication Sig Start Date End Date Taking? Authorizing Provider  butalbital-acetaminophen -caffeine (FIORICET) 50-325-40 MG tablet Take 1 tablet by mouth every 6 (six) hours as needed for up to 7 days for headache or migraine. 12/23/23 12/30/23 Yes Jontae Adebayo L, DO  methocarbamol  (ROBAXIN ) 500 MG tablet Take 1 tablet (500 mg total) by mouth every 6 (six) hours as needed for up to 7 days for muscle spasms (pain). 12/23/23 12/30/23 Yes Kayzen Kendzierski L, DO  albuterol  (VENTOLIN  HFA) 108 (90 Base) MCG/ACT inhaler Inhale 2 puffs into the lungs every 6 (six) hours as needed for wheezing or shortness of breath. Patient taking differently: Inhale 2 puffs into the lungs 2 (two) times daily. Inhale 2 puffs twice daily after using advair doses, may inhale 2 puffs every 6 hours as needed for shortness of breath 02/02/19   Gaylyn Keas, Mary-Margaret, FNP  buPROPion  (WELLBUTRIN  XL) 300 MG 24 hr tablet Take 300 mg by mouth at bedtime.    [provider]  busPIRone  (BUSPAR ) 10 MG tablet Take 10 mg by mouth 3 (three) times daily.    [provider]  famotidine  (PEPCID ) 40 MG tablet Take 40 mg by mouth every evening.    [provider]  Fluticasone -Salmeterol (ADVAIR) 500-50 MCG/DOSE AEPB Inhale 1 puff into the lungs daily.     [provider]  guanFACINE (INTUNIV) 1 MG TB24 ER tablet Take 1 mg by mouth daily. 05/26/23   [provider]  oxyCODONE  (OXY IR/ROXICODONE ) 5 MG immediate release tablet Take 1 tablet (5 mg total) by mouth every 6 (six) hours as needed for severe pain (pain score 7-10) or moderate pain (pain score 4-6). 06/17/23   Lyna Sandhoff, PA-C  pantoprazole  (PROTONIX ) 40 MG tablet Take 40 mg by mouth daily.    [provider]  sucralfate  (CARAFATE ) 1 g tablet Take 1 tablet (1 g total) by mouth 4 (four) times daily -  with meals and at bedtime. 06/17/23   Lyna Sandhoff, PA-C  VRAYLAR 1.5 MG capsule Take 1.5 mg by mouth daily. 05/26/23   [provider]    Allergies: Duloxetine hcl, Nitrofurantoin monohyd macro, Aspirin, and Morphine and codeine    Review of Systems  Constitutional:  Negative for chills and fever.  HENT:  Negative for ear pain and sore throat.   Eyes:  Negative for pain and visual disturbance.  Respiratory:  Negative for cough and shortness of breath.   Cardiovascular:  Negative for chest pain and palpitations.  Gastrointestinal:  Negative for abdominal pain and vomiting.  Genitourinary:  Negative for dysuria and hematuria.  Musculoskeletal:  Negative for arthralgias and back pain.  Skin:  Negative for color change and rash.  Neurological:  Positive for headaches. Negative for seizures and syncope.  All other systems reviewed and are negative.   Updated Vital Signs BP 103/73 (BP Location: Right Arm)   Pulse 66   Temp 98.4 F (36.9 C) (Oral)   Resp 18   Ht 5' 2 (1.575 m)   Wt 44.5 kg   LMP 05/09/2013   SpO2 99%   BMI 17.92 kg/m   Physical Exam Vitals and nursing note reviewed.  Constitutional:      General: She is not in acute distress.    Appearance: She is well-developed. She is not ill-appearing.  HENT:     Head: Normocephalic and  atraumatic.   Eyes:     Conjunctiva/sclera: Conjunctivae normal.    Cardiovascular:     Rate and Rhythm: Normal rate and regular rhythm.     Heart sounds: No murmur heard. Pulmonary:     Effort: Pulmonary effort is normal. No respiratory distress.     Breath sounds: Normal breath sounds.  Abdominal:     Palpations: Abdomen is soft.     Tenderness: There is no abdominal tenderness.   Musculoskeletal:        General: No swelling.     Cervical back: Neck supple.   Skin:    General: Skin is warm and dry.     Capillary Refill: Capillary refill takes less than 2 seconds.   Neurological:     Mental Status: She is alert and oriented to person, place, and time.     Cranial Nerves: No cranial nerve deficit.   Psychiatric:        Mood and Affect: Mood normal.     (all labs ordered are listed, but only abnormal results are displayed) Labs Reviewed - No data to display  EKG: None  Radiology: No results found.   Procedures   Medications Ordered in the ED  ketorolac  (TORADOL ) 15 MG/ML injection 15 mg (15 mg Intravenous Given 12/23/23 0941)  metoCLOPramide  (REGLAN ) injection 10 mg (10 mg Intravenous Given 12/23/23 0938)  diphenhydrAMINE  (BENADRYL ) injection 25 mg (25 mg Intravenous Given 12/23/23 0936)  dexamethasone  (DECADRON ) injection 4 mg (4 mg Intravenous Given 12/23/23 0940)    Clinical Course as of 12/23/23 1129  Wed Dec 23, 2023  1022 Recheck: patient reevaluated. Pain resolved [MK]    Clinical Course User Index [MK] Kelle Pate, DO                                 Medical Decision Making Medical Decision Making Nursing notes are reviewed. Differential diagnosis for this patient would include but not limited to: Occipital neuralgia, migraine headache, tension headache, trigeminal neuralgia, other  Records reviewed: Outpatient records reviewed and patient was seen in the OB/GYN office yesterday for vaginal discharge  Emergency Department Course:  Vital  signs and pulse oximetry are reviewed, evaluated by myself and found to be within normal limits prior to final disposition. Findings of laboratory testing and medical imaging are discussed with patient and family that is available. Patient agrees with the medical care plan as follows:  Patient here for what is likely occipital neuralgia.  Given migraine cocktail and a dose of steroids and feeling much better.  Will give her prescription for Robaxin  she is advised to use Tylenol  Motrin as needed and I will also give her prescription for Fioricet.  Advise close up with primary  care doctor otherwise return to the ER for any worsening symptoms.  She feels comfortable being discharged home.  Advised return for any new or worsening symptoms.  Problems Addressed: Occipital neuralgia, unspecified laterality: acute illness or injury  Amount and/or Complexity of Data Reviewed External Data Reviewed: notes.  Risk OTC drugs. Prescription drug management. Drug therapy requiring intensive monitoring for toxicity.    Final diagnoses:  Occipital neuralgia, unspecified laterality    ED Discharge Orders          Ordered    butalbital-acetaminophen -caffeine (FIORICET) 50-325-40 MG tablet  Every 6 hours PRN        12/23/23 1026    methocarbamol  (ROBAXIN ) 500 MG tablet  Every 6 hours PRN        12/23/23 1026               Sameen Leas L, DO 12/23/23 1129

## 2023-12-23 NOTE — Discharge Instructions (Signed)
 You can take ibuprofen 800 mg up to 3 times a day as needed for pain.  You can continue Excedrin or he can use your Fioricet as needed for headaches.  You can also use Robaxin  up to 4 times a day.  Follow-up with your primary care doctor.  Return to the ER for any new or worsening symptoms.

## 2023-12-23 NOTE — ED Triage Notes (Signed)
 Pt caox4, ambulatory c/o shooting/stabbing pain in the back of the head with a swollen spot that has been worsening for the past 3 days. Last took Excedrin at approx 0600 this morning without relief.

## 2023-12-29 ENCOUNTER — Encounter (HOSPITAL_COMMUNITY): Payer: Self-pay

## 2023-12-29 ENCOUNTER — Other Ambulatory Visit: Payer: Self-pay

## 2023-12-29 ENCOUNTER — Emergency Department (HOSPITAL_COMMUNITY): Payer: MEDICAID

## 2023-12-29 ENCOUNTER — Emergency Department (HOSPITAL_COMMUNITY)
Admission: EM | Admit: 2023-12-29 | Discharge: 2023-12-29 | Disposition: A | Discharge status: Home / Self Care | Payer: MEDICAID | Attending: Emergency Medicine | Admitting: Emergency Medicine

## 2023-12-29 DIAGNOSIS — E860 Dehydration: Secondary | ICD-10-CM | POA: Diagnosis not present

## 2023-12-29 DIAGNOSIS — J45909 Unspecified asthma, uncomplicated: Secondary | ICD-10-CM | POA: Insufficient documentation

## 2023-12-29 DIAGNOSIS — D72829 Elevated white blood cell count, unspecified: Secondary | ICD-10-CM | POA: Insufficient documentation

## 2023-12-29 DIAGNOSIS — R42 Dizziness and giddiness: Secondary | ICD-10-CM | POA: Insufficient documentation

## 2023-12-29 DIAGNOSIS — R519 Headache, unspecified: Secondary | ICD-10-CM | POA: Diagnosis present

## 2023-12-29 LAB — BASIC METABOLIC PANEL WITH GFR
Anion gap: 8 (ref 5–15)
BUN: 17 mg/dL (ref 6–20)
CO2: 21 mmol/L — ABNORMAL LOW (ref 22–32)
Calcium: 8.4 mg/dL — ABNORMAL LOW (ref 8.9–10.3)
Chloride: 108 mmol/L (ref 98–111)
Creatinine, Ser: 1.15 mg/dL — ABNORMAL HIGH (ref 0.44–1.00)
GFR, Estimated: 60 mL/min — ABNORMAL LOW (ref >60)
Glucose, Bld: 169 mg/dL — ABNORMAL HIGH (ref 70–99)
Potassium: 4 mmol/L (ref 3.5–5.1)
Sodium: 137 mmol/L (ref 135–145)

## 2023-12-29 LAB — CBC WITH DIFFERENTIAL/PLATELET
Abs Immature Granulocytes: 0.08 10*3/uL — ABNORMAL HIGH (ref 0.00–0.07)
Basophils Absolute: 0 10*3/uL (ref 0.0–0.1)
Basophils Relative: 0 %
Eosinophils Absolute: 0 10*3/uL (ref 0.0–0.5)
Eosinophils Relative: 0 %
HCT: 41.1 % (ref 36.0–46.0)
Hemoglobin: 13.8 g/dL (ref 12.0–15.0)
Immature Granulocytes: 1 %
Lymphocytes Relative: 5 %
Lymphs Abs: 0.6 10*3/uL — ABNORMAL LOW (ref 0.7–4.0)
MCH: 32 pg (ref 26.0–34.0)
MCHC: 33.6 g/dL (ref 30.0–36.0)
MCV: 95.4 fL (ref 80.0–100.0)
Monocytes Absolute: 0.8 10*3/uL (ref 0.1–1.0)
Monocytes Relative: 7 %
Neutro Abs: 9.9 10*3/uL — ABNORMAL HIGH (ref 1.7–7.7)
Neutrophils Relative %: 87 %
Platelets: 198 10*3/uL (ref 150–400)
RBC: 4.31 MIL/uL (ref 3.87–5.11)
RDW: 12.3 % (ref 11.5–15.5)
WBC: 11.5 10*3/uL — ABNORMAL HIGH (ref 4.0–10.5)
nRBC: 0 % (ref 0.0–0.2)

## 2023-12-29 MED ORDER — DIPHENHYDRAMINE HCL 50 MG/ML IJ SOLN
25.0000 mg | Freq: Once | INTRAMUSCULAR | Status: AC
  Administered 2023-12-29: 25 mg via INTRAVENOUS
  Filled 2023-12-29: qty 1

## 2023-12-29 MED ORDER — IOHEXOL 350 MG/ML SOLN
75.0000 mL | Freq: Once | INTRAVENOUS | Status: AC | PRN
  Administered 2023-12-29: 75 mL via INTRAVENOUS

## 2023-12-29 MED ORDER — HYDROCODONE-ACETAMINOPHEN 5-325 MG PO TABS: 1.0000 | ORAL_TABLET | ORAL | 0 refills | Status: AC | PRN

## 2023-12-29 MED ORDER — MAGNESIUM OXIDE 400 MG PO TABS: 400.0000 mg | ORAL_TABLET | Freq: Every day | ORAL | 0 refills | Status: AC

## 2023-12-29 MED ORDER — HYDROCODONE-ACETAMINOPHEN 5-325 MG PO TABS
1.0000 | ORAL_TABLET | Freq: Once | ORAL | Status: AC
  Administered 2023-12-29: 1 via ORAL
  Filled 2023-12-29: qty 1

## 2023-12-29 MED ORDER — LIDOCAINE 5 % EX PTCH
1.0000 | MEDICATED_PATCH | Freq: Once | CUTANEOUS | Status: DC
  Administered 2023-12-29: 1 via TRANSDERMAL
  Filled 2023-12-29: qty 1

## 2023-12-29 MED ORDER — DEXAMETHASONE 4 MG PO TABS
8.0000 mg | ORAL_TABLET | Freq: Once | ORAL | Status: AC
  Administered 2023-12-29: 8 mg via ORAL
  Filled 2023-12-29: qty 2

## 2023-12-29 MED ORDER — KETOROLAC TROMETHAMINE 15 MG/ML IJ SOLN
15.0000 mg | Freq: Once | INTRAMUSCULAR | Status: AC
  Administered 2023-12-29: 15 mg via INTRAVENOUS
  Filled 2023-12-29: qty 1

## 2023-12-29 MED ORDER — LIDOCAINE 5 % EX PTCH: 1.0000 | MEDICATED_PATCH | Freq: Every day | CUTANEOUS | 0 refills | Status: DC | PRN

## 2023-12-29 MED ORDER — CYCLOBENZAPRINE HCL 10 MG PO TABS: 10.0000 mg | ORAL_TABLET | Freq: Two times a day (BID) | ORAL | 0 refills | Status: AC | PRN

## 2023-12-29 MED ORDER — PROCHLORPERAZINE EDISYLATE 10 MG/2ML IJ SOLN
5.0000 mg | Freq: Once | INTRAMUSCULAR | Status: AC
  Administered 2023-12-29: 5 mg via INTRAVENOUS
  Filled 2023-12-29: qty 2

## 2023-12-29 MED ORDER — SODIUM CHLORIDE 0.9 % IV BOLUS
1000.0000 mL | Freq: Once | INTRAVENOUS | Status: AC
  Administered 2023-12-29: 1000 mL via INTRAVENOUS

## 2023-12-29 NOTE — ED Triage Notes (Addendum)
 Pt to ED via [facility] from home. Pt has had a headache for a few days and then had a syncopal episode in car w/friend. This lasted approximately 15 seconds and then pt was able to walk into house.   Pt states she has been in the bed for past week for right sided headache. Pt states she is unable to move her neck. Pt states she tried to get into neurologist but they did not have any appointments available. Pt has seen PCP for same. Pt states she has not been able to eat and drink because she feels so drained and has so much pain. Pt states she feels like this is why she passed out.   18g LFA 1000mL NS given 102/ > 122/ 120 > 100bpm after fluid

## 2023-12-29 NOTE — ED Provider Triage Note (Signed)
 Emergency Medicine Provider Triage Evaluation Note  [name] [name] , a 43 y.o. female  was evaluated in triage.  Pt complains of 1 week of posterior head and upper back pain.  Rates the pain as severe.  Today had a syncopal event while sitting.  Had used some THC, not unusual for the patient..  She was dehydrated  Review of Systems  Positive: Syncope Negative: Fever  Physical Exam  BP (!) 111/99 (BP Location: Right Arm)   Pulse 98   Temp 98.9 F (37.2 C) (Oral)   Resp 16   Ht 5' 2 (1.575 m)   Wt 44.5 kg   LMP 05/09/2013   SpO2 98%   BMI 17.94 kg/m  Gen:   Awake, no distress   Resp:  Normal effort  MSK:   Moves extremities without difficulty  Other:    Medical Decision Making  Medically screening exam initiated at 1:30 PM.  Appropriate orders placed.  [name] [name] was informed that the remainder of the evaluation will be completed by another provider, this initial triage assessment does not replace that evaluation, and the importance of remaining in the ED until their evaluation is complete.     [name] Ozell BROCKS, MD 12/29/23 1331

## 2023-12-29 NOTE — Discharge Instructions (Signed)
 It was a pleasure caring for you today in the emergency department.  You should continue to improve over the next couple days.  Be sure to get plenty of rest, drink plenty of fluids.  Have someone stay with you until you feel stable. Lie down right away if you start feeling like you might faint. Breathe deeply and steadily. Wait until all the symptoms have passed.Drink enough fluids to keep your urine clear or pale yellow. If you are taking blood pressure or heart medicine, get up slowly, taking several minutes to sit and then stand. This can reduce dizziness. SEEK IMMEDIATE MEDICAL CARE IF: You have a severe headache. You have unusual pain in the chest, abdomen, or back. You are bleeding from the mouth or rectum, or you have a black or tarry stool. You have an irregular or very fast heartbeat. You have pain with breathing. You have repeated fainting or seizure-like jerking during an episode. You faint when sitting or lying down. You have confusion. You have difficulty walking. You have severe weakness. You have vision problems. If you fainted, call your local emergency services - do not drive yourself to the hospital.   Please return to the emergency department immediately for any new or concerning symptoms, or if you get worse.

## 2023-12-29 NOTE — ED Provider Notes (Signed)
 [place] EMERGENCY DEPARTMENT AT [facility] Provider Note  CSN: [id number] Arrival date & time: 12/29/23 1315  Chief Complaint(s) Loss of Consciousness  HPI [name] [name] is a 43 y.o. female with past medical history as below, significant for asthma, GERD, dysmenorrhea, depression who presents to the ED with complaint of headache  Patient reports has been having ongoing headache for over a week.  Pain primarily to the back of her neck, occipital area on the right side.  No pain to her face, no pain with eye movement.  No vision or hearing changes, no numbness or weakness to extremities, no hearing loss.  Denies any trauma but does report she manipulates her neck frequently, cracks her neck.  Has not been manipulated by chiropractor.  She has no nausea or vomiting.  Patient reports that she has been using heating pad at home which has provided some relief, she was prescribed Fioricet at recent ER visit which has not provided relief.   She also reported in triage that she had a possible LOC earlier today, she felt lightheaded prior to this, had a prodrome, no seizure activity, no incontinence or vomiting.  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 Authorizing Provider  cyclobenzaprine  (FLEXERIL ) 10 MG tablet Take 1 tablet (10 mg total) by mouth 2 (two) times daily as needed for muscle spasms. 12/29/23  Yes Elnor Savant A, DO  HYDROcodone -acetaminophen  (NORCO/VICODIN) 5-325 MG tablet Take 1 tablet by mouth every 4 (four) hours as needed. 12/29/23  Yes Elnor Savant A, DO  lidocaine  (LIDODERM ) 5 % Place 1 patch onto the skin daily as needed. Remove & Discard patch within 12 hours or as directed by MD 12/29/23  Yes Elnor Savant A, DO  magnesium  oxide (MAG-OX) 400 MG tablet Take 1 tablet (400 mg total) by mouth daily for 7 days. 12/29/23 01/05/24 Yes Elnor Savant LABOR, DO  albuterol  (VENTOLIN  HFA) 108 (90 Base) MCG/ACT inhaler Inhale 2 puffs into the lungs every 6 (six) hours as needed for wheezing or shortness of breath. Patient taking differently: Inhale 2 puffs into the lungs 2 (two) times daily. Inhale 2 puffs twice daily after using advair doses, may inhale 2 puffs every 6 hours as needed for shortness of breath 02/02/19   Gladis Mustard, FNP  buPROPion  (WELLBUTRIN  XL) 300 MG 24 hr tablet Take 300 mg by mouth at bedtime.  [provider]  busPIRone  (BUSPAR ) 10 MG tablet Take 10 mg by mouth 3 (three) times daily.    [provider]  butalbital-acetaminophen -caffeine (FIORICET) 50-325-40 MG tablet Take 1 tablet by mouth every 6 (six) hours as needed for up to 7 days for headache or migraine. 12/23/23 12/30/23  Kammerer, Megan L, DO  famotidine  (PEPCID ) 40 MG tablet Take 40 mg by mouth every evening.    [provider]  Fluticasone -Salmeterol (ADVAIR) 500-50 MCG/DOSE AEPB Inhale 1 puff into the lungs daily.     [provider]  guanFACINE  (INTUNIV) 1 MG TB24 ER tablet Take 1 mg by mouth daily. 05/26/23   [provider]  pantoprazole  (PROTONIX ) 40 MG tablet Take 40 mg by mouth daily.    [provider]  sucralfate  (CARAFATE ) 1 g tablet Take 1 tablet (1 g total) by mouth 4 (four) times daily -  with meals and at bedtime. 06/17/23   Desiderio Chew, PA-C  VRAYLAR 1.5 MG capsule Take 1.5 mg by mouth daily. 05/26/23   [provider]                                                                                                                                    Past Surgical History Past Surgical History:  Procedure Laterality Date   BILATERAL SALPINGECTOMY Bilateral 05/31/2013   Procedure: BILATERAL SALPINGECTOMY;  Surgeon: Krystal Deaner, MD;  Location: [facility];  Service: Gynecology;  Laterality: Bilateral;   BREAST CYST ASPIRATION Right 2015   BREAST CYST EXCISION Right    2015   DILATION AND CURETTAGE OF UTERUS     KIDNEY STONE SURGERY     KNEE ARTHROSCOPY Right    KNEE ARTHROSCOPY WITH DRILLING/MICROFRACTURE Right 07/26/2014   Procedure: RIGHT KNEE ARTHROSCOPY WITH CHONDROPLASTY, MICROFRACTURE;  Surgeon: Kay Ozell Cummins, MD;  Location: [facility];  Service: Orthopedics;  Laterality: Right;   KNEE ARTHROSCOPY WITH MEDIAL MENISECTOMY Right 07/26/2014   Procedure: KNEE ARTHROSCOPY WITH MEDIAL MENISECTOMY;  Surgeon: Kay Ozell Cummins, MD;  Location: [facility];  Service: Orthopedics;  Laterality: Right;   KNEE ARTHROSCOPY WITH MEDIAL MENISECTOMY Left 05/07/2022   Procedure: LEFT KNEE ARTHROSCOPY WITH DEBRIDEMENT AND CHONDROPLASTY PARTIAL MEDIAL MENISCECTOMY;  Surgeon: Cummins Kay HERO, MD;  Location: [facility];  Service: Orthopedics;  Laterality: Left;   LAPAROSCOPIC ASSISTED VAGINAL HYSTERECTOMY N/A 05/31/2013   Procedure: LAPAROSCOPIC ASSISTED VAGINAL HYSTERECTOMY;  Surgeon: Krystal Deaner, MD;  Location: [facility];  Service: Gynecology;  Laterality: N/A;    LAPAROSCOPY N/A 08/26/2017   Procedure: LAPAROSCOPY OPERATIVE WITH PERIOTONEAL BIOPSY AND FULGERATION OF ENDOMETRIOSIS;  Surgeon: Deaner Krystal, MD;  Location: [facility];  Service: Gynecology;  Laterality: N/A;   LUMBAR LAMINECTOMY/DECOMPRESSION MICRODISCECTOMY Right 12/31/2018   Procedure: MICRODISCECTOMY LUMBAR 5- SACRAL1;  Surgeon: Lanis Pupa, MD;  Location: [facility];  Service: Neurosurgery;  Laterality: Right;  MICRODISCECTOMY LUMBAR 5- SACRAL1   TONSILLECTOMY  TONSILLECTOMY AND ADENOIDECTOMY     TOTAL KNEE ARTHROPLASTY Right 08/08/2019   Procedure: RIGHT TOTAL KNEE ARTHROPLASTY;  Surgeon: Jerri Kay HERO, MD;  Location: [facility];  Service: Orthopedics;  Laterality: Right;   TUBAL LIGATION     UPPER GI ENDOSCOPY     ulcer   Family History Family History  Problem Relation Age of Onset   Hypertension Mother    Diabetes Mother    Breast cancer Mother 20       IDC; s/p lumpectomy, radiation, and arimidex   Other Sister        both full sisters also have dense breast tissue   Breast cancer Maternal Grandmother 29       IDC   Colon polyps Maternal Grandmother        approx 4-5 colon polyps   Prostate cancer Paternal Grandfather        dx. 32s-60s   Cancer Cousin 25       paternal 1st cousin dx. w/ either throat or thyroid cancer; not a smoker   Pancreatic cancer Other        maternal great aunt (MGF's sister) d. pancreatic cancer   Cancer Other        maternal great aunt (MGF's sister) dx NOS cancer   Cancer Other        maternal great grandmother (MGF's mother) d. NOS cancer in her 33s   Breast cancer Other        paternal great aunt (PGF's sister) dx. breast cancer, 11s   Colon cancer Neg Hx    Rectal cancer Neg Hx    Stomach cancer Neg Hx    Esophageal cancer Neg Hx     Social History Social History   Tobacco Use   Smoking status: Never   Smokeless tobacco: Never  Vaping Use   Vaping status: Never Used  Substance Use Topics   Alcohol use: No    Alcohol/week:  0.0 standard drinks of alcohol   Drug use: No   Allergies Duloxetine hcl, Nitrofurantoin monohyd macro, Aspirin, and Morphine and codeine  Review of Systems A thorough review of systems was obtained and all systems are negative except as noted in the HPI and PMH.   Physical Exam Vital Signs  I have reviewed the triage vital signs BP (!) 136/91 (BP Location: Left Arm)   Pulse (!) 108   Temp 98.7 F (37.1 C) (Oral)   Resp 18   Ht 5' 2 (1.575 m)   Wt 44.5 kg   LMP 05/09/2013   SpO2 100%   BMI 17.94 kg/m  Physical Exam Vitals and nursing note reviewed.  Constitutional:      General: She is not in acute distress.    Appearance: Normal appearance.  HENT:     Head: Normocephalic and atraumatic.     Right Ear: External ear normal.     Left Ear: External ear normal.     Nose: Nose normal.     Mouth/Throat:     Mouth: Mucous membranes are moist.   Eyes:     General: No scleral icterus.       Right eye: No discharge.        Left eye: No discharge.     Extraocular Movements: Extraocular movements intact.     Pupils: Pupils are equal, round, and reactive to light.   Neck:     Vascular: No carotid bruit.     Trachea: Trachea and phonation normal.    Cardiovascular:  Rate and Rhythm: Normal rate and regular rhythm.     Pulses: Normal pulses.     Heart sounds: Normal heart sounds.  Pulmonary:     Effort: Pulmonary effort is normal. No respiratory distress.     Breath sounds: Normal breath sounds. No stridor.  Abdominal:     General: Abdomen is flat. There is no distension.     Palpations: Abdomen is soft.     Tenderness: There is no abdominal tenderness.   Musculoskeletal:     Cervical back: No rigidity. Pain with movement present. No spinous process tenderness.     Right lower leg: No edema.     Left lower leg: No edema.   Skin:    General: Skin is warm and dry.     Capillary Refill: Capillary refill takes less than 2 seconds.   Neurological:     Mental  Status: She is alert and oriented to person, place, and time.     GCS: GCS eye subscore is 4. GCS verbal subscore is 5. GCS motor subscore is 6.     Cranial Nerves: Cranial nerves 2-12 are intact. No dysarthria or facial asymmetry.     Sensory: Sensation is intact. No sensory deficit.     Motor: No tremor.     Coordination: Coordination is intact. Coordination normal.     Comments: Strength 5/5 to BLUE/BLLE, equal and symmetric  Gait testing deferred secondary to patient safety.   Psychiatric:        Mood and Affect: Mood normal.        Behavior: Behavior normal. Behavior is cooperative.     ED Results and Treatments Labs (all labs ordered are listed, but only abnormal results are displayed) Labs Reviewed  BASIC METABOLIC PANEL WITH GFR - Abnormal; Notable for the following components:      Result Value   CO2 21 (*)    Glucose, Bld 169 (*)    Creatinine, Ser 1.15 (*)    Calcium 8.4 (*)    GFR, Estimated 60 (*)    All other components within normal limits  CBC WITH DIFFERENTIAL/PLATELET - Abnormal; Notable for the following components:   WBC 11.5 (*)    Neutro Abs 9.9 (*)    Lymphs Abs 0.6 (*)    Abs Immature Granulocytes 0.08 (*)    All other components within normal limits                                                                                                                          Radiology CT ANGIO HEAD NECK W WO CM Result Date: 12/29/2023 CLINICAL DATA:  Initial evaluation for acute loss of consciousness. EXAM: CT ANGIOGRAPHY HEAD AND NECK WITH AND WITHOUT CONTRAST TECHNIQUE: Multidetector CT imaging of the head and neck was performed using the standard protocol during bolus administration of intravenous contrast. Multiplanar CT image reconstructions and MIPs were obtained to evaluate the vascular anatomy. Carotid stenosis measurements (when applicable) are obtained utilizing NASCET  criteria, using the distal internal carotid diameter as the denominator. RADIATION  DOSE REDUCTION: This exam was performed according to the departmental dose-optimization program which includes automated exposure control, adjustment of the mA and/or kV according to patient size and/or use of iterative reconstruction technique. CONTRAST:  75mL OMNIPAQUE  IOHEXOL  350 MG/ML SOLN COMPARISON:  None Available. FINDINGS: CT HEAD FINDINGS Brain: Cerebral volume within normal limits for patient age. No acute intracranial hemorrhage. No acute large vessel territory infarct. No mass lesion, midline shift, or mass effect. Ventricles are normal in size without hydrocephalus. No extra-axial fluid collection. Vascular: No abnormal hyperdense vessel. Skull: Scalp soft tissues demonstrate no acute abnormality. Calvarium intact. Sinuses/Orbits: Globes and orbital soft tissues within normal limits. Visualized paranasal sinuses are largely clear. No significant mastoid effusion. CTA NECK FINDINGS Aortic arch: Standard branching. Imaged portion shows no evidence of aneurysm or dissection. No significant stenosis of the major arch vessel origins. Right carotid system: No evidence of dissection, stenosis (50% or greater), or occlusion. Left carotid system: No evidence of dissection, stenosis (50% or greater), or occlusion. Vertebral arteries: Left vertebral artery hypoplastic and arises directly from the aortic arch. Strongly dominant right vertebral artery. Vertebral arteries patent without stenosis or dissection. Skeleton: No worrisome osseous lesions. Moderate multilevel cervical spondylosis, most pronounced at C5-6 and C6-7. Other neck: No other acute finding. Upper chest: No other acute finding. Review of the MIP images confirms the above findings CTA HEAD FINDINGS Anterior circulation: Both internal carotid arteries are patent to the termini without stenosis. A1 segments patent bilaterally. Normal anterior communicating artery complex. Anterior cerebral arteries patent without stenosis. No M1 stenosis or occlusion.  Right M1 bifurcates early. Distal MCA branches perfused and symmetric. Posterior circulation: Both V4 segments patent without stenosis. Both PICA patent. Basilar patent without stenosis. Superior cerebellar arteries patent bilaterally. Right PCA supplied via the basilar. Predominant fetal type origin of the left PCA. Both PCAs patent without stenosis. Venous sinuses: Patent allowing for timing the contrast bolus. Anatomic variants: As above.  No aneurysm. Review of the MIP images confirms the above findings IMPRESSION: 1. Normal CTA of the head and neck. No large vessel occlusion, hemodynamically significant stenosis, or other acute vascular abnormality. 2. No other acute intracranial abnormality. Electronically Signed   By: Morene Hoard M.D.   On: 12/29/2023 22:32   CT Head Wo Contrast Result Date: 12/29/2023 CLINICAL DATA:  Headache, no stated injury cervical radiculopathy, infection suspected EXAM: CT HEAD WITHOUT CONTRAST CT CERVICAL SPINE WITHOUT CONTRAST TECHNIQUE: Multidetector CT imaging of the head and cervical spine was performed following the standard protocol without intravenous contrast. Multiplanar CT image reconstructions of the cervical spine were also generated. RADIATION DOSE REDUCTION: This exam was performed according to the departmental dose-optimization program which includes automated exposure control, adjustment of the mA and/or kV according to patient size and/or use of iterative reconstruction technique. COMPARISON:  09/09/2008 FINDINGS: CT HEAD FINDINGS Brain: No evidence of acute infarction, hemorrhage, hydrocephalus, extra-axial collection or mass lesion/mass effect. Vascular: No hyperdense vessel or unexpected calcification. Skull: Normal. Negative for fracture or focal lesion. Sinuses/Orbits: No acute finding. Other: None. CT CERVICAL SPINE FINDINGS Alignment: Degenerative straightening of the normal cervical lordosis. Skull base and vertebrae: No acute fracture. No primary  bone lesion or focal pathologic process. Soft tissues and spinal canal: No prevertebral fluid or swelling. No visible canal hematoma. Disc levels: Moderate multilevel cervical disc degenerative disease, worst from C5-C7. Upper chest: Negative. Other: None. IMPRESSION: 1. No acute intracranial pathology. No noncontrast CT  findings to explain headache. 2. No fracture or static subluxation of the cervical spine. 3. Moderate multilevel cervical disc degenerative disease, worst from C5-C7. 4. No noncontrast CT evidence of infection in the head or neck. Note that contrast enhanced MRI would be the imaging test of choice for intracranial or cervical infection such as cerebritis, abscess, or discitis/osteomyelitis. Electronically Signed   By: Marolyn JONETTA Jaksch M.D.   On: 12/29/2023 14:13   CT Cervical Spine Wo Contrast Result Date: 12/29/2023 CLINICAL DATA:  Headache, no stated injury cervical radiculopathy, infection suspected EXAM: CT HEAD WITHOUT CONTRAST CT CERVICAL SPINE WITHOUT CONTRAST TECHNIQUE: Multidetector CT imaging of the head and cervical spine was performed following the standard protocol without intravenous contrast. Multiplanar CT image reconstructions of the cervical spine were also generated. RADIATION DOSE REDUCTION: This exam was performed according to the departmental dose-optimization program which includes automated exposure control, adjustment of the mA and/or kV according to patient size and/or use of iterative reconstruction technique. COMPARISON:  09/09/2008 FINDINGS: CT HEAD FINDINGS Brain: No evidence of acute infarction, hemorrhage, hydrocephalus, extra-axial collection or mass lesion/mass effect. Vascular: No hyperdense vessel or unexpected calcification. Skull: Normal. Negative for fracture or focal lesion. Sinuses/Orbits: No acute finding. Other: None. CT CERVICAL SPINE FINDINGS Alignment: Degenerative straightening of the normal cervical lordosis. Skull base and vertebrae: No acute  fracture. No primary bone lesion or focal pathologic process. Soft tissues and spinal canal: No prevertebral fluid or swelling. No visible canal hematoma. Disc levels: Moderate multilevel cervical disc degenerative disease, worst from C5-C7. Upper chest: Negative. Other: None. IMPRESSION: 1. No acute intracranial pathology. No noncontrast CT findings to explain headache. 2. No fracture or static subluxation of the cervical spine. 3. Moderate multilevel cervical disc degenerative disease, worst from C5-C7. 4. No noncontrast CT evidence of infection in the head or neck. Note that contrast enhanced MRI would be the imaging test of choice for intracranial or cervical infection such as cerebritis, abscess, or discitis/osteomyelitis. Electronically Signed   By: Marolyn JONETTA Jaksch M.D.   On: 12/29/2023 14:13    Pertinent labs & imaging results that were available during my care of the patient were reviewed by me and considered in my medical decision making (see MDM for details).  Medications Ordered in ED Medications  lidocaine  (LIDODERM ) 5 % 1 patch (1 patch Transdermal Patch Applied 12/29/23 2130)  prochlorperazine (COMPAZINE) injection 5 mg (5 mg Intravenous Given 12/29/23 2132)  diphenhydrAMINE  (BENADRYL ) injection 25 mg (25 mg Intravenous Given 12/29/23 2130)  sodium chloride  0.9 % bolus 1,000 mL (1,000 mLs Intravenous New Bag/Given 12/29/23 2135)  iohexol  (OMNIPAQUE ) 350 MG/ML injection 75 mL (75 mLs Intravenous Contrast Given 12/29/23 2207)  dexamethasone  (DECADRON ) tablet 8 mg (8 mg Oral Given 12/29/23 2251)  ketorolac  (TORADOL ) 15 MG/ML injection 15 mg (15 mg Intravenous Given 12/29/23 2252)  HYDROcodone -acetaminophen  (NORCO/VICODIN) 5-325 MG per tablet 1 tablet (1 tablet Oral Given 12/29/23 2251)  Procedures Procedures  (including critical care time)  Medical Decision Making  / ED Course    Medical Decision Making:    Emely Fahy is a 43 y.o. female with past medical history as below, significant for asthma, GERD, dysmenorrhea, depression who presents to the ED with complaint of headache. The complaint involves an extensive differential diagnosis and also carries with it a high risk of complications and morbidity.  Serious etiology was considered. Ddx includes but is not limited to: Differential diagnosis includes but is not exclusive to subarachnoid hemorrhage, meningitis, encephalitis, previous head trauma, cavernous venous thrombosis, muscle tension headache, glaucoma, temporal arteritis, migraine or migraine equivalent, etc.   Complete initial physical exam performed, notably the patient was in no acute distress, resting comfortably on stretcher.    Reviewed and confirmed nursing documentation for past medical history, family history, social history.  Vital signs reviewed.     Brief summary:  43 year old female with history as above here with ongoing right-sided headache. Neurologically she is intact. No trauma but does report she cracks her neck frequently   Clinical Course as of 12/29/23 2310  Tue Dec 29, 2023  2014 Creatinine(!): 1.15 Mild elev from prior  [SG]  2239 CT imaging of head/neck is stable [SG]    Clinical Course User Index [SG] Elnor Jayson LABOR, DO     Labs and imaging are stable, she is feeling much better.  Neurologically she is intact.  [place] syncope low risk  Patient presents with syncopal symptoms without worrisome features. Presentation most suggestive of neuro-cardiogenic or orthostatic cause. Very low suspicion for serious arrhythmia, cardiac ischemia or other serious etiology. ECG reviewed, no evidence of a cardiac arrhythmia such as [place], WPW, HOCM, IHSS, Long or short QT. Neurologic exam is nonfocal, not consistent with CVA or primary neurologic abnormality  Patient presents with headache. Based on  the patient's history and physical and workup today there is very low clinical suspicion for significant intracranial pathology. The headache was not sudden onset, not maximal at onset, there are no neurologic findings on exam, the patient does not have a fever, the patient does not have any jaw claudication, the patient does not endorse a clotting disorder, patient denies any trauma or eye pain and the headache is not associated with weakness on one side of the body, diplopia, vertigo, slurred speech, or ataxia. Given the extremely low risk of these diagnoses further testing and evaluation for these possibilities does not appear to be indicated at this time.   The patient improved significantly and was discharged in stable condition. Detailed discussions were had with the patient regarding current findings, and need for close f/u with PCP or on call doctor. The patient has been instructed to return immediately if the symptoms worsen in any way for re-evaluation. Patient verbalized understanding and is in agreement with current care plan. All questions answered prior to discharge.             Additional history obtained: -Additional history obtained from spouse -External records from outside source obtained and reviewed including: Chart review including previous notes, labs, imaging, consultation notes including  Prior er eval Prior labs PDMP    Lab Tests: -I ordered, reviewed, and interpreted labs.   The pertinent results include:   Labs Reviewed  BASIC METABOLIC PANEL WITH GFR - Abnormal; Notable for the following components:      Result Value   CO2 21 (*)    Glucose, Bld 169 (*)    Creatinine, Ser  1.15 (*)    Calcium 8.4 (*)    GFR, Estimated 60 (*)    All other components within normal limits  CBC WITH DIFFERENTIAL/PLATELET - Abnormal; Notable for the following components:   WBC 11.5 (*)    Neutro Abs 9.9 (*)    Lymphs Abs 0.6 (*)    Abs Immature Granulocytes 0.08 (*)     All other components within normal limits    Notable for stable labs  EKG   EKG Interpretation Date/Time:  Tuesday December 29 2023 13:21:13 EDT Ventricular Rate:  95 PR Interval:  114 QRS Duration:  72 QT Interval:  330 QTC Calculation: 414 R Axis:   88  Text Interpretation: Normal sinus rhythm Normal ECG When compared with ECG of 04-Aug-2019 13:15, No significant change since last tracing Confirmed by Towana Sharper [phone]) on 12/29/2023 1:23:32 PM         Imaging Studies ordered: CTH/CT cervical spine ordered in triage; I added CTA head/neck I independently visualized the following imaging with scope of interpretation limited to determining acute life threatening conditions related to emergency care; findings noted above I agree with the radiologist interpretation If any imaging was obtained with contrast I closely monitored patient for any possible adverse reaction a/w contrast administration in the emergency department   Medicines ordered and prescription drug management: Meds ordered this encounter  Medications   prochlorperazine (COMPAZINE) injection 5 mg   diphenhydrAMINE  (BENADRYL ) injection 25 mg   lidocaine  (LIDODERM ) 5 % 1 patch   sodium chloride  0.9 % bolus 1,000 mL   iohexol  (OMNIPAQUE ) 350 MG/ML injection 75 mL   dexamethasone  (DECADRON ) tablet 8 mg   ketorolac  (TORADOL ) 15 MG/ML injection 15 mg   HYDROcodone -acetaminophen  (NORCO/VICODIN) 5-325 MG per tablet 1 tablet    Refill:  0   cyclobenzaprine  (FLEXERIL ) 10 MG tablet    Sig: Take 1 tablet (10 mg total) by mouth 2 (two) times daily as needed for muscle spasms.    Dispense:  20 tablet    Refill:  0   magnesium  oxide (MAG-OX) 400 MG tablet    Sig: Take 1 tablet (400 mg total) by mouth daily for 7 days.    Dispense:  7 tablet    Refill:  0   HYDROcodone -acetaminophen  (NORCO/VICODIN) 5-325 MG tablet    Sig: Take 1 tablet by mouth every 4 (four) hours as needed.    Dispense:  10 tablet    Refill:  0    lidocaine  (LIDODERM ) 5 %    Sig: Place 1 patch onto the skin daily as needed. Remove & Discard patch within 12 hours or as directed by MD    Dispense:  15 patch    Refill:  0    -I have reviewed the patients home medicines and have made adjustments as needed   Consultations Obtained: na   Cardiac Monitoring: Continuous pulse oximetry interpreted by myself, 100% on ra.    Social Determinants of Health:  Diagnosis or treatment significantly limited by social determinants of health: na   Reevaluation: After the interventions noted above, I reevaluated the patient and found that they have improved  Co morbidities that complicate the patient evaluation  Past Medical History:  Diagnosis Date   Allergy    Arthritis    right knee   Asthma    Constipation    Depression    GERD (gastroesophageal reflux disease)    History of kidney stones    Panic disorder    Pneumonia  Renal disorder    Seasonal allergies    SVD (spontaneous vaginal delivery)    x 3      Dispostion: Disposition decision including need for hospitalization was considered, and patient discharged from emergency department.    Final Clinical Impression(s) / ED Diagnoses Final diagnoses:  Bad headache  Light headed  Mild dehydration        Elnor Jayson LABOR, DO 12/29/23 2310

## 2023-12-29 NOTE — ED Notes (Signed)
 Pt transported to CT ?

## 2023-12-31 ENCOUNTER — Encounter: Payer: Self-pay | Admitting: Neurology

## 2023-12-31 ENCOUNTER — Ambulatory Visit (INDEPENDENT_AMBULATORY_CARE_PROVIDER_SITE_OTHER): Payer: MEDICAID | Admitting: Neurology

## 2023-12-31 VITALS — BP 118/75 | HR 84 | Ht 62.0 in | Wt 96.8 lb

## 2023-12-31 DIAGNOSIS — R29898 Other symptoms and signs involving the musculoskeletal system: Secondary | ICD-10-CM | POA: Diagnosis not present

## 2023-12-31 DIAGNOSIS — R200 Anesthesia of skin: Secondary | ICD-10-CM

## 2023-12-31 DIAGNOSIS — M5481 Occipital neuralgia: Secondary | ICD-10-CM

## 2023-12-31 DIAGNOSIS — M5412 Radiculopathy, cervical region: Secondary | ICD-10-CM | POA: Diagnosis not present

## 2023-12-31 DIAGNOSIS — R202 Paresthesia of skin: Secondary | ICD-10-CM

## 2023-12-31 DIAGNOSIS — M5382 Other specified dorsopathies, cervical region: Secondary | ICD-10-CM

## 2023-12-31 MED ORDER — GABAPENTIN 300 MG PO CAPS: 300.0000 mg | ORAL_CAPSULE | Freq: Three times a day (TID) | ORAL | 11 refills | Status: DC | PRN

## 2023-12-31 MED ORDER — LIDOCAINE 5 % EX PTCH: 1.0000 | MEDICATED_PATCH | CUTANEOUS | 3 refills | Status: DC

## 2023-12-31 MED ORDER — METHYLPREDNISOLONE 4 MG PO TBPK: ORAL_TABLET | ORAL | 1 refills | Status: DC

## 2023-12-31 NOTE — Patient Instructions (Addendum)
 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 Please evaluate and treat including dry needling, stretching, strengthening, manual therapy/massage, heating, TENS unit, exercising for scapular stabilization, pectoral stretching and rhomboid strengthening as clinically warranted as well as any other modality as recommended by evaluation. -MRI of the cervical spine, and then referral to Dr. Dartha depending on status(is established at [facility] Dr. Lanis) -More invasive options after MRI cervical spine: Epidural Steroid Injections or medial branch blocks for neck pain and occipital neuralgia in the future as needed. Gamma knife or radiofequency Ablation - these are invasive procedures unlike nerve blocks which are more superficial -Differential: Occipital neuralgia, Cervical radiculopathy or arthritis in the neck or muscle spasms in the neck on the right        Occipital neuralgia  Occipital Neuralgia  Occipital neuralgia is a type of headache that causes brief episodes of very bad pain in the back of the head. Pain from occipital neuralgia may spread (radiate) to other parts of the head. These headaches may be caused by irritation of the nerves that leave the spinal cord high up in the neck, just below the base of the skull (occipital nerves). The occipital nerves transmit sensations from the back of the head, the top of the head, and the areas behind the ears. What are the causes? This condition can occur without any known cause (primary headache syndrome). In other cases, this condition is caused by pressure on or  irritation of one of the two occipital nerves. Pressure and irritation may be due to: Muscle spasm in the neck. Neck injury. Wear and tear of the vertebrae in the neck (osteoarthritis). Disease of the disks that separate the vertebrae. Swollen blood vessels that put pressure on the occipital nerves. Infections. Tumors. Diabetes. What are the signs or symptoms? This condition causes brief burning, stabbing, electric, shocking, or shooting pain in the back of the head that can radiate to the top of the head. It can happen on one side or both sides of the head. It can also cause: Pain behind the eye. Pain triggered by neck movement or hair brushing. Scalp tenderness. Aching in the back of the head between episodes of very bad pain. Pain that gets worse with exposure to bright lights. How is this diagnosed? Your health care provider may diagnose the condition based on a physical exam and your symptoms. Tests may be done, such as: Imaging studies of the brain and neck (cervical spine), such as an MRI or CT scan. These look for causes of pinched nerves. Applying pressure to the nerves in the neck to try to re-create the pain. Injection of numbing medicine into the occipital nerve areas to see if pain goes away (diagnostic nerve block). How is this treated? Treatment for this condition may begin with simple measures, such as: Rest. Massage. Applying heat or cold to the area. Over-the-counter pain relievers. If these measures do not work, you may need other treatments, including: Medicines, such as: Prescription-strength anti-inflammatory medicines. Muscle relaxants. Anti-seizure medicines, which can relieve pain. Antidepressants, which can relieve pain. Injected medicines, such as medicines that numb the area (local anesthetic) and steroids. Pulsed radiofrequency ablation. This is when wires are implanted to deliver  electrical impulses that block pain signals from the occipital  nerve. Surgery to relieve nerve pressure. Physical therapy. Follow these instructions at home: Managing pain     Avoid any activities that cause pain. Rest when you have an attack of pain. Try gentle massage to relieve pain. Try a different pillow or sleeping position. If directed, apply heat to the affected area as often as told by your health care provider. Use the heat source that your health care provider recommends, such as a moist heat pack or a heating pad. Place a towel between your skin and the heat source. Leave the heat on for 20-30 minutes. Remove the heat if your skin turns bright red. This is especially important if you are unable to feel pain, heat, or cold. You have a greater risk of getting burned. If directed, put ice on the back of your head and neck area. To do this: Put ice in a plastic bag. Place a towel between your skin and the bag. Leave the ice on for 20 minutes, 2-3 times a day. Remove the ice if your skin turns bright red. This is very important. If you cannot feel pain, heat, or cold, you have a greater risk of damage to the area. General instructions Take over-the-counter and prescription medicines only as told by your health care provider. Avoid things that make your symptoms worse, such as bright lights. Try to stay active. Get regular exercise that does not cause pain. Ask your health care provider to suggest safe exercises for you. Work with a physical therapist to learn stretching exercises you can do at home. Practice good posture. Keep all follow-up visits. This is important. Contact a health care provider if: Your medicine is not working. You have new or worsening symptoms. Get help right away if: You have very bad head pain that does not go away. You have a sudden change in vision, balance, or speech. These symptoms may represent a serious problem that is an emergency. Do not wait to see if the symptoms will go away. Get medical help right  away. Call your local emergency services (911 in the [place] Do not drive yourself to the hospital. Summary Occipital neuralgia is a type of headache that causes brief episodes of very bad pain in the back of the head. Pain from occipital neuralgia may spread (radiate) to other parts of the head. Treatment for this condition includes rest, massage, and medicines. This information is not intended to replace advice given to you by your health care provider. Make sure you discuss any questions you have with your health care provider. Document Revised: 04/22/2020 Document Reviewed: 04/22/2020 Elsevier Patient Education  2023 [employer]vier Inc.  Gabapentin  Capsules or Tablets What is this medication? GABAPENTIN  (GA ba pen tin) treats nerve pain. It may also be used to prevent and control seizures in people with epilepsy. It works by calming overactive nerves in your body. This medicine may be used for other purposes; ask your health care provider or pharmacist if you have questions. COMMON BRAND NAME(S): Active-PAC with Gabapentin , Gabarone , Gralise , Neurontin  What should I tell my care team before I take this medication? They need to know if you have any of these conditions: Alcohol or substance use disorder Kidney disease Lung or breathing disease Suicidal thoughts, plans, or attempt; a previous suicide attempt by you or a family member An unusual or allergic reaction to gabapentin , other medications, foods, dyes, or preservatives Pregnant or trying to get pregnant Breast-feeding How should I  use this medication? Take this medication by mouth with a glass of water. Follow the directions on the prescription label. You can take it with or without food. If it upsets your stomach, take it with food. Take your medication at regular intervals. Do not take it more often than directed. Do not stop taking except on your care team's advice. If you are directed to break the 600 or 800 mg tablets in half as  part of your dose, the extra half tablet should be used for the next dose. If you have not used the extra half tablet within 28 days, it should be thrown away. A special MedGuide will be given to you by the pharmacist with each prescription and refill. Be sure to read this information carefully each time. Talk to your care team about the use of this medication in children. While this medication may be prescribed for children as young as 3 years for selected conditions, precautions do apply. Overdosage: If you think you have taken too much of this medicine contact a poison control center or emergency room at once. NOTE: This medicine is only for you. Do not share this medicine with others. What if I miss a dose? If you miss a dose, take it as soon as you can. If it is almost time for your next dose, take only that dose. Do not take double or extra doses. What may interact with this medication? Alcohol Antihistamines for allergy, cough, and cold Certain medications for anxiety or sleep Certain medications for depression like amitriptyline, fluoxetine, sertraline Certain medications for seizures like phenobarbital, primidone Certain medications for stomach problems General anesthetics like halothane, isoflurane, methoxyflurane, propofol  Local anesthetics like lidocaine , pramoxine, tetracaine Medications that relax muscles for surgery Opioid medications for pain Phenothiazines like chlorpromazine, mesoridazine, prochlorperazine, thioridazine This list may not describe all possible interactions. Give your health care provider a list of all the medicines, herbs, non-prescription drugs, or dietary supplements you use. Also tell them if you smoke, drink alcohol, or use illegal drugs. Some items may interact with your medicine. What should I watch for while using this medication? Visit your care team for regular checks on your progress. You may want to keep a record at home of how you feel your condition  is responding to treatment. You may want to share this information with your care team at each visit. You should contact your care team if your seizures get worse or if you have any new types of seizures. Do not stop taking this medication or any of your seizure medications unless instructed by your care team. Stopping your medication suddenly can increase your seizures or their severity. This medication may cause serious skin reactions. They can happen weeks to months after starting the medication. Contact your care team right away if you notice fevers or flu-like symptoms with a rash. The rash may be red or purple and then turn into blisters or peeling of the skin. Or, you might notice a red rash with swelling of the face, lips or lymph nodes in your neck or under your arms. Wear a medical identification bracelet or chain if you are taking this medication for seizures. Carry a card that lists all your medications. This medication may affect your coordination, reaction time, or judgment. Do not drive or operate machinery until you know how this medication affects you. Sit up or stand slowly to reduce the risk of dizzy or fainting spells. Drinking alcohol with this medication can increase the risk of  these side effects. Your mouth may get dry. Chewing sugarless gum or sucking hard candy, and drinking plenty of water may help. Watch for new or worsening thoughts of suicide or depression. This includes sudden changes in mood, behaviors, or thoughts. These changes can happen at any time but are more common in the beginning of treatment or after a change in dose. Call your care team right away if you experience these thoughts or worsening depression. If you become pregnant while using this medication, you may enroll in the [place] [employer] Antiepileptic Drug Pregnancy Registry by calling [phone]. This registry collects information about the safety of antiepileptic medication use during pregnancy. What side  effects may I notice from receiving this medication? Side effects that you should report to your care team as soon as possible: Allergic reactions or angioedema--skin rash, itching, hives, swelling of the face, eyes, lips, tongue, arms, or legs, trouble swallowing or breathing Rash, fever, and swollen lymph nodes Thoughts of suicide or self harm, worsening mood, feelings of depression Trouble breathing Unusual changes in mood or behavior in children after use such as difficulty concentrating, hostility, or restlessness Side effects that usually do not require medical attention (report to your care team if they continue or are bothersome): Dizziness Drowsiness Nausea Swelling of ankles, feet, or hands Vomiting This list may not describe all possible side effects. Call your doctor for medical advice about side effects. You may report side effects to FDA at 1-800-FDA-1088. Where should I keep my medication? Keep out of reach of children and pets. Store at room temperature between 15 and 30 degrees C (59 and 86 degrees F). Get rid of any unused medication after the expiration date. This medication may cause accidental overdose and death if taken by other adults, children, or pets. To get rid of medications that are no longer needed or have expired: Take the medication to a medication take-back program. Check with your pharmacy or law enforcement to find a location. If you cannot return the medication, check the label or package insert to see if the medication should be thrown out in the garbage or flushed down the toilet. If you are not sure, ask your care team. If it is safe to put it in the trash, empty the medication out of the container. Mix the medication with cat litter, dirt, coffee grounds, or other unwanted substance. Seal the mixture in a bag or container. Put it in the trash. NOTE: This sheet is a summary. It may not cover all possible information. If you have questions about this  medicine, talk to your doctor, pharmacist, or health care provider.  2023 Elsevier/Gold Standard (2020-12-24 00:00:00) Methylprednisolone  Tablets What is this medication? METHYLPREDNISOLONE  (meth ill pred NISS oh lone) treats many conditions such as asthma, allergic reactions, arthritis, inflammatory bowel diseases, adrenal, and blood or bone marrow disorders. It works by decreasing inflammation, slowing down an overactive immune system, or replacing cortisol normally made in the body. Cortisol is a hormone that plays an important role in how the body responds to stress, illness, and injury. It belongs to a group of medications called steroids. This medicine may be used for other purposes; ask your health care provider or pharmacist if you have questions. COMMON BRAND NAME(S): Medrol , Medrol  Dosepak What should I tell my care team before I take this medication? They need to know if you have any of these conditions: Cushing's syndrome Eye disease, vision problems Diabetes Glaucoma Heart disease High blood pressure Infection especially a viral infection,  such as chickenpox, cold sores, or herpes Liver disease Mental health conditions Myasthenia gravis Osteoporosis Recent or upcoming vaccine Seizures Stomach or intestine problems Thyroid disease An unusual or allergic reaction to lactose, methylprednisolone , other medications, foods, dyes, or preservatives Pregnant or trying to get pregnant Breastfeeding How should I use this medication? Take this medication by mouth with a glass of water. Follow the directions on the prescription label. Take this medication with food. If you are taking this medication once a day, take it in the morning. Do not take it more often than directed. Do not suddenly stop taking your medication because you may develop a severe reaction. Your care team will tell you how much medication to take. If your care team wants you to stop the medication, the dose may be  slowly lowered over time to avoid any side effects. Talk to your care team about the use of this medication in children. Special care may be needed. Overdosage: If you think you have taken too much of this medicine contact a poison control center or emergency room at once. NOTE: This medicine is only for you. Do not share this medicine with others. What if I miss a dose? If you miss a dose, take it as soon as you can. If it is almost time for your next dose, talk to your care team. You may need to miss a dose or take an extra dose. Do not take double or extra doses without advice. What may interact with this medication? Do not take this medication with any of the following: Alefacept Echinacea Live virus vaccines Metyrapone Mifepristone This medication may also interact with the following: Amphotericin B Aspirin and aspirin-like medications Certain antibiotics, such as erythromycin, clarithromycin, troleandomycin Certain medications for diabetes Certain medications for fungal infections, such as ketoconazole Certain medications for seizures, such as carbamazepine, phenobarbital, phenytoin Certain medications that treat or prevent blood clots, such as warfarin Cholestyramine Cyclosporine Digoxin Diuretics Estrogen or progestin hormones Isoniazid NSAIDs, medications for pain and inflammation, such as ibuprofen or naproxen  Other medications for myasthenia gravis Rifampin Vaccines This list may not describe all possible interactions. Give your health care provider a list of all the medicines, herbs, non-prescription drugs, or dietary supplements you use. Also tell them if you smoke, drink alcohol, or use illegal drugs. Some items may interact with your medicine. What should I watch for while using this medication? Tell your care team if your symptoms do not start to get better or if they get worse. Do not stop taking except on your care team's advice. You may develop a severe reaction.  Your care team will tell you how much medication to take. This medication may increase your risk of getting an infection. Tell your care team if you are around anyone with measles or chickenpox, or if you develop sores or blisters that do not heal properly. This medication may increase blood sugar levels. Ask your care team if changes in diet or medications are needed if you have diabetes. Tell your care team right away if you have any change in your eyesight. Using this medication for a long time may increase your risk of low bone mass. Talk to your care team about bone health. What side effects may I notice from receiving this medication? Side effects that you should report to your care team as soon as possible: Allergic reactions--skin rash, itching, hives, swelling of the face, lips, tongue, or throat Cushing syndrome--increased fat around the midsection, upper back, neck, or face,  pink or purple stretch marks on the skin, thinning, fragile skin that easily bruises, unexpected hair growth High blood sugar (hyperglycemia)--increased thirst or amount of urine, unusual weakness or fatigue, blurry vision Increase in blood pressure Infection--fever, chills, cough, sore throat, wounds that don't heal, pain or trouble when passing urine, general feeling of discomfort or being unwell Low adrenal gland function--nausea, vomiting, loss of appetite, unusual weakness or fatigue, dizziness Mood and behavior changes--anxiety, nervousness, confusion, hallucinations, irritability, hostility, thoughts of suicide or self-harm, worsening mood, feelings of depression Stomach bleeding--bloody or black, tar-like stools, vomiting blood or brown material that looks like coffee grounds Swelling of the ankles, hands, or feet Side effects that usually do not require medical attention (report to your care team if they continue or are bothersome): Acne General discomfort and fatigue Headache Increase in  appetite Nausea Trouble sleeping Weight gain This list may not describe all possible side effects. Call your doctor for medical advice about side effects. You may report side effects to FDA at 1-800-FDA-1088. Where should I keep my medication? Keep out of the reach of children and pets. Store at room temperature between 20 and 25 degrees C (68 and 77 degrees F). Throw away any unused medication after the expiration date. NOTE: This sheet is a summary. It may not cover all possible information. If you have questions about this medicine, talk to your doctor, pharmacist, or health care provider.  2023 Elsevier/Gold Standard (2020-09-18 00:00:00) Lidocaine  Patches What is this medication? LIDOCAINE  (LYE doe kane) treats pain. It works by numbing a specific area of the body, which blocks pain signals going to the brain. It belongs to a group of medications called local anesthetics. This medicine may be used for other purposes; ask your health care provider or pharmacist if you have questions. COMMON BRAND NAME(S): Aspercreme with Lidocaine , Blue-Emu, DERMALID, GEN7T, Lidocan, Lidocare, Lidoderm , Lidofore, LidoReal-30, Lidozo, Salonpas Lidocaine , Xyliderm , ZTlido  What should I tell my care team before I take this medication? They need to know if you have any of these conditions: G6PD deficiency Heart disease Kidney disease Liver disease Skin conditions or sensitivity An unusual or allergic reaction to lidocaine , parabens, other medications, foods, dyes, or preservatives Pregnant or trying to get pregnant Breast-feeding How should I use this medication? This medication is for external use only. Use it as directed on the label. Do not apply to burned or damaged skin. Do not use it more often than directed. Keep using it unless your care team tells you to stop. Talk to your care team about the use of this medication in children. Special care may be needed. Overdosage: If you think you have taken  too much of this medicine contact a poison control center or emergency room at once. NOTE: This medicine is only for you. Do not share this medicine with others. What if I miss a dose? If you miss a dose, use it as soon as you can. If it is almost time for your next dose, use only that dose. Do not use double or extra doses. What may interact with this medication? This medication may interact with the following: Acetaminophen  Certain antibiotics, such as dapsone, nitrofurantoin, aminosalicylic acid, sulfasalazine Certain medications for seizures, such as phenobarbital, phenytoin, valproic acid Chloroquine Cyclophosphamide Dofetilide Flutamide Hydroxyurea Ifosfamide Local anesthetics, such as lidocaine , pramoxine, tetracaine MAOIs, such as Carbex, Eldepryl, Marplan, Nardil, and Parnate Metoclopramide  Moricizine Nitroglycerin Primaquine Saquinavir Quinine This list may not describe all possible interactions. Give your health care provider a list of  all the medicines, herbs, non-prescription drugs, or dietary supplements you use. Also tell them if you smoke, drink alcohol, or use illegal drugs. Some items may interact with your medicine. What should I watch for while using this medication? Visit your care team for regular checks on your progress. Tell your care team if your symptoms do not start to get better or if they get worse. Be careful to avoid injury while the area is numb, and you are not aware of pain. If you are going to need surgery, an MRI, CT scan, or other procedure, tell your care team that you are using this medication. You may need to remove the patch before the procedure. What side effects may I notice from receiving this medication? Side effects that you should report to your care team as soon as possible: Allergic reactions--skin rash, itching, hives, swelling of the face, lips, tongue, or throat Headache, unusual weakness or fatigue, shortness of breath, nausea,  vomiting, rapid heartbeat, blue skin or lips, which may be signs of methemoglobinemia Heart rhythm changes--fast or irregular heartbeat, dizziness, feeling faint or lightheaded, chest pain, trouble breathing Side effects that usually do not require medical attention (report to your care team if they continue or are bothersome): Change in skin color Irritation at application site This list may not describe all possible side effects. Call your doctor for medical advice about side effects. You may report side effects to FDA at 1-800-FDA-1088. Where should I keep my medication? Keep out of the reach of children and pets. See product for storage information. Each product may have different instructions. Get rid of any unused medication after the expiration date. To get rid of medications that are no longer needed or have expired: Take the medication to a medication take-back program. Check with your pharmacy or law enforcement to find a location. If you cannot return the medication, ask your pharmacist or care team how to get rid of this medication safely. NOTE: This sheet is a summary. It may not cover all possible information. If you have questions about this medicine, talk to your doctor, pharmacist, or health care provider.  2023 Elsevier/Gold Standard (2021-07-12 00:00:00)    Methylprednisolone  Tablets What is this medication? METHYLPREDNISOLONE  (meth ill pred NISS oh lone) treats many conditions such as asthma, allergic reactions, arthritis, inflammatory bowel diseases, adrenal, and blood or bone marrow disorders. It works by decreasing inflammation, slowing down an overactive immune system, or replacing cortisol normally made in the body. Cortisol is a hormone that plays an important role in how the body responds to stress, illness, and injury. It belongs to a group of medications called steroids. This medicine may be used for other purposes; ask your health care provider or pharmacist if you  have questions. COMMON BRAND NAME(S): Medrol , Medrol  Dosepak What should I tell my care team before I take this medication? They need to know if you have any of these conditions: Cushing's syndrome Eye disease, vision problems Diabetes Glaucoma Heart disease High blood pressure Infection especially a viral infection, such as chickenpox, cold sores, or herpes Liver disease Mental health conditions Myasthenia gravis Osteoporosis Recent or upcoming vaccine Seizures Stomach or intestine problems Thyroid disease An unusual or allergic reaction to lactose, methylprednisolone , other medications, foods, dyes, or preservatives Pregnant or trying to get pregnant Breastfeeding How should I use this medication? Take this medication by mouth with a glass of water. Follow the directions on the prescription label. Take this medication with food. If you are taking this  medication once a day, take it in the morning. Do not take it more often than directed. Do not suddenly stop taking your medication because you may develop a severe reaction. Your care team will tell you how much medication to take. If your care team wants you to stop the medication, the dose may be slowly lowered over time to avoid any side effects. Talk to your care team about the use of this medication in children. Special care may be needed. Overdosage: If you think you have taken too much of this medicine contact a poison control center or emergency room at once. NOTE: This medicine is only for you. Do not share this medicine with others. What if I miss a dose? If you miss a dose, take it as soon as you can. If it is almost time for your next dose, talk to your care team. You may need to miss a dose or take an extra dose. Do not take double or extra doses without advice. What may interact with this medication? Do not take this medication with any of the following: Alefacept Echinacea Live virus  vaccines Metyrapone Mifepristone This medication may also interact with the following: Amphotericin B Aspirin and aspirin-like medications Certain antibiotics, such as erythromycin, clarithromycin, troleandomycin Certain medications for diabetes Certain medications for fungal infections, such as ketoconazole Certain medications for seizures, such as carbamazepine, phenobarbital, phenytoin Certain medications that treat or prevent blood clots, such as warfarin Cholestyramine Cyclosporine Digoxin Diuretics Estrogen or progestin hormones Isoniazid NSAIDs, medications for pain and inflammation, such as ibuprofen or naproxen  Other medications for myasthenia gravis Rifampin Vaccines This list may not describe all possible interactions. Give your health care provider a list of all the medicines, herbs, non-prescription drugs, or dietary supplements you use. Also tell them if you smoke, drink alcohol, or use illegal drugs. Some items may interact with your medicine. What should I watch for while using this medication? Tell your care team if your symptoms do not start to get better or if they get worse. Do not stop taking except on your care team's advice. You may develop a severe reaction. Your care team will tell you how much medication to take. This medication may increase your risk of getting an infection. Tell your care team if you are around anyone with measles or chickenpox, or if you develop sores or blisters that do not heal properly. This medication may increase blood sugar levels. Ask your care team if changes in diet or medications are needed if you have diabetes. Tell your care team right away if you have any change in your eyesight. Using this medication for a long time may increase your risk of low bone mass. Talk to your care team about bone health. What side effects may I notice from receiving this medication? Side effects that you should report to your care team as soon as  possible: Allergic reactions--skin rash, itching, hives, swelling of the face, lips, tongue, or throat Cushing syndrome--increased fat around the midsection, upper back, neck, or face, pink or purple stretch marks on the skin, thinning, fragile skin that easily bruises, unexpected hair growth High blood sugar (hyperglycemia)--increased thirst or amount of urine, unusual weakness or fatigue, blurry vision Increase in blood pressure Infection--fever, chills, cough, sore throat, wounds that don't heal, pain or trouble when passing urine, general feeling of discomfort or being unwell Low adrenal gland function--nausea, vomiting, loss of appetite, unusual weakness or fatigue, dizziness Mood and behavior changes--anxiety, nervousness, confusion, hallucinations, irritability, hostility,  thoughts of suicide or self-harm, worsening mood, feelings of depression Stomach bleeding--bloody or black, tar-like stools, vomiting blood or brown material that looks like coffee grounds Swelling of the ankles, hands, or feet Side effects that usually do not require medical attention (report to your care team if they continue or are bothersome): Acne General discomfort and fatigue Headache Increase in appetite Nausea Trouble sleeping Weight gain This list may not describe all possible side effects. Call your doctor for medical advice about side effects. You may report side effects to FDA at 1-800-FDA-1088. Where should I keep my medication? Keep out of the reach of children and pets. Store at room temperature between 20 and 25 degrees C (68 and 77 degrees F). Throw away any unused medication after the expiration date. NOTE: This sheet is a summary. It may not cover all possible information. If you have questions about this medicine, talk to your doctor, pharmacist, or health care provider.  2024 Elsevier/Gold Standard (2022-02-19 00:00:00)Lidocaine  Patches What is this medication? LIDOCAINE  (LYE doe kane) treats  pain. It works by numbing a specific area of the body, which blocks pain signals going to the brain. It belongs to a group of medications called local anesthetics. This medicine may be used for other purposes; ask your health care provider or pharmacist if you have questions. COMMON BRAND NAME(S): ALOCANE, Aspercreme with Lidocaine , AsperFlex, Blue-Emu, DermacinRx Lidocan, DermacinRx Lidocan II, DermacinRx Lidocan III, DermacinRx Lidocan IV , DermacinRx Lidocan V , DermacinRx Lidocan VI, DermacinRx Lidocan VII, DermacinRx Lidotral , DermacinRx PHN Pak , DERMALID, GEN7T, LidaFlex, Lido King Maximum Strength, Lidocan, LIDOCANNA, Lidocare, Lidoderm , Lidofore, LidoPure, LidoReal-30, Lidotrode, Lidozo, Salonpas Lidocaine , Tridacaine , Ultra Lido, Xyliderm , ZTlido , Zylotrol What should I tell my care team before I take this medication? They need to know if you have any of these conditions: G6PD deficiency Heart disease Kidney disease Liver disease Skin conditions or sensitivity An unusual or allergic reaction to lidocaine , parabens, other medications, foods, dyes, or preservatives Pregnant or trying to get pregnant Breast-feeding How should I use this medication? This medication is for external use only. Use it as directed on the prescription label. Do not apply to burned or damaged skin. Do not use it more often than directed. Keep using it unless your care team tells you to stop. Talk to your care team about the use of this medication in children. Special care may be needed. Overdosage: If you think you have taken too much of this medicine contact a poison control center or emergency room at once. NOTE: This medicine is only for you. Do not share this medicine with others. What if I miss a dose? If you miss a dose, use it as soon as you can. If it is almost time for your next dose, use only that dose. Do not use double or extra doses. What may interact with this medication? This medication may interact  with the following: Acetaminophen  Certain antibiotics, such as dapsone, nitrofurantoin, aminosalicylic acid, sulfasalazine Certain medications for seizures, such as phenobarbital, phenytoin, valproic acid Chloroquine Cyclophosphamide Dofetilide Flutamide Hydroxyurea Ifosfamide Local anesthetics, such as lidocaine , pramoxine, tetracaine MAOIs, such as Carbex, Eldepryl, Marplan, Nardil, and Parnate Metoclopramide  Moricizine Nitroglycerin Primaquine Saquinavir Quinine This list may not describe all possible interactions. Give your health care provider a list of all the medicines, herbs, non-prescription drugs, or dietary supplements you use. Also tell them if you smoke, drink alcohol, or use illegal drugs. Some items may interact with your medicine. What should I watch for while using this medication? Visit your  care team for regular checks on your progress. Tell your care team if your symptoms do not start to get better or if they get worse. Be careful to avoid injury while the area is numb, and you are not aware of pain. If you are going to need surgery, an MRI, CT scan, or other procedure, tell your care team that you are using this medication. You may need to remove the patch before the procedure. What side effects may I notice from receiving this medication? Side effects that you should report to your care team as soon as possible: Allergic reactions--skin rash, itching, hives, swelling of the face, lips, tongue, or throat Headache, unusual weakness or fatigue, shortness of breath, nausea, vomiting, rapid heartbeat, blue skin or lips, which may be signs of methemoglobinemia Heart rhythm changes--fast or irregular heartbeat, dizziness, feeling faint or lightheaded, chest pain, trouble breathing Side effects that usually do not require medical attention (report to your care team if they continue or are bothersome): Change in skin color Irritation at application site This list may not  describe all possible side effects. Call your doctor for medical advice about side effects. You may report side effects to FDA at 1-800-FDA-1088. Where should I keep my medication? Keep out of the reach of children and pets. See product for storage information. Each product may have different instructions. Get rid of any unused medication after the expiration date. To get rid of medications that are no longer needed or have expired: Take the medication to a medication take-back program. Check with your pharmacy or law enforcement to find a location. If you cannot return the medication, ask your pharmacist or care team how to get rid of this medication safely. NOTE: This sheet is a summary. It may not cover all possible information. If you have questions about this medicine, talk to your doctor, pharmacist, or health care provider.  2024 Elsevier/Gold Standard (2022-08-12 00:00:00)Cyclobenzaprine  Tablets What is this medication? CYCLOBENZAPRINE  (sye kloe BEN za preen) treats muscle spasms. It works by relaxing your muscles, which reduces muscle stiffness. It belongs to a group of medications called muscle relaxants. This medicine may be used for other purposes; ask your health care provider or pharmacist if you have questions. COMMON BRAND NAME(S): Fexmid , Flexeril  What should I tell my care team before I take this medication? They need to know if you have any of these conditions: Heart disease, irregular heartbeat, or previous heart attack Liver disease Thyroid problem An unusual or allergic reaction to cyclobenzaprine , tricyclic antidepressants, lactose, other medications, foods, dyes, or preservatives Pregnant or trying to get pregnant Breastfeeding How should I use this medication? Take this medication by mouth with a glass of water. Take it as directed on the prescription label. You can take it with or without food. If it upsets your stomach, take it with food. Do not take it more often  than directed. Talk to your care team about the use of this medication in children. Special care may be needed. Overdosage: If you think you have taken too much of this medicine contact a poison control center or emergency room at once. NOTE: This medicine is only for you. Do not share this medicine with others. What if I miss a dose? If you miss a dose, take it as soon as you can. If it is almost time for your next dose, take only that dose. Do not take double or extra doses. What may interact with this medication? Do not take this medication with  any of the following: MAOIs, such as Carbex, Eldepryl, Marplan, Nardil, and Parnate Opioid medications for cough Safinamide This medication may also interact with the following: Alcohol Bupropion  Antihistamines for allergy, cough, and cold Certain medications for anxiety or sleep Certain medications for bladder problems, such as oxybutynin, tolterodine Certain medications for depression, such as amitriptyline, fluoxetine, sertraline Certain medications for Parkinson disease, such as benztropine, trihexyphenidyl Certain medications for seizures, such as phenobarbital, primidone Certain medications for stomach problems, such as dicyclomine, hyoscyamine Certain medications for travel sickness, such as scopolamine  General anesthetics, such as halothane, isoflurane, methoxyflurane, propofol  Ipratropium Local anesthetics, such as lidocaine , pramoxine, tetracaine Medications that relax muscles for surgery Opioid medications for pain Phenothiazines, such as chlorpromazine, mesoridazine, prochlorperazine, thioridazine Verapamil This list may not describe all possible interactions. Give your health care provider a list of all the medicines, herbs, non-prescription drugs, or dietary supplements you use. Also tell them if you smoke, drink alcohol, or use illegal drugs. Some items may interact with your medicine. What should I watch for while using this  medication? Visit your care team for regular checks on your progress. Tell your care team if your symptoms do not start to get better or if they get worse. This medication may affect your coordination, reaction time, or judgment. Do not drive or operate machinery until you know how this medication affects you. Sit up or stand slowly to reduce the risk of dizzy or fainting spells. Drinking alcohol with this medication can increase the risk of these side effects. Taking this medication with other substances that cause drowsiness, such as alcohol, benzodiazepines, or other opioids can cause serious side effects. Give your care team a list of all medications you use. They will tell you how much medication to take. Do not take more medication than directed. Call emergency services if you have problems breathing or staying awake. Your mouth may get dry. Chewing sugarless gum or sucking hard candy and drinking plenty of water may help. Contact your care team if the problem does not go away or is severe. What side effects may I notice from receiving this medication? Side effects that you should report to your care team as soon as possible: Allergic reactions--skin rash, itching, hives, swelling of the face, lips, tongue, or throat CNS depression--slow or shallow breathing, shortness of breath, feeling faint, dizziness, confusion, trouble staying awake Heart rhythm changes--fast or irregular heartbeat, dizziness, feeling faint or lightheaded, chest pain, trouble breathing Side effects that usually do not require medical attention (report to your care team if they continue or are bothersome): Constipation Dizziness Drowsiness Dry mouth Fatigue Nausea This list may not describe all possible side effects. Call your doctor for medical advice about side effects. You may report side effects to FDA at 1-800-FDA-1088. Where should I keep my medication? Keep out of the reach of children and pets. Store at room  temperature between 20 and 25 degrees C (68 and 77 degrees F). Keep container tightly closed. Get rid of any unused medication after the expiration date. To get rid of medications that are no longer needed or have expired: Take the medication to a medication take-back program. Check with your pharmacy or law enforcement to find a location. If you cannot return the medication, check the label or package insert to see if the medication should be thrown out in the garbage or flushed down the toilet. If you are not sure, ask your care team. If it is safe to put it in the trash, empty  the medication out of the container. Mix the medication with cat litter, dirt, coffee grounds, or other unwanted substance. Seal the mixture in a bag or container. Put it in the trash. NOTE: This sheet is a summary. It may not cover all possible information. If you have questions about this medicine, talk to your doctor, pharmacist, or health care provider.  2024 Elsevier/Gold Standard (2021-12-27 00:00:00)Gabapentin  Capsules or Tablets What is this medication? GABAPENTIN  (GA ba pen tin) treats nerve pain. It may also be used to prevent and control seizures in people with epilepsy. It works by calming overactive nerves in your body. This medicine may be used for other purposes; ask your health care provider or pharmacist if you have questions. COMMON BRAND NAME(S): Active-PAC with Gabapentin , Gabarone , Gralise , Neurontin  What should I tell my care team before I take this medication? They need to know if you have any of these conditions: Kidney disease Lung or breathing disease Substance use disorder Suicidal thoughts, plans, or attempt by you or a family member An unusual or allergic reaction to gabapentin , other medications, foods, dyes, or preservatives Pregnant or trying to get pregnant Breastfeeding How should I use this medication? Take this medication by mouth with a glass of water. Follow the directions on the  prescription label. You can take it with or without food. If it upsets your stomach, take it with food. Take your medication at regular intervals. Do not take it more often than directed. Do not stop taking except on your care team's advice. If you are directed to break the 600 or 800 mg tablets in half as part of your dose, the extra half tablet should be used for the next dose. If you have not used the extra half tablet within 28 days, it should be thrown away. A special MedGuide will be given to you by the pharmacist with each prescription and refill. Be sure to read this information carefully each time. Talk to your care team about the use of this medication in children. While this medication may be prescribed for children as young as 3 years for selected conditions, precautions do apply. Overdosage: If you think you have taken too much of this medicine contact a poison control center or emergency room at once. NOTE: This medicine is only for you. Do not share this medicine with others. What if I miss a dose? If you miss a dose, take it as soon as you can. If it is almost time for your next dose, take only that dose. Do not take double or extra doses. What may interact with this medication? Alcohol Antihistamines for allergy, cough, and cold Certain medications for anxiety or sleep Certain medications for depression like amitriptyline, fluoxetine, sertraline Certain medications for seizures like phenobarbital, primidone Certain medications for stomach problems General anesthetics like halothane, isoflurane, methoxyflurane, propofol  Local anesthetics like lidocaine , pramoxine, tetracaine Medications that relax muscles for surgery Opioid medications for pain Phenothiazines like chlorpromazine, mesoridazine, prochlorperazine, thioridazine This list may not describe all possible interactions. Give your health care provider a list of all the medicines, herbs, non-prescription drugs, or dietary  supplements you use. Also tell them if you smoke, drink alcohol, or use illegal drugs. Some items may interact with your medicine. What should I watch for while using this medication? Visit your care team for regular checks on your progress. You may want to keep a record at home of how you feel your condition is responding to treatment. You may want to share this information with  your care team at each visit. You should contact your care team if your seizures get worse or if you have any new types of seizures. Do not stop taking this medication or any of your seizure medications unless instructed by your care team. Stopping your medication suddenly can increase your seizures or their severity. This medication may cause serious skin reactions. They can happen weeks to months after starting the medication. Contact your care team right away if you notice fevers or flu-like symptoms with a rash. The rash may be red or purple and then turn into blisters or peeling of the skin. Or, you might notice a red rash with swelling of the face, lips or lymph nodes in your neck or under your arms. Wear a medical identification bracelet or chain if you are taking this medication for seizures. Carry a card that lists all your medications. This medication may affect your coordination, reaction time, or judgment. Do not drive or operate machinery until you know how this medication affects you. Sit up or stand slowly to reduce the risk of dizzy or fainting spells. Drinking alcohol with this medication can increase the risk of these side effects. Your mouth may get dry. Chewing sugarless gum or sucking hard candy, and drinking plenty of water may help. Watch for new or worsening thoughts of suicide or depression. This includes sudden changes in mood, behaviors, or thoughts. These changes can happen at any time but are more common in the beginning of treatment or after a change in dose. Call your care team right away if you  experience these thoughts or worsening depression. If you become pregnant while using this medication, you may enroll in the [place] [employer] Antiepileptic Drug Pregnancy Registry by calling [phone]. This registry collects information about the safety of antiepileptic medication use during pregnancy. What side effects may I notice from receiving this medication? Side effects that you should report to your care team as soon as possible: Allergic reactions or angioedema--skin rash, itching, hives, swelling of the face, eyes, lips, tongue, arms, or legs, trouble swallowing or breathing Rash, fever, and swollen lymph nodes Thoughts of suicide or self harm, worsening mood, feelings of depression Trouble breathing Unusual changes in mood or behavior in children after use such as difficulty concentrating, hostility, or restlessness Side effects that usually do not require medical attention (report to your care team if they continue or are bothersome): Dizziness Drowsiness Nausea Swelling of ankles, feet, or hands Vomiting This list may not describe all possible side effects. Call your doctor for medical advice about side effects. You may report side effects to FDA at 1-800-FDA-1088. Where should I keep my medication? Keep out of reach of children and pets. Store at room temperature between 15 and 30 degrees C (59 and 86 degrees F). Get rid of any unused medication after the expiration date. This medication may cause accidental overdose and death if taken by other adults, children, or pets. To get rid of medications that are no longer needed or have expired: Take the medication to a medication take-back program. Check with your pharmacy or law enforcement to find a location. If you cannot return the medication, check the label or package insert to see if the medication should be thrown out in the garbage or flushed down the toilet. If you are not sure, ask your care team. If it is safe to put it  in the trash, empty the medication out of the container. Mix the medication with cat litter, dirt,  coffee grounds, or other unwanted substance. Seal the mixture in a bag or container. Put it in the trash. NOTE: This sheet is a summary. It may not cover all possible information. If you have questions about this medicine, talk to your doctor, pharmacist, or health care provider.  2024 [employer] (2022-04-08 00:00:00)

## 2023-12-31 NOTE — Progress Notes (Signed)
 [facility]    Provider:  Dr Ines Requesting Provider: Elnor Jayson LABOR, DO Primary Care Provider:  Austin Mutton, MD  CC:  right-sided neck and head pain  HPI:  [name] [name] is a 43 y.o. female here as requested by Elnor Jayson LABOR, DO for occipital neuralgia/headache. has Asthma; Menorrhagia; Dysmenorrhea; Dyspareunia; Family history of breast cancer in mother; Unilateral primary osteoarthritis, right knee; Unilateral primary osteoarthritis, left knee; Pelvic pain in female; Pelvic peritoneal adhesions, female; Endometriosis of pelvis; Status post total right knee replacement; Lumbar radiculopathy; Acute medial meniscus tear of left knee; and Synovitis of knee on their problem list.  This is an urgent referral, she presented to the emergency room 2 days ago with ongoing headache for over a week, pain primarily to the back of her neck occipital area on the right side, no pain to her face pain with eye movement, reviewed emergency room note Dr. Elnor from December 29, 2023, she also denied any trauma but reported she manipulates her neck frequently cracks her neck, has not been manipulated by chiropractor, no nausea or vomiting, using heating pad at home, prescribed Fioricet at recent ER visit which has not provided relief.  I reviewed examination which showed pain with movement present in the cervical area, strength was 5 out of 5 equal and symmetric.  She was given lighted Derm patch, Compazine, Benadryl , Decadron  and Toradol  and Norco/Vicodin in the emergency room.  They reported patient was in no acute distress, resting comfortably on the stretcher, neurologically intact.  She has Flexeril  on her medication list as well.  She has pain on the right side of the back of the head. Feels stabbing and sore. She has chronic neck pain. She has decreased range of motion of the neck. Worse with movement. The stabs are better with staying still. No light or sound sensitivity, no nausea. Her  fingertips started tingling on both hands. All the fingers. She hs the pain currently, a stagnant pain. She is trying not to lean back, feels sore, if shetries tomove the neck it hurts, improving per her husband, a hot pack helped, hot pack help, in the ED she couldn;t get comfortable, pain in the right side of the neck, the cocktail helped. Lidocaine  patch helped and gave her some medications in her IV and she is improved. Feels swollen on the right side of the neck and tight. Feels similar to sciatica but in the neck.    Reviewed notes, labs and imaging from outside physicians, which showed:  Reviewed imaging CT head and reports CTA H&N and agree with following:  12/29/2023: IMPRESSION: 1. Normal CTA of the head and neck. No large vessel occlusion, hemodynamically significant stenosis, or other acute vascular abnormality. 2. No other acute intracranial abnormality.    EXAM: CT HEAD WITHOUT CONTRAST   CT CERVICAL SPINE WITHOUT CONTRAST   TECHNIQUE: Multidetector CT imaging of the head and cervical spine was performed following the standard protocol without intravenous contrast. Multiplanar CT image reconstructions of the cervical spine were also generated.   RADIATION DOSE REDUCTION: This exam was performed according to the departmental dose-optimization program which includes automated exposure control, adjustment of the mA and/or kV according to patient size and/or use of iterative reconstruction technique.   COMPARISON:  09/09/2008   FINDINGS: CT HEAD FINDINGS   Brain: No evidence of acute infarction, hemorrhage, hydrocephalus, extra-axial collection or mass lesion/mass effect.   Vascular: No hyperdense vessel or unexpected calcification.   Skull: Normal. Negative for fracture  or focal lesion.   Sinuses/Orbits: No acute finding.   Other: None.   CT CERVICAL SPINE FINDINGS   Alignment: Degenerative straightening of the normal cervical lordosis.   Skull base and  vertebrae: No acute fracture. No primary bone lesion or focal pathologic process.   Soft tissues and spinal canal: No prevertebral fluid or swelling. No visible canal hematoma.   Disc levels: Moderate multilevel cervical disc degenerative disease, worst from C5-C7.   Upper chest: Negative.   Other: None.   IMPRESSION: 1. No acute intracranial pathology. No noncontrast CT findings to explain headache. 2. No fracture or static subluxation of the cervical spine. 3. Moderate multilevel cervical disc degenerative disease, worst from C5-C7. 4. No noncontrast CT evidence of infection in the head or neck. Note that contrast enhanced MRI would be the imaging test of choice for intracranial or cervical infection such as cerebritis, abscess, or discitis/osteomyelitis.   12/29/2023: Cbc showed elevated WBCs 11.5, CMP elevated glucose 169 and creatinine 1.15  Review of Systems: Patient complains of symptoms per HPI as well as the following symptoms adhd. Pertinent negatives and positives per HPI. All others negative.   Social History   Socioeconomic History   Marital status: Divorced    Spouse name: Not on file   Number of children: Not on file   Years of education: Not on file   Highest education level: Not on file  Occupational History   Not on file  Tobacco Use   Smoking status: Never   Smokeless tobacco: Never  Vaping Use   Vaping status: Never Used  Substance and Sexual Activity   Alcohol use: No    Alcohol/week: 0.0 standard drinks of alcohol   Drug use: Yes    Comment: smoke THC   Sexual activity: Not on file  Other Topics Concern   Not on file  Social History Narrative   Pt lives family    Pt in school   Social Drivers of Health   Financial Resource Strain: Not on file  Food Insecurity: Not on file  Transportation Needs: Not on file  Physical Activity: Not on file  Stress: Not on file  Social Connections: Unknown (11/15/2021)   [facility]    Social Network    Social Network: Not on file  Intimate Partner Violence: Unknown (10/09/2021)   Received from [facility]   HITS    Physically Hurt: Not on file    Insult or Talk Down To: Not on file    Threaten Physical Harm: Not on file    Scream or Curse: Not on file    Family History  Problem Relation Age of Onset   Hypertension Mother    Diabetes Mother    Breast cancer Mother 28       IDC; s/p lumpectomy, radiation, and arimidex   Other Sister        both full sisters also have dense breast tissue   Migraines Paternal Aunt    Breast cancer Maternal Grandmother 68       IDC   Colon polyps Maternal Grandmother        approx 4-5 colon polyps   Prostate cancer Paternal Grandfather        dx. 51s-60s   Cancer Cousin 25       paternal 1st cousin dx. w/ either throat or thyroid cancer; not a smoker   Pancreatic cancer Other        maternal great aunt (MGF's sister) d. pancreatic cancer  Cancer Other        maternal great aunt (MGF's sister) dx NOS cancer   Cancer Other        maternal great grandmother (MGF's mother) d. NOS cancer in her 72s   Breast cancer Other        paternal great aunt (PGF's sister) dx. breast cancer, 64s   Colon cancer Neg Hx    Rectal cancer Neg Hx    Stomach cancer Neg Hx    Esophageal cancer Neg Hx     Past Medical History:  Diagnosis Date   Allergy    Arthritis    right knee   Asthma    Constipation    Depression    GERD (gastroesophageal reflux disease)    History of kidney stones    Panic disorder    Pneumonia    Renal disorder    Seasonal allergies    SVD (spontaneous vaginal delivery)    x 3    Patient Active Problem List   Diagnosis Date Noted   Synovitis of knee 05/07/2022   Acute medial meniscus tear of left knee 04/24/2022   Lumbar radiculopathy 01/20/2020   Status post total right knee replacement 08/08/2019   Pelvic pain in female 08/26/2017   Pelvic peritoneal adhesions, female 08/26/2017   Endometriosis of  pelvis 08/26/2017   Unilateral primary osteoarthritis, right knee 06/09/2017   Unilateral primary osteoarthritis, left knee 06/09/2017   Family history of breast cancer in mother 12/06/2015   Menorrhagia 05/31/2013   Dysmenorrhea 05/31/2013   Dyspareunia 05/31/2013   Asthma 08/05/2011    Past Surgical History:  Procedure Laterality Date   BILATERAL SALPINGECTOMY Bilateral 05/31/2013   Procedure: BILATERAL SALPINGECTOMY;  Surgeon: Krystal Deaner, MD;  Location: [facility];  Service: Gynecology;  Laterality: Bilateral;   BREAST CYST ASPIRATION Right 2015   BREAST CYST EXCISION Right    2015   DILATION AND CURETTAGE OF UTERUS     KIDNEY STONE SURGERY     KNEE ARTHROSCOPY Right    KNEE ARTHROSCOPY WITH DRILLING/MICROFRACTURE Right 07/26/2014   Procedure: RIGHT KNEE ARTHROSCOPY WITH CHONDROPLASTY, MICROFRACTURE;  Surgeon: Kay Ozell Cummins, MD;  Location: [facility];  Service: Orthopedics;  Laterality: Right;   KNEE ARTHROSCOPY WITH MEDIAL MENISECTOMY Right 07/26/2014   Procedure: KNEE ARTHROSCOPY WITH MEDIAL MENISECTOMY;  Surgeon: Kay Ozell Cummins, MD;  Location: [facility];  Service: Orthopedics;  Laterality: Right;   KNEE ARTHROSCOPY WITH MEDIAL MENISECTOMY Left 05/07/2022   Procedure: LEFT KNEE ARTHROSCOPY WITH DEBRIDEMENT AND CHONDROPLASTY PARTIAL MEDIAL MENISCECTOMY;  Surgeon: Cummins Kay HERO, MD;  Location: [facility];  Service: Orthopedics;  Laterality: Left;   LAPAROSCOPIC ASSISTED VAGINAL HYSTERECTOMY N/A 05/31/2013   Procedure: LAPAROSCOPIC ASSISTED VAGINAL HYSTERECTOMY;  Surgeon: Krystal Deaner, MD;  Location: [facility];  Service: Gynecology;  Laterality: N/A;   LAPAROSCOPY N/A 08/26/2017   Procedure: LAPAROSCOPY OPERATIVE WITH PERIOTONEAL BIOPSY AND FULGERATION OF ENDOMETRIOSIS;  Surgeon: Deaner Krystal, MD;  Location: [facility];  Service: Gynecology;  Laterality: N/A;   LUMBAR LAMINECTOMY/DECOMPRESSION MICRODISCECTOMY Right 12/31/2018    Procedure: MICRODISCECTOMY LUMBAR 5- SACRAL1;  Surgeon: Lanis Pupa, MD;  Location: [facility];  Service: Neurosurgery;  Laterality: Right;  MICRODISCECTOMY LUMBAR 5- SACRAL1   TONSILLECTOMY     TONSILLECTOMY AND ADENOIDECTOMY     TOTAL KNEE ARTHROPLASTY Right 08/08/2019   Procedure: RIGHT TOTAL KNEE ARTHROPLASTY;  Surgeon: Cummins Kay HERO, MD;  Location: [facility];  Service: Orthopedics;  Laterality: Right;   TUBAL LIGATION  UPPER GI ENDOSCOPY     ulcer    Current Outpatient Medications  Medication Sig Dispense Refill   albuterol  (VENTOLIN  HFA) 108 (90 Base) MCG/ACT inhaler Inhale 2 puffs into the lungs every 6 (six) hours as needed for wheezing or shortness of breath. (Patient taking differently: Inhale 2 puffs into the lungs 2 (two) times daily. Inhale 2 puffs twice daily after using advair doses, may inhale 2 puffs every 6 hours as needed for shortness of breath) 18 g 0   buPROPion  (WELLBUTRIN  XL) 300 MG 24 hr tablet Take 300 mg by mouth at bedtime.     busPIRone  (BUSPAR ) 10 MG tablet Take 10 mg by mouth 3 (three) times daily.     cyclobenzaprine  (FLEXERIL ) 10 MG tablet Take 1 tablet (10 mg total) by mouth 2 (two) times daily as needed for muscle spasms. 20 tablet 0   Fluticasone -Salmeterol (ADVAIR) 500-50 MCG/DOSE AEPB Inhale 1 puff into the lungs daily.      gabapentin  (NEURONTIN ) 300 MG capsule Take 1 capsule (300 mg total) by mouth 3 (three) times daily as needed. 90 capsule 11   HYDROcodone -acetaminophen  (NORCO/VICODIN) 5-325 MG tablet Take 1 tablet by mouth every 4 (four) hours as needed. 10 tablet 0   lidocaine  (LIDODERM ) 5 % Place 1 patch onto the skin daily as needed. Remove & Discard patch within 12 hours or as directed by MD 15 patch 0   lidocaine  (LIDODERM ) 5 % Place 1 patch onto the skin daily. Remove & Discard patch within 12 hours or as directed by MD 30 patch 3   magnesium  oxide (MAG-OX) 400 MG tablet Take 1 tablet (400 mg total) by mouth daily for 7 days. 7 tablet 0    methylPREDNISolone  (MEDROL  DOSEPAK) 4 MG TBPK tablet Take pills daily all together with food. Take the first dose (6 pills) as soon as possible. Take the rest each morning. For 6 days total 6-5-4-3-2-1. 21 tablet 1   pantoprazole  (PROTONIX ) 40 MG tablet Take 40 mg by mouth daily.     No current facility-administered medications for this visit.    Allergies as of 12/31/2023 - Review Complete 12/31/2023  Allergen Reaction Noted   Duloxetine hcl Nausea And Vomiting 12/23/2018   Nitrofurantoin monohyd macro Hives and Shortness Of Breath 07/18/2011   Aspirin  08/26/2017   Morphine and codeine Nausea And Vomiting 05/19/2013    Vitals: BP 118/75   Pulse 84   Ht 5' 2 (1.575 m)   Wt 96 lb 12.8 oz (43.9 kg)   LMP 05/09/2013   BMI 17.70 kg/m  Last Weight:  Wt Readings from Last 1 Encounters:  12/31/23 96 lb 12.8 oz (43.9 kg)   Last Height:   Ht Readings from Last 1 Encounters:  12/31/23 5' 2 (1.575 m)     Physical exam: Exam: Gen: NAD, conversant, well nourised,  well groomed                     CV: RRR, no MRG. No Carotid Bruits. No peripheral edema, warm, nontender Eyes: Conjunctivae clear without exudates or hemorrhage MSK: cervical tightness, pain on palpation especially right side.  Neuro: Detailed Neurologic Exam  Speech:    Speech is normal; fluent and spontaneous with normal comprehension.  Cognition:    The patient is oriented to person, place, and time;     recent and remote memory intact;     language fluent;     normal attention, concentration,     fund of knowledge  Cranial Nerves:    The pupils are equal, round, and reactive to light. The fundi are normal and spontaneous venous pulsations are present. Visual fields are full to finger confrontation. Extraocular movements are intact. Trigeminal sensation is intact and the muscles of mastication are normal. The face is symmetric. The palate elevates in the midline. Hearing intact. Voice is normal. Shoulder shrug is  normal. The tongue has normal motion without fasciculations.   Coordination: nml  Gait: nml  Motor Observation:    No asymmetry, no atrophy, and no involuntary movements noted. Tone:    Normal muscle tone.    Posture:    Posture is normal. normal erect    Strength: right arm proximal weakness otherwise strength is V/V in the upper and lower limbs.      Sensation: intact to LT     Reflex Exam:  DTR's:    Deep tendon reflexes in the upper and lower extremities are symmetrical bilaterally.   Toes:    The toes are downgoing bilaterally.   Clonus:    Clonus is absent.    Assessment/Plan:  Patient with chronic neck pain, decreased range of motion, radicular symptoms, occipita neuralgia on the right   Right arm weakness, numbness and tingling, degenerative changes seen on CT cervical spine, neck pain and radicular symptoms: MRI cervical spine to evaluate for foraminal stenosis/facet dgeneration for interventions such as medial branch blocks or epidural steroid injections or surgical  Options discussed:  - Consider Occipital nerve blocks if needed in the future -Trigger point injections/dry needling - physical therapy -Lidocaine  patches - salonpas -Medrol  Dosepak - daily for 6 days -Gabapentin  prn  and muscle relaxers(ie flexeril  or others), watch for sedation -Heating pad -Physical Therapy: Cervical myofascial pain, forward posture contributing to occipital neuralgia and cervicalgia. Please evaluate and treat including dry needling, stretching, strengthening, manual therapy/massage, heating, TENS unit, exercising for scapular stabilization, pectoral stretching and rhomboid strengthening as clinically warranted as well as any other modality as recommended by evaluation. -MRI of the cervical spine and then referral to Dr. Dartha depending on status(is established at [facility] Dr[facility] Lanis) -More invasive options after MRI cervical spine: Epidural Steroid Injections  or medial branch blocks for neck pain and occipital neuralgia in the future as needed. Gamma knife or radiofequency Ablation - these are invasive procedures unlike nerve blocks which are more superficial -Differential: Occipital neuralgia, Cervical radiculopathy or arthritis in the neck or muscle spasms in the neck on the right        Occipital neuralgia  Occipital Neuralgia  Occipital neuralgia is a type of headache that causes brief episodes of very bad pain in the back of the head. Pain from occipital neuralgia may spread (radiate) to other parts of the head. These headaches may be caused by irritation of the nerves that leave the spinal cord high up in the neck, just below the base of the skull (occipital nerves). The occipital nerves transmit sensations from the back of the head, the top of the head, and the areas behind the ears. What are the causes? This condition can occur without any known cause (primary headache syndrome). In other cases, this condition is caused by pressure on or irritation of one of the two occipital nerves. Pressure and irritation may be due to: Muscle spasm in the neck. Neck injury. Wear and tear of the vertebrae in the neck (osteoarthritis). Disease of the disks that separate the vertebrae. Swollen blood vessels that put pressure on the occipital nerves. Infections. Tumors.  Diabetes. What are the signs or symptoms? This condition causes brief burning, stabbing, electric, shocking, or shooting pain in the back of the head that can radiate to the top of the head. It can happen on one side or both sides of the head. It can also cause: Pain behind the eye. Pain triggered by neck movement or hair brushing. Scalp tenderness. Aching in the back of the head between episodes of very bad pain. Pain that gets worse with exposure to bright lights. How is this diagnosed? Your health care provider may diagnose the condition based on a physical exam and your  symptoms. Tests may be done, such as: Imaging studies of the brain and neck (cervical spine), such as an MRI or CT scan. These look for causes of pinched nerves. Applying pressure to the nerves in the neck to try to re-create the pain. Injection of numbing medicine into the occipital nerve areas to see if pain goes away (diagnostic nerve block). How is this treated? Treatment for this condition may begin with simple measures, such as: Rest. Massage. Applying heat or cold to the area. Over-the-counter pain relievers. If these measures do not work, you may need other treatments, including: Medicines, such as: Prescription-strength anti-inflammatory medicines. Muscle relaxants. Anti-seizure medicines, which can relieve pain. Antidepressants, which can relieve pain. Injected medicines, such as medicines that numb the area (local anesthetic) and steroids. Pulsed radiofrequency ablation. This is when wires are implanted to deliver electrical impulses that block pain signals from the occipital nerve. Surgery to relieve nerve pressure. Physical therapy. Follow these instructions at home: Managing pain     Avoid any activities that cause pain. Rest when you have an attack of pain. Try gentle massage to relieve pain. Try a different pillow or sleeping position. If directed, apply heat to the affected area as often as told by your health care provider. Use the heat source that your health care provider recommends, such as a moist heat pack or a heating pad. Place a towel between your skin and the heat source. Leave the heat on for 20-30 minutes. Remove the heat if your skin turns bright red. This is especially important if you are unable to feel pain, heat, or cold. You have a greater risk of getting burned. If directed, put ice on the back of your head and neck area. To do this: Put ice in a plastic bag. Place a towel between your skin and the bag. Leave the ice on for 20 minutes, 2-3 times  a day. Remove the ice if your skin turns bright red. This is very important. If you cannot feel pain, heat, or cold, you have a greater risk of damage to the area. General instructions Take over-the-counter and prescription medicines only as told by your health care provider. Avoid things that make your symptoms worse, such as bright lights. Try to stay active. Get regular exercise that does not cause pain. Ask your health care provider to suggest safe exercises for you. Work with a physical therapist to learn stretching exercises you can do at home. Practice good posture. Keep all follow-up visits. This is important. Contact a health care provider if: Your medicine is not working. You have new or worsening symptoms. Get help right away if: You have very bad head pain that does not go away. You have a sudden change in vision, balance, or speech. These symptoms may represent a serious problem that is an emergency. Do not wait to see if the symptoms will go  away. Get medical help right away. Call your local emergency services (911 in the [place] Do not drive yourself to the hospital. Summary Occipital neuralgia is a type of headache that causes brief episodes of very bad pain in the back of the head. Pain from occipital neuralgia may spread (radiate) to other parts of the head. Treatment for this condition includes rest, massage, and medicines. This information is not intended to replace advice given to you by your health care provider. Make sure you discuss any questions you have with your health care provider. Document Revised: 04/22/2020 Document Reviewed: 04/22/2020 Elsevier Patient Education  2023 [employer] Inc.  Gabapentin  Capsules or Tablets What is this medication? GABAPENTIN  (GA ba pen tin) treats nerve pain. It may also be used to prevent and control seizures in people with epilepsy. It works by calming overactive nerves in your body. This medicine may be used for other purposes;  ask your health care provider or pharmacist if you have questions. COMMON BRAND NAME(S): Active-PAC with Gabapentin , Gabarone , Gralise , Neurontin  What should I tell my care team before I take this medication? They need to know if you have any of these conditions: Alcohol or substance use disorder Kidney disease Lung or breathing disease Suicidal thoughts, plans, or attempt; a previous suicide attempt by you or a family member An unusual or allergic reaction to gabapentin , other medications, foods, dyes, or preservatives Pregnant or trying to get pregnant Breast-feeding How should I use this medication? Take this medication by mouth with a glass of water. Follow the directions on the prescription label. You can take it with or without food. If it upsets your stomach, take it with food. Take your medication at regular intervals. Do not take it more often than directed. Do not stop taking except on your care team's advice. If you are directed to break the 600 or 800 mg tablets in half as part of your dose, the extra half tablet should be used for the next dose. If you have not used the extra half tablet within 28 days, it should be thrown away. A special MedGuide will be given to you by the pharmacist with each prescription and refill. Be sure to read this information carefully each time. Talk to your care team about the use of this medication in children. While this medication may be prescribed for children as young as 3 years for selected conditions, precautions do apply. Overdosage: If you think you have taken too much of this medicine contact a poison control center or emergency room at once. NOTE: This medicine is only for you. Do not share this medicine with others. What if I miss a dose? If you miss a dose, take it as soon as you can. If it is almost time for your next dose, take only that dose. Do not take double or extra doses. What may interact with this  medication? Alcohol Antihistamines for allergy, cough, and cold Certain medications for anxiety or sleep Certain medications for depression like amitriptyline, fluoxetine, sertraline Certain medications for seizures like phenobarbital, primidone Certain medications for stomach problems General anesthetics like halothane, isoflurane, methoxyflurane, propofol  Local anesthetics like lidocaine , pramoxine, tetracaine Medications that relax muscles for surgery Opioid medications for pain Phenothiazines like chlorpromazine, mesoridazine, prochlorperazine, thioridazine This list may not describe all possible interactions. Give your health care provider a list of all the medicines, herbs, non-prescription drugs, or dietary supplements you use. Also tell them if you smoke, drink alcohol, or use illegal drugs. Some items may interact  with your medicine. What should I watch for while using this medication? Visit your care team for regular checks on your progress. You may want to keep a record at home of how you feel your condition is responding to treatment. You may want to share this information with your care team at each visit. You should contact your care team if your seizures get worse or if you have any new types of seizures. Do not stop taking this medication or any of your seizure medications unless instructed by your care team. Stopping your medication suddenly can increase your seizures or their severity. This medication may cause serious skin reactions. They can happen weeks to months after starting the medication. Contact your care team right away if you notice fevers or flu-like symptoms with a rash. The rash may be red or purple and then turn into blisters or peeling of the skin. Or, you might notice a red rash with swelling of the face, lips or lymph nodes in your neck or under your arms. Wear a medical identification bracelet or chain if you are taking this medication for seizures. Carry a card  that lists all your medications. This medication may affect your coordination, reaction time, or judgment. Do not drive or operate machinery until you know how this medication affects you. Sit up or stand slowly to reduce the risk of dizzy or fainting spells. Drinking alcohol with this medication can increase the risk of these side effects. Your mouth may get dry. Chewing sugarless gum or sucking hard candy, and drinking plenty of water may help. Watch for new or worsening thoughts of suicide or depression. This includes sudden changes in mood, behaviors, or thoughts. These changes can happen at any time but are more common in the beginning of treatment or after a change in dose. Call your care team right away if you experience these thoughts or worsening depression. If you become pregnant while using this medication, you may enroll in the [place] [employer] Antiepileptic Drug Pregnancy Registry by calling ([phone]. This registry collects information about the safety of antiepileptic medication use during pregnancy. What side effects may I notice from receiving this medication? Side effects that you should report to your care team as soon as possible: Allergic reactions or angioedema--skin rash, itching, hives, swelling of the face, eyes, lips, tongue, arms, or legs, trouble swallowing or breathing Rash, fever, and swollen lymph nodes Thoughts of suicide or self harm, worsening mood, feelings of depression Trouble breathing Unusual changes in mood or behavior in children after use such as difficulty concentrating, hostility, or restlessness Side effects that usually do not require medical attention (report to your care team if they continue or are bothersome): Dizziness Drowsiness Nausea Swelling of ankles, feet, or hands Vomiting This list may not describe all possible side effects. Call your doctor for medical advice about side effects. You may report side effects to FDA at  1-800-FDA-1088. Where should I keep my medication? Keep out of reach of children and pets. Store at room temperature between 15 and 30 degrees C (59 and 86 degrees F). Get rid of any unused medication after the expiration date. This medication may cause accidental overdose and death if taken by other adults, children, or pets. To get rid of medications that are no longer needed or have expired: Take the medication to a medication take-back program. Check with your pharmacy or law enforcement to find a location. If you cannot return the medication, check the label or package insert to  see if the medication should be thrown out in the garbage or flushed down the toilet. If you are not sure, ask your care team. If it is safe to put it in the trash, empty the medication out of the container. Mix the medication with cat litter, dirt, coffee grounds, or other unwanted substance. Seal the mixture in a bag or container. Put it in the trash. NOTE: This sheet is a summary. It may not cover all possible information. If you have questions about this medicine, talk to your doctor, pharmacist, or health care provider.  2023 Elsevier/Gold Standard (2020-12-24 00:00:00) Methylprednisolone  Tablets What is this medication? METHYLPREDNISOLONE  (meth ill pred NISS oh lone) treats many conditions such as asthma, allergic reactions, arthritis, inflammatory bowel diseases, adrenal, and blood or bone marrow disorders. It works by decreasing inflammation, slowing down an overactive immune system, or replacing cortisol normally made in the body. Cortisol is a hormone that plays an important role in how the body responds to stress, illness, and injury. It belongs to a group of medications called steroids. This medicine may be used for other purposes; ask your health care provider or pharmacist if you have questions. COMMON BRAND NAME(S): Medrol , Medrol  Dosepak What should I tell my care team before I take this medication? They  need to know if you have any of these conditions: Cushing's syndrome Eye disease, vision problems Diabetes Glaucoma Heart disease High blood pressure Infection especially a viral infection, such as chickenpox, cold sores, or herpes Liver disease Mental health conditions Myasthenia gravis Osteoporosis Recent or upcoming vaccine Seizures Stomach or intestine problems Thyroid disease An unusual or allergic reaction to lactose, methylprednisolone , other medications, foods, dyes, or preservatives Pregnant or trying to get pregnant Breastfeeding How should I use this medication? Take this medication by mouth with a glass of water. Follow the directions on the prescription label. Take this medication with food. If you are taking this medication once a day, take it in the morning. Do not take it more often than directed. Do not suddenly stop taking your medication because you may develop a severe reaction. Your care team will tell you how much medication to take. If your care team wants you to stop the medication, the dose may be slowly lowered over time to avoid any side effects. Talk to your care team about the use of this medication in children. Special care may be needed. Overdosage: If you think you have taken too much of this medicine contact a poison control center or emergency room at once. NOTE: This medicine is only for you. Do not share this medicine with others. What if I miss a dose? If you miss a dose, take it as soon as you can. If it is almost time for your next dose, talk to your care team. You may need to miss a dose or take an extra dose. Do not take double or extra doses without advice. What may interact with this medication? Do not take this medication with any of the following: Alefacept Echinacea Live virus vaccines Metyrapone Mifepristone This medication may also interact with the following: Amphotericin B Aspirin and aspirin-like medications Certain antibiotics,  such as erythromycin, clarithromycin, troleandomycin Certain medications for diabetes Certain medications for fungal infections, such as ketoconazole Certain medications for seizures, such as carbamazepine, phenobarbital, phenytoin Certain medications that treat or prevent blood clots, such as warfarin Cholestyramine Cyclosporine Digoxin Diuretics Estrogen or progestin hormones Isoniazid NSAIDs, medications for pain and inflammation, such as ibuprofen or naproxen  Other medications  for myasthenia gravis Rifampin Vaccines This list may not describe all possible interactions. Give your health care provider a list of all the medicines, herbs, non-prescription drugs, or dietary supplements you use. Also tell them if you smoke, drink alcohol, or use illegal drugs. Some items may interact with your medicine. What should I watch for while using this medication? Tell your care team if your symptoms do not start to get better or if they get worse. Do not stop taking except on your care team's advice. You may develop a severe reaction. Your care team will tell you how much medication to take. This medication may increase your risk of getting an infection. Tell your care team if you are around anyone with measles or chickenpox, or if you develop sores or blisters that do not heal properly. This medication may increase blood sugar levels. Ask your care team if changes in diet or medications are needed if you have diabetes. Tell your care team right away if you have any change in your eyesight. Using this medication for a long time may increase your risk of low bone mass. Talk to your care team about bone health. What side effects may I notice from receiving this medication? Side effects that you should report to your care team as soon as possible: Allergic reactions--skin rash, itching, hives, swelling of the face, lips, tongue, or throat Cushing syndrome--increased fat around the midsection, upper back,  neck, or face, pink or purple stretch marks on the skin, thinning, fragile skin that easily bruises, unexpected hair growth High blood sugar (hyperglycemia)--increased thirst or amount of urine, unusual weakness or fatigue, blurry vision Increase in blood pressure Infection--fever, chills, cough, sore throat, wounds that don't heal, pain or trouble when passing urine, general feeling of discomfort or being unwell Low adrenal gland function--nausea, vomiting, loss of appetite, unusual weakness or fatigue, dizziness Mood and behavior changes--anxiety, nervousness, confusion, hallucinations, irritability, hostility, thoughts of suicide or self-harm, worsening mood, feelings of depression Stomach bleeding--bloody or black, tar-like stools, vomiting blood or brown material that looks like coffee grounds Swelling of the ankles, hands, or feet Side effects that usually do not require medical attention (report to your care team if they continue or are bothersome): Acne General discomfort and fatigue Headache Increase in appetite Nausea Trouble sleeping Weight gain This list may not describe all possible side effects. Call your doctor for medical advice about side effects. You may report side effects to FDA at 1-800-FDA-1088. Where should I keep my medication? Keep out of the reach of children and pets. Store at room temperature between 20 and 25 degrees C (68 and 77 degrees F). Throw away any unused medication after the expiration date. NOTE: This sheet is a summary. It may not cover all possible information. If you have questions about this medicine, talk to your doctor, pharmacist, or health care provider.  2023 Elsevier/Gold Standard (2020-09-18 00:00:00) Lidocaine  Patches What is this medication? LIDOCAINE  (LYE doe kane) treats pain. It works by numbing a specific area of the body, which blocks pain signals going to the brain. It belongs to a group of medications called local anesthetics. This  medicine may be used for other purposes; ask your health care provider or pharmacist if you have questions. COMMON BRAND NAME(S): Aspercreme with Lidocaine , Blue-Emu, DERMALID, GEN7T, Lidocan, Lidocare, Lidoderm , Lidofore, LidoReal-30, Lidozo, Salonpas Lidocaine , Xyliderm , ZTlido  What should I tell my care team before I take this medication? They need to know if you have any of these conditions: G6PD  deficiency Heart disease Kidney disease Liver disease Skin conditions or sensitivity An unusual or allergic reaction to lidocaine , parabens, other medications, foods, dyes, or preservatives Pregnant or trying to get pregnant Breast-feeding How should I use this medication? This medication is for external use only. Use it as directed on the label. Do not apply to burned or damaged skin. Do not use it more often than directed. Keep using it unless your care team tells you to stop. Talk to your care team about the use of this medication in children. Special care may be needed. Overdosage: If you think you have taken too much of this medicine contact a poison control center or emergency room at once. NOTE: This medicine is only for you. Do not share this medicine with others. What if I miss a dose? If you miss a dose, use it as soon as you can. If it is almost time for your next dose, use only that dose. Do not use double or extra doses. What may interact with this medication? This medication may interact with the following: Acetaminophen  Certain antibiotics, such as dapsone, nitrofurantoin, aminosalicylic acid, sulfasalazine Certain medications for seizures, such as phenobarbital, phenytoin, valproic acid Chloroquine Cyclophosphamide Dofetilide Flutamide Hydroxyurea Ifosfamide Local anesthetics, such as lidocaine , pramoxine, tetracaine MAOIs, such as Carbex, Eldepryl, Marplan, Nardil, and Parnate Metoclopramide  Moricizine Nitroglycerin Primaquine Saquinavir Quinine This list may not  describe all possible interactions. Give your health care provider a list of all the medicines, herbs, non-prescription drugs, or dietary supplements you use. Also tell them if you smoke, drink alcohol, or use illegal drugs. Some items may interact with your medicine. What should I watch for while using this medication? Visit your care team for regular checks on your progress. Tell your care team if your symptoms do not start to get better or if they get worse. Be careful to avoid injury while the area is numb, and you are not aware of pain. If you are going to need surgery, an MRI, CT scan, or other procedure, tell your care team that you are using this medication. You may need to remove the patch before the procedure. What side effects may I notice from receiving this medication? Side effects that you should report to your care team as soon as possible: Allergic reactions--skin rash, itching, hives, swelling of the face, lips, tongue, or throat Headache, unusual weakness or fatigue, shortness of breath, nausea, vomiting, rapid heartbeat, blue skin or lips, which may be signs of methemoglobinemia Heart rhythm changes--fast or irregular heartbeat, dizziness, feeling faint or lightheaded, chest pain, trouble breathing Side effects that usually do not require medical attention (report to your care team if they continue or are bothersome): Change in skin color Irritation at application site This list may not describe all possible side effects. Call your doctor for medical advice about side effects. You may report side effects to FDA at 1-800-FDA-1088. Where should I keep my medication? Keep out of the reach of children and pets. See product for storage information. Each product may have different instructions. Get rid of any unused medication after the expiration date. To get rid of medications that are no longer needed or have expired: Take the medication to a medication take-back program. Check with  your pharmacy or law enforcement to find a location. If you cannot return the medication, ask your pharmacist or care team how to get rid of this medication safely. NOTE: This sheet is a summary. It may not cover all possible information. If you  have questions about this medicine, talk to your doctor, pharmacist, or health care provider.  2023 Elsevier/Gold Standard (2021-07-12 00:00:00)     Orders Placed This Encounter  Procedures   MR CERVICAL SPINE WO CONTRAST   Ambulatory referral to Physical Therapy   Meds ordered this encounter  Medications   methylPREDNISolone  (MEDROL  DOSEPAK) 4 MG TBPK tablet    Sig: Take pills daily all together with food. Take the first dose (6 pills) as soon as possible. Take the rest each morning. For 6 days total 6-5-4-3-2-1.    Dispense:  21 tablet    Refill:  1   gabapentin  (NEURONTIN ) 300 MG capsule    Sig: Take 1 capsule (300 mg total) by mouth 3 (three) times daily as needed.    Dispense:  90 capsule    Refill:  11   lidocaine  (LIDODERM ) 5 %    Sig: Place 1 patch onto the skin daily. Remove & Discard patch within 12 hours or as directed by MD    Dispense:  30 patch    Refill:  3    Cc: Elnor Jayson DELENA ROSALEA,  Austin Mutton, MD  Onetha Epp, MD  [facility] [address] [place], [place] [address]  Phone [phone] Fax [phone]

## 2024-01-04 ENCOUNTER — Telehealth: Payer: Self-pay | Admitting: Neurology

## 2024-01-04 NOTE — Telephone Encounter (Signed)
 trillium pending tracking #[id number]

## 2024-01-12 NOTE — Telephone Encounter (Signed)
 Please call patient. I am so sorry she is having such a difficult time, the only way to get an MRI of the cervical spine if she is in severe pain is to proceed to the emergency room, they are the only ones who can proceed with an MRI cervical spine on an emergent basis  if they examine her and order it (that is per the emergency room physician) especially if the pain is worse and she is having balance problems. Unfortunately we have to wait for insurance to approve unless she wants to pay for it out of pocket I am so sorry! The only thing for her to do is proceed to the emergency room if the pain is severe and she is worsening.

## 2024-01-12 NOTE — Telephone Encounter (Signed)
 She left me a voice mail today that she in a lot of pain and having anxiety attacks every day and wanting to get the MRI done. I submitted the PA to her insurance on 6/30 and sent them all of her medical info I have access to (58 pages). It is still pending medical review with their physicians, so there's nothing I can do. The only thing they have available to speed it up is a peer to peer and the phone number is [phone] tracking #[id number]. She asked if she can talk to Dr. Ines to see what can be done. She also said she called the insurance and left the phone number [phone] ref [phone]. I called and they said that the PA is still pending medical review so there's nothing that I can do.

## 2024-01-13 NOTE — Telephone Encounter (Signed)
 Pt is call to request either MD or Nurse give  Pt call back stating she has went to ER  and they didntt do MRI. Pt states  She want to speak to MD . Because PT don't know what to do  and is getting frustrated.

## 2024-01-14 ENCOUNTER — Telehealth: Payer: MEDICAID | Admitting: Neurology

## 2024-01-14 DIAGNOSIS — M542 Cervicalgia: Secondary | ICD-10-CM | POA: Diagnosis not present

## 2024-01-14 DIAGNOSIS — M7918 Myalgia, other site: Secondary | ICD-10-CM | POA: Diagnosis not present

## 2024-01-14 DIAGNOSIS — M5481 Occipital neuralgia: Secondary | ICD-10-CM | POA: Diagnosis not present

## 2024-01-14 MED ORDER — METHOCARBAMOL 750 MG PO TABS
750.0000 mg | ORAL_TABLET | Freq: Three times a day (TID) | ORAL | 6 refills | Status: AC | PRN
Start: 2024-01-14 — End: ?

## 2024-01-14 MED ORDER — PREGABALIN 100 MG PO CAPS: 100.0000 mg | ORAL_CAPSULE | Freq: Two times a day (BID) | ORAL | 6 refills | Status: DC

## 2024-01-14 NOTE — Telephone Encounter (Signed)
 Spoke with patient and got her scheduled for 130 pm today. Pt's questions were answered.

## 2024-01-14 NOTE — Progress Notes (Signed)
 [facility]    Provider:  Dr Ines Requesting Provider: Austin Mutton, MD Primary Care Provider:  Austin Mutton, MD  CC:  right-sided neck and head pain  Virtual Visit via Video Note  I connected with [name] [name] on 01/14/24 at  1:30 PM EDT by a video enabled telemedicine application and verified that I am speaking with the correct person using two identifiers.  Location: Patient: Home Provider: office   I discussed the limitations of evaluation and management by telemedicine and the availability of in person appointments. The patient expressed understanding and agreed to proceed.  History of Present Illness:     I discussed the assessment and treatment plan with the patient. The patient was provided an opportunity to ask questions and all were answered. The patient agreed with the plan and demonstrated an understanding of the instructions.   The patient was advised to call back or seek an in-person evaluation if the symptoms worsen or if the condition fails to improve as anticipated.  I provided 25 minutes of non-face-to-face time during this encounter.   [name], MD   01/14/2024: Patient reports worsening neck pain and occipital neuralgia typical pain. We are awaiting mri cervical spine. During initial visit I ordered an MRI cervical spine(pending insurance), treated her with gabapentin  and a 6-day medrol  dosepak whicle awaiting MRI cervical spine. She had a CT head in the ED which showed no intracranial etiology but did again showed degnerative changes (Moderate multilevel cervical disc degenerative disease, worst from C5-C7.).   Patient complains of symptoms per HPI as well as the following symptoms: nausea due to pain . Pertinent negatives and positives per HPI. All others negative   Still having head pain now also on the left with neck pain. Her head started pounding in the back and top of the head, Not the sharp pains but throbbing more like a  migraine. She felt like she was going to pass out again, she got woozy because of the pain she couldn;t take it and it has been like that ever since, she feels her sugars are shoting high, she gets dizzy, she has noticed her balance is not good and she sways, dizziness, the right side of her neck is not as stabbing since the steroids but she has pulsing and is very stiff and not stiff on the left side of the neck. She can;t turn her head without turning her whole body as it wil crack and pop, neck stiffness, was in the ED and CT head and CTA head and neck were negative except for Moderate multilevel cervical disc degenerative disease, worst from C5-C7.). She start physical therapy on Tuesday, [place] [facility] [address]. Gabapentin  has not helped and in the past it did not help either at higher doses. Taking flexeril  at bedtime. Has a cervical neck pillow.   HPI 6/26/202:  [name] [name] is a 43 y.o. female here as requested by Austin Mutton, MD for occipital neuralgia/headache. has Asthma; Menorrhagia; Dysmenorrhea; Dyspareunia; Family history of breast cancer in mother; Unilateral primary osteoarthritis, right knee; Unilateral primary osteoarthritis, left knee; Pelvic pain in female; Pelvic peritoneal adhesions, female; Endometriosis of pelvis; Status post total right knee replacement; Lumbar radiculopathy; Acute medial meniscus tear of left knee; and Synovitis of knee on their problem list.  This is an urgent referral, she presented to the emergency room 2 days ago with ongoing headache for over a week, pain primarily to the back of her neck occipital area on the  right side, no pain to her face pain with eye movement, reviewed emergency room note Dr. Elnor from December 29, 2023, she also denied any trauma but reported she manipulates her neck frequently cracks her neck, has not been manipulated by chiropractor, no nausea or vomiting, using heating pad at home, prescribed Fioricet at recent ER visit which has not  provided relief.  I reviewed examination which showed pain with movement present in the cervical area, strength was 5 out of 5 equal and symmetric.  She was given lighted Derm patch, Compazine , Benadryl , Decadron  and Toradol  and Norco/Vicodin in the emergency room.  They reported patient was in no acute distress, resting comfortably on the stretcher, neurologically intact.  She has Flexeril  on her medication list as well.  She has pain on the right side of the back of the head. Feels stabbing and sore. She has chronic neck pain. She has decreased range of motion of the neck. Worse with movement. The stabs are better with staying still. No light or sound sensitivity, no nausea. Her fingertips started tingling on both hands. All the fingers. She hs the pain currently, a stagnant pain. She is trying not to lean back, feels sore, if shetries tomove the neck it hurts, improving per her husband, a hot pack helped, hot pack help, in the ED she couldn;t get comfortable, pain in the right side of the neck, the cocktail helped. Lidocaine  patch helped and gave her some medications in her IV and she is improved. Feels swollen on the right side of the neck and tight. Feels similar to sciatica but in the neck.    Reviewed notes, labs and imaging from outside physicians, which showed:  Reviewed imaging CT head and reports CTA H&N and agree with following:  12/29/2023: IMPRESSION: 1. Normal CTA of the head and neck. No large vessel occlusion, hemodynamically significant stenosis, or other acute vascular abnormality. 2. No other acute intracranial abnormality.    EXAM: CT HEAD WITHOUT CONTRAST   CT CERVICAL SPINE WITHOUT CONTRAST   TECHNIQUE: Multidetector CT imaging of the head and cervical spine was performed following the standard protocol without intravenous contrast. Multiplanar CT image reconstructions of the cervical spine were also generated.   RADIATION DOSE REDUCTION: This exam was performed  according to the departmental dose-optimization program which includes automated exposure control, adjustment of the mA and/or kV according to patient size and/or use of iterative reconstruction technique.   COMPARISON:  09/09/2008   FINDINGS: CT HEAD FINDINGS   Brain: No evidence of acute infarction, hemorrhage, hydrocephalus, extra-axial collection or mass lesion/mass effect.   Vascular: No hyperdense vessel or unexpected calcification.   Skull: Normal. Negative for fracture or focal lesion.   Sinuses/Orbits: No acute finding.   Other: None.   CT CERVICAL SPINE FINDINGS   Alignment: Degenerative straightening of the normal cervical lordosis.   Skull base and vertebrae: No acute fracture. No primary bone lesion or focal pathologic process.   Soft tissues and spinal canal: No prevertebral fluid or swelling. No visible canal hematoma.   Disc levels: Moderate multilevel cervical disc degenerative disease, worst from C5-C7.   Upper chest: Negative.   Other: None.   IMPRESSION: 1. No acute intracranial pathology. No noncontrast CT findings to explain headache. 2. No fracture or static subluxation of the cervical spine. 3. Moderate multilevel cervical disc degenerative disease, worst from C5-C7. 4. No noncontrast CT evidence of infection in the head or neck. Note that contrast enhanced MRI would be the imaging test of  choice for intracranial or cervical infection such as cerebritis, abscess, or discitis/osteomyelitis.   12/29/2023: Cbc showed elevated WBCs 11.5, CMP elevated glucose 169 and creatinine 1.15  Review of Systems: Patient complains of symptoms per HPI as well as the following symptoms adhd. Pertinent negatives and positives per HPI. All others negative.   Social History   Socioeconomic History   Marital status: Divorced    Spouse name: Not on file   Number of children: Not on file   Years of education: Not on file   Highest education level: Not on  file  Occupational History   Not on file  Tobacco Use   Smoking status: Never   Smokeless tobacco: Never  Vaping Use   Vaping status: Never Used  Substance and Sexual Activity   Alcohol use: No    Alcohol/week: 0.0 standard drinks of alcohol   Drug use: Yes    Comment: smoke THC   Sexual activity: Not on file  Other Topics Concern   Not on file  Social History Narrative   Pt lives family    Pt in school   Social Drivers of Health   Financial Resource Strain: Not on file  Food Insecurity: Not on file  Transportation Needs: Not on file  Physical Activity: Not on file  Stress: Not on file  Social Connections: Unknown (11/15/2021)   Received from [facility]   Social Network    Social Network: Not on file  Intimate Partner Violence: Unknown (10/09/2021)   Received from [facility]   HITS    Physically Hurt: Not on file    Insult or Talk Down To: Not on file    Threaten Physical Harm: Not on file    Scream or Curse: Not on file    Family History  Problem Relation Age of Onset   Hypertension Mother    Diabetes Mother    Breast cancer Mother 42       IDC; s/p lumpectomy, radiation, and arimidex   Other Sister        both full sisters also have dense breast tissue   Migraines Paternal Aunt    Breast cancer Maternal Grandmother 44       IDC   Colon polyps Maternal Grandmother        approx 4-5 colon polyps   Prostate cancer Paternal Grandfather        dx. 61s-60s   Cancer Cousin 25       paternal 1st cousin dx. w/ either throat or thyroid cancer; not a smoker   Pancreatic cancer Other        maternal great aunt (MGF's sister) d. pancreatic cancer   Cancer Other        maternal great aunt (MGF's sister) dx NOS cancer   Cancer Other        maternal great grandmother (MGF's mother) d. NOS cancer in her 78s   Breast cancer Other        paternal great aunt (PGF's sister) dx. breast cancer, 30s   Colon cancer Neg Hx    Rectal cancer Neg Hx    Stomach cancer Neg Hx     Esophageal cancer Neg Hx     Past Medical History:  Diagnosis Date   Allergy    Arthritis    right knee   Asthma    Constipation    Depression    GERD (gastroesophageal reflux disease)    History of kidney stones    Panic disorder  Pneumonia    Renal disorder    Seasonal allergies    SVD (spontaneous vaginal delivery)    x 3    Patient Active Problem List   Diagnosis Date Noted   Synovitis of knee 05/07/2022   Acute medial meniscus tear of left knee 04/24/2022   Lumbar radiculopathy 01/20/2020   Status post total right knee replacement 08/08/2019   Pelvic pain in female 08/26/2017   Pelvic peritoneal adhesions, female 08/26/2017   Endometriosis of pelvis 08/26/2017   Unilateral primary osteoarthritis, right knee 06/09/2017   Unilateral primary osteoarthritis, left knee 06/09/2017   Family history of breast cancer in mother 12/06/2015   Menorrhagia 05/31/2013   Dysmenorrhea 05/31/2013   Dyspareunia 05/31/2013   Asthma 08/05/2011    Past Surgical History:  Procedure Laterality Date   BILATERAL SALPINGECTOMY Bilateral 05/31/2013   Procedure: BILATERAL SALPINGECTOMY;  Surgeon: Krystal Deaner, MD;  Location: [facility];  Service: Gynecology;  Laterality: Bilateral;   BREAST CYST ASPIRATION Right 2015   BREAST CYST EXCISION Right    2015   DILATION AND CURETTAGE OF UTERUS     KIDNEY STONE SURGERY     KNEE ARTHROSCOPY Right    KNEE ARTHROSCOPY WITH DRILLING/MICROFRACTURE Right 07/26/2014   Procedure: RIGHT KNEE ARTHROSCOPY WITH CHONDROPLASTY, MICROFRACTURE;  Surgeon: Kay Ozell Cummins, MD;  Location: [facility];  Service: Orthopedics;  Laterality: Right;   KNEE ARTHROSCOPY WITH MEDIAL MENISECTOMY Right 07/26/2014   Procedure: KNEE ARTHROSCOPY WITH MEDIAL MENISECTOMY;  Surgeon: Kay Ozell Cummins, MD;  Location: [facility];  Service: Orthopedics;  Laterality: Right;   KNEE ARTHROSCOPY WITH MEDIAL MENISECTOMY Left 05/07/2022   Procedure:  LEFT KNEE ARTHROSCOPY WITH DEBRIDEMENT AND CHONDROPLASTY PARTIAL MEDIAL MENISCECTOMY;  Surgeon: Cummins Kay HERO, MD;  Location: [facility];  Service: Orthopedics;  Laterality: Left;   LAPAROSCOPIC ASSISTED VAGINAL HYSTERECTOMY N/A 05/31/2013   Procedure: LAPAROSCOPIC ASSISTED VAGINAL HYSTERECTOMY;  Surgeon: Krystal Deaner, MD;  Location: [facility];  Service: Gynecology;  Laterality: N/A;   LAPAROSCOPY N/A 08/26/2017   Procedure: LAPAROSCOPY OPERATIVE WITH PERIOTONEAL BIOPSY AND FULGERATION OF ENDOMETRIOSIS;  Surgeon: Deaner Krystal, MD;  Location: [facility];  Service: Gynecology;  Laterality: N/A;   LUMBAR LAMINECTOMY/DECOMPRESSION MICRODISCECTOMY Right 12/31/2018   Procedure: MICRODISCECTOMY LUMBAR 5- SACRAL1;  Surgeon: Lanis Pupa, MD;  Location: [facility];  Service: Neurosurgery;  Laterality: Right;  MICRODISCECTOMY LUMBAR 5- SACRAL1   TONSILLECTOMY     TONSILLECTOMY AND ADENOIDECTOMY     TOTAL KNEE ARTHROPLASTY Right 08/08/2019   Procedure: RIGHT TOTAL KNEE ARTHROPLASTY;  Surgeon: Cummins Kay HERO, MD;  Location: [facility];  Service: Orthopedics;  Laterality: Right;   TUBAL LIGATION     UPPER GI ENDOSCOPY     ulcer    Current Outpatient Medications  Medication Sig Dispense Refill   methocarbamol  (ROBAXIN ) 750 MG tablet Take 1 tablet (750 mg total) by mouth every 8 (eight) hours as needed for muscle spasms. 90 tablet 6   pregabalin  (LYRICA ) 100 MG capsule Take 1 capsule (100 mg total) by mouth 2 (two) times daily. 60 capsule 6   albuterol  (VENTOLIN  HFA) 108 (90 Base) MCG/ACT inhaler Inhale 2 puffs into the lungs every 6 (six) hours as needed for wheezing or shortness of breath. (Patient taking differently: Inhale 2 puffs into the lungs 2 (two) times daily. Inhale 2 puffs twice daily after using advair doses, may inhale 2 puffs every 6 hours as needed for shortness of breath) 18 g 0   buPROPion  (WELLBUTRIN  XL) 300 MG  24 hr tablet Take 300 mg by mouth at bedtime.     busPIRone  (BUSPAR ) 10  MG tablet Take 10 mg by mouth 3 (three) times daily.     cyclobenzaprine  (FLEXERIL ) 10 MG tablet Take 1 tablet (10 mg total) by mouth 2 (two) times daily as needed for muscle spasms. 20 tablet 0   Fluticasone -Salmeterol (ADVAIR) 500-50 MCG/DOSE AEPB Inhale 1 puff into the lungs daily.      HYDROcodone -acetaminophen  (NORCO/VICODIN) 5-325 MG tablet Take 1 tablet by mouth every 4 (four) hours as needed. 10 tablet 0   lidocaine  (LIDODERM ) 5 % Place 1 patch onto the skin daily as needed. Remove & Discard patch within 12 hours or as directed by MD 15 patch 0   lidocaine  (LIDODERM ) 5 % Place 1 patch onto the skin daily. Remove & Discard patch within 12 hours or as directed by MD 30 patch 3   methylPREDNISolone  (MEDROL  DOSEPAK) 4 MG TBPK tablet Take pills daily all together with food. Take the first dose (6 pills) as soon as possible. Take the rest each morning. For 6 days total 6-5-4-3-2-1. 21 tablet 1   pantoprazole  (PROTONIX ) 40 MG tablet Take 40 mg by mouth daily.     No current facility-administered medications for this visit.    Allergies as of 01/14/2024 - Review Complete 12/31/2023  Allergen Reaction Noted   Duloxetine hcl Nausea And Vomiting 12/23/2018   Nitrofurantoin monohyd macro Hives and Shortness Of Breath 07/18/2011   Aspirin  08/26/2017   Morphine and codeine Nausea And Vomiting 05/19/2013    Vitals: LMP 05/09/2013  Last Weight:  Wt Readings from Last 1 Encounters:  12/31/23 96 lb 12.8 oz (43.9 kg)   Last Height:   Ht Readings from Last 1 Encounters:  12/31/23 5' 2 (1.575 m)    Physical exam: Exam: Gen: NAD, conversant      CV: No palpitations or chest pain or SOB. VS: Breathing at a normal rate. Weight appears within normal limits. Not febrile. Eyes: Conjunctivae clear without exudates or hemorrhage  Neuro: Detailed Neurologic Exam  Speech:    Speech is normal; fluent and spontaneous with normal comprehension.  Cognition:    The patient is oriented to person,  place, and time;     recent and remote memory intact;     language fluent;     normal attention, concentration, fund of knowledge Cranial Nerves:    The pupils are equal, round, and reactive to light. Visual fields are full Extraocular movements are intact.  The face is symmetric with normal sensation. The palate elevates in the midline. Hearing intact. Voice is normal. Shoulder shrug is normal. The tongue has normal motion without fasciculations.   Coordination: normal  Gait:    No abnormalities noted or reported  Motor Observation:   no involuntary movements noted. Tone:    Appears normal  Posture:    Posture is normal. normal erect    Strength:    Strength is anti-gravity and symmetric in the upper and lower limbs.      Sensation: intact to LT, no reports of numbness or tingling or paresthesias         Assessment/Plan:  Patient with chronic neck pain, decreased range of motion, radicular symptoms, occipita neuralgia on the right  01/14/2024: Patient with worsening neck stiffness/cervical muscle pain and occipital pain; Patient reports worsening neck pain and occipital neuralgia typical pain. We are awaiting mri cervical spine. During initial visit I ordered an MRI cervical spine(pending insurance), treated her  with gabapentin  and a 6-day medrol  dosepak whicle awaiting MRI cervical spine. She had a CT head in the ED which showed no intracranial etiology but did again showed degnerative changes (Moderate multilevel cervical disc degenerative disease, worst from C5-C7.). Stop gabapentin  Start Lyrica  Start Robaxin  during the day(can take the flexeril  at night) Starting PT at [facility] this week(ask about the dry needling) Dr. Murl for occipital nerve blocks : Please consider providing occipital nerve blocks and cervical trigger point injections and any treatment for cervical myofascial pain and occipital neuralgia per clinical judgement.    12/31/2023: Right arm  weakness, numbness and tingling, degenerative changes seen on CT cervical spine, neck pain and radicular symptoms: MRI cervical spine to evaluate for foraminal stenosis/facet dgeneration for interventions such as medial branch blocks or epidural steroid injections or surgical  Options discussed:  - Consider Occipital nerve blocks if needed in the future -Trigger point injections/dry needling - physical therapy -Lidocaine  patches - salonpas -Medrol  Dosepak - daily for 6 days -Gabapentin  prn  and muscle relaxers(ie flexeril  or others), watch for sedation -Heating pad -Physical Therapy: Cervical myofascial pain, forward posture contributing to occipital neuralgia and cervicalgia. Please evaluate and treat including dry needling, stretching, strengthening, manual therapy/massage, heating, TENS unit, exercising for scapular stabilization, pectoral stretching and rhomboid strengthening as clinically warranted as well as any other modality as recommended by evaluation. -MRI of the cervical spine and then referral to Dr. Dartha depending on status(is established at [facility] Dr. Lanis) -More invasive options after MRI cervical spine: Epidural Steroid Injections or medial branch blocks for neck pain and occipital neuralgia in the future as needed. Gamma knife or radiofequency Ablation - these are invasive procedures unlike nerve blocks which are more superficial -Differential: Occipital neuralgia, Cervical radiculopathy or arthritis in the neck or muscle spasms in the neck on the right        Occipital neuralgia  Occipital Neuralgia  Occipital neuralgia is a type of headache that causes brief episodes of very bad pain in the back of the head. Pain from occipital neuralgia may spread (radiate) to other parts of the head. These headaches may be caused by irritation of the nerves that leave the spinal cord high up in the neck, just below the base of the skull (occipital nerves). The  occipital nerves transmit sensations from the back of the head, the top of the head, and the areas behind the ears. What are the causes? This condition can occur without any known cause (primary headache syndrome). In other cases, this condition is caused by pressure on or irritation of one of the two occipital nerves. Pressure and irritation may be due to: Muscle spasm in the neck. Neck injury. Wear and tear of the vertebrae in the neck (osteoarthritis). Disease of the disks that separate the vertebrae. Swollen blood vessels that put pressure on the occipital nerves. Infections. Tumors. Diabetes. What are the signs or symptoms? This condition causes brief burning, stabbing, electric, shocking, or shooting pain in the back of the head that can radiate to the top of the head. It can happen on one side or both sides of the head. It can also cause: Pain behind the eye. Pain triggered by neck movement or hair brushing. Scalp tenderness. Aching in the back of the head between episodes of very bad pain. Pain that gets worse with exposure to bright lights. How is this diagnosed? Your health care provider may diagnose the condition based on a physical exam and your symptoms. Tests may  be done, such as: Imaging studies of the brain and neck (cervical spine), such as an MRI or CT scan. These look for causes of pinched nerves. Applying pressure to the nerves in the neck to try to re-create the pain. Injection of numbing medicine into the occipital nerve areas to see if pain goes away (diagnostic nerve block). How is this treated? Treatment for this condition may begin with simple measures, such as: Rest. Massage. Applying heat or cold to the area. Over-the-counter pain relievers. If these measures do not work, you may need other treatments, including: Medicines, such as: Prescription-strength anti-inflammatory medicines. Muscle relaxants. Anti-seizure medicines, which can relieve  pain. Antidepressants, which can relieve pain. Injected medicines, such as medicines that numb the area (local anesthetic) and steroids. Pulsed radiofrequency ablation. This is when wires are implanted to deliver electrical impulses that block pain signals from the occipital nerve. Surgery to relieve nerve pressure. Physical therapy. Follow these instructions at home: Managing pain     Avoid any activities that cause pain. Rest when you have an attack of pain. Try gentle massage to relieve pain. Try a different pillow or sleeping position. If directed, apply heat to the affected area as often as told by your health care provider. Use the heat source that your health care provider recommends, such as a moist heat pack or a heating pad. Place a towel between your skin and the heat source. Leave the heat on for 20-30 minutes. Remove the heat if your skin turns bright red. This is especially important if you are unable to feel pain, heat, or cold. You have a greater risk of getting burned. If directed, put ice on the back of your head and neck area. To do this: Put ice in a plastic bag. Place a towel between your skin and the bag. Leave the ice on for 20 minutes, 2-3 times a day. Remove the ice if your skin turns bright red. This is very important. If you cannot feel pain, heat, or cold, you have a greater risk of damage to the area. General instructions Take over-the-counter and prescription medicines only as told by your health care provider. Avoid things that make your symptoms worse, such as bright lights. Try to stay active. Get regular exercise that does not cause pain. Ask your health care provider to suggest safe exercises for you. Work with a physical therapist to learn stretching exercises you can do at home. Practice good posture. Keep all follow-up visits. This is important. Contact a health care provider if: Your medicine is not working. You have new or worsening  symptoms. Get help right away if: You have very bad head pain that does not go away. You have a sudden change in vision, balance, or speech. These symptoms may represent a serious problem that is an emergency. Do not wait to see if the symptoms will go away. Get medical help right away. Call your local emergency services (911 in the [place] Do not drive yourself to the hospital. Summary Occipital neuralgia is a type of headache that causes brief episodes of very bad pain in the back of the head. Pain from occipital neuralgia may spread (radiate) to other parts of the head. Treatment for this condition includes rest, massage, and medicines. This information is not intended to replace advice given to you by your health care provider. Make sure you discuss any questions you have with your health care provider. Document Revised: 04/22/2020 Document Reviewed: 04/22/2020 Elsevier Patient Education  2023 [employer]  Gabapentin  Capsules or Tablets What is this medication? GABAPENTIN  (GA ba pen tin) treats nerve pain. It may also be used to prevent and control seizures in people with epilepsy. It works by calming overactive nerves in your body. This medicine may be used for other purposes; ask your health care provider or pharmacist if you have questions. COMMON BRAND NAME(S): Active-PAC with Gabapentin , Gabarone , Gralise , Neurontin  What should I tell my care team before I take this medication? They need to know if you have any of these conditions: Alcohol or substance use disorder Kidney disease Lung or breathing disease Suicidal thoughts, plans, or attempt; a previous suicide attempt by you or a family member An unusual or allergic reaction to gabapentin , other medications, foods, dyes, or preservatives Pregnant or trying to get pregnant Breast-feeding How should I use this medication? Take this medication by mouth with a glass of water. Follow the directions on the prescription label. You  can take it with or without food. If it upsets your stomach, take it with food. Take your medication at regular intervals. Do not take it more often than directed. Do not stop taking except on your care team's advice. If you are directed to break the 600 or 800 mg tablets in half as part of your dose, the extra half tablet should be used for the next dose. If you have not used the extra half tablet within 28 days, it should be thrown away. A special MedGuide will be given to you by the pharmacist with each prescription and refill. Be sure to read this information carefully each time. Talk to your care team about the use of this medication in children. While this medication may be prescribed for children as young as 3 years for selected conditions, precautions do apply. Overdosage: If you think you have taken too much of this medicine contact a poison control center or emergency room at once. NOTE: This medicine is only for you. Do not share this medicine with others. What if I miss a dose? If you miss a dose, take it as soon as you can. If it is almost time for your next dose, take only that dose. Do not take double or extra doses. What may interact with this medication? Alcohol Antihistamines for allergy, cough, and cold Certain medications for anxiety or sleep Certain medications for depression like amitriptyline, fluoxetine, sertraline Certain medications for seizures like phenobarbital, primidone Certain medications for stomach problems General anesthetics like halothane, isoflurane, methoxyflurane, propofol  Local anesthetics like lidocaine , pramoxine, tetracaine Medications that relax muscles for surgery Opioid medications for pain Phenothiazines like chlorpromazine, mesoridazine, prochlorperazine , thioridazine This list may not describe all possible interactions. Give your health care provider a list of all the medicines, herbs, non-prescription drugs, or dietary supplements you use. Also  tell them if you smoke, drink alcohol, or use illegal drugs. Some items may interact with your medicine. What should I watch for while using this medication? Visit your care team for regular checks on your progress. You may want to keep a record at home of how you feel your condition is responding to treatment. You may want to share this information with your care team at each visit. You should contact your care team if your seizures get worse or if you have any new types of seizures. Do not stop taking this medication or any of your seizure medications unless instructed by your care team. Stopping your medication suddenly can increase your seizures or their severity. This medication may  cause serious skin reactions. They can happen weeks to months after starting the medication. Contact your care team right away if you notice fevers or flu-like symptoms with a rash. The rash may be red or purple and then turn into blisters or peeling of the skin. Or, you might notice a red rash with swelling of the face, lips or lymph nodes in your neck or under your arms. Wear a medical identification bracelet or chain if you are taking this medication for seizures. Carry a card that lists all your medications. This medication may affect your coordination, reaction time, or judgment. Do not drive or operate machinery until you know how this medication affects you. Sit up or stand slowly to reduce the risk of dizzy or fainting spells. Drinking alcohol with this medication can increase the risk of these side effects. Your mouth may get dry. Chewing sugarless gum or sucking hard candy, and drinking plenty of water may help. Watch for new or worsening thoughts of suicide or depression. This includes sudden changes in mood, behaviors, or thoughts. These changes can happen at any time but are more common in the beginning of treatment or after a change in dose. Call your care team right away if you experience these thoughts or  worsening depression. If you become pregnant while using this medication, you may enroll in the [place] [employer] Antiepileptic Drug Pregnancy Registry by calling [phone]. This registry collects information about the safety of antiepileptic medication use during pregnancy. What side effects may I notice from receiving this medication? Side effects that you should report to your care team as soon as possible: Allergic reactions or angioedema--skin rash, itching, hives, swelling of the face, eyes, lips, tongue, arms, or legs, trouble swallowing or breathing Rash, fever, and swollen lymph nodes Thoughts of suicide or self harm, worsening mood, feelings of depression Trouble breathing Unusual changes in mood or behavior in children after use such as difficulty concentrating, hostility, or restlessness Side effects that usually do not require medical attention (report to your care team if they continue or are bothersome): Dizziness Drowsiness Nausea Swelling of ankles, feet, or hands Vomiting This list may not describe all possible side effects. Call your doctor for medical advice about side effects. You may report side effects to FDA at 1-800-FDA-1088. Where should I keep my medication? Keep out of reach of children and pets. Store at room temperature between 15 and 30 degrees C (59 and 86 degrees F). Get rid of any unused medication after the expiration date. This medication may cause accidental overdose and death if taken by other adults, children, or pets. To get rid of medications that are no longer needed or have expired: Take the medication to a medication take-back program. Check with your pharmacy or law enforcement to find a location. If you cannot return the medication, check the label or package insert to see if the medication should be thrown out in the garbage or flushed down the toilet. If you are not sure, ask your care team. If it is safe to put it in the trash, empty the  medication out of the container. Mix the medication with cat litter, dirt, coffee grounds, or other unwanted substance. Seal the mixture in a bag or container. Put it in the trash. NOTE: This sheet is a summary. It may not cover all possible information. If you have questions about this medicine, talk to your doctor, pharmacist, or health care provider.  2023 Elsevier/Gold Standard (2020-12-24 00:00:00) Methylprednisolone  Tablets What is  this medication? METHYLPREDNISOLONE  (meth ill pred NISS oh lone) treats many conditions such as asthma, allergic reactions, arthritis, inflammatory bowel diseases, adrenal, and blood or bone marrow disorders. It works by decreasing inflammation, slowing down an overactive immune system, or replacing cortisol normally made in the body. Cortisol is a hormone that plays an important role in how the body responds to stress, illness, and injury. It belongs to a group of medications called steroids. This medicine may be used for other purposes; ask your health care provider or pharmacist if you have questions. COMMON BRAND NAME(S): Medrol , Medrol  Dosepak What should I tell my care team before I take this medication? They need to know if you have any of these conditions: Cushing's syndrome Eye disease, vision problems Diabetes Glaucoma Heart disease High blood pressure Infection especially a viral infection, such as chickenpox, cold sores, or herpes Liver disease Mental health conditions Myasthenia gravis Osteoporosis Recent or upcoming vaccine Seizures Stomach or intestine problems Thyroid disease An unusual or allergic reaction to lactose, methylprednisolone , other medications, foods, dyes, or preservatives Pregnant or trying to get pregnant Breastfeeding How should I use this medication? Take this medication by mouth with a glass of water. Follow the directions on the prescription label. Take this medication with food. If you are taking this medication once  a day, take it in the morning. Do not take it more often than directed. Do not suddenly stop taking your medication because you may develop a severe reaction. Your care team will tell you how much medication to take. If your care team wants you to stop the medication, the dose may be slowly lowered over time to avoid any side effects. Talk to your care team about the use of this medication in children. Special care may be needed. Overdosage: If you think you have taken too much of this medicine contact a poison control center or emergency room at once. NOTE: This medicine is only for you. Do not share this medicine with others. What if I miss a dose? If you miss a dose, take it as soon as you can. If it is almost time for your next dose, talk to your care team. You may need to miss a dose or take an extra dose. Do not take double or extra doses without advice. What may interact with this medication? Do not take this medication with any of the following: Alefacept Echinacea Live virus vaccines Metyrapone Mifepristone This medication may also interact with the following: Amphotericin B Aspirin and aspirin-like medications Certain antibiotics, such as erythromycin, clarithromycin, troleandomycin Certain medications for diabetes Certain medications for fungal infections, such as ketoconazole Certain medications for seizures, such as carbamazepine, phenobarbital, phenytoin Certain medications that treat or prevent blood clots, such as warfarin Cholestyramine Cyclosporine Digoxin Diuretics Estrogen or progestin hormones Isoniazid NSAIDs, medications for pain and inflammation, such as ibuprofen or naproxen  Other medications for myasthenia gravis Rifampin Vaccines This list may not describe all possible interactions. Give your health care provider a list of all the medicines, herbs, non-prescription drugs, or dietary supplements you use. Also tell them if you smoke, drink alcohol, or use  illegal drugs. Some items may interact with your medicine. What should I watch for while using this medication? Tell your care team if your symptoms do not start to get better or if they get worse. Do not stop taking except on your care team's advice. You may develop a severe reaction. Your care team will tell you how much medication to take. This medication  may increase your risk of getting an infection. Tell your care team if you are around anyone with measles or chickenpox, or if you develop sores or blisters that do not heal properly. This medication may increase blood sugar levels. Ask your care team if changes in diet or medications are needed if you have diabetes. Tell your care team right away if you have any change in your eyesight. Using this medication for a long time may increase your risk of low bone mass. Talk to your care team about bone health. What side effects may I notice from receiving this medication? Side effects that you should report to your care team as soon as possible: Allergic reactions--skin rash, itching, hives, swelling of the face, lips, tongue, or throat Cushing syndrome--increased fat around the midsection, upper back, neck, or face, pink or purple stretch marks on the skin, thinning, fragile skin that easily bruises, unexpected hair growth High blood sugar (hyperglycemia)--increased thirst or amount of urine, unusual weakness or fatigue, blurry vision Increase in blood pressure Infection--fever, chills, cough, sore throat, wounds that don't heal, pain or trouble when passing urine, general feeling of discomfort or being unwell Low adrenal gland function--nausea, vomiting, loss of appetite, unusual weakness or fatigue, dizziness Mood and behavior changes--anxiety, nervousness, confusion, hallucinations, irritability, hostility, thoughts of suicide or self-harm, worsening mood, feelings of depression Stomach bleeding--bloody or black, tar-like stools, vomiting blood  or brown material that looks like coffee grounds Swelling of the ankles, hands, or feet Side effects that usually do not require medical attention (report to your care team if they continue or are bothersome): Acne General discomfort and fatigue Headache Increase in appetite Nausea Trouble sleeping Weight gain This list may not describe all possible side effects. Call your doctor for medical advice about side effects. You may report side effects to FDA at 1-800-FDA-1088. Where should I keep my medication? Keep out of the reach of children and pets. Store at room temperature between 20 and 25 degrees C (68 and 77 degrees F). Throw away any unused medication after the expiration date. NOTE: This sheet is a summary. It may not cover all possible information. If you have questions about this medicine, talk to your doctor, pharmacist, or health care provider.  2023 Elsevier/Gold Standard (2020-09-18 00:00:00) Lidocaine  Patches What is this medication? LIDOCAINE  (LYE doe kane) treats pain. It works by numbing a specific area of the body, which blocks pain signals going to the brain. It belongs to a group of medications called local anesthetics. This medicine may be used for other purposes; ask your health care provider or pharmacist if you have questions. COMMON BRAND NAME(S): Aspercreme with Lidocaine , Blue-Emu, DERMALID, GEN7T, Lidocan, Lidocare, Lidoderm , Lidofore, LidoReal-30, Lidozo, Salonpas Lidocaine , Xyliderm , ZTlido  What should I tell my care team before I take this medication? They need to know if you have any of these conditions: G6PD deficiency Heart disease Kidney disease Liver disease Skin conditions or sensitivity An unusual or allergic reaction to lidocaine , parabens, other medications, foods, dyes, or preservatives Pregnant or trying to get pregnant Breast-feeding How should I use this medication? This medication is for external use only. Use it as directed on the label.  Do not apply to burned or damaged skin. Do not use it more often than directed. Keep using it unless your care team tells you to stop. Talk to your care team about the use of this medication in children. Special care may be needed. Overdosage: If you think you have taken too much of  this medicine contact a poison control center or emergency room at once. NOTE: This medicine is only for you. Do not share this medicine with others. What if I miss a dose? If you miss a dose, use it as soon as you can. If it is almost time for your next dose, use only that dose. Do not use double or extra doses. What may interact with this medication? This medication may interact with the following: Acetaminophen  Certain antibiotics, such as dapsone, nitrofurantoin, aminosalicylic acid, sulfasalazine Certain medications for seizures, such as phenobarbital, phenytoin, valproic acid Chloroquine Cyclophosphamide Dofetilide Flutamide Hydroxyurea Ifosfamide Local anesthetics, such as lidocaine , pramoxine, tetracaine MAOIs, such as Carbex, Eldepryl, Marplan, Nardil, and Parnate Metoclopramide  Moricizine Nitroglycerin Primaquine Saquinavir Quinine This list may not describe all possible interactions. Give your health care provider a list of all the medicines, herbs, non-prescription drugs, or dietary supplements you use. Also tell them if you smoke, drink alcohol, or use illegal drugs. Some items may interact with your medicine. What should I watch for while using this medication? Visit your care team for regular checks on your progress. Tell your care team if your symptoms do not start to get better or if they get worse. Be careful to avoid injury while the area is numb, and you are not aware of pain. If you are going to need surgery, an MRI, CT scan, or other procedure, tell your care team that you are using this medication. You may need to remove the patch before the procedure. What side effects may I notice  from receiving this medication? Side effects that you should report to your care team as soon as possible: Allergic reactions--skin rash, itching, hives, swelling of the face, lips, tongue, or throat Headache, unusual weakness or fatigue, shortness of breath, nausea, vomiting, rapid heartbeat, blue skin or lips, which may be signs of methemoglobinemia Heart rhythm changes--fast or irregular heartbeat, dizziness, feeling faint or lightheaded, chest pain, trouble breathing Side effects that usually do not require medical attention (report to your care team if they continue or are bothersome): Change in skin color Irritation at application site This list may not describe all possible side effects. Call your doctor for medical advice about side effects. You may report side effects to FDA at 1-800-FDA-1088. Where should I keep my medication? Keep out of the reach of children and pets. See product for storage information. Each product may have different instructions. Get rid of any unused medication after the expiration date. To get rid of medications that are no longer needed or have expired: Take the medication to a medication take-back program. Check with your pharmacy or law enforcement to find a location. If you cannot return the medication, ask your pharmacist or care team how to get rid of this medication safely. NOTE: This sheet is a summary. It may not cover all possible information. If you have questions about this medicine, talk to your doctor, pharmacist, or health care provider.  2023 Elsevier/Gold Standard (2021-07-12 00:00:00)     Orders Placed This Encounter  Procedures   AMB referral to sports medicine   Meds ordered this encounter  Medications   pregabalin  (LYRICA ) 100 MG capsule    Sig: Take 1 capsule (100 mg total) by mouth 2 (two) times daily.    Dispense:  60 capsule    Refill:  6   methocarbamol  (ROBAXIN ) 750 MG tablet    Sig: Take 1 tablet (750 mg total) by mouth  every 8 (eight) hours as needed for  muscle spasms.    Dispense:  90 tablet    Refill:  6    Cc: Austin Mutton, MD,  Austin Mutton, MD  Onetha Epp, MD  [facility] [address] [place], [place] [address]  Phone [phone] Fax [phone]

## 2024-01-14 NOTE — Patient Instructions (Addendum)
 Stop gabapentin  Start Lyrica  Start Robaxin  during the day(can take the flexeril  at night) Starting PT at [facility] this week(ask about the dry needling) Dr. Murl for occipital nerve blocks : Please consider providing occipital nerve blocks and cervical trigger point injections and any treatment for cervical myofascial pain and occipital neuralgia per clinical judgement.

## 2024-01-15 NOTE — Telephone Encounter (Signed)
 This came back denied saying they need: notes from your doctor that say you have new nerve problems and your exam by your doctor shows a nerve problem (such as one-sided weakness, sensation, or reflex changes). (We need more details of the weakness documented in the exam). The peer to peer and the phone number is [phone] tracking #[id number] if you want to do that or you can update her office note and I can submit a new PA but that will probably take awhile to process again.

## 2024-01-19 ENCOUNTER — Ambulatory Visit: Payer: MEDICAID | Attending: Neurology

## 2024-01-19 DIAGNOSIS — R29898 Other symptoms and signs involving the musculoskeletal system: Secondary | ICD-10-CM | POA: Insufficient documentation

## 2024-01-19 DIAGNOSIS — M5481 Occipital neuralgia: Secondary | ICD-10-CM | POA: Insufficient documentation

## 2024-01-19 DIAGNOSIS — M542 Cervicalgia: Secondary | ICD-10-CM | POA: Insufficient documentation

## 2024-01-19 NOTE — Therapy (Signed)
 OUTPATIENT PHYSICAL THERAPY NEURO EVALUATION   Patient Name: [name] [name] MRN: [id number] DOB:13-Jul-1980, 43 y.o., female Today's Date: 01/19/2024   PCP: Austin Mutton, MD REFERRING PROVIDER: Ines Onetha NOVAK, MD  END OF SESSION:  PT End of Session - 01/19/24 1104     Visit Number 1    Number of Visits 5    Date for PT Re-Evaluation 02/23/24    Authorization Type [employer]    Authorization Time Period auth required    PT Start Time 1105    PT Stop Time 1145    PT Time Calculation (min) 40 min          Past Medical History:  Diagnosis Date   Allergy    Arthritis    right knee   Asthma    Constipation    Depression    GERD (gastroesophageal reflux disease)    History of kidney stones    Panic disorder    Pneumonia    Renal disorder    Seasonal allergies    SVD (spontaneous vaginal delivery)    x 3   Past Surgical History:  Procedure Laterality Date   BILATERAL SALPINGECTOMY Bilateral 05/31/2013   Procedure: BILATERAL SALPINGECTOMY;  Surgeon: Krystal Deaner, MD;  Location: [facility];  Service: Gynecology;  Laterality: Bilateral;   BREAST CYST ASPIRATION Right 2015   BREAST CYST EXCISION Right    2015   DILATION AND CURETTAGE OF UTERUS     KIDNEY STONE SURGERY     KNEE ARTHROSCOPY Right    KNEE ARTHROSCOPY WITH DRILLING/MICROFRACTURE Right 07/26/2014   Procedure: RIGHT KNEE ARTHROSCOPY WITH CHONDROPLASTY, MICROFRACTURE;  Surgeon: Kay Ozell Cummins, MD;  Location: [facility];  Service: Orthopedics;  Laterality: Right;   KNEE ARTHROSCOPY WITH MEDIAL MENISECTOMY Right 07/26/2014   Procedure: KNEE ARTHROSCOPY WITH MEDIAL MENISECTOMY;  Surgeon: Kay Ozell Cummins, MD;  Location: [facility];  Service: Orthopedics;  Laterality: Right;   KNEE ARTHROSCOPY WITH MEDIAL MENISECTOMY Left 05/07/2022   Procedure: LEFT KNEE ARTHROSCOPY WITH DEBRIDEMENT AND CHONDROPLASTY PARTIAL MEDIAL MENISCECTOMY;  Surgeon: Cummins Kay HERO, MD;   Location: [facility];  Service: Orthopedics;  Laterality: Left;   LAPAROSCOPIC ASSISTED VAGINAL HYSTERECTOMY N/A 05/31/2013   Procedure: LAPAROSCOPIC ASSISTED VAGINAL HYSTERECTOMY;  Surgeon: Krystal Deaner, MD;  Location: [facility];  Service: Gynecology;  Laterality: N/A;   LAPAROSCOPY N/A 08/26/2017   Procedure: LAPAROSCOPY OPERATIVE WITH PERIOTONEAL BIOPSY AND FULGERATION OF ENDOMETRIOSIS;  Surgeon: Deaner Krystal, MD;  Location: [facility];  Service: Gynecology;  Laterality: N/A;   LUMBAR LAMINECTOMY/DECOMPRESSION MICRODISCECTOMY Right 12/31/2018   Procedure: MICRODISCECTOMY LUMBAR 5- SACRAL1;  Surgeon: Lanis Pupa, MD;  Location: [facility];  Service: Neurosurgery;  Laterality: Right;  MICRODISCECTOMY LUMBAR 5- SACRAL1   TONSILLECTOMY     TONSILLECTOMY AND ADENOIDECTOMY     TOTAL KNEE ARTHROPLASTY Right 08/08/2019   Procedure: RIGHT TOTAL KNEE ARTHROPLASTY;  Surgeon: Cummins Kay HERO, MD;  Location: [facility];  Service: Orthopedics;  Laterality: Right;   TUBAL LIGATION     UPPER GI ENDOSCOPY     ulcer   Patient Active Problem List   Diagnosis Date Noted   Synovitis of knee 05/07/2022   Acute medial meniscus tear of left knee 04/24/2022   Lumbar radiculopathy 01/20/2020   Status post total right knee replacement 08/08/2019   Pelvic pain in female 08/26/2017   Pelvic peritoneal adhesions, female 08/26/2017   Endometriosis of pelvis 08/26/2017   Unilateral primary osteoarthritis, right knee 06/09/2017   Unilateral  primary osteoarthritis, left knee 06/09/2017   Family history of breast cancer in mother 12/06/2015   Menorrhagia 05/31/2013   Dysmenorrhea 05/31/2013   Dyspareunia 05/31/2013   Asthma 08/05/2011    ONSET DATE: 12/29/23  REFERRING DIAG:  M54.81 (ICD-10-CM) - Occipital neuralgia of right side    THERAPY DIAG:  Cervicalgia - Plan: PT plan of care cert/re-cert  Other symptoms and signs involving the musculoskeletal system - Plan: PT plan of care  cert/re-cert  Rationale for Evaluation and Treatment: Rehabilitation  SUBJECTIVE:                                                                                                                                                                                             SUBJECTIVE STATEMENT: Pt reports experiencing sharp stabbing pain in back of head end of June with sudden onset of more exquisite pain in the neck and right side of head requiring hospitalization due to nature of pain.  Pt reports episodic hx of severe neck pain throughout her adult life w/ increased frequency in past few years.  At present having pain at base of head right side neck and some instances of lower left side neck pain and notes this is usually provoked with end-range ROM.  Pt accompanied by: self  PERTINENT HISTORY: She has pain on the right side of the back of the head. Feels stabbing and sore. She has chronic neck pain. She has decreased range of motion of the neck. Worse with movement. The stabs are better with staying still. No light or sound sensitivity, no nausea. Her fingertips started tingling on both hands. All the fingers. She hs the pain currently, a stagnant pain  PAIN:  Are you having pain? Yes: NPRS scale: 5/10 Pain location: right base of skull/neck Pain description: aggravating Aggravating factors: active neck movements Relieving factors: cold, rest/inactivity  PRECAUTIONS: None  RED FLAGS: Cervical red flags: Dysphagia No, Dysmetria No, Diplopia No, Nystagmus No, and Nausea No   WEIGHT BEARING RESTRICTIONS: No  FALLS: Has patient fallen in last 6 months? No    PLOF: Independent  PATIENT GOALS: reduce neck pain  OBJECTIVE:  Note: Objective measures were completed at Evaluation unless otherwise noted.  DIAGNOSTIC FINDINGS:  IMPRESSION: 1. No acute intracranial pathology. No noncontrast CT findings to explain headache. 2. No fracture or static subluxation of the cervical spine. 3.  Moderate multilevel cervical disc degenerative disease, worst from C5-C7. 4. No noncontrast CT evidence of infection in the head or neck. Note that contrast enhanced MRI would be the imaging test of choice for intracranial or cervical infection such as cerebritis, abscess, or discitis/osteomyelitis.  COGNITION: Overall cognitive status: Within functional limits for tasks assessed   SENSATION: WFL  COORDINATION: intact  EDEMA:  None noted  MUSCLE TONE: WNL     POSTURE: No Significant postural limitations  PALPATION:  tenderness to palpation suboccipital R >L; right cervical column tenderness to palpation and trigger points throughout  Cervical ROM:  AROM Flexion: 20 deg Extension: 35 deg Right lateral: 15 deg Left lateral: 15 deg Right rotation: 35 deg Left rotation: 40 deg  PROM:  Flexion: pain at end-range Extension: NT in supine Rotation: WFL, pain at end-range Lateral flexion: WFL pain at end-range  Upper cervical rotation: WFL pain at end-range right rotation > left  UE ROM and strength WNL and 5/5 strength  Independent functional mobility GAIT: Findings: Comments: independent, no deviation appreciated    PATIENT SURVEYS:  NDI: TBD                                                                                                                              TREATMENT DATE:     PATIENT EDUCATION: Education details: assessment details, rationale of PT intervention, dry needling info Person educated: Patient Education method: Explanation and Handouts Education comprehension: verbalized understanding  HOME EXERCISE PROGRAM: Access Code: [email or web address] URL: [email or web address] Date: 01/19/2024 Prepared by: Burnard Sandifer  Exercises - Supine Cervical Retraction with Towel  - 1 x daily - 7 x weekly - 1-3 sets - 10 reps - 2-3 hold - Cervical Extension AROM with Strap  - 1 x daily - 7 x weekly - 1-3 sets - 10 reps - Upper Cervical Extension  SNAG with Strap  - 1 x daily - 7 x weekly - 1-3 sets - 10 reps  GOALS: Goals reviewed with patient? Yes  SHORT TERM GOALS: Target date: same as LTG    LONG TERM GOALS: Target date: 02/23/2024    Neck Disability Index to be performed and score accordingly Baseline:  Goal status: INITIAL  2.  Patient will be independent in HEP to improve functional outcomes Baseline:  Goal status: INITIAL  3.  Pain with AROM to not exceed 3/10 to improve comfort with ADL/driving Baseline: 4-1/89 Goal status: INITIAL  4.  Improve cervical AROM to WNL to improve comfort/safety with ADL/driving Baseline: see above Goal status: INITIAL    ASSESSMENT:  CLINICAL IMPRESSION: Patient is a 43 y.o. lady who was seen today for physical therapy evaluation and treatment for occipital neuralgia.  Demonstrates ongoing issues of neck pain, limited cervical AROM, soft tissue restrictions, and pain to passive ROM at end-range limits.  At present, pt reports pain is limiting in all functional mobility and activities and difficult to find position of comfort. Would benefit from PT interventions to address deficits and limitations to improve activity tolerance and return to PLOF   OBJECTIVE IMPAIRMENTS: decreased activity tolerance, decreased ROM, increased muscle spasms, impaired flexibility, and pain.   ACTIVITY LIMITATIONS: carrying, lifting, sleeping, reach over head, and hygiene/grooming  PARTICIPATION LIMITATIONS:  meal prep, cleaning, driving, occupation, and yard work  PERSONAL FACTORS: Age, Time since onset of injury/illness/exacerbation, and 1 comorbidity: PMH are also affecting patient's functional outcome.   REHAB POTENTIAL: Good  CLINICAL DECISION MAKING: Evolving/moderate complexity  EVALUATION COMPLEXITY: Moderate  PLAN:  PT FREQUENCY: 1x/week  PT DURATION: 4 weeks  PLANNED INTERVENTIONS: 97750- Physical Performance Testing, 97110-Therapeutic exercises, 97530- Therapeutic activity, [id number]-  Neuromuscular re-education, 97535- Self Care, 02859- Manual therapy, [id number]- Gait training, [phone]- Canalith repositioning, [id number]- Aquatic Therapy, H9716- Electrical stimulation (unattended), and 20560 (1-2 muscles), 20561 (3+ muscles)- Dry Needling  PLAN FOR NEXT SESSION: NDI, HEP review, manual therapy, dry needling info provided--f/u if interested   1:03 PM, 01/19/24 M. Kelly Tayvon Culley, PT, DPT Physical Therapist- [place] Office Number: [phone]    For all possible CPT codes, reference the Planned Interventions line above.     Check all conditions that are expected to impact treatment: {Conditions expected to impact treatment:None of these apply   If treatment provided at initial evaluation, no treatment charged due to lack of authorization.

## 2024-01-25 NOTE — Telephone Encounter (Signed)
 I called the patient and LVM (ok per DPR) informing her that unfortunately insurance is denying the MRI. Dr Ines wants to know if the patient is having any arm weakness. Left office number and hours for call back. Also sent her a [employer].

## 2024-01-25 NOTE — Telephone Encounter (Signed)
 Let patient know they are not approving the MRI. Does she have any arm weakness that she is aware of? Thank you

## 2024-01-26 ENCOUNTER — Encounter: Payer: Self-pay | Admitting: Physical Therapy

## 2024-01-26 ENCOUNTER — Ambulatory Visit: Payer: MEDICAID | Admitting: Physical Therapy

## 2024-01-26 DIAGNOSIS — R29898 Other symptoms and signs involving the musculoskeletal system: Secondary | ICD-10-CM

## 2024-01-26 DIAGNOSIS — M542 Cervicalgia: Secondary | ICD-10-CM | POA: Diagnosis not present

## 2024-01-26 NOTE — Therapy (Signed)
 OUTPATIENT PHYSICAL THERAPY NEURO TREATMENT NOTE   Patient Name: [name] [name] MRN: [id number] DOB:03-23-1981, 43 y.o., female Today's Date: 01/26/2024   PCP: Austin Mutton, MD REFERRING PROVIDER: Ines Onetha NOVAK, MD  END OF SESSION:  PT End of Session - 01/26/24 0934     Visit Number 2    Number of Visits 5    Date for PT Re-Evaluation 02/23/24    Authorization Type [employer]    Authorization Time Period auth required    PT Start Time 0935    PT Stop Time 1017    PT Time Calculation (min) 42 min    Activity Tolerance Patient tolerated treatment well;Patient limited by pain    Behavior During Therapy [facility] for tasks assessed/performed           Past Medical History:  Diagnosis Date   Allergy    Arthritis    right knee   Asthma    Constipation    Depression    GERD (gastroesophageal reflux disease)    History of kidney stones    Panic disorder    Pneumonia    Renal disorder    Seasonal allergies    SVD (spontaneous vaginal delivery)    x 3   Past Surgical History:  Procedure Laterality Date   BILATERAL SALPINGECTOMY Bilateral 05/31/2013   Procedure: BILATERAL SALPINGECTOMY;  Surgeon: Krystal Deaner, MD;  Location: [facility];  Service: Gynecology;  Laterality: Bilateral;   BREAST CYST ASPIRATION Right 2015   BREAST CYST EXCISION Right    2015   DILATION AND CURETTAGE OF UTERUS     KIDNEY STONE SURGERY     KNEE ARTHROSCOPY Right    KNEE ARTHROSCOPY WITH DRILLING/MICROFRACTURE Right 07/26/2014   Procedure: RIGHT KNEE ARTHROSCOPY WITH CHONDROPLASTY, MICROFRACTURE;  Surgeon: Kay Ozell Cummins, MD;  Location: [facility];  Service: Orthopedics;  Laterality: Right;   KNEE ARTHROSCOPY WITH MEDIAL MENISECTOMY Right 07/26/2014   Procedure: KNEE ARTHROSCOPY WITH MEDIAL MENISECTOMY;  Surgeon: Kay Ozell Cummins, MD;  Location: [facility];  Service: Orthopedics;  Laterality: Right;   KNEE ARTHROSCOPY WITH MEDIAL MENISECTOMY Left  05/07/2022   Procedure: LEFT KNEE ARTHROSCOPY WITH DEBRIDEMENT AND CHONDROPLASTY PARTIAL MEDIAL MENISCECTOMY;  Surgeon: Cummins Kay HERO, MD;  Location: [facility];  Service: Orthopedics;  Laterality: Left;   LAPAROSCOPIC ASSISTED VAGINAL HYSTERECTOMY N/A 05/31/2013   Procedure: LAPAROSCOPIC ASSISTED VAGINAL HYSTERECTOMY;  Surgeon: Krystal Deaner, MD;  Location: [facility];  Service: Gynecology;  Laterality: N/A;   LAPAROSCOPY N/A 08/26/2017   Procedure: LAPAROSCOPY OPERATIVE WITH PERIOTONEAL BIOPSY AND FULGERATION OF ENDOMETRIOSIS;  Surgeon: Deaner Krystal, MD;  Location: [facility];  Service: Gynecology;  Laterality: N/A;   LUMBAR LAMINECTOMY/DECOMPRESSION MICRODISCECTOMY Right 12/31/2018   Procedure: MICRODISCECTOMY LUMBAR 5- SACRAL1;  Surgeon: Lanis Pupa, MD;  Location: [facility];  Service: Neurosurgery;  Laterality: Right;  MICRODISCECTOMY LUMBAR 5- SACRAL1   TONSILLECTOMY     TONSILLECTOMY AND ADENOIDECTOMY     TOTAL KNEE ARTHROPLASTY Right 08/08/2019   Procedure: RIGHT TOTAL KNEE ARTHROPLASTY;  Surgeon: Cummins Kay HERO, MD;  Location: [facility];  Service: Orthopedics;  Laterality: Right;   TUBAL LIGATION     UPPER GI ENDOSCOPY     ulcer   Patient Active Problem List   Diagnosis Date Noted   Synovitis of knee 05/07/2022   Acute medial meniscus tear of left knee 04/24/2022   Lumbar radiculopathy 01/20/2020   Status post total right knee replacement 08/08/2019   Pelvic pain in female 08/26/2017  Pelvic peritoneal adhesions, female 08/26/2017   Endometriosis of pelvis 08/26/2017   Unilateral primary osteoarthritis, right knee 06/09/2017   Unilateral primary osteoarthritis, left knee 06/09/2017   Family history of breast cancer in mother 12/06/2015   Menorrhagia 05/31/2013   Dysmenorrhea 05/31/2013   Dyspareunia 05/31/2013   Asthma 08/05/2011    ONSET DATE: 12/29/23  REFERRING DIAG:  M54.81 (ICD-10-CM) - Occipital neuralgia of right side    THERAPY DIAG:   Cervicalgia  Other symptoms and signs involving the musculoskeletal system  Rationale for Evaluation and Treatment: Rehabilitation  SUBJECTIVE:                                                                                                                                                                                             SUBJECTIVE STATEMENT: Pain is not as bad as it was.  Just trying to be careful Pt accompanied by: self  PERTINENT HISTORY: She has pain on the right side of the back of the head. Feels stabbing and sore. She has chronic neck pain. She has decreased range of motion of the neck. Worse with movement. The stabs are better with staying still. No light or sound sensitivity, no nausea. Her fingertips started tingling on both hands. All the fingers. She hs the pain currently, a stagnant pain  PAIN:  Are you having pain? Yes: NPRS scale: 4-5/10 Pain location: right base of skull/neck Pain description: aggravating Aggravating factors: active neck movements Relieving factors: cold, rest/inactivity  PRECAUTIONS: None  RED FLAGS: Cervical red flags: Dysphagia No, Dysmetria No, Diplopia No, Nystagmus No, and Nausea No   WEIGHT BEARING RESTRICTIONS: No  FALLS: Has patient fallen in last 6 months? No    PLOF: Independent  PATIENT GOALS: reduce neck pain  OBJECTIVE:    TODAY'S TREATMENT: 01/26/2024 Activity Comments  Review of HEP Min cues for technique to avoid pain with stretching; cues to avoid tipping chin up with supine neck retraction  Supine scapular retraction x 5 reps   Manual therapy stretch and STM to occipital area, UT, levator scap  Tender, tight to palpation UT, R>L  Seated shoulder rolls   Seated neck rotation with towel stretch, 3 x 5 sec Cues for technique; pt reports some relief      HOME EXERCISE PROGRAM: Access Code: [email or web address] URL: [email or web address] Date: 01/26/2024 Prepared by: [facility]  Exercises - Supine Cervical Retraction with Towel  - 1 x daily - 7 x weekly - 1-3 sets - 10 reps - 2-3 hold - Cervical Extension AROM with Strap  - 1 x daily - 7 x  weekly - 1-3 sets - 10 reps - Upper Cervical Extension SNAG with Strap  - 1 x daily - 7 x weekly - 1-3 sets - 10 reps - Supine Scapular Retraction  - 1 x daily - 7 x weekly - 1-2 sets - 10 reps - Shoulder Rolls in Sitting  - 1 x daily - 7 x weekly - 1-2 sets - 5-10 reps - Seated Assisted Cervical Rotation with Towel  - 1 x daily - 7 x weekly - 1 sets - 5 reps  PATIENT EDUCATION: Education details: HEP review and updates Person educated: Patient Education method: Explanation, Demonstration, and Handouts Education comprehension: verbalized understanding, returned demonstration, and needs further education  ---------------------------------------------- Note: Objective measures were completed at Evaluation unless otherwise noted.  DIAGNOSTIC FINDINGS:  IMPRESSION: 1. No acute intracranial pathology. No noncontrast CT findings to explain headache. 2. No fracture or static subluxation of the cervical spine. 3. Moderate multilevel cervical disc degenerative disease, worst from C5-C7. 4. No noncontrast CT evidence of infection in the head or neck. Note that contrast enhanced MRI would be the imaging test of choice for intracranial or cervical infection such as cerebritis, abscess, or discitis/osteomyelitis.  COGNITION: Overall cognitive status: Within functional limits for tasks assessed   SENSATION: WFL  COORDINATION: intact  EDEMA:  None noted  MUSCLE TONE: WNL     POSTURE: No Significant postural limitations  PALPATION:  tenderness to palpation suboccipital R >L; right cervical column tenderness to palpation and trigger points throughout  Cervical ROM:  AROM Flexion: 20 deg Extension: 35 deg Right lateral: 15 deg Left lateral: 15 deg Right rotation: 35 deg Left rotation: 40 deg  PROM:   Flexion: pain at end-range Extension: NT in supine Rotation: WFL, pain at end-range Lateral flexion: WFL pain at end-range  Upper cervical rotation: WFL pain at end-range right rotation > left  UE ROM and strength WNL and 5/5 strength  Independent functional mobility GAIT: Findings: Comments: independent, no deviation appreciated    PATIENT SURVEYS:  NDI: TBD                                                                                                                              TREATMENT DATE:     PATIENT EDUCATION: Education details: assessment details, rationale of PT intervention, dry needling info Person educated: Patient Education method: Explanation and Handouts Education comprehension: verbalized understanding  HOME EXERCISE PROGRAM: Access Code: [email or web address] URL: [email or web address] Date: 01/19/2024 Prepared by: Burnard Sandifer  Exercises - Supine Cervical Retraction with Towel  - 1 x daily - 7 x weekly - 1-3 sets - 10 reps - 2-3 hold - Cervical Extension AROM with Strap  - 1 x daily - 7 x weekly - 1-3 sets - 10 reps - Upper Cervical Extension SNAG with Strap  - 1 x daily - 7 x weekly - 1-3 sets - 10 reps  GOALS: Goals reviewed with patient? Yes  SHORT TERM GOALS: Target date:  same as LTG    LONG TERM GOALS: Target date: 02/23/2024    Neck Disability Index to be performed and score accordingly Baseline: 28/50 Goal status: IN PROGRESS  2.  Patient will be independent in HEP to improve functional outcomes Baseline:  Goal status: IN PROGRESS  3.  Pain with AROM to not exceed 3/10 to improve comfort with ADL/driving Baseline: 4-1/89 Goal status: IN PROGRESS  4.  Improve cervical AROM to WNL to improve comfort/safety with ADL/driving Baseline: see above Goal status: IN PROGRESS    ASSESSMENT:  CLINICAL IMPRESSION: Pt presents today with no new complaints; overall less pain, but still bothersome pain in shoulders and neck area.  Skilled PT session focused on review and update of HEP.  She reports burning of muscles at end range of exercise and PT provides cues to back off/perform at about 50% to achieve motion but not pain.  Pt has increased tightness/tenderness to palpation along UT, occiput area, and reports some relief after STM/manual stretch.  She does report slight increase in aggravation of pain at end of session; no other complaints.  Pt will continue to benefit from skilled PT towards goals for improved functional mobility and decreased pain.   OBJECTIVE IMPAIRMENTS: decreased activity tolerance, decreased ROM, increased muscle spasms, impaired flexibility, and pain.   ACTIVITY LIMITATIONS: carrying, lifting, sleeping, reach over head, and hygiene/grooming  PARTICIPATION LIMITATIONS: meal prep, cleaning, driving, occupation, and yard work  PERSONAL FACTORS: Age, Time since onset of injury/illness/exacerbation, and 1 comorbidity: PMH are also affecting patient's functional outcome.   REHAB POTENTIAL: Good  CLINICAL DECISION MAKING: Evolving/moderate complexity  EVALUATION COMPLEXITY: Moderate  PLAN:  PT FREQUENCY: 1x/week  PT DURATION: 4 weeks  PLANNED INTERVENTIONS: 97750- Physical Performance Testing, 97110-Therapeutic exercises, 97530- Therapeutic activity, 97112- Neuromuscular re-education, 97535- Self Care, 02859- Manual therapy, [phone]- Gait training, [phone]- Canalith repositioning, [id number]- Aquatic Therapy, [phone]- Electrical stimulation (unattended), and 20560 (1-2 muscles), 20561 (3+ muscles)- Dry Needling  PLAN FOR NEXT SESSION: HEP review, manual therapy, dry needling info provided--f/u if interested   Greig Anon, PT 01/26/24 10:20 AM Phone: [phone] Fax: [phone]  [facility] [address] [place], [place] [address] Phone # [phone] Fax # [phone]    For all possible CPT codes, reference the Planned  Interventions line above.     Check all conditions that are expected to impact treatment: {Conditions expected to impact treatment:None of these apply   If treatment provided at initial evaluation, no treatment charged due to lack of authorization.

## 2024-01-26 NOTE — Therapy (Deleted)
 OUTPATIENT PHYSICAL THERAPY NEURO EVALUATION   Patient Name: [name] [name] MRN: [id number] DOB:10/24/1980, 43 y.o., female Today's Date: 01/26/2024   PCP: Austin Mutton, MD REFERRING PROVIDER: Ines Onetha NOVAK, MD  END OF SESSION:    Past Medical History:  Diagnosis Date   Allergy    Arthritis    right knee   Asthma    Constipation    Depression    GERD (gastroesophageal reflux disease)    History of kidney stones    Panic disorder    Pneumonia    Renal disorder    Seasonal allergies    SVD (spontaneous vaginal delivery)    x 3   Past Surgical History:  Procedure Laterality Date   BILATERAL SALPINGECTOMY Bilateral 05/31/2013   Procedure: BILATERAL SALPINGECTOMY;  Surgeon: Krystal Deaner, MD;  Location: [facility];  Service: Gynecology;  Laterality: Bilateral;   BREAST CYST ASPIRATION Right 2015   BREAST CYST EXCISION Right    2015   DILATION AND CURETTAGE OF UTERUS     KIDNEY STONE SURGERY     KNEE ARTHROSCOPY Right    KNEE ARTHROSCOPY WITH DRILLING/MICROFRACTURE Right 07/26/2014   Procedure: RIGHT KNEE ARTHROSCOPY WITH CHONDROPLASTY, MICROFRACTURE;  Surgeon: Kay Ozell Cummins, MD;  Location: [facility];  Service: Orthopedics;  Laterality: Right;   KNEE ARTHROSCOPY WITH MEDIAL MENISECTOMY Right 07/26/2014   Procedure: KNEE ARTHROSCOPY WITH MEDIAL MENISECTOMY;  Surgeon: Kay Ozell Cummins, MD;  Location: [facility];  Service: Orthopedics;  Laterality: Right;   KNEE ARTHROSCOPY WITH MEDIAL MENISECTOMY Left 05/07/2022   Procedure: LEFT KNEE ARTHROSCOPY WITH DEBRIDEMENT AND CHONDROPLASTY PARTIAL MEDIAL MENISCECTOMY;  Surgeon: Cummins Kay HERO, MD;  Location:  [facility];  Service: Orthopedics;  Laterality: Left;   LAPAROSCOPIC ASSISTED VAGINAL HYSTERECTOMY N/A 05/31/2013   Procedure: LAPAROSCOPIC ASSISTED VAGINAL HYSTERECTOMY;  Surgeon: Krystal Deaner, MD;  Location: [facility];  Service: Gynecology;  Laterality: N/A;   LAPAROSCOPY N/A  08/26/2017   Procedure: LAPAROSCOPY OPERATIVE WITH PERIOTONEAL BIOPSY AND FULGERATION OF ENDOMETRIOSIS;  Surgeon: Deaner Krystal, MD;  Location: [facility];  Service: Gynecology;  Laterality: N/A;   LUMBAR LAMINECTOMY/DECOMPRESSION MICRODISCECTOMY Right 12/31/2018   Procedure: MICRODISCECTOMY LUMBAR 5- SACRAL1;  Surgeon: Lanis Pupa, MD;  Location: [facility];  Service: Neurosurgery;  Laterality: Right;  MICRODISCECTOMY LUMBAR 5- SACRAL1   TONSILLECTOMY     TONSILLECTOMY AND ADENOIDECTOMY     TOTAL KNEE ARTHROPLASTY Right 08/08/2019   Procedure: RIGHT TOTAL KNEE ARTHROPLASTY;  Surgeon: Cummins Kay HERO, MD;  Location[facility];  Service: Orthopedics;  Laterality: Right;   TUBAL LIGATION     UPPER GI ENDOSCOPY     ulcer   Patient Active Problem List   Diagnosis Date Noted   Synovitis of knee 05/07/2022   Acute medial meniscus tear of left knee 04/24/2022   Lumbar radiculopathy 01/20/2020   Status post total right knee replacement 08/08/2019   Pelvic pain in female 08/26/2017   Pelvic peritoneal adhesions, female 08/26/2017   Endometriosis of pelvis 08/26/2017   Unilateral primary osteoarthritis, right knee 06/09/2017   Unilateral primary osteoarthritis, left knee 06/09/2017   Family history of breast cancer in mother 12/06/2015   Menorrhagia 05/31/2013   Dysmenorrhea 05/31/2013   Dyspareunia 05/31/2013   Asthma 08/05/2011    ONSET DATE: 12/29/23  REFERRING DIAG:  M54.81 (ICD-10-CM) - Occipital neuralgia of right side    THERAPY DIAG:  No diagnosis found.  Rationale for Evaluation and Treatment: Rehabilitation  SUBJECTIVE:  SUBJECTIVE STATEMENT: ***  From eval: Pt reports experiencing sharp stabbing pain in back of head end of June with sudden onset of more exquisite pain in the neck and right  side of head requiring hospitalization due to nature of pain.  Pt reports episodic hx of severe neck pain throughout her adult life w/ increased frequency in past few years.  At present having pain at base of head right side neck and some instances of lower left side neck pain and notes this is usually provoked with end-range ROM.  Pt accompanied by: self  PERTINENT HISTORY: She has pain on the right side of the back of the head. Feels stabbing and sore. She has chronic neck pain. She has decreased range of motion of the neck. Worse with movement. The stabs are better with staying still. No light or sound sensitivity, no nausea. Her fingertips started tingling on both hands. All the fingers. She hs the pain currently, a stagnant pain  PAIN:  Are you having pain? Yes: NPRS scale: 5/10 Pain location: right base of skull/neck Pain description: aggravating Aggravating factors: active neck movements Relieving factors: cold, rest/inactivity  PRECAUTIONS: None  RED FLAGS: Cervical red flags: Dysphagia No, Dysmetria No, Diplopia No, Nystagmus No, and Nausea No   WEIGHT BEARING RESTRICTIONS: No  FALLS: Has patient fallen in last 6 months? No  PATIENT GOALS: reduce neck pain  OBJECTIVE:  Note: Objective measures were completed at Evaluation unless otherwise noted.  DIAGNOSTIC FINDINGS:  IMPRESSION: 1. No acute intracranial pathology. No noncontrast CT findings to explain headache. 2. No fracture or static subluxation of the cervical spine. 3. Moderate multilevel cervical disc degenerative disease, worst from C5-C7. 4. No noncontrast CT evidence of infection in the head or neck. Note that contrast enhanced MRI would be the imaging test of choice for intracranial or cervical infection such as cerebritis, abscess, or discitis/osteomyelitis.  COORDINATION: intact  PALPATION:  tenderness to palpation suboccipital R >L; right cervical column tenderness to palpation and trigger points  throughout  Cervical ROM:  AROM Flexion: 20 deg Extension: 35 deg Right lateral: 15 deg Left lateral: 15 deg Right rotation: 35 deg Left rotation: 40 deg  PROM:  Flexion: pain at end-range Extension: NT in supine Rotation: WFL, pain at end-range Lateral flexion: WFL pain at end-range  Upper cervical rotation: WFL pain at end-range right rotation > left  UE ROM and strength WNL and 5/5 strength  Independent functional mobility GAIT: Findings: Comments: independent, no deviation appreciated    PATIENT SURVEYS:  NDI: ** 01/26/24                                                                                                                              TREATMENT DATE:   01/26/2024 Activity Comments                 PATIENT EDUCATION: Education details: assessment details, rationale of PT intervention, dry needling info Person educated: Patient Education  method: Explanation and Handouts Education comprehension: verbalized understanding  HOME EXERCISE PROGRAM: Access Code: [email or web address] URL: [email or web address] Date: 01/19/2024 Prepared by: Burnard Sandifer  Exercises - Supine Cervical Retraction with Towel  - 1 x daily - 7 x weekly - 1-3 sets - 10 reps - 2-3 hold - Cervical Extension AROM with Strap  - 1 x daily - 7 x weekly - 1-3 sets - 10 reps - Upper Cervical Extension SNAG with Strap  - 1 x daily - 7 x weekly - 1-3 sets - 10 reps  GOALS: Goals reviewed with patient? Yes  SHORT TERM GOALS: Target date: same as LTG    LONG TERM GOALS: Target date: 02/23/2024    Neck Disability Index to be performed and score accordingly Baseline:  Goal status: INITIAL  2.  Patient will be independent in HEP to improve functional outcomes Baseline:  Goal status: INITIAL  3.  Pain with AROM to not exceed 3/10 to improve comfort with ADL/driving Baseline: 4-1/89 Goal status: INITIAL  4.  Improve cervical AROM to WNL to improve comfort/safety with  ADL/driving Baseline: see above Goal status: INITIAL    ASSESSMENT:  CLINICAL IMPRESSION: ***  From eval: Patient is a 43 y.o. lady who was seen today for physical therapy evaluation and treatment for occipital neuralgia.  Demonstrates ongoing issues of neck pain, limited cervical AROM, soft tissue restrictions, and pain to passive ROM at end-range limits.  At present, pt reports pain is limiting in all functional mobility and activities and difficult to find position of comfort. Would benefit from PT interventions to address deficits and limitations to improve activity tolerance and return to PLOF   OBJECTIVE IMPAIRMENTS: decreased activity tolerance, decreased ROM, increased muscle spasms, impaired flexibility, and pain.    PLAN:  PT FREQUENCY: 1x/week  PT DURATION: 4 weeks  PLANNED INTERVENTIONS: 97750- Physical Performance Testing, 97110-Therapeutic exercises, 97530- Therapeutic activity, [id number]- Neuromuscular re-education, 97535- Self Care, 02859- Manual therapy, [phone]- Gait training, [phone]- Canalith repositioning, [id number]- Aquatic Therapy, [phone]- Electrical stimulation (unattended), and 20560 (1-2 muscles), 20561 (3+ muscles)- Dry Needling  PLAN FOR NEXT SESSION: NDI, HEP review, manual therapy, dry needling info provided--f/u if interested   8:25 AM, 01/26/24 Avonell Lenig April Ma L Tommey Barret, PT, DPT Physical TherapistGLENWOOD Dunn Office Number: [phone]    For all possible CPT codes, reference the Planned Interventions line above.     Check all conditions that are expected to impact treatment: {Conditions expected to impact treatment:None of these apply   If treatment provided at initial evaluation, no treatment charged due to lack of authorization.

## 2024-01-27 ENCOUNTER — Other Ambulatory Visit (HOSPITAL_COMMUNITY): Payer: Self-pay

## 2024-01-27 ENCOUNTER — Telehealth: Payer: Self-pay | Admitting: Pharmacy Technician

## 2024-01-27 NOTE — Telephone Encounter (Signed)
 Pharmacy Patient Advocate Encounter   Received notification from CoverMyMeds that prior authorization for Lidocaine  5% patches is required/requested.   Insurance verification completed.   The patient is insured through [facility] .   Per test claim: PA required; PA submitted to above mentioned insurance via CoverMyMeds Key/confirmation #/EOC [id number] Status is pending

## 2024-01-28 ENCOUNTER — Other Ambulatory Visit (HOSPITAL_COMMUNITY): Payer: Self-pay

## 2024-01-28 NOTE — Telephone Encounter (Signed)
 Pharmacy Patient Advocate Encounter  Received notification from [facility] that Prior Authorization for Lidocaine  5% patches has been APPROVED from 01/27/2024 to 01/26/2025. Ran test claim, Copay is $4.00. This test claim was processed through [facility]- copay amounts may vary at other pharmacies due to pharmacy/plan contracts, or as the patient moves through the different stages of their insurance plan.   PA #/Case ID/Reference #: PA Case ID #: [id number]

## 2024-01-28 NOTE — Telephone Encounter (Signed)
 Called pt again and LVM (ok per DPR) informing her that MRI was denied but Dr Ines wants to know if pt has noted any arm weakness. Left office number for call back and also informed pt that she can reply to the [place] message as well.

## 2024-02-01 NOTE — Telephone Encounter (Signed)
 I think this is sufficient, she will call us  back if she likes. Thank you

## 2024-02-02 ENCOUNTER — Ambulatory Visit: Payer: MEDICAID

## 2024-02-02 DIAGNOSIS — M542 Cervicalgia: Secondary | ICD-10-CM

## 2024-02-02 DIAGNOSIS — R29898 Other symptoms and signs involving the musculoskeletal system: Secondary | ICD-10-CM

## 2024-02-02 NOTE — Therapy (Signed)
 OUTPATIENT PHYSICAL THERAPY NEURO TREATMENT NOTE   Patient Name: [name] [name] MRN: [id number] DOB:07/17/1980, 43 y.o., female Today's Date: 02/02/2024   PCP: Austin Mutton, MD REFERRING PROVIDER: Ines Onetha NOVAK, MD  END OF SESSION:  PT End of Session - 02/02/24 0935     Visit Number 3    Number of Visits 5    Date for PT Re-Evaluation 02/23/24    Authorization Type [employer]    Authorization Time Period auth required    PT Start Time 0935    PT Stop Time 1015    PT Time Calculation (min) 40 min    Activity Tolerance Patient tolerated treatment well;Patient limited by pain    Behavior During Therapy [facility] for tasks assessed/performed           Past Medical History:  Diagnosis Date   Allergy    Arthritis    right knee   Asthma    Constipation    Depression    GERD (gastroesophageal reflux disease)    History of kidney stones    Panic disorder    Pneumonia    Renal disorder    Seasonal allergies    SVD (spontaneous vaginal delivery)    x 3   Past Surgical History:  Procedure Laterality Date   BILATERAL SALPINGECTOMY Bilateral 05/31/2013   Procedure: BILATERAL SALPINGECTOMY;  Surgeon: Krystal Deaner, MD;  Location: [facility];  Service: Gynecology;  Laterality: Bilateral;   BREAST CYST ASPIRATION Right 2015   BREAST CYST EXCISION Right    2015   DILATION AND CURETTAGE OF UTERUS     KIDNEY STONE SURGERY     KNEE ARTHROSCOPY Right    KNEE ARTHROSCOPY WITH DRILLING/MICROFRACTURE Right 07/26/2014   Procedure: RIGHT KNEE ARTHROSCOPY WITH CHONDROPLASTY, MICROFRACTURE;  Surgeon: Kay Ozell Cummins, MD;  Location: [facility];  Service: Orthopedics;  Laterality: Right;   KNEE ARTHROSCOPY WITH MEDIAL MENISECTOMY Right 07/26/2014   Procedure: KNEE ARTHROSCOPY WITH MEDIAL MENISECTOMY;  Surgeon: Kay Ozell Cummins, MD;  Location: [facility];  Service: Orthopedics;  Laterality: Right;   KNEE ARTHROSCOPY WITH MEDIAL MENISECTOMY Left  05/07/2022   Procedure: LEFT KNEE ARTHROSCOPY WITH DEBRIDEMENT AND CHONDROPLASTY PARTIAL MEDIAL MENISCECTOMY;  Surgeon: Cummins Kay HERO, MD;  Location: [facility];  Service: Orthopedics;  Laterality: Left;   LAPAROSCOPIC ASSISTED VAGINAL HYSTERECTOMY N/A 05/31/2013   Procedure: LAPAROSCOPIC ASSISTED VAGINAL HYSTERECTOMY;  Surgeon: Krystal Deaner, MD;  Location: [facility];  Service: Gynecology;  Laterality: N/A;   LAPAROSCOPY N/A 08/26/2017   Procedure: LAPAROSCOPY OPERATIVE WITH PERIOTONEAL BIOPSY AND FULGERATION OF ENDOMETRIOSIS;  Surgeon: Deaner Krystal, MD;  Location: [facility];  Service: Gynecology;  Laterality: N/A;   LUMBAR LAMINECTOMY/DECOMPRESSION MICRODISCECTOMY Right 12/31/2018   Procedure: MICRODISCECTOMY LUMBAR 5- SACRAL1;  Surgeon: Lanis Pupa, MD;  Location: [facility];  Service: Neurosurgery;  Laterality: Right;  MICRODISCECTOMY LUMBAR 5- SACRAL1   TONSILLECTOMY     TONSILLECTOMY AND ADENOIDECTOMY     TOTAL KNEE ARTHROPLASTY Right 08/08/2019   Procedure: RIGHT TOTAL KNEE ARTHROPLASTY;  Surgeon: Cummins Kay HERO, MD;  Location: [facility];  Service: Orthopedics;  Laterality: Right;   TUBAL LIGATION     UPPER GI ENDOSCOPY     ulcer   Patient Active Problem List   Diagnosis Date Noted   Synovitis of knee 05/07/2022   Acute medial meniscus tear of left knee 04/24/2022   Lumbar radiculopathy 01/20/2020   Status post total right knee replacement 08/08/2019   Pelvic pain in female 08/26/2017  Pelvic peritoneal adhesions, female 08/26/2017   Endometriosis of pelvis 08/26/2017   Unilateral primary osteoarthritis, right knee 06/09/2017   Unilateral primary osteoarthritis, left knee 06/09/2017   Family history of breast cancer in mother 12/06/2015   Menorrhagia 05/31/2013   Dysmenorrhea 05/31/2013   Dyspareunia 05/31/2013   Asthma 08/05/2011    ONSET DATE: 12/29/23  REFERRING DIAG:  M54.81 (ICD-10-CM) - Occipital neuralgia of right side    THERAPY DIAG:   Cervicalgia  Other symptoms and signs involving the musculoskeletal system  Rationale for Evaluation and Treatment: Rehabilitation  SUBJECTIVE:                                                                                                                                                                                             SUBJECTIVE STATEMENT: Not feeling as bad, working on the stretching and feel things shifting. Been working long hours.  Pt accompanied by: self  PERTINENT HISTORY: She has pain on the right side of the back of the head. Feels stabbing and sore. She has chronic neck pain. She has decreased range of motion of the neck. Worse with movement. The stabs are better with staying still. No light or sound sensitivity, no nausea. Her fingertips started tingling on both hands. All the fingers. She hs the pain currently, a stagnant pain  PAIN:  Are you having pain? Yes: NPRS scale: 4/10 Pain location: right base of skull/neck Pain description: throbbing, fullness on the left Aggravating factors: active neck movements Relieving factors: cold, rest/inactivity  PRECAUTIONS: None  RED FLAGS: Cervical red flags: Dysphagia No, Dysmetria No, Diplopia No, Nystagmus No, and Nausea No   WEIGHT BEARING RESTRICTIONS: No  FALLS: Has patient fallen in last 6 months? No    PLOF: Independent  PATIENT GOALS: reduce neck pain  OBJECTIVE:   TODAY'S TREATMENT: 02/02/24 Activity Comments  Supine w/ MHP x 5 min   Manual therapy Soft tissue mobilization bilat cervical column to address trigger points R > L and suboccipital release.  Joint mobilizations grade 2-3 for downglide and lateral shift to facilitate lateral flexion and rotation Manual traction 10 sec on 10 sec of x 20 reps  HEP review Verbal cues for sequence and execution  2/10 neck pain post-tx            TODAY'S TREATMENT: 01/26/2024 Activity Comments  Review of HEP Min cues for technique to avoid pain with  stretching; cues to avoid tipping chin up with supine neck retraction  Supine scapular retraction x 5 reps   Manual therapy stretch and STM to occipital area, UT, levator scap  Tender, tight to palpation UT,  R>L  Seated shoulder rolls   Seated neck rotation with towel stretch, 3 x 5 sec Cues for technique; pt reports some relief      HOME EXERCISE PROGRAM: Access Code: [email or web address] URL: [email or web address] Date: 01/26/2024 Prepared by: [facility] - [facility]  Exercises - Supine Cervical Retraction with Towel  - 1 x daily - 7 x weekly - 1-3 sets - 10 reps - 2-3 hold - Cervical Extension AROM with Strap  - 1 x daily - 7 x weekly - 1-3 sets - 10 reps - Upper Cervical Extension SNAG with Strap  - 1 x daily - 7 x weekly - 1-3 sets - 10 reps - Supine Scapular Retraction  - 1 x daily - 7 x weekly - 1-2 sets - 10 reps - Shoulder Rolls in Sitting  - 1 x daily - 7 x weekly - 1-2 sets - 5-10 reps - Seated Assisted Cervical Rotation with Towel  - 1 x daily - 7 x weekly - 1 sets - 5 reps  PATIENT EDUCATION: Education details: HEP review and updates Person educated: Patient Education method: Explanation, Demonstration, and Handouts Education comprehension: verbalized understanding, returned demonstration, and needs further education  ---------------------------------------------- Note: Objective measures were completed at Evaluation unless otherwise noted.  DIAGNOSTIC FINDINGS:  IMPRESSION: 1. No acute intracranial pathology. No noncontrast CT findings to explain headache. 2. No fracture or static subluxation of the cervical spine. 3. Moderate multilevel cervical disc degenerative disease, worst from C5-C7. 4. No noncontrast CT evidence of infection in the head or neck. Note that contrast enhanced MRI would be the imaging test of choice for intracranial or cervical infection such as cerebritis, abscess, or discitis/osteomyelitis.  COGNITION: Overall  cognitive status: Within functional limits for tasks assessed   SENSATION: WFL  COORDINATION: intact  EDEMA:  None noted  MUSCLE TONE: WNL     POSTURE: No Significant postural limitations  PALPATION:  tenderness to palpation suboccipital R >L; right cervical column tenderness to palpation and trigger points throughout  Cervical ROM:  AROM Flexion: 20 deg Extension: 35 deg Right lateral: 15 deg Left lateral: 15 deg Right rotation: 35 deg Left rotation: 40 deg  PROM:  Flexion: pain at end-range Extension: NT in supine Rotation: WFL, pain at end-range Lateral flexion: WFL pain at end-range  Upper cervical rotation: WFL pain at end-range right rotation > left  UE ROM and strength WNL and 5/5 strength  Independent functional mobility GAIT: Findings: Comments: independent, no deviation appreciated    PATIENT SURVEYS:  NDI: TBD                                                                                                                              TREATMENT DATE:     PATIENT EDUCATION: Education details: assessment details, rationale of PT intervention, dry needling info Person educated: Patient Education method: Explanation and Handouts Education comprehension: verbalized understanding  HOME EXERCISE PROGRAM: Access Code: [email or web address]  URL: [email or web address] Date: 01/19/2024 Prepared by: Burnard Sandifer  Exercises - Supine Cervical Retraction with Towel  - 1 x daily - 7 x weekly - 1-3 sets - 10 reps - 2-3 hold - Cervical Extension AROM with Strap  - 1 x daily - 7 x weekly - 1-3 sets - 10 reps - Upper Cervical Extension SNAG with Strap  - 1 x daily - 7 x weekly - 1-3 sets - 10 reps  GOALS: Goals reviewed with patient? Yes  SHORT TERM GOALS: Target date: same as LTG    LONG TERM GOALS: Target date: 02/23/2024    Neck Disability Index to be performed and score accordingly Baseline: 28/50 Goal status: IN PROGRESS  2.  Patient will  be independent in HEP to improve functional outcomes Baseline:  Goal status: IN PROGRESS  3.  Pain with AROM to not exceed 3/10 to improve comfort with ADL/driving Baseline: 4-1/89 Goal status: IN PROGRESS  4.  Improve cervical AROM to WNL to improve comfort/safety with ADL/driving Baseline: see above Goal status: IN PROGRESS    ASSESSMENT:  CLINICAL IMPRESSION: Exhibits ongoing spasm/trigger points right cervical column and suboccipital addressed with STM and manual therapy to improve ROM and decrease pain. Supervision for current HEP requiring cues for sequence and form. Reports reduced pain post-tx. Continued sessions to advance POC details.  OBJECTIVE IMPAIRMENTS: decreased activity tolerance, decreased ROM, increased muscle spasms, impaired flexibility, and pain.   ACTIVITY LIMITATIONS: carrying, lifting, sleeping, reach over head, and hygiene/grooming  PARTICIPATION LIMITATIONS: meal prep, cleaning, driving, occupation, and yard work  PERSONAL FACTORS: Age, Time since onset of injury/illness/exacerbation, and 1 comorbidity: PMH are also affecting patient's functional outcome.   REHAB POTENTIAL: Good  CLINICAL DECISION MAKING: Evolving/moderate complexity  EVALUATION COMPLEXITY: Moderate  PLAN:  PT FREQUENCY: 1x/week  PT DURATION: 4 weeks  PLANNED INTERVENTIONS: 97750- Physical Performance Testing, 97110-Therapeutic exercises, 97530- Therapeutic activity, [id number]- Neuromuscular re-education, 97535- Self Care, 02859- Manual therapy, [id number]- Gait training, [phone]- Canalith repositioning, [id number]- Aquatic Therapy, H9716- Electrical stimulation (unattended), and 20560 (1-2 muscles), 20561 (3+ muscles)- Dry Needling  PLAN FOR NEXT SESSION: HEP review, manual therapy, dry needling info provided--f/u if interested   10:55 AM, 02/02/24 M. Kelly Somaya Grassi, PT, DPT Physical Therapist- [place] Office Number: [phone]     For all possible CPT codes, reference the Planned  Interventions line above.     Check all conditions that are expected to impact treatment: {Conditions expected to impact treatment:None of these apply   If treatment provided at initial evaluation, no treatment charged due to lack of authorization.

## 2024-02-05 NOTE — Therapy (Incomplete)
 OUTPATIENT PHYSICAL THERAPY NEURO TREATMENT NOTE   Patient Name: [name] [name] MRN: [id number] DOB:1980-10-10, 43 y.o., female Today's Date: 02/05/2024   PCP: Austin Mutton, MD REFERRING PROVIDER: Ines Onetha NOVAK, MD  END OF SESSION:     Past Medical History:  Diagnosis Date   Allergy    Arthritis    right knee   Asthma    Constipation    Depression    GERD (gastroesophageal reflux disease)    History of kidney stones    Panic disorder    Pneumonia    Renal disorder    Seasonal allergies    SVD (spontaneous vaginal delivery)    x 3   Past Surgical History:  Procedure Laterality Date   BILATERAL SALPINGECTOMY Bilateral 05/31/2013   Procedure: BILATERAL SALPINGECTOMY;  Surgeon: Krystal Deaner, MD;  Location: [facility];  Service: Gynecology;  Laterality: Bilateral;   BREAST CYST ASPIRATION Right 2015   BREAST CYST EXCISION Right    2015   DILATION AND CURETTAGE OF UTERUS     KIDNEY STONE SURGERY     KNEE ARTHROSCOPY Right    KNEE ARTHROSCOPY WITH DRILLING/MICROFRACTURE Right 07/26/2014   Procedure: RIGHT KNEE ARTHROSCOPY WITH CHONDROPLASTY, MICROFRACTURE;  Surgeon: Kay Ozell Cummins, MD;  Location: [facility];  Service: Orthopedics;  Laterality: Right;   KNEE ARTHROSCOPY WITH MEDIAL MENISECTOMY Right 07/26/2014   Procedure: KNEE ARTHROSCOPY WITH MEDIAL MENISECTOMY;  Surgeon: Kay Ozell Cummins, MD;  Location: [facility];  Service: Orthopedics;  Laterality: Right;   KNEE ARTHROSCOPY WITH MEDIAL MENISECTOMY Left 05/07/2022   Procedure: LEFT KNEE ARTHROSCOPY WITH DEBRIDEMENT AND CHONDROPLASTY PARTIAL MEDIAL MENISCECTOMY;  Surgeon: Cummins Kay HERO, MD;  Location: [facility];  Service: Orthopedics;  Laterality: Left;   LAPAROSCOPIC ASSISTED VAGINAL HYSTERECTOMY N/A 05/31/2013   Procedure: LAPAROSCOPIC ASSISTED VAGINAL HYSTERECTOMY;  Surgeon: Krystal Deaner, MD;  Location: [facility];  Service: Gynecology;  Laterality: N/A;   LAPAROSCOPY  N/A 08/26/2017   Procedure: LAPAROSCOPY OPERATIVE WITH PERIOTONEAL BIOPSY AND FULGERATION OF ENDOMETRIOSIS;  Surgeon: Deaner Krystal, MD;  Location: [facility];  Service: Gynecology;  Laterality: N/A;   LUMBAR LAMINECTOMY/DECOMPRESSION MICRODISCECTOMY Right 12/31/2018   Procedure: MICRODISCECTOMY LUMBAR 5- SACRAL1;  Surgeon: Lanis Pupa, MD;  Location: [facility];  Service: Neurosurgery;  Laterality: Right;  MICRODISCECTOMY LUMBAR 5- SACRAL1   TONSILLECTOMY     TONSILLECTOMY AND ADENOIDECTOMY     TOTAL KNEE ARTHROPLASTY Right 08/08/2019   Procedure: RIGHT TOTAL KNEE ARTHROPLASTY;  Surgeon: Cummins Kay HERO, MD;  Location: [facility];  Service: Orthopedics;  Laterality: Right;   TUBAL LIGATION     UPPER GI ENDOSCOPY     ulcer   Patient Active Problem List   Diagnosis Date Noted   Synovitis of knee 05/07/2022   Acute medial meniscus tear of left knee 04/24/2022   Lumbar radiculopathy 01/20/2020   Status post total right knee replacement 08/08/2019   Pelvic pain in female 08/26/2017   Pelvic peritoneal adhesions, female 08/26/2017   Endometriosis of pelvis 08/26/2017   Unilateral primary osteoarthritis, right knee 06/09/2017   Unilateral primary osteoarthritis, left knee 06/09/2017   Family history of breast cancer in mother 12/06/2015   Menorrhagia 05/31/2013   Dysmenorrhea 05/31/2013   Dyspareunia 05/31/2013   Asthma 08/05/2011    ONSET DATE: 12/29/23  REFERRING DIAG:  M54.81 (ICD-10-CM) - Occipital neuralgia of right side    THERAPY DIAG:  No diagnosis found.  Rationale for Evaluation and Treatment: Rehabilitation  SUBJECTIVE:  SUBJECTIVE STATEMENT: Not feeling as bad, working on the stretching and feel things shifting. Been working long hours.  Pt accompanied by: self  PERTINENT HISTORY: She has  pain on the right side of the back of the head. Feels stabbing and sore. She has chronic neck pain. She has decreased range of motion of the neck. Worse with movement. The stabs are better with staying still. No light or sound sensitivity, no nausea. Her fingertips started tingling on both hands. All the fingers. She hs the pain currently, a stagnant pain  PAIN:  Are you having pain? Yes: NPRS scale: 4/10 Pain location: right base of skull/neck Pain description: throbbing, fullness on the left Aggravating factors: active neck movements Relieving factors: cold, rest/inactivity  PRECAUTIONS: None  RED FLAGS: Cervical red flags: Dysphagia No, Dysmetria No, Diplopia No, Nystagmus No, and Nausea No   WEIGHT BEARING RESTRICTIONS: No  FALLS: Has patient fallen in last 6 months? No    PLOF: Independent  PATIENT GOALS: reduce neck pain  OBJECTIVE:     TODAY'S TREATMENT: 02/08/24 Activity Comments                       TODAY'S TREATMENT: 02/02/24 Activity Comments  Supine w/ MHP x 5 min   Manual therapy Soft tissue mobilization bilat cervical column to address trigger points R > L and suboccipital release.  Joint mobilizations grade 2-3 for downglide and lateral shift to facilitate lateral flexion and rotation Manual traction 10 sec on 10 sec of x 20 reps  HEP review Verbal cues for sequence and execution  2/10 neck pain post-tx            TODAY'S TREATMENT: 01/26/2024 Activity Comments  Review of HEP Min cues for technique to avoid pain with stretching; cues to avoid tipping chin up with supine neck retraction  Supine scapular retraction x 5 reps   Manual therapy stretch and STM to occipital area, UT, levator scap  Tender, tight to palpation UT, R>L  Seated shoulder rolls   Seated neck rotation with towel stretch, 3 x 5 sec Cues for technique; pt reports some relief      HOME EXERCISE PROGRAM: Access Code: [email or web address] URL: [email or web address] Date:  01/26/2024 Prepared by: [facility] - Outpatient  Rehab - [facility]  Exercises - Supine Cervical Retraction with Towel  - 1 x daily - 7 x weekly - 1-3 sets - 10 reps - 2-3 hold - Cervical Extension AROM with Strap  - 1 x daily - 7 x weekly - 1-3 sets - 10 reps - Upper Cervical Extension SNAG with Strap  - 1 x daily - 7 x weekly - 1-3 sets - 10 reps - Supine Scapular Retraction  - 1 x daily - 7 x weekly - 1-2 sets - 10 reps - Shoulder Rolls in Sitting  - 1 x daily - 7 x weekly - 1-2 sets - 5-10 reps - Seated Assisted Cervical Rotation with Towel  - 1 x daily - 7 x weekly - 1 sets - 5 reps  PATIENT EDUCATION: Education details: HEP review and updates Person educated: Patient Education method: Explanation, Demonstration, and Handouts Education comprehension: verbalized understanding, returned demonstration, and needs further education  ---------------------------------------------- Note: Objective measures were completed at Evaluation unless otherwise noted.  DIAGNOSTIC FINDINGS:  IMPRESSION: 1. No acute intracranial pathology. No noncontrast CT findings to explain headache. 2. No fracture or static subluxation of the cervical spine. 3. Moderate multilevel cervical disc degenerative disease, worst  from C5-C7. 4. No noncontrast CT evidence of infection in the head or neck. Note that contrast enhanced MRI would be the imaging test of choice for intracranial or cervical infection such as cerebritis, abscess, or discitis/osteomyelitis.  COGNITION: Overall cognitive status: Within functional limits for tasks assessed   SENSATION: WFL  COORDINATION: intact  EDEMA:  None noted  MUSCLE TONE: WNL     POSTURE: No Significant postural limitations  PALPATION:  tenderness to palpation suboccipital R >L; right cervical column tenderness to palpation and trigger points throughout  Cervical ROM:  AROM Flexion: 20 deg Extension: 35 deg Right lateral: 15 deg Left lateral:  15 deg Right rotation: 35 deg Left rotation: 40 deg  PROM:  Flexion: pain at end-range Extension: NT in supine Rotation: WFL, pain at end-range Lateral flexion: WFL pain at end-range  Upper cervical rotation: WFL pain at end-range right rotation > left  UE ROM and strength WNL and 5/5 strength  Independent functional mobility GAIT: Findings: Comments: independent, no deviation appreciated    PATIENT SURVEYS:  NDI: TBD                                                                                                                              TREATMENT DATE:     PATIENT EDUCATION: Education details: assessment details, rationale of PT intervention, dry needling info Person educated: Patient Education method: Explanation and Handouts Education comprehension: verbalized understanding  HOME EXERCISE PROGRAM: Access Code: [email or web address] URL: [email or web address] Date: 01/19/2024 Prepared by: Burnard Sandifer  Exercises - Supine Cervical Retraction with Towel  - 1 x daily - 7 x weekly - 1-3 sets - 10 reps - 2-3 hold - Cervical Extension AROM with Strap  - 1 x daily - 7 x weekly - 1-3 sets - 10 reps - Upper Cervical Extension SNAG with Strap  - 1 x daily - 7 x weekly - 1-3 sets - 10 reps  GOALS: Goals reviewed with patient? Yes  SHORT TERM GOALS: Target date: same as LTG    LONG TERM GOALS: Target date: 02/23/2024    Neck Disability Index to be performed and score accordingly Baseline: 28/50 Goal status: IN PROGRESS  2.  Patient will be independent in HEP to improve functional outcomes Baseline:  Goal status: IN PROGRESS  3.  Pain with AROM to not exceed 3/10 to improve comfort with ADL/driving Baseline: 4-1/89 Goal status: IN PROGRESS  4.  Improve cervical AROM to WNL to improve comfort/safety with ADL/driving Baseline: see above Goal status: IN PROGRESS    ASSESSMENT:  CLINICAL IMPRESSION: Exhibits ongoing spasm/trigger points right cervical  column and suboccipital addressed with STM and manual therapy to improve ROM and decrease pain. Supervision for current HEP requiring cues for sequence and form. Reports reduced pain post-tx. Continued sessions to advance POC details.  OBJECTIVE IMPAIRMENTS: decreased activity tolerance, decreased ROM, increased muscle spasms, impaired flexibility, and pain.   ACTIVITY LIMITATIONS: carrying, lifting, sleeping, reach over head, and  hygiene/grooming  PARTICIPATION LIMITATIONS: meal prep, cleaning, driving, occupation, and yard work  PERSONAL FACTORS: Age, Time since onset of injury/illness/exacerbation, and 1 comorbidity: PMH are also affecting patient's functional outcome.   REHAB POTENTIAL: Good  CLINICAL DECISION MAKING: Evolving/moderate complexity  EVALUATION COMPLEXITY: Moderate  PLAN:  PT FREQUENCY: 1x/week  PT DURATION: 4 weeks  PLANNED INTERVENTIONS: 97750- Physical Performance Testing, 97110-Therapeutic exercises, 97530- Therapeutic activity, [id number]- Neuromuscular re-education, 97535- Self Care, 02859- Manual therapy, [id number]- Gait training, [phone]- Canalith repositioning, [id number]- Aquatic Therapy, [id number]- Electrical stimulation (unattended), and 20560 (1-2 muscles), 20561 (3+ muscles)- Dry Needling  PLAN FOR NEXT SESSION: HEP review, manual therapy, dry needling info provided--f/u if interested

## 2024-02-08 ENCOUNTER — Ambulatory Visit: Payer: MEDICAID | Admitting: Physical Therapy

## 2024-02-12 ENCOUNTER — Ambulatory Visit: Payer: MEDICAID | Admitting: Physical Therapy

## 2024-02-12 NOTE — Therapy (Incomplete)
 OUTPATIENT PHYSICAL THERAPY NEURO TREATMENT NOTE   Patient Name: [name] [name] MRN: [id number] DOB:Sep 08, 1980, 43 y.o., female Today's Date: 02/12/2024   PCP: Austin Mutton, MD REFERRING PROVIDER: Ines Onetha NOVAK, MD  END OF SESSION:     Past Medical History:  Diagnosis Date   Allergy    Arthritis    right knee   Asthma    Constipation    Depression    GERD (gastroesophageal reflux disease)    History of kidney stones    Panic disorder    Pneumonia    Renal disorder    Seasonal allergies    SVD (spontaneous vaginal delivery)    x 3   Past Surgical History:  Procedure Laterality Date   BILATERAL SALPINGECTOMY Bilateral 05/31/2013   Procedure: BILATERAL SALPINGECTOMY;  Surgeon: Krystal Deaner, MD;  Location: [facility];  Service: Gynecology;  Laterality: Bilateral;   BREAST CYST ASPIRATION Right 2015   BREAST CYST EXCISION Right    2015   DILATION AND CURETTAGE OF UTERUS     KIDNEY STONE SURGERY     KNEE ARTHROSCOPY Right    KNEE ARTHROSCOPY WITH DRILLING/MICROFRACTURE Right 07/26/2014   Procedure: RIGHT KNEE ARTHROSCOPY WITH CHONDROPLASTY, MICROFRACTURE;  Surgeon: Kay Ozell Cummins, MD;  Location: [facility];  Service: Orthopedics;  Laterality: Right;   KNEE ARTHROSCOPY WITH MEDIAL MENISECTOMY Right 07/26/2014   Procedure: KNEE ARTHROSCOPY WITH MEDIAL MENISECTOMY;  Surgeon: Kay Ozell Cummins, MD;  Location: [facility];  Service: Orthopedics;  Laterality: Right;   KNEE ARTHROSCOPY WITH MEDIAL MENISECTOMY Left 05/07/2022   Procedure: LEFT KNEE ARTHROSCOPY WITH DEBRIDEMENT AND CHONDROPLASTY PARTIAL MEDIAL MENISCECTOMY;  Surgeon: Cummins Kay HERO, MD;  Location: [facility];  Service: Orthopedics;  Laterality: Left;   LAPAROSCOPIC ASSISTED VAGINAL HYSTERECTOMY N/A 05/31/2013   Procedure: LAPAROSCOPIC ASSISTED VAGINAL HYSTERECTOMY;  Surgeon: Krystal Deaner, MD;  Location: [facility];  Service: Gynecology;  Laterality: N/A;   LAPAROSCOPY  N/A 08/26/2017   Procedure: LAPAROSCOPY OPERATIVE WITH PERIOTONEAL BIOPSY AND FULGERATION OF ENDOMETRIOSIS;  Surgeon: Deaner Krystal, MD;  Location: [facility];  Service: Gynecology;  Laterality: N/A;   LUMBAR LAMINECTOMY/DECOMPRESSION MICRODISCECTOMY Right 12/31/2018   Procedure: MICRODISCECTOMY LUMBAR 5- SACRAL1;  Surgeon: Lanis Pupa, MD;  Location: [facility];  Service: Neurosurgery;  Laterality: Right;  MICRODISCECTOMY LUMBAR 5- SACRAL1   TONSILLECTOMY     TONSILLECTOMY AND ADENOIDECTOMY     TOTAL KNEE ARTHROPLASTY Right 08/08/2019   Procedure: RIGHT TOTAL KNEE ARTHROPLASTY;  Surgeon: Cummins Kay HERO, MD;  Location: [facility];  Service: Orthopedics;  Laterality: Right;   TUBAL LIGATION     UPPER GI ENDOSCOPY     ulcer   Patient Active Problem List   Diagnosis Date Noted   Synovitis of knee 05/07/2022   Acute medial meniscus tear of left knee 04/24/2022   Lumbar radiculopathy 01/20/2020   Status post total right knee replacement 08/08/2019   Pelvic pain in female 08/26/2017   Pelvic peritoneal adhesions, female 08/26/2017   Endometriosis of pelvis 08/26/2017   Unilateral primary osteoarthritis, right knee 06/09/2017   Unilateral primary osteoarthritis, left knee 06/09/2017   Family history of breast cancer in mother 12/06/2015   Menorrhagia 05/31/2013   Dysmenorrhea 05/31/2013   Dyspareunia 05/31/2013   Asthma 08/05/2011    ONSET DATE: 12/29/23  REFERRING DIAG:  M54.81 (ICD-10-CM) - Occipital neuralgia of right side    THERAPY DIAG:  No diagnosis found.  Rationale for Evaluation and Treatment: Rehabilitation  SUBJECTIVE:  SUBJECTIVE STATEMENT: Not feeling as bad, working on the stretching and feel things shifting. Been working long hours.  Pt accompanied by: self  PERTINENT HISTORY: She has  pain on the right side of the back of the head. Feels stabbing and sore. She has chronic neck pain. She has decreased range of motion of the neck. Worse with movement. The stabs are better with staying still. No light or sound sensitivity, no nausea. Her fingertips started tingling on both hands. All the fingers. She hs the pain currently, a stagnant pain  PAIN:  Are you having pain? Yes: NPRS scale: 4/10 Pain location: right base of skull/neck Pain description: throbbing, fullness on the left Aggravating factors: active neck movements Relieving factors: cold, rest/inactivity  PRECAUTIONS: None  RED FLAGS: Cervical red flags: Dysphagia No, Dysmetria No, Diplopia No, Nystagmus No, and Nausea No   WEIGHT BEARING RESTRICTIONS: No  FALLS: Has patient fallen in last 6 months? No    PLOF: Independent  PATIENT GOALS: reduce neck pain  OBJECTIVE:     TODAY'S TREATMENT: 02/15/24 Activity Comments                       TODAY'S TREATMENT: 02/02/24 Activity Comments  Supine w/ MHP x 5 min   Manual therapy Soft tissue mobilization bilat cervical column to address trigger points R > L and suboccipital release.  Joint mobilizations grade 2-3 for downglide and lateral shift to facilitate lateral flexion and rotation Manual traction 10 sec on 10 sec of x 20 reps  HEP review Verbal cues for sequence and execution  2/10 neck pain post-tx            TODAY'S TREATMENT: 01/26/2024 Activity Comments  Review of HEP Min cues for technique to avoid pain with stretching; cues to avoid tipping chin up with supine neck retraction  Supine scapular retraction x 5 reps   Manual therapy stretch and STM to occipital area, UT, levator scap  Tender, tight to palpation UT, R>L  Seated shoulder rolls   Seated neck rotation with towel stretch, 3 x 5 sec Cues for technique; pt reports some relief      HOME EXERCISE PROGRAM: Access Code: [email or web address] URL: [email or web address] Date:  01/26/2024 Prepared by: [facility] - Outpatient  Rehab - [facility]  Exercises - Supine Cervical Retraction with Towel  - 1 x daily - 7 x weekly - 1-3 sets - 10 reps - 2-3 hold - Cervical Extension AROM with Strap  - 1 x daily - 7 x weekly - 1-3 sets - 10 reps - Upper Cervical Extension SNAG with Strap  - 1 x daily - 7 x weekly - 1-3 sets - 10 reps - Supine Scapular Retraction  - 1 x daily - 7 x weekly - 1-2 sets - 10 reps - Shoulder Rolls in Sitting  - 1 x daily - 7 x weekly - 1-2 sets - 5-10 reps - Seated Assisted Cervical Rotation with Towel  - 1 x daily - 7 x weekly - 1 sets - 5 reps  PATIENT EDUCATION: Education details: HEP review and updates Person educated: Patient Education method: Explanation, Demonstration, and Handouts Education comprehension: verbalized understanding, returned demonstration, and needs further education  ---------------------------------------------- Note: Objective measures were completed at Evaluation unless otherwise noted.  DIAGNOSTIC FINDINGS:  IMPRESSION: 1. No acute intracranial pathology. No noncontrast CT findings to explain headache. 2. No fracture or static subluxation of the cervical spine. 3. Moderate multilevel cervical disc degenerative disease, worst  from C5-C7. 4. No noncontrast CT evidence of infection in the head or neck. Note that contrast enhanced MRI would be the imaging test of choice for intracranial or cervical infection such as cerebritis, abscess, or discitis/osteomyelitis.  COGNITION: Overall cognitive status: Within functional limits for tasks assessed   SENSATION: WFL  COORDINATION: intact  EDEMA:  None noted  MUSCLE TONE: WNL     POSTURE: No Significant postural limitations  PALPATION:  tenderness to palpation suboccipital R >L; right cervical column tenderness to palpation and trigger points throughout  Cervical ROM:  AROM Flexion: 20 deg Extension: 35 deg Right lateral: 15 deg Left lateral:  15 deg Right rotation: 35 deg Left rotation: 40 deg  PROM:  Flexion: pain at end-range Extension: NT in supine Rotation: WFL, pain at end-range Lateral flexion: WFL pain at end-range  Upper cervical rotation: WFL pain at end-range right rotation > left  UE ROM and strength WNL and 5/5 strength  Independent functional mobility GAIT: Findings: Comments: independent, no deviation appreciated    PATIENT SURVEYS:  NDI: TBD                                                                                                                              TREATMENT DATE:     PATIENT EDUCATION: Education details: assessment details, rationale of PT intervention, dry needling info Person educated: Patient Education method: Explanation and Handouts Education comprehension: verbalized understanding  HOME EXERCISE PROGRAM: Access Code: [email or web address] URL: [email or web address] Date: 01/19/2024 Prepared by: Burnard Sandifer  Exercises - Supine Cervical Retraction with Towel  - 1 x daily - 7 x weekly - 1-3 sets - 10 reps - 2-3 hold - Cervical Extension AROM with Strap  - 1 x daily - 7 x weekly - 1-3 sets - 10 reps - Upper Cervical Extension SNAG with Strap  - 1 x daily - 7 x weekly - 1-3 sets - 10 reps  GOALS: Goals reviewed with patient? Yes  SHORT TERM GOALS: Target date: same as LTG    LONG TERM GOALS: Target date: 02/23/2024    Neck Disability Index to be performed and score accordingly Baseline: 28/50 Goal status: IN PROGRESS  2.  Patient will be independent in HEP to improve functional outcomes Baseline:  Goal status: IN PROGRESS  3.  Pain with AROM to not exceed 3/10 to improve comfort with ADL/driving Baseline: 4-1/89 Goal status: IN PROGRESS  4.  Improve cervical AROM to WNL to improve comfort/safety with ADL/driving Baseline: see above Goal status: IN PROGRESS    ASSESSMENT:  CLINICAL IMPRESSION: Exhibits ongoing spasm/trigger points right cervical  column and suboccipital addressed with STM and manual therapy to improve ROM and decrease pain. Supervision for current HEP requiring cues for sequence and form. Reports reduced pain post-tx. Continued sessions to advance POC details.  OBJECTIVE IMPAIRMENTS: decreased activity tolerance, decreased ROM, increased muscle spasms, impaired flexibility, and pain.   ACTIVITY LIMITATIONS: carrying, lifting, sleeping, reach over head, and  hygiene/grooming  PARTICIPATION LIMITATIONS: meal prep, cleaning, driving, occupation, and yard work  PERSONAL FACTORS: Age, Time since onset of injury/illness/exacerbation, and 1 comorbidity: PMH are also affecting patient's functional outcome.   REHAB POTENTIAL: Good  CLINICAL DECISION MAKING: Evolving/moderate complexity  EVALUATION COMPLEXITY: Moderate  PLAN:  PT FREQUENCY: 1x/week  PT DURATION: 4 weeks  PLANNED INTERVENTIONS: 97750- Physical Performance Testing, 97110-Therapeutic exercises, 97530- Therapeutic activity, [id number]- Neuromuscular re-education, 97535- Self Care, 02859- Manual therapy, [id number]- Gait training, [phone]- Canalith repositioning, [id number]- Aquatic Therapy, [id number]- Electrical stimulation (unattended), and 20560 (1-2 muscles), [id number] (3+ muscles)- Dry Needling  PLAN FOR NEXT SESSION: HEP review, manual therapy, dry needling info provided--f/u if interested

## 2024-02-15 ENCOUNTER — Ambulatory Visit: Payer: MEDICAID | Attending: Neurology | Admitting: Physical Therapy

## 2024-03-21 ENCOUNTER — Other Ambulatory Visit: Payer: Self-pay | Admitting: Medical Genetics

## 2024-05-09 ENCOUNTER — Encounter: Payer: Self-pay | Admitting: Radiology

## 2024-06-20 ENCOUNTER — Encounter: Payer: Self-pay | Admitting: Cardiology

## 2024-06-20 ENCOUNTER — Ambulatory Visit: Payer: MEDICAID | Admitting: Cardiology

## 2024-06-20 ENCOUNTER — Ambulatory Visit: Payer: MEDICAID

## 2024-06-20 VITALS — BP 118/80 | HR 80 | Ht 62.0 in | Wt 102.0 lb

## 2024-06-20 DIAGNOSIS — R002 Palpitations: Secondary | ICD-10-CM

## 2024-06-20 DIAGNOSIS — M940 Chondrocostal junction syndrome [Tietze]: Secondary | ICD-10-CM | POA: Diagnosis present

## 2024-06-20 NOTE — Progress Notes (Signed)
 Cardiology Office Note:  .   Date:  06/21/2024  ID:  [name] [name], DOB 04-29-1981, MRN [id number] PCP: [name] Mutton, MD  [facility] Providers Cardiologist:  [name] Clay, MD     Chief Complaint  Patient presents with   New Patient (Initial Visit)    Palpitations, chest pain    Patient Profile: .     [name] [name] is a very anxious 43 y.o. female with a PMH notable for anxiety, depression, allergies, and asthma who presents here for evaluation of chest pain and palpitations at the request of [name] Mutton, MD.  PMH: MDD with GAD ADHD Asthma HLD GERD (now on PPI-increase of PCP up to twice daily pantoprazole  40 mg daily and famotidine  40 mg every afternoon). Migraines Seasonal allergies     [name] [name] was seen on June 13, 2024 by her PCP.  Not sure where symptoms were listed but PPI wasincreased to be twice daily along with H2 blocker.  Further allergy medicine and then plan was ENT evaluation due to sore throat recurrent sinusitis symptoms.  Subjective  Discussed the use of AI scribe software for clinical note transcription with the patient, who gave verbal consent to proceed.  History of Present Illness [name] [name] is a 43 year old female with anxiety and asthma who presents with chest tightness and palpitations.  She experiences chest tightness and palpitations almost daily for the past two months. The chest tightness occurs even when sitting still and worsens when she is not occupied. Dizziness accompanies these episodes, and her chest feels sore, particularly after the tightness. Symptoms are less pronounced when she is active.  In June, she had a significant episode where she passed out in a car, leading to an emergency room visit. During this episode, she felt her heart slow down, then heat up, and start beating rapidly before losing consciousness. 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  She has a history of anxiety and depression, and notes that the episodes of chest tightness and palpitations often occur without any apparent trigger, which she finds distressing. She also reports feeling worn out and lacking energy.  An EKG performed a couple of weeks ago showed a PR interval of 116 ms. Her coronary calcium score from October 2023 was zero.  Her current medications include an Advair inhaler, a rescue inhaler, Concerta for ADHD, and pantoprazole  for stomach issues. She has not used methylprednisolone  since June.  Family history is notable for her father having a heart attack at age 34, who was a smoker. She is reducing her smoking habit, currently down to two cigarettes a day.   Cardiovascular ROS: positive for - chest pain, palpitations, rapid heart rate, and fatigue, anxiety, rapid heart rates and dizziness negative for - dyspnea on exertion, edema, orthopnea, paroxysmal nocturnal dyspnea, or syncope, TIA or amaurosis fugax, claudication  ROS:  Review of Systems - Negative except noted aboveshe did have 1 syncopal episode over the summer but [place].  Does not have any issues since.    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  Tonsillectomy in July 2014  Medications: Wellbutrin  300 mg daily, BuSpar  10 mg 3 times daily, Flexeril  10 mg 2 times a day as needed, Advair once daily, as needed Vicodin 5-325 mg up to 4 times daily as needed, Robaxin   750 mg every 8 hours as needed.  Allergies listed: Morphine (vomiting) Cymbalta (vomiting) Macrobid (hives), Zoloft (vomiting )  Social History: Former smoker (quit in February 2025).  Single. Family History: Father had MI at 84; mother had DM-2, depression, breast cancer, hyperlipidemia and hypertension.  Sister also had anxiety and depression.  Daughter has asthma.  Studies  Reviewed: SABRA   EKG Interpretation Date/Time:  Monday June 20 2024 09:13:44 EST Ventricular Rate:  80 PR Interval:  116 QRS Duration:  76 QT Interval:  378 QTC Calculation: 435 R Axis:   78  Text Interpretation: Normal sinus rhythm Normal ECG When compared with ECG of 29-Dec-2023 13:21, No significant change was found Confirmed by Anner Lenis ([id number]) on 06/20/2024 9:29:36 AM    Labs from PCP dated 06/13/2024: WBC 4.1, Hgb 13.0, PLT 219.  NA 140, K3.8, BUN 17,Cr 0.9.  Albumin 4.1 normal LFT TC 166, TG 54, HDL 58, LDL 97.  Hgb 5.8 TSH 0.79, T3 81.69, free T41.2. Results Radiology Coronary calcium score (04/2022): Score zero  Diagnostic EKG report from 06/10/2024: Heart rate 77 bpm.  PR interval 60/85, QT 360, QTc 409.  Normal axis.  Suggested short PR preliminary read.  Similar to PRN findings found today.  Relatively normal.  No preexcitation.  Ambulatory Cardiac Monitoring Patch Placement Patch monitor applied to anterior chest wall for ambulatory cardiac rhythm monitoring. CT angiogram neck and head.  No large vessel occlusion.  No hemodynamically significant stenosis or other acute vascular abnormalities.  No sign of bleeding.  Risk Assessment/Calculations:              Physical Exam:   VS:  BP 118/80   Pulse 80   Ht 5' 2 (1.575 m)   Wt 102 lb (46.3 kg)   LMP 05/09/2013   SpO2 98%   BMI 18.66 kg/m    Wt Readings from Last 3 Encounters:  06/20/24 102 lb (46.3 kg)  12/31/23 96 lb 12.8 oz (43.9 kg)  12/29/23 98 lb 1.7 oz (44.5 kg)      GEN: Somewhat thin, but mostly well nourished, well developed in no acute distress; very anxious, stressed. NECK: No JVD; No carotid bruits CARDIAC: Normal S1, S2; RRR, no murmurs, rubs, gallops; notable chest wall tenderness along the 3rd and 4th ribs across the costochondral margin right lateral to the sternum on the left.  Notably reproduces her symptoms. RESPIRATORY:  Clear to auscultation without rales, wheezing or rhonchi ;  nonlabored, good air movement. ABDOMEN: Soft, non-tender, non-distended EXTREMITIES:  No edema; No deformity      ASSESSMENT AND PLAN: .    Problem List Items Addressed This Visit     Costochondritis    Costochondritis Chest pain localized to third and fourth rib area, consistent with costochondritis due to rib cartilage inflammation. Symptoms not cardiac in origin. - Recommended Tylenol  or Motrin for pain management, with tapering schedule for severe flares. - Advised on proper positioning to avoid exacerbating pain. - Suggested use of lidocaine  patches for immediate pain relief.    Please take  the following with a glass of water and eat something Take Ibuprofen taper dose ( 200 mg tablets)  600 mg  ( 3 tablets)three times a day for 2 days 400  mg (2 tablets)   three times a day for 2 days 200 mg ( 1 tablet) three times a day for 2 days THEN STOP       Palpitations - Primary   Intermittent palpitations with chest tightness and  dizziness, primarily at rest. EKG shows short PR interval, not indicative of Wolf-Parkinson-White syndrome. Coronary calcium score is zero, reducing likelihood of coronary artery disease. Symptoms may be related to anxiety or costochondritis. - Ordered 14-day patch monitor to assess heart rhythm during symptomatic episodes. - Scheduled follow-up in six weeks to review monitor results. - Advised on biofeedback techniques to manage anxiety-related palpitations.      Relevant Orders   EKG 12-Lead (Completed)   LONG TERM MONITOR (3-14 DAYS)            Follow-Up: Return in about 2 months (around 08/21/2024) for Routine Follow-up after testing ~ 1-2 months, To discuss test results, Followup with Telemedicine.  I spent 49 minutes in the care of [name] [name] today including reviewing outside labs from scanned in from PCP (2 minutes), reviewing studies (Coronary Calcium Score reviewed along withAbdomen as well as head and neck CT scans-7 minutes),  face to face time discussing treatment options (24), reviewing records from PCPs note (3 minutes), 13 minutes dictating, and documenting in the encounter.      Signed, [name] MICAEL Clay, MD, MS [name] [name], M.D., M.S. Interventional Cardiologist  [facility] Pager # [phone]

## 2024-06-20 NOTE — Progress Notes (Unsigned)
 Enrolled patient for a 14 day Zio XT  monitor to be mailed to patients home

## 2024-06-20 NOTE — Patient Instructions (Signed)
 Medication Instructions:   Please take  the following with a glass of water and eat something Take Ibuprofen taper dose ( 200 mg tablets)  600 mg  ( 3 tablets)three times a day for 2 days 400  mg (2 tablets)   three times a day for 2 days 200 mg ( 1 tablet) three times a day for 2 days THEN STOP   *If you need a refill on your cardiac medications before your next appointment, please call your pharmacy*   Lab Work: Not needed If you have labs (blood work) drawn today and your tests are completely normal, you will receive your results only by: MyChart Message (if you have MyChart) OR A paper copy in the mail If you have any lab test that is abnormal or we need to change your treatment, we will call you to review the results.   Testing/Procedures:  Your physician has recommended that you wear a holter monitor -14 day Zio . Holter monitors are medical devices that record the hearts electrical activity. Doctors most often use these monitors to diagnose arrhythmias. Arrhythmias are problems with the speed or rhythm of the heartbeat. The monitor is a small, portable device. You can wear one while you do your normal daily activities. This is usually used to diagnose what is causing palpitations/syncope (passing out).   Follow-Up: At [facility], you and your health needs are our priority.  As part of our continuing mission to provide you with exceptional heart care, we have created designated Provider Care Teams.  These Care Teams include your primary Cardiologist (physician) and Advanced Practice Providers (APPs -  Physician Assistants and Nurse Practitioners) who all work together to provide you with the care you need, when you need it.     Your next appointment:   2 month(s)  The format for your next appointment:   Virtual Visit   Provider:   Alm Clay, MD   Other Instructions  ZIO XT- Long Term Monitor Instructions  Your physician has requested you wear a ZIO patch  monitor for 14 days.  This is a single patch monitor. [employer] supplies one patch monitor per enrollment. Additional stickers are not available. Please do not apply patch if you will be having a Nuclear Stress Test,  Echocardiogram, Cardiac CT, MRI, or Chest Xray during the period you would be wearing the  monitor. The patch cannot be worn during these tests. You cannot remove and re-apply the  ZIO XT patch monitor.  Your ZIO patch monitor will be mailed 3 day USPS to your address on file. It may take 3-5 days  to receive your monitor after you have been enrolled.  Once you have received your monitor, please review the enclosed instructions. Your monitor  has already been registered assigning a specific monitor serial # to you.  Billing and Patient Assistance Program Information  We have supplied [employer] with any of your insurance information on file for billing purposes. [employer] offers a sliding scale Patient Assistance Program for patients that do not have  insurance, or whose insurance does not completely cover the cost of the ZIO monitor.  You must apply for the Patient Assistance Program to qualify for this discounted rate.  To apply, please call [employer] at [phone], select option 4, select option 2, ask to apply for  Patient Assistance Program. Meredeth will ask your household income, and how many people  are in your household. They will quote your out-of-pocket cost based on that information.  [employer]  will also be able to set up a 73-month, interest-free payment plan if needed.  Applying the monitor   Shave hair from upper left chest.  Hold abrader disc by orange tab. Rub abrader in 40 strokes over the upper left chest as  indicated in your monitor instructions.  Clean area with 4 enclosed alcohol pads. Let dry.  Apply patch as indicated in monitor instructions. Patch will be placed under collarbone on left  side of chest with arrow pointing upward.  Rub patch adhesive wings  for 2 minutes. Remove white label marked 1. Remove the white  label marked 2. Rub patch adhesive wings for 2 additional minutes.  While looking in a mirror, press and release button in center of patch. A small green light will  flash 3-4 times. This will be your only indicator that the monitor has been turned on.  Do not shower for the first 24 hours. You may shower after the first 24 hours.  Press the button if you feel a symptom. You will hear a small click. Record Date, Time and  Symptom in the Patient Logbook.  When you are ready to remove the patch, follow instructions on the last 2 pages of Patient  Logbook. Stick patch monitor onto the last page of Patient Logbook.  Place Patient Logbook in the blue and white box. Use locking tab on box and tape box closed  securely. The blue and white box has prepaid postage on it. Please place it in the mailbox as  soon as possible. Your physician should have your test results approximately 7 days after the  monitor has been mailed back to [facility].  Call [facility] Customer Care at [phone] if you have questions regarding  your ZIO XT patch monitor. Call them immediately if you see an orange light blinking on your  monitor.  If your monitor falls off in less than 4 days, contact our Monitor department at [phone].  If your monitor becomes loose or falls off after 4 days call [employer] at [phone] for  suggestions on securing your monitor

## 2024-06-20 NOTE — Assessment & Plan Note (Signed)
 Intermittent palpitations with chest tightness and dizziness, primarily at rest. EKG shows short PR interval, not indicative of Wolf-Parkinson-White syndrome. Coronary calcium score is zero, reducing likelihood of coronary artery disease. Symptoms may be related to anxiety or costochondritis. - Ordered 14-day patch monitor to assess heart rhythm during symptomatic episodes. - Scheduled follow-up in six weeks to review monitor results. - Advised on biofeedback techniques to manage anxiety-related palpitations.

## 2024-06-21 NOTE — Assessment & Plan Note (Signed)
°  Costochondritis Chest pain localized to third and fourth rib area, consistent with costochondritis due to rib cartilage inflammation. Symptoms not cardiac in origin. - Recommended Tylenol  or Motrin for pain management, with tapering schedule for severe flares. - Advised on proper positioning to avoid exacerbating pain. - Suggested use of lidocaine  patches for immediate pain relief.    Please take  the following with a glass of water and eat something Take Ibuprofen taper dose ( 200 mg tablets)  600 mg  ( 3 tablets)three times a day for 2 days 400  mg (2 tablets)   three times a day for 2 days 200 mg ( 1 tablet) three times a day for 2 days THEN STOP

## 2024-08-12 ENCOUNTER — Ambulatory Visit: Payer: Self-pay

## 2024-08-12 NOTE — Telephone Encounter (Signed)
 Pt called trying to schedule an acute appt with [facility] for her symptoms of sinus pain, cough, and yellow sputum. Pt was informed she can schedule a new pt appt at [facility] in mid March, and try to go to [facility] or contact her current PCP's office for care in the mean time. Pt stated you don't do walk ins there? This RN said no, then offered to schedule a new pt appt in March which the pt declined. Pt stated she will call her current PCP to try and make an appt today. Pt instructed to call back if anything changes or worsens, pt agreeable.   FYI Only or Action Required?: FYI only for provider: pt to seek care with her PCP.   Called Nurse Triage reporting Sinusitis, Cough, and Nasal Congestion.  Symptoms began a week ago.  Interventions attempted: OTC medications: mucinex.  Symptoms are: unchanged.  Triage Disposition: See Physician Within 24 Hours  Patient/caregiver understands and will follow disposition?: Yes      Reason for Triage:  For 1 week she has had a cough, thick green mucus, had small fevers on/off this week, headache and neck pain, congestion, chest hurt from coughing - She would be a new patient  Reason for Disposition  Earache  Answer Assessment - Initial Assessment Questions 1. LOCATION: Where does it hurt?      Cheekbones and top of my head  2. ONSET: When did the sinus pain start?  (e.g., hours, days)      Week  6. NASAL DISCHARGE: Do you have discharge from your nose? If so ask, What color?     A whole bunch of thick mucus, some of it's green  7. FEVER: Do you have a fever? If Yes, ask: What is it, how was it measured, and when did it start?      Low grade per pt  8. OTHER SYMPTOMS: Do you have any other symptoms? (e.g., sore throat, cough, earache, difficulty breathing) Congestion, cough, sneeze  Protocols used: Sinus Pain or Congestion-A-AH

## 2024-08-15 ENCOUNTER — Ambulatory Visit: Payer: MEDICAID | Admitting: Cardiology
